# Patient Record
Sex: Male | Born: 1970 | Race: White | Hispanic: No | Smoking: Current every day smoker
Health system: Southern US, Community
[De-identification: ages and names within clinical notes are randomized; demographics above are authoritative.]

## PROBLEM LIST (undated history)

## (undated) DIAGNOSIS — E119 Type 2 diabetes mellitus without complications: Secondary | ICD-10-CM

## (undated) DIAGNOSIS — D649 Anemia, unspecified: Secondary | ICD-10-CM

## (undated) DIAGNOSIS — I10 Essential (primary) hypertension: Secondary | ICD-10-CM

## (undated) DIAGNOSIS — I219 Acute myocardial infarction, unspecified: Secondary | ICD-10-CM

## (undated) DIAGNOSIS — N529 Male erectile dysfunction, unspecified: Secondary | ICD-10-CM

## (undated) DIAGNOSIS — E785 Hyperlipidemia, unspecified: Secondary | ICD-10-CM

## (undated) DIAGNOSIS — Z7902 Long term (current) use of antithrombotics/antiplatelets: Secondary | ICD-10-CM

## (undated) DIAGNOSIS — S0990XA Unspecified injury of head, initial encounter: Secondary | ICD-10-CM

## (undated) DIAGNOSIS — F339 Major depressive disorder, recurrent, unspecified: Secondary | ICD-10-CM

## (undated) DIAGNOSIS — M1990 Unspecified osteoarthritis, unspecified site: Secondary | ICD-10-CM

## (undated) DIAGNOSIS — I639 Cerebral infarction, unspecified: Secondary | ICD-10-CM

## (undated) DIAGNOSIS — I2510 Atherosclerotic heart disease of native coronary artery without angina pectoris: Secondary | ICD-10-CM

## (undated) DIAGNOSIS — Z720 Tobacco use: Secondary | ICD-10-CM

## (undated) DIAGNOSIS — I5189 Other ill-defined heart diseases: Secondary | ICD-10-CM

## (undated) DIAGNOSIS — D3502 Benign neoplasm of left adrenal gland: Secondary | ICD-10-CM

## (undated) DIAGNOSIS — I469 Cardiac arrest, cause unspecified: Secondary | ICD-10-CM

## (undated) DIAGNOSIS — F419 Anxiety disorder, unspecified: Secondary | ICD-10-CM

## (undated) DIAGNOSIS — S61512A Laceration without foreign body of left wrist, initial encounter: Secondary | ICD-10-CM

## (undated) DIAGNOSIS — I209 Angina pectoris, unspecified: Secondary | ICD-10-CM

## (undated) DIAGNOSIS — K2970 Gastritis, unspecified, without bleeding: Secondary | ICD-10-CM

## (undated) DIAGNOSIS — M069 Rheumatoid arthritis, unspecified: Secondary | ICD-10-CM

## (undated) DIAGNOSIS — F1910 Other psychoactive substance abuse, uncomplicated: Secondary | ICD-10-CM

## (undated) DIAGNOSIS — F909 Attention-deficit hyperactivity disorder, unspecified type: Secondary | ICD-10-CM

## (undated) DIAGNOSIS — K5790 Diverticulosis of intestine, part unspecified, without perforation or abscess without bleeding: Secondary | ICD-10-CM

## (undated) DIAGNOSIS — D72829 Elevated white blood cell count, unspecified: Secondary | ICD-10-CM

## (undated) DIAGNOSIS — F1020 Alcohol dependence, uncomplicated: Secondary | ICD-10-CM

## (undated) DIAGNOSIS — Z8614 Personal history of Methicillin resistant Staphylococcus aureus infection: Secondary | ICD-10-CM

## (undated) DIAGNOSIS — K4020 Bilateral inguinal hernia, without obstruction or gangrene, not specified as recurrent: Secondary | ICD-10-CM

## (undated) DIAGNOSIS — K429 Umbilical hernia without obstruction or gangrene: Secondary | ICD-10-CM

## (undated) HISTORY — PX: HERNIA REPAIR: SHX51

## (undated) HISTORY — DX: Hyperlipidemia, unspecified: E78.5

## (undated) HISTORY — DX: Essential (primary) hypertension: I10

## (undated) HISTORY — DX: Anxiety disorder, unspecified: F41.9

## (undated) HISTORY — DX: Unspecified osteoarthritis, unspecified site: M19.90

## (undated) HISTORY — DX: Acute myocardial infarction, unspecified: I21.9

## (undated) HISTORY — PX: TONSILLECTOMY: SUR1361

## (undated) HISTORY — PX: FRACTURE SURGERY: SHX138

---

## 2003-08-28 DIAGNOSIS — Z9889 Other specified postprocedural states: Secondary | ICD-10-CM

## 2003-08-28 HISTORY — DX: Other specified postprocedural states: Z98.890

## 2006-08-27 DIAGNOSIS — I469 Cardiac arrest, cause unspecified: Secondary | ICD-10-CM

## 2006-08-27 HISTORY — PX: HERNIA REPAIR: SHX51

## 2006-08-27 HISTORY — PX: INGUINAL HERNIA REPAIR: SHX194

## 2006-08-27 HISTORY — PX: CAROTID STENT: SHX1301

## 2006-08-27 HISTORY — PX: CORONARY ANGIOPLASTY WITH STENT PLACEMENT: SHX49

## 2006-08-27 HISTORY — DX: Cardiac arrest, cause unspecified: I46.9

## 2006-08-27 HISTORY — PX: PERICARDIAL WINDOW: SHX2213

## 2006-08-27 HISTORY — PX: EXPLORATION POST OPERATIVE OPEN HEART: SHX5061

## 2011-04-26 HISTORY — PX: CORONARY ANGIOPLASTY WITH STENT PLACEMENT: SHX49

## 2011-08-02 DIAGNOSIS — G8929 Other chronic pain: Secondary | ICD-10-CM | POA: Insufficient documentation

## 2012-08-27 HISTORY — PX: UMBILICAL HERNIA REPAIR: SHX196

## 2013-12-21 DIAGNOSIS — D72829 Elevated white blood cell count, unspecified: Secondary | ICD-10-CM | POA: Insufficient documentation

## 2013-12-21 DIAGNOSIS — D649 Anemia, unspecified: Secondary | ICD-10-CM | POA: Insufficient documentation

## 2013-12-21 DIAGNOSIS — G8929 Other chronic pain: Secondary | ICD-10-CM | POA: Insufficient documentation

## 2013-12-22 DIAGNOSIS — F1090 Alcohol use, unspecified, uncomplicated: Secondary | ICD-10-CM | POA: Insufficient documentation

## 2013-12-22 DIAGNOSIS — J9600 Acute respiratory failure, unspecified whether with hypoxia or hypercapnia: Secondary | ICD-10-CM | POA: Insufficient documentation

## 2013-12-22 DIAGNOSIS — S0990XA Unspecified injury of head, initial encounter: Secondary | ICD-10-CM | POA: Insufficient documentation

## 2013-12-22 DIAGNOSIS — F1020 Alcohol dependence, uncomplicated: Secondary | ICD-10-CM | POA: Insufficient documentation

## 2015-10-20 DIAGNOSIS — E1169 Type 2 diabetes mellitus with other specified complication: Secondary | ICD-10-CM | POA: Insufficient documentation

## 2015-10-20 DIAGNOSIS — E782 Mixed hyperlipidemia: Secondary | ICD-10-CM | POA: Insufficient documentation

## 2015-12-13 DIAGNOSIS — R413 Other amnesia: Secondary | ICD-10-CM | POA: Insufficient documentation

## 2015-12-21 DIAGNOSIS — M25572 Pain in left ankle and joints of left foot: Secondary | ICD-10-CM | POA: Insufficient documentation

## 2015-12-21 DIAGNOSIS — M25571 Pain in right ankle and joints of right foot: Secondary | ICD-10-CM | POA: Insufficient documentation

## 2016-10-03 DIAGNOSIS — F902 Attention-deficit hyperactivity disorder, combined type: Secondary | ICD-10-CM | POA: Insufficient documentation

## 2017-09-17 DIAGNOSIS — M21961 Unspecified acquired deformity of right lower leg: Secondary | ICD-10-CM | POA: Insufficient documentation

## 2017-10-04 DIAGNOSIS — F332 Major depressive disorder, recurrent severe without psychotic features: Secondary | ICD-10-CM | POA: Insufficient documentation

## 2017-10-04 DIAGNOSIS — S61512A Laceration without foreign body of left wrist, initial encounter: Secondary | ICD-10-CM | POA: Insufficient documentation

## 2017-10-04 DIAGNOSIS — F1520 Other stimulant dependence, uncomplicated: Secondary | ICD-10-CM | POA: Insufficient documentation

## 2018-05-22 DIAGNOSIS — Z91148 Patient's other noncompliance with medication regimen for other reason: Secondary | ICD-10-CM | POA: Insufficient documentation

## 2018-07-29 DIAGNOSIS — R41840 Attention and concentration deficit: Secondary | ICD-10-CM | POA: Insufficient documentation

## 2018-09-11 ENCOUNTER — Other Ambulatory Visit: Payer: Self-pay

## 2018-09-11 ENCOUNTER — Encounter: Payer: Self-pay | Admitting: *Deleted

## 2018-09-11 ENCOUNTER — Emergency Department
Admission: EM | Admit: 2018-09-11 | Discharge: 2018-09-12 | Disposition: A | Discharge status: Other Acute Inpatient Hospital | Payer: Self-pay | Attending: Emergency Medicine | Admitting: Emergency Medicine

## 2018-09-11 DIAGNOSIS — F1510 Other stimulant abuse, uncomplicated: Secondary | ICD-10-CM

## 2018-09-11 DIAGNOSIS — T43604A Poisoning by unspecified psychostimulants, undetermined, initial encounter: Secondary | ICD-10-CM

## 2018-09-11 DIAGNOSIS — F1110 Opioid abuse, uncomplicated: Secondary | ICD-10-CM

## 2018-09-11 DIAGNOSIS — Z7902 Long term (current) use of antithrombotics/antiplatelets: Secondary | ICD-10-CM | POA: Insufficient documentation

## 2018-09-11 DIAGNOSIS — Z7984 Long term (current) use of oral hypoglycemic drugs: Secondary | ICD-10-CM | POA: Insufficient documentation

## 2018-09-11 DIAGNOSIS — Z79899 Other long term (current) drug therapy: Secondary | ICD-10-CM | POA: Insufficient documentation

## 2018-09-11 DIAGNOSIS — F1590 Other stimulant use, unspecified, uncomplicated: Secondary | ICD-10-CM | POA: Insufficient documentation

## 2018-09-11 DIAGNOSIS — F15950 Other stimulant use, unspecified with stimulant-induced psychotic disorder with delusions: Secondary | ICD-10-CM

## 2018-09-11 DIAGNOSIS — F17210 Nicotine dependence, cigarettes, uncomplicated: Secondary | ICD-10-CM | POA: Insufficient documentation

## 2018-09-11 DIAGNOSIS — T50901A Poisoning by unspecified drugs, medicaments and biological substances, accidental (unintentional), initial encounter: Secondary | ICD-10-CM

## 2018-09-11 HISTORY — DX: Cardiac arrest, cause unspecified: I46.9

## 2018-09-11 HISTORY — DX: Acute myocardial infarction, unspecified: I21.9

## 2018-09-11 LAB — COMPREHENSIVE METABOLIC PANEL
ALBUMIN: 4.3 g/dL (ref 3.5–5.0)
ALT: 24 U/L (ref 0–44)
ANION GAP: 3 — AB (ref 5–15)
AST: 42 U/L — ABNORMAL HIGH (ref 15–41)
Alkaline Phosphatase: 71 U/L (ref 38–126)
BUN: 23 mg/dL — ABNORMAL HIGH (ref 6–20)
CO2: 29 mmol/L (ref 22–32)
Calcium: 8.7 mg/dL — ABNORMAL LOW (ref 8.9–10.3)
Chloride: 113 mmol/L — ABNORMAL HIGH (ref 98–111)
Creatinine, Ser: 0.67 mg/dL (ref 0.61–1.24)
GFR calc non Af Amer: >60 mL/min (ref >60)
Glucose, Bld: 139 mg/dL — ABNORMAL HIGH (ref 70–99)
Potassium: 3.7 mmol/L (ref 3.5–5.1)
Sodium: 145 mmol/L (ref 135–145)
TOTAL PROTEIN: 6.6 g/dL (ref 6.5–8.1)
Total Bilirubin: 0.6 mg/dL (ref 0.3–1.2)

## 2018-09-11 LAB — CBC
HCT: 39.8 % (ref 39.0–52.0)
Hemoglobin: 13.4 g/dL (ref 13.0–17.0)
MCH: 32.5 pg (ref 26.0–34.0)
MCHC: 33.7 g/dL (ref 30.0–36.0)
MCV: 96.6 fL (ref 80.0–100.0)
PLATELETS: 296 10*3/uL (ref 150–400)
RBC: 4.12 MIL/uL — ABNORMAL LOW (ref 4.22–5.81)
RDW: 12.9 % (ref 11.5–15.5)
WBC: 9.3 10*3/uL (ref 4.0–10.5)
nRBC: 0 % (ref 0.0–0.2)

## 2018-09-11 LAB — ETHANOL: Alcohol, Ethyl (B): <10 mg/dL (ref <10)

## 2018-09-11 LAB — ACETAMINOPHEN LEVEL: Acetaminophen (Tylenol), Serum: <10 ug/mL — ABNORMAL LOW (ref 10–30)

## 2018-09-11 LAB — SALICYLATE LEVEL: Salicylate Lvl: <7 mg/dL (ref 2.8–30.0)

## 2018-09-11 MED ORDER — LORAZEPAM 2 MG PO TABS
2.0000 mg | ORAL_TABLET | Freq: Once | ORAL | Status: AC
  Administered 2018-09-11: 2 mg via ORAL
  Filled 2018-09-11: qty 1

## 2018-09-11 NOTE — ED Provider Notes (Signed)
[facility] Emergency Department Provider Note  ____________________________________________  Time seen: Approximately 8:09 PM  I have reviewed the triage vital signs and the nursing notes.   HISTORY  Chief Complaint Drug Overdose    HPI Tyler Martinez is a 48 y.o. male with a history of cardiac arrest MI ADHD  who is brought to the ED by his brother due to sedation at home.  Patient reports that he had not been taking his medications in several days, and for the last 2 to 3 days has had insomnia so today he took 4 of his ADHD medicine, dextra methamphetamine, to try to sleep.  His brother had reported that the patient was sedated and difficult to wake up at home.  Currently the patient feels agitated and like he is having racing disorganized thoughts.     Past Medical History:  Diagnosis Date  . Cardiac arrest ([place]) 2008  . MI (myocardial infarction) ([place])      There are no active problems to display for this patient.    Past Surgical History:  Procedure Laterality Date  . CAROTID STENT  2008  . EXPLORATION POST OPERATIVE OPEN HEART  2008     Prior to Admission medications   Medication Sig Start Date End Date Taking? Authorizing Provider  amphetamine-dextroamphetamine (ADDERALL) 20 MG tablet Take 20 mg by mouth 2 (two) times daily.   Yes [provider]  atorvastatin (LIPITOR) 40 MG tablet Take 40 mg by mouth daily.   Yes [provider]  buPROPion (WELLBUTRIN XL) 300 MG 24 hr tablet Take 300 mg by mouth daily.   Yes [provider]  carvedilol (COREG) 12.5 MG tablet Take 12.5 mg by mouth daily.   Yes [provider]  clopidogrel (PLAVIX) 75 MG tablet Take 75 mg by mouth daily.   Yes [provider]  gabapentin (NEURONTIN) 800 MG tablet Take 800 mg by mouth 3 (three) times daily.   Yes [provider]  glimepiride (AMARYL) 4 MG tablet Take 4 mg by mouth daily.   Yes [provider]   lisinopril-hydrochlorothiazide (PRINZIDE,ZESTORETIC) 20-25 MG tablet Take 1 tablet by mouth daily.   Yes [provider]  metFORMIN (GLUCOPHAGE) 500 MG tablet Take 1,000 mg by mouth 2 (two) times daily with a meal.   Yes [provider]  QUEtiapine (SEROQUEL) 100 MG tablet Take 100 mg by mouth at bedtime.   Yes [provider]     Allergies Patient has no allergy information on record.   History reviewed. No pertinent family history.  Social History Social History   Tobacco Use  . Smoking status: Current Every Day Smoker    Packs/day: 3.00    Types: Cigarettes  . Smokeless tobacco: Never Used  Substance Use Topics  . Alcohol use: Never    Frequency: Never  . Drug use: Never    Review of Systems  Constitutional:   No fever or chills.  ENT:   No sore throat. No rhinorrhea. Cardiovascular:   No chest pain or syncope. Respiratory:   No dyspnea or cough. Gastrointestinal:   Negative for abdominal pain, vomiting and diarrhea.  Musculoskeletal:   Chronic back pain. All other systems reviewed and are negative except as documented above in ROS and HPI.  ____________________________________________   PHYSICAL EXAM:  VITAL SIGNS: ED Triage Vitals  Enc Vitals Group     BP --      Pulse --      Resp --  Temp --      Temp src --      SpO2 --      Weight 09/11/18 1758 208 lb (94.3 kg)     Height 09/11/18 1758 6' (1.829 m)     Head Circumference --      Peak Flow --      Pain Score 09/11/18 1757 10     Pain Loc --      Pain Edu? --      Excl. in [place]? --     Vital signs reviewed, nursing assessments reviewed.   Constitutional:   Alert and oriented. Non-toxic appearance. Eyes:   Conjunctivae are normal. EOMI. PERRL. ENT      Head:   Normocephalic and atraumatic.      Nose:   No congestion/rhinnorhea.       Mouth/Throat:   MMM, no pharyngeal erythema. No peritonsillar mass.       Neck:   No meningismus. Full  ROM. Hematological/Lymphatic/Immunilogical:   No cervical lymphadenopathy. Cardiovascular:   RRR. Symmetric bilateral radial and DP pulses.  No murmurs. Cap refill less than 2 seconds. Respiratory:   Normal respiratory effort without tachypnea/retractions. Breath sounds are clear and equal bilaterally. No wheezes/rales/rhonchi. Gastrointestinal:   Soft and nontender. Non distended. There is no CVA tenderness.  No rebound, rigidity, or guarding. Genitourinary:   deferred Musculoskeletal:   Normal range of motion in all extremities. No joint effusions.  No lower extremity tenderness.  No edema. Neurologic:   Somewhat pressured speech.  Normal language.  Restless. Motor grossly intact.  Skin:    Skin is warm, dry and intact. No rash noted.  No petechiae, purpura, or bullae.  ____________________________________________    LABS (pertinent positives/negatives) (all labs ordered are listed, but only abnormal results are displayed) Labs Reviewed  COMPREHENSIVE METABOLIC PANEL - Abnormal; Notable for the following components:      Result Value   Chloride 113 (*)    Glucose, Bld 139 (*)    BUN 23 (*)    Calcium 8.7 (*)    AST 42 (*)    Anion gap 3 (*)    All other components within normal limits  ACETAMINOPHEN LEVEL - Abnormal; Notable for the following components:   Acetaminophen (Tylenol), Serum <10 (*)    All other components within normal limits  CBC - Abnormal; Notable for the following components:   RBC 4.12 (*)    All other components within normal limits  ETHANOL  SALICYLATE LEVEL  URINE DRUG SCREEN, QUALITATIVE ([facility] ONLY)   ____________________________________________   EKG    ____________________________________________    RADIOLOGY  No results found.  ____________________________________________   PROCEDURES Procedures  ____________________________________________    CLINICAL IMPRESSION / ASSESSMENT AND PLAN / ED COURSE  Pertinent labs & imaging  results that were available during my care of the patient were reviewed by me and considered in my medical decision making (see chart for details).    Patient presents with agitation, possibly an overdose of his medications, possibly deterioration of his psychiatric disease due to medication noncompliance.  Not a danger to himself or others.  I do not think he is committable at this time, but will obtain a psych consult.      ____________________________________________   FINAL CLINICAL IMPRESSION(S) / ED DIAGNOSES    Final diagnoses:  Central nervous system stimulant overdose, undetermined intent, initial encounter [facility])     ED Discharge Orders    None      Portions of this  note were generated with dragon dictation software. Dictation errors may occur despite best attempts at proofreading.   Carrie Mew, MD 09/11/18 2236

## 2018-09-11 NOTE — ED Triage Notes (Signed)
Pt to ED reporting he has not slept in four days and he took 4 Seroquel pills today at 1430. Pt with decreased level of consciousness when brother arrived home. Brother reports having performed and sternal rub to wake patient. Pt is figiting in triage and when asked reports this is what happens when his blood sugar is high and heart is racing.   Hx of behavioral health stated but never seen in this ED.

## 2018-09-11 NOTE — ED Notes (Signed)
Pt pacing around in his room, moving the bed around and banging on things. Pt reports he is "going to have a panic attack". Pt states "ya'll need to give me some medicines to knock me out".  Will inform MD.

## 2018-09-11 NOTE — ED Notes (Signed)
Patient transferred to room 22, He is oriented, asking for food, Patient is cooperative, will continue to monitor.

## 2018-09-11 NOTE — ED Notes (Signed)
Gave food tray with juice. 

## 2018-09-12 ENCOUNTER — Inpatient Hospital Stay
Admission: AD | Admit: 2018-09-12 | Discharge: 2018-09-15 | DRG: 897 | Disposition: A | Discharge status: Home / Self Care | Payer: No Typology Code available for payment source | Source: Intra-hospital | Attending: Psychiatry | Admitting: Psychiatry

## 2018-09-12 DIAGNOSIS — T50901A Poisoning by unspecified drugs, medicaments and biological substances, accidental (unintentional), initial encounter: Secondary | ICD-10-CM

## 2018-09-12 DIAGNOSIS — F1510 Other stimulant abuse, uncomplicated: Secondary | ICD-10-CM

## 2018-09-12 DIAGNOSIS — Z590 Homelessness: Secondary | ICD-10-CM

## 2018-09-12 DIAGNOSIS — I252 Old myocardial infarction: Secondary | ICD-10-CM

## 2018-09-12 DIAGNOSIS — Z7902 Long term (current) use of antithrombotics/antiplatelets: Secondary | ICD-10-CM

## 2018-09-12 DIAGNOSIS — F15950 Other stimulant use, unspecified with stimulant-induced psychotic disorder with delusions: Secondary | ICD-10-CM | POA: Diagnosis not present

## 2018-09-12 DIAGNOSIS — E119 Type 2 diabetes mellitus without complications: Secondary | ICD-10-CM | POA: Diagnosis present

## 2018-09-12 DIAGNOSIS — E785 Hyperlipidemia, unspecified: Secondary | ICD-10-CM | POA: Diagnosis present

## 2018-09-12 DIAGNOSIS — I10 Essential (primary) hypertension: Secondary | ICD-10-CM | POA: Diagnosis present

## 2018-09-12 DIAGNOSIS — Z5181 Encounter for therapeutic drug level monitoring: Secondary | ICD-10-CM

## 2018-09-12 DIAGNOSIS — F15150 Other stimulant abuse with stimulant-induced psychotic disorder with delusions: Principal | ICD-10-CM | POA: Diagnosis present

## 2018-09-12 DIAGNOSIS — Z79899 Other long term (current) drug therapy: Secondary | ICD-10-CM | POA: Diagnosis not present

## 2018-09-12 DIAGNOSIS — F419 Anxiety disorder, unspecified: Secondary | ICD-10-CM | POA: Diagnosis present

## 2018-09-12 DIAGNOSIS — F17210 Nicotine dependence, cigarettes, uncomplicated: Secondary | ICD-10-CM | POA: Diagnosis present

## 2018-09-12 DIAGNOSIS — Z95820 Peripheral vascular angioplasty status with implants and grafts: Secondary | ICD-10-CM

## 2018-09-12 DIAGNOSIS — Z7984 Long term (current) use of oral hypoglycemic drugs: Secondary | ICD-10-CM

## 2018-09-12 DIAGNOSIS — Z8674 Personal history of sudden cardiac arrest: Secondary | ICD-10-CM

## 2018-09-12 DIAGNOSIS — F1110 Opioid abuse, uncomplicated: Secondary | ICD-10-CM

## 2018-09-12 LAB — URINE DRUG SCREEN, QUALITATIVE (ARMC ONLY)
Amphetamines, Ur Screen: POSITIVE — AB
Barbiturates, Ur Screen: NOT DETECTED
Benzodiazepine, Ur Scrn: POSITIVE — AB
Cannabinoid 50 Ng, Ur ~~LOC~~: NOT DETECTED
Cocaine Metabolite,Ur ~~LOC~~: NOT DETECTED
MDMA (Ecstasy)Ur Screen: NOT DETECTED
Methadone Scn, Ur: NOT DETECTED
Opiate, Ur Screen: NOT DETECTED
Phencyclidine (PCP) Ur S: NOT DETECTED
Tricyclic, Ur Screen: NOT DETECTED

## 2018-09-12 MED ORDER — LISINOPRIL 20 MG PO TABS
20.0000 mg | ORAL_TABLET | Freq: Every day | ORAL | Status: DC
  Administered 2018-09-12 – 2018-09-15 (×4): 20 mg via ORAL
  Filled 2018-09-12 (×4): qty 1

## 2018-09-12 MED ORDER — ALUM & MAG HYDROXIDE-SIMETH 200-200-20 MG/5ML PO SUSP: 30.0000 mL | ORAL | Status: DC | PRN

## 2018-09-12 MED ORDER — METFORMIN HCL 500 MG PO TABS
1000.0000 mg | ORAL_TABLET | Freq: Two times a day (BID) | ORAL | Status: DC
  Administered 2018-09-13 – 2018-09-15 (×5): 1000 mg via ORAL
  Filled 2018-09-12 (×5): qty 2

## 2018-09-12 MED ORDER — CARVEDILOL 12.5 MG PO TABS
12.5000 mg | ORAL_TABLET | Freq: Every day | ORAL | Status: DC
  Administered 2018-09-12 – 2018-09-15 (×4): 12.5 mg via ORAL
  Filled 2018-09-12 (×4): qty 1

## 2018-09-12 MED ORDER — HALOPERIDOL LACTATE 5 MG/ML IJ SOLN
5.0000 mg | Freq: Once | INTRAMUSCULAR | Status: AC
  Administered 2018-09-12: 5 mg via INTRAMUSCULAR
  Filled 2018-09-12: qty 1

## 2018-09-12 MED ORDER — LISINOPRIL-HYDROCHLOROTHIAZIDE 20-25 MG PO TABS: 1.0000 | ORAL_TABLET | Freq: Every day | ORAL | Status: DC

## 2018-09-12 MED ORDER — ATORVASTATIN CALCIUM 20 MG PO TABS
40.0000 mg | ORAL_TABLET | Freq: Every day | ORAL | Status: DC
  Administered 2018-09-12 – 2018-09-15 (×4): 40 mg via ORAL
  Filled 2018-09-12 (×4): qty 2

## 2018-09-12 MED ORDER — CLOPIDOGREL BISULFATE 75 MG PO TABS
75.0000 mg | ORAL_TABLET | Freq: Every day | ORAL | Status: DC
  Administered 2018-09-12 – 2018-09-15 (×4): 75 mg via ORAL
  Filled 2018-09-12 (×4): qty 1

## 2018-09-12 MED ORDER — GLIMEPIRIDE 4 MG PO TABS
4.0000 mg | ORAL_TABLET | Freq: Every day | ORAL | Status: DC
  Administered 2018-09-13 – 2018-09-15 (×3): 4 mg via ORAL
  Filled 2018-09-12 (×4): qty 1

## 2018-09-12 MED ORDER — MAGNESIUM HYDROXIDE 400 MG/5ML PO SUSP: 30.0000 mL | Freq: Every day | ORAL | Status: DC | PRN

## 2018-09-12 MED ORDER — QUETIAPINE FUMARATE 100 MG PO TABS
100.0000 mg | ORAL_TABLET | Freq: Every day | ORAL | Status: DC
  Administered 2018-09-12 – 2018-09-14 (×3): 100 mg via ORAL
  Filled 2018-09-12 (×3): qty 1

## 2018-09-12 MED ORDER — NICOTINE 21 MG/24HR TD PT24
21.0000 mg | MEDICATED_PATCH | Freq: Every day | TRANSDERMAL | Status: DC
  Administered 2018-09-12 – 2018-09-15 (×5): 21 mg via TRANSDERMAL
  Filled 2018-09-12 (×5): qty 1

## 2018-09-12 MED ORDER — QUETIAPINE FUMARATE 25 MG PO TABS
50.0000 mg | ORAL_TABLET | Freq: Three times a day (TID) | ORAL | Status: DC | PRN
  Administered 2018-09-13 – 2018-09-14 (×4): 50 mg via ORAL
  Filled 2018-09-12 (×4): qty 2

## 2018-09-12 MED ORDER — LORAZEPAM 2 MG/ML IJ SOLN
2.0000 mg | Freq: Once | INTRAMUSCULAR | Status: AC
  Administered 2018-09-12: 2 mg via INTRAMUSCULAR
  Filled 2018-09-12: qty 1

## 2018-09-12 MED ORDER — ACETAMINOPHEN 325 MG PO TABS
650.0000 mg | ORAL_TABLET | Freq: Four times a day (QID) | ORAL | Status: DC | PRN
  Administered 2018-09-12 – 2018-09-14 (×4): 650 mg via ORAL
  Filled 2018-09-12 (×4): qty 2

## 2018-09-12 MED ORDER — HYDROCHLOROTHIAZIDE 25 MG PO TABS
25.0000 mg | ORAL_TABLET | Freq: Every day | ORAL | Status: DC
  Administered 2018-09-12 – 2018-09-15 (×4): 25 mg via ORAL
  Filled 2018-09-12 (×4): qty 1

## 2018-09-12 NOTE — BH Assessment (Addendum)
Patient is to be admitted to [facility] by Dr. Weber Cooks.  Attending Physician will be Dr. Weber Cooks.   Patient has been assigned to room 306, by Short Nurse T'Yawn.   Intake Paper Work has been signed and placed on patient chart.  ER staff is aware of the admission:  LouAnn, ER Secretary    Dr. Cinda Quest, ER MD   Berton Lan, Patient's Nurse   Johnnye Lana, Patient Access.  *Per [facility] BMU Charge RN, pt can be transported to unit after 7:00pm.

## 2018-09-12 NOTE — ED Notes (Addendum)
Pt transferred to [facility] in wheelchair via NT and Officer. Pt stable NAD. Patient belongings are with his brother. No issues.

## 2018-09-12 NOTE — ED Notes (Signed)
Snack and beverage given. 

## 2018-09-12 NOTE — ED Notes (Signed)
Report to include Situation, Background, Assessment, and Recommendations received from [facility]. Patient alert and oriented, warm and dry, in no acute distress. Patient denies SI, HI, AVH and pain. Patient made aware of Q15 minute rounds and security cameras for their safety. Patient instructed to come to me with needs or concerns.

## 2018-09-12 NOTE — BH Assessment (Signed)
Assessment Note  Tyler Martinez is an 48 y.o. male who was transported to the ED by his brother after being found sedated when his brother arrived at home. Pt reportedly took 4 pills of his ADHD medication because he had not been able to sleep within the last 2-3 days (per pt report). Pt was restless while speaking with this Probation officer and reported having body aches. Pt denied SI/HI. Pt denied AVH. Pt also denied use of alcohol and/or any other drugs.     Diagnosis: Amphetamine Use Disorder, Unspecified  Past Medical History:  Past Medical History:  Diagnosis Date  . Cardiac arrest ([place]) 2008  . MI (myocardial infarction) [facility])     Past Surgical History:  Procedure Laterality Date  . CAROTID STENT  2008  . EXPLORATION POST OPERATIVE OPEN HEART  2008    Family History: History reviewed. No pertinent family history.  Social History:  reports that he has been smoking cigarettes. He has been smoking about 3.00 packs per day. He has never used smokeless tobacco. He reports that he does not drink alcohol or use drugs.  Additional Social History:  Alcohol / Drug Use Pain Medications: See MAR Prescriptions: See MAR Over the Counter: See MAR History of alcohol / drug use?: No history of alcohol / drug abuse(Denies) Negative Consequences of Use: (N/A) Withdrawal Symptoms: (N/A)  CIWA: CIWA-Ar BP: 115/89 Pulse Rate: 80 COWS:    Allergies: Not on File  Home Medications: (Not in a hospital admission)   OB/GYN Status:  No LMP for male patient.  General Assessment Data Location of Assessment: [facility] ED TTS Assessment: In system Is this a Tele or Face-to-Face Assessment?: Face-to-Face Is this an Initial Assessment or a Re-assessment for this encounter?: Initial Assessment Patient Accompanied by:: N/A Language Other than English: No Living Arrangements: Other (Comment)(Private Living) What gender do you identify as?: Male Marital status: Single Maiden name: n/a Pregnancy Status:  No Living Arrangements: Other relatives(Brother) Can pt return to current living arrangement?: Yes Admission Status: Voluntary Is patient capable of signing voluntary admission?: Yes Referral Source: Self/Family/Friend Insurance type: None  Medical Screening Exam ([place]) Medical Exam completed: Yes  Crisis Care Plan Living Arrangements: Other relatives(Brother) Legal Guardian: Other:(Self) Name of Psychiatrist: None Reported Name of Therapist: None Reported  Education Status Is patient currently in school?: No Is the patient employed, unemployed or receiving disability?: Receiving disability income  Risk to self with the past 6 months Suicidal Ideation: No Has patient been a risk to self within the past 6 months prior to admission? : No Suicidal Intent: No Has patient had any suicidal intent within the past 6 months prior to admission? : No Is patient at risk for suicide?: No Suicidal Plan?: No Has patient had any suicidal plan within the past 6 months prior to admission? : No Access to Means: No What has been your use of drugs/alcohol within the last 12 months?: Adderall Previous Attempts/Gestures: No How many times?: 0 Other Self Harm Risks: None Reported Triggers for Past Attempts: None known Intentional Self Injurious Behavior: None Family Suicide History: No Recent stressful life event(s): Other (Comment)(Denies) Persecutory voices/beliefs?: No Depression: No(Denies) Depression Symptoms: (None Reported) Substance abuse history and/or treatment for substance abuse?: Yes(Adderall (Prescribed)) Suicide prevention information given to non-admitted patients: Not applicable  Risk to Others within the past 6 months Homicidal Ideation: No Does patient have any lifetime risk of violence toward others beyond the six months prior to admission? : No Thoughts of Harm to Others:  No Current Homicidal Intent: No Current Homicidal Plan: No Access to Homicidal Means:  No Identified Victim: None Reported History of harm to others?: No Assessment of Violence: None Noted Violent Behavior Description: None Reported Does patient have access to weapons?: No Criminal Charges Pending?: No Does patient have a court date: No Is patient on probation?: No  Psychosis Hallucinations: None noted Delusions: None noted  Mental Status Report Appearance/Hygiene: In scrubs Eye Contact: Poor Motor Activity: Restlessness Speech: Logical/coherent, Slurred Level of Consciousness: Restless, Drowsy Mood: Pleasant Affect: Flat Anxiety Level: Moderate Thought Processes: Relevant Judgement: Partial Orientation: Person, Time, Situation, Appropriate for developmental age(Pt thought he was in [employer]) Obsessive Compulsive Thoughts/Behaviors: None  Cognitive Functioning Concentration: Poor Memory: Recent Impaired Is patient IDD: No Insight: Poor Impulse Control: Poor Appetite: Good Have you had any weight changes? : No Change Sleep: Decreased Total Hours of Sleep: 2(within the last 2-3 days) Vegetative Symptoms: None  ADLScreening [facility] Assessment Services) Patient's cognitive ability adequate to safely complete daily activities?: Yes Patient able to express need for assistance with ADLs?: Yes Independently performs ADLs?: Yes (appropriate for developmental age)  Prior Inpatient Therapy Prior Inpatient Therapy: No(Denied)  Prior Outpatient Therapy Prior Outpatient Therapy: No Does patient have an ACCT team?: No Does patient have Intensive In-House Services?  : No Does patient have [facility] services? : No Does patient have P4CC services?: No  ADL Screening (condition at time of admission) Patient's cognitive ability adequate to safely complete daily activities?: Yes Patient able to express need for assistance with ADLs?: Yes Independently performs ADLs?: Yes (appropriate for developmental age)       Abuse/Neglect Assessment (Assessment to be complete  while patient is alone) Abuse/Neglect Assessment Can Be Completed: Yes Physical Abuse: Denies Verbal Abuse: Denies Sexual Abuse: Denies Exploitation of patient/patient's resources: Denies Self-Neglect: Denies Values / Beliefs Cultural Requests During Hospitalization: None Spiritual Requests During Hospitalization: None Consults Spiritual Care Consult Needed: No Social Work Consult Needed: No Regulatory affairs officer (For Healthcare) Does Patient Have a Medical Advance Directive?: No Would patient like information on creating a medical advance directive?: No - Patient declined       Child/Adolescent Assessment Running Away Risk: (Patient is an adult)  Disposition:  Disposition Initial Assessment Completed for this Encounter: Yes Disposition of Patient: (Pending Psych Consult) Patient refused recommended treatment: No  On Site Evaluation by:   Reviewed with Physician:    Frederich Cha 09/12/2018 2:06 PM

## 2018-09-12 NOTE — ED Notes (Signed)
Patient transferred via nurse to room 3 from the quad, received report, patient is calm and cooperative.

## 2018-09-12 NOTE — ED Notes (Signed)
Dr. Weber Cooks talking to the patient, patient is cooperative, no behavioral issues noted.

## 2018-09-12 NOTE — Consult Note (Signed)
[facility] Face-to-Face Psychiatry Consult   Reason for Consult: Consult for this 48 year old man brought to the emergency room after being found unconscious at home Referring Physician: Corky Downs Patient Identification: Tyler Martinez MRN:  [id number] Principal Diagnosis: Amphetamine and psychostimulant-induced psychosis with delusions ([place]) Diagnosis:  Principal Problem:   Amphetamine and psychostimulant-induced psychosis with delusions ([place]) Active Problems:   Opiate abuse, episodic ([place])   Amphetamine abuse ([place])   Overdose   Total Time spent with patient: 1 hour  Subjective:   Tyler Martinez is a 48 y.o. male patient admitted with "I could not sleep".  HPI: Patient seen chart reviewed.  Also spoke with his brother.  58 year old man was found by his brother at home unconscious and unarousable.  Patient has gradually woken up here in the emergency room.  He says that he had gone out with his "fianc" and been in in a motel for 4 days and had not slept for the entire time.  Patient refuses to offer any explanation for why he could not sleep.  He says afterwards he found himself wandering around outside and needed to sleep so he took 4 times his usual Seroquel dose.  Somewhere along in there the police picked him up for being intoxicated in public and took him back to his brother's house.  Brother came home and found the patient unarousable.  Patient was initially unarousable in the emergency room but now is awake.  He is still incoherent.  Cannot give a lucid history.  Extremely uncomfortable rolling around writhing on the bed.  Patient denies having abused any substances.  Denies suicidal or homicidal ideation.  Denies psychosis.  The patient's brother says that this patient has a long history of substance abuse and he strongly suspects that the patient had gotten his Adderall prescription filled taken his money from his recent check and had a 4-day binge on amphetamines.  This would be most consistent with  the current presentation.  Social history: Patient evidently does get a check but had been homeless in the area around [place].  His brother who lives locally picked him up and brought him here to [facility] to try and give him a chance to clean up.  Most of the rest of the family has nothing to do with him.  Medical history: Possible head injury the records are not currently available.  No other known medical problems except for high blood pressure  Substance abuse history: Evidently longstanding history of abuse of substances especially narcotics and alcohol.  Patient claims he has the narcotics and alcohol completely under control now.  He is still prescribed Adderall by an outpatient provider.  Looks like it suspicious for him abusing amphetamines now.  Past Psychiatric History: Patient is prescribed Seroquel apparently as a sleep medicine.  He says he has had psychiatric hospitalizations in the past.  He has at a loss to describe them in detail.  Denies suicidal history.  Risk to Self: Suicidal Ideation: No Suicidal Intent: No Is patient at risk for suicide?: No Suicidal Plan?: No Access to Means: No What has been your use of drugs/alcohol within the last 12 months?: Adderall How many times?: 0 Other Self Harm Risks: None Reported Triggers for Past Attempts: None known Intentional Self Injurious Behavior: None Risk to Others: Homicidal Ideation: No Thoughts of Harm to Others: No Current Homicidal Intent: No Current Homicidal Plan: No Access to Homicidal Means: No Identified Victim: None Reported History of harm to others?: No Assessment of Violence: None Noted  Violent Behavior Description: None Reported Does patient have access to weapons?: No Criminal Charges Pending?: No Does patient have a court date: No Prior Inpatient Therapy: Prior Inpatient Therapy: No(Denied) Prior Outpatient Therapy: Prior Outpatient Therapy: No Does patient have an ACCT team?: No Does patient  have Intensive In-House Services?  : No Does patient have [facility] services? : No Does patient have P4CC services?: No  Past Medical History:  Past Medical History:  Diagnosis Date  . Cardiac arrest ([place]) 2008  . MI (myocardial infarction) [facility])     Past Surgical History:  Procedure Laterality Date  . CAROTID STENT  2008  . EXPLORATION POST OPERATIVE OPEN HEART  2008   Family History: History reviewed. No pertinent family history. Family Psychiatric  History: None known Social History:  Social History   Substance and Sexual Activity  Alcohol Use Never  . Frequency: Never     Social History   Substance and Sexual Activity  Drug Use Never    Social History   Socioeconomic History  . Marital status: Single    Spouse name: Not on file  . Number of children: Not on file  . Years of education: Not on file  . Highest education level: Not on file  Occupational History  . Not on file  Social Needs  . Financial resource strain: Not on file  . Food insecurity:    Worry: Not on file    Inability: Not on file  . Transportation needs:    Medical: Not on file    Non-medical: Not on file  Tobacco Use  . Smoking status: Current Every Day Smoker    Packs/day: 3.00    Types: Cigarettes  . Smokeless tobacco: Never Used  Substance and Sexual Activity  . Alcohol use: Never    Frequency: Never  . Drug use: Never  . Sexual activity: Not on file  Lifestyle  . Physical activity:    Days per week: Not on file    Minutes per session: Not on file  . Stress: Not on file  Relationships  . Social connections:    Talks on phone: Not on file    Gets together: Not on file    Attends religious service: Not on file    Active member of club or organization: Not on file    Attends meetings of clubs or organizations: Not on file    Relationship status: Not on file  Other Topics Concern  . Not on file  Social History Narrative  . Not on file   Additional Social History:     Allergies:  Not on File  Labs:  Results for orders placed or performed during the hospital encounter of 09/11/18 (from the past 48 hour(s))  Comprehensive metabolic panel     Status: Abnormal   Collection Time: 09/11/18  6:37 PM  Result Value Ref Range   Sodium 145 135 - 145 mmol/L   Potassium 3.7 3.5 - 5.1 mmol/L   Chloride 113 (H) 98 - 111 mmol/L   CO2 29 22 - 32 mmol/L   Glucose, Bld 139 (H) 70 - 99 mg/dL   BUN 23 (H) 6 - 20 mg/dL   Creatinine, Ser 0.67 0.61 - 1.24 mg/dL   Calcium 8.7 (L) 8.9 - 10.3 mg/dL   Total Protein 6.6 6.5 - 8.1 g/dL   Albumin 4.3 3.5 - 5.0 g/dL   AST 42 (H) 15 - 41 U/L   ALT 24 0 - 44 U/L   Alkaline Phosphatase 71  38 - 126 U/L   Total Bilirubin 0.6 0.3 - 1.2 mg/dL   GFR calc non Af Amer >60 >60 mL/min   GFR calc Af Amer >60 >60 mL/min   Anion gap 3 (L) 5 - 15    Comment: Performed at [facility], [address] [place], [place] [address]  Ethanol     Status: None   Collection Time: 09/11/18  6:37 PM  Result Value Ref Range   Alcohol, Ethyl (B) <10 <10 mg/dL    Comment: (NOTE) Lowest detectable limit for serum alcohol is 10 mg/dL. For medical purposes only. Performed at [facility], [address] [place], [place] [address]   Salicylate level     Status: None   Collection Time: 09/11/18  6:37 PM  Result Value Ref Range   Salicylate Lvl <8.6 2.8 - 30.0 mg/dL    Comment: Performed at [facility], [address] [place], [place] [address]  Acetaminophen level     Status: Abnormal   Collection Time: 09/11/18  6:37 PM  Result Value Ref Range   Acetaminophen (Tylenol), Serum <10 (L) 10 - 30 ug/mL    Comment: (NOTE) Therapeutic concentrations vary significantly. A range of 10-30 ug/mL  may be an effective concentration for many patients. However, some  are best treated at concentrations outside of this range. Acetaminophen concentrations >150 ug/mL at 4 hours after ingestion  and >50 ug/mL at 12 hours after  ingestion are often associated with  toxic reactions. Performed at [facility], [address] [place], [place] [address]   cbc     Status: Abnormal   Collection Time: 09/11/18  6:37 PM  Result Value Ref Range   WBC 9.3 4.0 - 10.5 K/uL   RBC 4.12 (L) 4.22 - 5.81 MIL/uL   Hemoglobin 13.4 13.0 - 17.0 g/dL   HCT 39.8 39.0 - 52.0 %   MCV 96.6 80.0 - 100.0 fL   MCH 32.5 26.0 - 34.0 pg   MCHC 33.7 30.0 - 36.0 g/dL   RDW 12.9 11.5 - 15.5 %   Platelets 296 150 - 400 K/uL   nRBC 0.0 0.0 - 0.2 %    Comment: Performed at [facility], [address] [place], [place] [address]  Urine Drug Screen, Qualitative     Status: Abnormal   Collection Time: 09/12/18 10:37 AM  Result Value Ref Range   Tricyclic, Ur Screen NONE DETECTED NONE DETECTED   Amphetamines, Ur Screen POSITIVE (A) NONE DETECTED   MDMA (Ecstasy)Ur Screen NONE DETECTED NONE DETECTED   Cocaine Metabolite,Ur [place] NONE DETECTED NONE DETECTED   Opiate, Ur Screen NONE DETECTED NONE DETECTED   Phencyclidine (PCP) Ur S NONE DETECTED NONE DETECTED   Cannabinoid 50 Ng, Ur [place] NONE DETECTED NONE DETECTED   Barbiturates, Ur Screen NONE DETECTED NONE DETECTED   Benzodiazepine, Ur Scrn POSITIVE (A) NONE DETECTED   Methadone Scn, Ur NONE DETECTED NONE DETECTED    Comment: (NOTE) Tricyclics + metabolites, urine    Cutoff 1000 ng/mL Amphetamines + metabolites, urine  Cutoff 1000 ng/mL MDMA (Ecstasy), urine              Cutoff 500 ng/mL Cocaine Metabolite, urine          Cutoff 300 ng/mL Opiate + metabolites, urine        Cutoff 300 ng/mL Phencyclidine (PCP), urine         Cutoff 25 ng/mL Cannabinoid, urine  Cutoff 50 ng/mL Barbiturates + metabolites, urine  Cutoff 200 ng/mL Benzodiazepine, urine              Cutoff 200 ng/mL Methadone, urine                   Cutoff 300 ng/mL The urine drug screen provides only a preliminary, unconfirmed analytical test result and should not be used for  non-medical purposes. Clinical consideration and professional judgment should be applied to any positive drug screen result due to possible interfering substances. A more specific alternate chemical method must be used in order to obtain a confirmed analytical result. Gas chromatography / mass spectrometry (GC/MS) is the preferred confirmat ory method. Performed at [facility], [address] [place], [place] [address]     No current facility-administered medications for this encounter.    Current Outpatient Medications  Medication Sig Dispense Refill  . amphetamine-dextroamphetamine (ADDERALL) 20 MG tablet Take 20 mg by mouth 2 (two) times daily.    [name] atorvastatin (LIPITOR) 40 MG tablet Take 40 mg by mouth daily.    [name] buPROPion (WELLBUTRIN XL) 300 MG 24 hr tablet Take 300 mg by mouth daily.    . carvedilol (COREG) 12.5 MG tablet Take 12.5 mg by mouth daily.    . clopidogrel (PLAVIX) 75 MG tablet Take 75 mg by mouth daily.    [name] gabapentin (NEURONTIN) 800 MG tablet Take 800 mg by mouth 3 (three) times daily.    [name] glimepiride (AMARYL) 4 MG tablet Take 4 mg by mouth daily.    [name] lisinopril-hydrochlorothiazide (PRINZIDE,ZESTORETIC) 20-25 MG tablet Take 1 tablet by mouth daily.    . metFORMIN (GLUCOPHAGE) 500 MG tablet Take 1,000 mg by mouth 2 (two) times daily with a meal.    . QUEtiapine (SEROQUEL) 100 MG tablet Take 100 mg by mouth at bedtime.      Musculoskeletal: Strength & Muscle Tone: spastic and decreased Gait & Station: unsteady Patient leans: N/A  Psychiatric Specialty Exam: Physical Exam  Nursing note and vitals reviewed. Constitutional: He appears well-developed and well-nourished.  HENT:  Head: Normocephalic and atraumatic.  Eyes: Pupils are equal, round, and reactive to light. Conjunctivae are normal.  Neck: Normal range of motion.  Cardiovascular: Normal heart sounds.  Respiratory: Effort normal.  GI: Soft.  Musculoskeletal: Normal range of motion.   Neurological: He is alert.  Extremely restless and fidgety  Skin: Skin is warm and dry.  Psychiatric: His mood appears anxious. His affect is labile. His speech is tangential. He is agitated. He is not aggressive. Thought content is paranoid. Cognition and memory are impaired. He expresses impulsivity and inappropriate judgment. He expresses no homicidal and no suicidal ideation. He is noncommunicative. He exhibits abnormal recent memory and abnormal remote memory.    Review of Systems  Constitutional: Negative.   HENT: Negative.   Eyes: Negative.   Respiratory: Negative.   Cardiovascular: Negative.   Gastrointestinal: Negative.   Musculoskeletal: Negative.   Skin: Negative.   Neurological: Negative.   Psychiatric/Behavioral: Positive for depression, memory loss and substance abuse. Negative for hallucinations and suicidal ideas. The patient is nervous/anxious and has insomnia.     Blood pressure 115/89, pulse 80, temperature 98.9 F (37.2 C), temperature source Oral, resp. rate 20, height 6' (1.829 m), weight 94.3 kg, SpO2 96 %.Body mass index is 28.21 kg/m.  General Appearance: Disheveled  Eye Contact:  Minimal  Speech:  Slow  Volume:  Decreased  Mood:  Anxious  Affect:  Constricted  and Inappropriate  Thought Process:  Disorganized  Orientation:  Negative  Thought Content:  Illogical and Tangential  Suicidal Thoughts:  No  Homicidal Thoughts:  No  Memory:  Immediate;   Fair Recent;   Poor Remote;   Poor  Judgement:  Impaired  Insight:  Shallow  Psychomotor Activity:  Restlessness  Concentration:  Concentration: Poor  Recall:  Poor  Fund of Knowledge:  Poor  Language:  Poor  Akathisia:  No  Handed:  Right  AIMS (if indicated):     Assets:  Financial Resources/Insurance Physical Health Resilience  ADL's:  Impaired  Cognition:  Impaired,  Moderate  Sleep:        Treatment Plan Summary: Daily contact with patient to assess and evaluate symptoms and progress in  treatment, Medication management and Plan Patient appears to be somewhat delirious still with suspicious recent overdose.  Possible mood instability.  Very poor self-care.  No place to stay.  Patient meets commitment criteria therefore and I am going to file commitment papers and recommend that we admit him to the hospital as he otherwise really does not have an appropriate disposition.  Patient could be managed on the unit until it is more clear whether this is all substance abuse or there is another underlying mental health condition.  15-minute checks in place.  Labs monitored.  Disposition: Recommend psychiatric Inpatient admission when medically cleared.  Alethia Berthold, MD 09/12/2018 4:03 PM

## 2018-09-12 NOTE — ED Notes (Signed)
Patient given supper tray, asking for another tray, states that he is starving, patient is jittery and talks using arms and legs and hands, very animated, Patient without any complaints, states that he has been sleeping good and needed sleep, nurse will continue to monitor, q 15 minute checks and camera surveillance in progress for safety.

## 2018-09-13 ENCOUNTER — Other Ambulatory Visit: Payer: Self-pay

## 2018-09-13 LAB — HEMOGLOBIN A1C
Hgb A1c MFr Bld: 6.7 % — ABNORMAL HIGH (ref 4.8–5.6)
Mean Plasma Glucose: 145.59 mg/dL

## 2018-09-13 LAB — LIPID PANEL
Cholesterol: 137 mg/dL (ref 0–200)
HDL: 38 mg/dL — ABNORMAL LOW (ref >40)
LDL Cholesterol: 77 mg/dL (ref 0–99)
Total CHOL/HDL Ratio: 3.6 RATIO
Triglycerides: 112 mg/dL (ref <150)
VLDL: 22 mg/dL (ref 0–40)

## 2018-09-13 LAB — TSH: TSH: 0.884 u[IU]/mL (ref 0.350–4.500)

## 2018-09-13 LAB — GLUCOSE, CAPILLARY: Glucose-Capillary: 98 mg/dL (ref 70–99)

## 2018-09-13 MED ORDER — IBUPROFEN 200 MG PO TABS
400.0000 mg | ORAL_TABLET | Freq: Four times a day (QID) | ORAL | Status: DC | PRN
  Administered 2018-09-13 – 2018-09-14 (×3): 400 mg via ORAL
  Filled 2018-09-13 (×3): qty 2

## 2018-09-13 NOTE — Plan of Care (Signed)
Patient newly admitted, hasn't had time to progress.   Problem: Education: Goal: Knowledge of [facility] Education information/materials will improve Outcome: Not Progressing   Problem: Safety: Goal: Periods of time without injury will increase Outcome: Not Progressing   Problem: Education: Goal: Ability to make informed decisions regarding treatment will improve Outcome: Not Progressing   Problem: Coping: Goal: Coping ability will improve Outcome: Not Progressing

## 2018-09-13 NOTE — Progress Notes (Signed)
D: Pt denies SI/HI/AVH, can contract for safety. Pt is cooperative with assessments, but reports agitation and irritability with the weekend attending, because, "she won't prescribe me my medications". Patient Interactions are mostly appropriate, despite this irritability expressed with not getting his medications. Pt. Denies pain, but later endorses chronic knee pain, given PRN medications for this. Pt. Reports a mostly dysphoric mood.   A: Q x 15 minute observation checks were completed for safety. Patient was provided with education, but needs reinforcement. Patient was given/offered medications per orders. Patient  was encourage to attend groups, participate in unit activities and continue with plan of care. Pt. Chart and plans of care reviewed. Pt. Given support and encouragement.   R: Patient is complaint with medication and unit procedures. Pt. Eating good. Pt. Self inventory inconsistent with assessments with this Probation officer.             Precautionary checks every 15 minutes for safety maintained, room free of safety hazards, patient sustains no injury or falls during this shift. Will endorse care to next shift.

## 2018-09-13 NOTE — Progress Notes (Signed)
[facility] MD Progress Note  09/13/2018 8:48 AM Tyler Martinez  MRN:  [id number] Subjective:  Pt seen and chart reviewed.  Tyler Martinez is very irritable, and in denial of his Amphetamine abuse.  Tyler Martinez is angry that we did not give his medicine, referring to Adderall.  Tyler Martinez still denies abusing adderall.  Tyler Martinez doesn't think Tyler Martinez has any problem with addiction.   Per Tyler Martinez: pt has been prescribed with Adderall by Tyler Martinez at [place] [address]; [phone] for years.  Last filled on 09/03/2018 for Adderall 20mg  tabs, #60.  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   Principal Problem: <principal problem not specified> Diagnosis: Active Problems:   Amphetamine and psychostimulant-induced psychosis with delusions ([place])  Total Time spent with patient: 20 minutes    Past Psychiatric History:  Details largely unknown at this time, due to pt's uncooperation. Reportedly Tyler Martinez was prescribed with seroquel for sleep.   Substance abuse history: Evidently longstanding history of abuse of substances especially narcotics and alcohol.  Patient claims Tyler Martinez has the narcotics and alcohol completely under control now.  Tyler Martinez is still prescribed Adderall by an outpatient provider.  Looks like it suspicious for him abusing amphetamines now.  Past Medical History:  Past Medical History:  Diagnosis Date  . Cardiac arrest ([place]) 2008  . MI (myocardial infarction) [facility])     Past Surgical History:  Procedure Laterality Date  . CAROTID STENT  2008  . EXPLORATION POST OPERATIVE OPEN HEART  2008   Family History: History reviewed. No pertinent family history. Family Psychiatric  History: unknown Social History:  Social History   Substance and Sexual Activity  Alcohol Use Never   . Frequency: Never     Social History   Substance and Sexual Activity  Drug Use Never    Social History   Socioeconomic History  . Marital status: Single    Spouse name: Not on file  . Number of children: Not on file  . Years of education: Not on file  . Highest education level: Not on file  Occupational History  . Not on file  Social Needs  . Financial resource strain: Not on file  . Food insecurity:    Worry: Not on file    Inability: Not on file  . Transportation needs:    Medical: Not on file    Non-medical: Not on file  Tobacco Use  . Smoking status: Current Every Day Smoker    Packs/day: 3.00    Types: Cigarettes  . Smokeless tobacco: Never Used  Substance and Sexual Activity  . Alcohol use: Never    Frequency: Never  . Drug use: Never  . Sexual activity: Not on file  Lifestyle  . Physical activity:    Days per week: Not on file    Minutes per session: Not on file  . Stress: Not on file  Relationships  . Social connections:    Talks on phone: Not on file    Gets together: Not on file    Attends religious service: Not on file    Active member of club or organization: Not on file    Attends meetings of clubs or organizations: Not on file    Relationship status: Not on  file  Other Topics Concern  . Not on file  Social History Narrative  . Not on file   Additional Social History:  Tyler Martinez is homeless, and his brother is the only family member who is still willing to help him.   Sleep: Fair  Appetite:  Fair  Current Medications: Current Facility-Administered Medications  Medication Dose Route Frequency Provider Last Rate Last Dose  . acetaminophen (TYLENOL) tablet 650 mg  650 mg Oral Q6H PRN Clapacs, [name], MD   650 mg at 09/12/18 2115  . alum & mag hydroxide-simeth (MAALOX/MYLANTA) 200-200-20 MG/5ML suspension 30 mL  30 mL Oral Q4H PRN Clapacs, [name], MD      . atorvastatin (LIPITOR) tablet 40 mg  40 mg Oral Daily Clapacs, [name], MD   40 mg at  09/13/18 0734  . carvedilol (COREG) tablet 12.5 mg  12.5 mg Oral Daily Clapacs, [name], MD   12.5 mg at 09/13/18 0734  . clopidogrel (PLAVIX) tablet 75 mg  75 mg Oral Daily Clapacs, [name], MD   75 mg at 09/13/18 0734  . glimepiride (AMARYL) tablet 4 mg  4 mg Oral Daily Clapacs, [name], MD   4 mg at 09/13/18 0738  . lisinopril (PRINIVIL,ZESTRIL) tablet 20 mg  20 mg Oral Daily Clapacs, [name], MD   20 mg at 09/13/18 6629   And  . hydrochlorothiazide (HYDRODIURIL) tablet 25 mg  25 mg Oral Daily Clapacs, [name], MD   25 mg at 09/13/18 0734  . magnesium hydroxide (MILK OF MAGNESIA) suspension 30 mL  30 mL Oral Daily PRN Clapacs, [name], MD      . metFORMIN (GLUCOPHAGE) tablet 1,000 mg  1,000 mg Oral BID WC Clapacs, [name], MD   1,000 mg at 09/13/18 0734  . nicotine (NICODERM CQ - dosed in mg/24 hours) patch 21 mg  21 mg Transdermal Daily Clapacs, [name], MD   21 mg at 09/13/18 0734  . QUEtiapine (SEROQUEL) tablet 100 mg  100 mg Oral QHS Clapacs, [name], MD   100 mg at 09/12/18 2115  . QUEtiapine (SEROQUEL) tablet 50 mg  50 mg Oral TID PRN Clapacs, [name], MD   50 mg at 09/13/18 0100    Lab Results:  Results for orders placed or performed during the hospital encounter of 09/12/18 (from the past 48 hour(s))  Lipid panel     Status: Abnormal   Collection Time: 09/13/18  6:48 AM  Result Value Ref Range   Cholesterol 137 0 - 200 mg/dL   Triglycerides 112 <150 mg/dL   HDL 38 (L) >40 mg/dL   Total CHOL/HDL Ratio 3.6 RATIO   VLDL 22 0 - 40 mg/dL   LDL Cholesterol 77 0 - 99 mg/dL    Comment:        Total Cholesterol/HDL:CHD Risk Coronary Heart Disease Risk Table                     Men   Women  1/2 Average Risk   3.4   3.3  Average Risk       5.0   4.4  2 X Average Risk   9.6   7.1  3 X Average Risk  23.4   11.0        Use the calculated Patient Ratio above and the CHD Risk Table to determine the patient's CHD Risk.        ATP III CLASSIFICATION (LDL):  <100  mg/dL   Optimal  100-129   mg/dL   Near or Above                    Optimal  130-159  mg/dL   Borderline  160-189  mg/dL   High  >190     mg/dL   Very High Performed at [facility], [address] [place], [place] [address]   TSH     Status: None   Collection Time: 09/13/18  6:48 AM  Result Value Ref Range   TSH 0.884 0.350 - 4.500 uIU/mL    Comment: Performed by a 3rd Generation assay with a functional sensitivity of <=0.01 uIU/mL. Performed at [facility], [address] [place], [place] [address]     Blood Alcohol level:  Lab Results  Component Value Date   [facility] <10 32/44/0102    Metabolic Disorder Labs: No results found for: HGBA1C, MPG No results found for: PROLACTIN Lab Results  Component Value Date   CHOL 137 09/13/2018   TRIG 112 09/13/2018   HDL 38 (L) 09/13/2018   CHOLHDL 3.6 09/13/2018   VLDL 22 09/13/2018   LDLCALC 77 09/13/2018    Physical Findings: AIMS:  , ,  ,  ,    CIWA:    COWS:     Musculoskeletal: Strength & Muscle Tone: within normal limits Gait & Station: normal Patient leans: N/A  Psychiatric Specialty Exam: Physical Exam  ROS  Blood pressure 117/81, pulse 84, temperature 98.2 F (36.8 C), temperature source Oral, resp. rate 16, height 6' (1.829 m), weight 94.3 kg, SpO2 99 %.Body mass index is 28.2 kg/m.  General Appearance: Casual  Eye Contact:  Poor  Speech:  Clear and Coherent  Volume:  Normal  Mood:  Angry and Irritable  Affect:  Constricted and Inappropriate  Thought Process:  Coherent and Goal Directed  Orientation:  Full (Time, Place, and Person)  Thought Content:  Logical  Suicidal Thoughts:  No  Homicidal Thoughts:  No  Memory:  Immediate;   Good Recent;   Poor Remote;   Poor  Judgement:  Impaired  Insight:  Lacking  Psychomotor Activity:  Normal  Concentration:  Concentration: Fair and Attention Span: Fair  Recall:  [employer] of Knowledge:  Fair  Language:  Fair  Akathisia:  No      Assets:  Physical Health   ADL's:  Intact  Cognition:  WNL  Sleep:  Number of Hours: 4.5     Treatment Plan Summary: Daily contact with patient to assess and evaluate symptoms and progress in treatment and Medication management   Amphetamine use disorder, Amphetamine-induced psychosis -- continue to be drug seek, demanded to have his adderall this morning.  Has no insight regarding his addiction.  -- contacted with Adderall prescriber Tyler Martinez's office. [phone], and message was left for the her answering service.  -- avoid all controlled meds.   Hx of opioid use disorder, in remission.  -- avoid narcotic pain meds if possible.   Mood disorder, r/o SIMD (substance induced mood disorder).  -- continue Seroquel 100mg  qhs -- continue to provide seroquel 50mg  TIDPRN.    HTN -- continue Lisinopril 20mg  adialy.  -- continue HCTZ 25mg  daily.  -- continue Coreg 12.5mg  daily.   DM-II -- continue Metformin 1000mg  BID  Hyperlipidemia -- Lipitor 40mg  daily.  -- continue Plavix 75mg  daily.   Disposition:  Defer to primary team.  ADATC can be beneficial.     Robbi Scurlock  Sie Formisano, MD 09/13/2018, 8:48 AM

## 2018-09-13 NOTE — Progress Notes (Signed)
D - Report received from [facility] Emergency Department and patient received at [place]. Skin check performed with Cristie Hem, RN. Patient has a scar on his chest from a previous surgery. Patient has bilateral ecchymosis on his lower legs. Patient wasn't sure how it happened but said maybe when he fell at home the day he was brought in. Patient put on fall risk. No contraband found during assessment of patient. Patient was pleasant during assessment and medication administration per MD orders. Patient is fidgety and anxious. Patient is preoccupied with getting his adderall to, "get my head right".   A - Patient was compliant with medication administration per MD orders. Patient given education. Patient given support and encouragement to attend group and be active in his treatment plan. Patient informed to let staff know if there are any issues or problems on the unit.   R - Patient verbally contracted for safety with this Probation officer. Patient being monitored Q 15 minutes for safety per unit protocol. Patient remains safe on the unit.

## 2018-09-13 NOTE — Tx Team (Signed)
Initial Treatment Plan 09/13/2018 1:55 AM [name] CQP:[id number]    PATIENT STRESSORS: Health problems Medication change or noncompliance Substance abuse   PATIENT STRENGTHS: Ability for insight Motivation for treatment/growth Supportive family/friends   PATIENT IDENTIFIED PROBLEMS: Suicidal Ideation  Depression  Anxiety                 DISCHARGE CRITERIA:  Motivation to continue treatment in a less acute level of care Verbal commitment to aftercare and medication compliance  PRELIMINARY DISCHARGE PLAN: Outpatient therapy Return to previous living arrangement  PATIENT/FAMILY INVOLVEMENT: This treatment plan has been presented to and reviewed with the patient, Tyler Martinez.  The patient has been given the opportunity to ask questions and make suggestions.  Mallie Darting, RN 09/13/2018, 1:55 AM

## 2018-09-13 NOTE — Plan of Care (Signed)
Pt. Denies si/hi/avh, can contract for safety.     Problem: Safety: Goal: Periods of time without injury will increase Outcome: Progressing

## 2018-09-13 NOTE — BHH Group Notes (Signed)
LCSW Group Therapy Note  09/13/2018 1:15pm  Type of Therapy and Topic:  Group Therapy:  Cognitive Distortions  Participation Level:  Active   Description of Group:    Patients in this group will be introduced to the topic of cognitive distortions.  Patients will identify and describe cognitive distortions, describe the feelings these distortions create for them.  Patients will identify one or more situations in their personal life where they have cognitively distorted thinking and will verbalize challenging this cognitive distortion through positive thinking skills.  Patients will practice the skill of using positive affirmations to challenge cognitive distortions using affirmation cards.    Therapeutic Goals:  1. Patient will identify two or more cognitive distortions they have used 2. Patient will identify one or more emotions that stem from use of a cognitive distortion 3. Patient will demonstrate use of a positive affirmation to counter a cognitive distortion through discussion and/or role play. 4. Patient will describe one way cognitive distortions can be detrimental to wellness   Summary of Patient Progress: The patient reported that he's "hurting." Patients were introduced to the topic of cognitive distortions. The patient was able to identify and describe cognitive distortions, described the feelings these distortions create for him. Patient identified a situation in his personal life where he  has cognitively distorted thinking and was able to verbalize and challenged this cognitive distortion through positive thinking skills. Patient was able to provide support and validation to other group members.     Therapeutic Modalities:   Cognitive Behavioral Therapy Motivational Interviewing   Leoma Folds  CUEBAS-COLON, LCSW 09/13/2018 12:46 PM

## 2018-09-14 DIAGNOSIS — F15950 Other stimulant use, unspecified with stimulant-induced psychotic disorder with delusions: Secondary | ICD-10-CM

## 2018-09-14 MED ORDER — TRAZODONE HCL 50 MG PO TABS
50.0000 mg | ORAL_TABLET | Freq: Every day | ORAL | Status: DC
  Administered 2018-09-14: 50 mg via ORAL
  Filled 2018-09-14: qty 1

## 2018-09-14 NOTE — Progress Notes (Signed)
D - Patient was in the day room upon arrival to the unit. Patient was irritated during assessment and medication administration. Patient denies SI/HI/AVH. Patient states, "I am in pain, its everywhere. What can you give me for it?" See MAR. Patient stated, "The fucking doctor wouldn't give my adderall so no I can't get my head right and that's just not right!" Patient given education. Patient continued to come up to the nurses station until 0100 asking for any medication that can help him feel better.   A - Patient was compliant with medication administration per MD orders. Patient given education. Patient given support and encouragement to attend group and be active in his treatment plan. Patient informed to let staff know if there are any issues or problems on the unit.   R - Patient verbally contracted for safety with this Probation officer. Patient being monitored Q 15 minutes for safety per unit protocol. Patient remains safe on the unit.

## 2018-09-14 NOTE — Plan of Care (Signed)
Patient is irritable because he feels like he isn't getting the medications he needs from the doctor. Patient is med seeking and keeps asking for "anything you can give me".   Problem: Education: Goal: Ability to make informed decisions regarding treatment will improve Outcome: Not Progressing   Problem: Coping: Goal: Coping ability will improve Outcome: Not Progressing

## 2018-09-14 NOTE — Plan of Care (Signed)
Stayed in the milieu. Irritable but redirectable. Expressing dissatisfaction with his medication regimen. Encouraged to discuss it with attending provider.

## 2018-09-14 NOTE — BHH Group Notes (Signed)
[place] Group Notes:  (Nursing/MHT/Case Management/Adjunct)  Date:  09/14/2018  Time:  9:11 PM  Type of Therapy:  Group Therapy  Participation Level:  Active  Participation Quality:  Appropriate and Talk about not sleeping  Affect:  Appropriate  Cognitive:  Alert  Insight:  Good  Engagement in Group:  Engaged and Wanting to talk to the Doctor.  Modes of Intervention:  Support  Summary of Progress/Problems:  Tyler Martinez 09/14/2018, 9:11 PM

## 2018-09-14 NOTE — Progress Notes (Signed)
D- Patient alert and oriented. Patient presents in a pleasant mood on assessment stating that he wants his BP medication during the day because he couldn't sleep. Patient rates his pain an "8/10" stating that he has rheumatoid arthritis, in which he did request pain medication from this Probation officer. Patient rates both his depression and anxiety an "8/10" stating that because he hasn't been on his Adderall in four to five days is why he's feeling this way. Patient denies SI, HI, AVH, at this time "not right now". Patient's goal for today is to "sleep"   A- Scheduled medications administered to patient, per MD orders. Support and encouragement provided.  Routine safety checks conducted every 15 minutes.  Patient informed to notify staff with problems or concerns.  R- No adverse drug reactions noted. Patient contracts for safety at this time. Patient compliant with medications and treatment plan. Patient receptive, calm, and cooperative. Patient interacts well with others on the unit.  Patient remains safe at this time.

## 2018-09-14 NOTE — BHH Group Notes (Signed)
LCSW Group Therapy Note 09/14/2018 1:15pm  Type of Therapy and Topic: Group Therapy: Feelings Around Returning Home & Establishing a Supportive Framework and Supporting Oneself When Supports Not Available  Participation Level: Did Not Attend  Description of Group:  Patients first processed thoughts and feelings about upcoming discharge. These included fears of upcoming changes, lack of change, new living environments, judgements and expectations from others and overall stigma of mental health issues. The group then discussed the definition of a supportive framework, what that looks and feels like, and how do to discern it from an unhealthy non-supportive network. The group identified different types of supports as well as what to do when your family/friends are less than helpful or unavailable  Therapeutic Goals  1. Patient will identify one healthy supportive network that they can use at discharge. 2. Patient will identify one factor of a supportive framework and how to tell it from an unhealthy network. 3. Patient able to identify one coping skill to use when they do not have positive supports from others. 4. Patient will demonstrate ability to communicate their needs through discussion and/or role plays.  Summary of Patient Progress:  Pt was invited to attend group but chose not to attend. CSW will continue to encourage pt to attend group throughout their admission.  Therapeutic Modalities Cognitive Behavioral Therapy Motivational Interviewing   Johnny Gorter  CUEBAS-COLON, LCSW 09/14/2018 9:17 AM

## 2018-09-14 NOTE — Plan of Care (Signed)
Patient verbalizes understanding of the general information that's been provided to him and all questions/concerns have been addressed and answered at this time. Patient has been free from injury thus far and has the ability to make informed decisions at this time. Patient has the ability to cope and has been observed interacting well with staff and other members on the unit without any issues at this time. Patient remains safe on the unit at this time.  Problem: Education: Goal: Knowledge of [place] General Education information/materials will improve Outcome: Progressing   Problem: Safety: Goal: Periods of time without injury will increase Outcome: Progressing   Problem: Education: Goal: Ability to make informed decisions regarding treatment will improve Outcome: Progressing   Problem: Coping: Goal: Coping ability will improve Outcome: Progressing

## 2018-09-14 NOTE — BHH Counselor (Signed)
Adult Comprehensive Assessment  Patient ID: Tyler Martinez, male   DOB: 27-Jun-1971, 48 y.o.   MRN: [id number]  Information Source: Information source: Patient  Current Stressors:  Patient states their primary concerns and needs for treatment are:: "I took Seroquel and it knocked my out" Patient states their goals for this hospitilization and ongoing recovery are:: "be aware of my meds" Educational / Learning stressors: none reported Employment / Job issues: none reported Family Relationships: "I never get to see themPublishing copy / Lack of resources (include bankruptcy): disability Housing / Lack of housing: stabe Social relationships: "loner most ofhe time Substance abuse: none reported Bereavement / Loss: none reported  Living/Environment/Situation:  Living Arrangements: Alone, Other relatives Who else lives in the home?: brother How long has patient lived in current situation?: 2 months What is atmosphere in current home: Temporary  Family History:  Marital status: Single Are you sexually active?: No What is your sexual orientation?: hetersosexual Does patient have children?: Yes How many children?: 2 How is patient's relationship with their children?: good  Childhood History:  By whom was/is the patient raised?: Adoptive parents Description of patient's relationship with caregiver when they were a child: great Patient's description of current relationship with people who raised him/her: great How were you disciplined when you got in trouble as a child/adolescent?: spanking Does patient have siblings?: Yes Number of Siblings: 3 Description of patient's current relationship with siblings: good Did patient suffer any verbal/emotional/physical/sexual abuse as a child?: No Did patient suffer from severe childhood neglect?: No Has patient ever been sexually abused/assaulted/raped as an adolescent or adult?: No Was the patient ever a victim of a crime or a disaster?: No Witnessed  domestic violence?: No Has patient been effected by domestic violence as an adult?: No  Education:  Highest grade of school patient has completed: 10th Currently a student?: No Learning disability?: No  Employment/Work Situation:   Employment situation: On disability Why is patient on disability: medical How long has patient been on disability: 2006 Patient's job has been impacted by current illness: No What is the longest time patient has a held a job?: 20 years Where was the patient employed at that time?: [employer] Did You Receive Any Psychiatric Treatment/Services While in the [employer]?: No Are There Guns or Other Weapons in [place]?: No  Financial Resources:   Financial resources: Teacher, early years/pre Does patient have a Programmer, applications or guardian?: No  Alcohol/Substance Abuse:   What has been your use of drugs/alcohol within the last 12 months?: denies  If attempted suicide, did drugs/alcohol play a role in this?: No Alcohol/Substance Abuse Treatment Hx: Past Tx, Inpatient, Past Tx, Outpatient, Past detox, Attends AA/NA Has alcohol/substance abuse ever caused legal problems?: No  Social Support System:   Pensions consultant Support System: Fair Describe Community Support System: brother Type of faith/religion: Darrick Meigs How does patient's faith help to cope with current illness?: n/a  Leisure/Recreation:   Leisure and Hobbies: "trying to stay busy"  Strengths/Needs:   What is the patient's perception of their strengths?: "I think I am good with people" Patient states they can use these personal strengths during their treatment to contribute to their recovery: "I don't know' Patient states these barriers may affect/interfere with their treatment: no Patient states these barriers may affect their return to the community: no  Discharge Plan:   Currently receiving community mental health services: No Patient states concerns and preferences for aftercare  planning are: TBD with CSW Patient states they will know when  they are safe and ready for discharge when: "When I get my meds" Does patient have access to transportation?: Yes Does patient have financial barriers related to discharge medications?: No Will patient be returning to same living situation after discharge?: Yes  Summary/Recommendations: Patient is a 48 year old male admitted involuntarily and diagnosed with Amphetamine and psychostimulant-induced psychosis with delusions ([place]).  Patient was found by his brother at home unconscious and unarousable.  Patient has gradually woken up here in the emergency room.  He says that he had gone out with his "fianc" and been in in a motel for 4 days and had not slept for the entire time.  Patient refuses to offer any explanation for why he could not sleep.  He says afterwards he found himself wandering around outside and needed to sleep so he took 4 times his usual Seroquel dose.  Somewhere along in there the police picked him up for being intoxicated in public and took him back to his brother's house.  Brother came home and found the patient unarousable. Patient will benefit from crisis stabilization, medication evaluation, group therapy and psychoeducation. In addition to case management for discharge planning. At discharge it is recommended that patient adhere to the established discharge plan and continue treatment.      Tyler Atteberry  Martinez. 09/14/2018

## 2018-09-14 NOTE — Progress Notes (Signed)
[facility] MD Progress Note  09/14/2018 1:17 PM Tyler Martinez  MRN:  [id number] Subjective:  Pt seen and chart reviewed.  Tyler Martinez continues to be very irritable and disrespectful.  Tyler Martinez referred the Probation officer as "the [place] bitch" when Tyler Martinez talked to our staff.  Tyler Martinez complained that his meds were given during the day, but Tyler Martinez takes at night at home.  All the meds were given during the day were for his BP, and Tyler Martinez is reassured that his BP meds can be taken during the day.   Tyler Martinez admitted craving for "my medicine, my adderall". While Tyler Martinez understands that Tyler Martinez is not going to get it in the hospital, Tyler Martinez continues to be in denial of his Amphetamine abuse.  Tyler Martinez continues to say that Tyler Martinez has no problem with addiction, and no problem with adderall.  Tyler Martinez has NO interest in stop taking Adderall or any kind of addiction treatment, once Tyler Martinez is released.   Per Tyler Martinez CSRS: pt has been prescribed with Adderall by Tyler Martinez at [place] [address]; [phone] for years.  Last filled on 09/03/2018 for Adderall 20mg  tabs, #60.  Pt gave the writer permission to call Ms. Tyler Martinez, stated "sure, you can call her and I will just find another doctor to get my adderall!"   A VM is left at Ms. 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 Reportedly Tyler Martinez was prescribed with seroquel for sleep.   Substance abuse history: Evidently longstanding history of abuse of substances especially narcotics and alcohol.  Patient claims Tyler Martinez has the narcotics and alcohol completely under control now.  Tyler Martinez is still prescribed Adderall by an  outpatient provider.  Looks like it suspicious for him abusing amphetamines now.  Past Medical History:  Past Medical History:  Diagnosis Date  . Cardiac arrest (Tyler Martinez) 2008  . MI (myocardial infarction) [facility])     Past Surgical History:  Procedure Laterality Date  . CAROTID STENT  2008  . EXPLORATION POST OPERATIVE OPEN HEART  2008   Family History: History reviewed. No pertinent family history. Family Psychiatric  History: unknown Social History:  Social History   Substance and Sexual Activity  Alcohol Use Never  . Frequency: Never     Social History   Substance and Sexual Activity  Drug Use Never    Social History   Socioeconomic History  . Marital status: Single    Spouse name: Not on file  . Number of children: Not on file  . Years of education: Not on file  . Highest education level: Not on file  Occupational History  . Not on file  Social Needs  . Financial resource strain: Not on file  . Food insecurity:    Worry: Not on file    Inability: Not on file  . Transportation needs:    Medical: Not on file    Non-medical: Not on file  Tobacco Use  . Smoking status: Current Every Day Smoker    Packs/day: 3.00    Types: Cigarettes  . Smokeless tobacco: Never Used  Substance and Sexual Activity  . Alcohol use: Never    Frequency: Never  . Drug  use: Never  . Sexual activity: Not on file  Lifestyle  . Physical activity:    Days per week: Not on file    Minutes per session: Not on file  . Stress: Not on file  Relationships  . Social connections:    Talks on phone: Not on file    Gets together: Not on file    Attends religious service: Not on file    Active member of club or organization: Not on file    Attends meetings of clubs or organizations: Not on file    Relationship status: Not on file  Other Topics Concern  . Not on file  Social History Narrative  . Not on file   Additional Social History:  Tyler Martinez is homeless, and his brother is the only family  member who is still willing to help him.   Sleep: Fair  Appetite:  Fair  Current Medications: Current Facility-Administered Medications  Medication Dose Route Frequency Provider Last Rate Last Dose  . acetaminophen (TYLENOL) tablet 650 mg  650 mg Oral Q6H PRN Clapacs, [name], MD   650 mg at 09/13/18 2116  . alum & mag hydroxide-simeth (MAALOX/MYLANTA) 200-200-20 MG/5ML suspension 30 mL  30 mL Oral Q4H PRN Clapacs, [name], MD      . atorvastatin (LIPITOR) tablet 40 mg  40 mg Oral Daily Clapacs, [name], MD   40 mg at 09/14/18 0807  . carvedilol (COREG) tablet 12.5 mg  12.5 mg Oral Daily Clapacs, [name], MD   12.5 mg at 09/14/18 0807  . clopidogrel (PLAVIX) tablet 75 mg  75 mg Oral Daily Clapacs, [name], MD   75 mg at 09/14/18 0807  . glimepiride (AMARYL) tablet 4 mg  4 mg Oral Daily Clapacs, [name], MD   4 mg at 09/14/18 0807  . lisinopril (PRINIVIL,ZESTRIL) tablet 20 mg  20 mg Oral Daily Clapacs, [name], MD   20 mg at 09/14/18 3810   And  . hydrochlorothiazide (HYDRODIURIL) tablet 25 mg  25 mg Oral Daily Clapacs, [name], MD   25 mg at 09/14/18 0807  . ibuprofen (ADVIL,MOTRIN) tablet 400 mg  400 mg Oral Q6H PRN Novalee Horsfall, MD   400 mg at 09/14/18 0806  . magnesium hydroxide (MILK OF MAGNESIA) suspension 30 mL  30 mL Oral Daily PRN Clapacs, [name], MD      . metFORMIN (GLUCOPHAGE) tablet 1,000 mg  1,000 mg Oral BID WC Clapacs, [name], MD   1,000 mg at 09/14/18 0807  . nicotine (NICODERM CQ - dosed in mg/24 hours) patch 21 mg  21 mg Transdermal Daily Clapacs, [name], MD   21 mg at 09/14/18 1751  . QUEtiapine (SEROQUEL) tablet 100 mg  100 mg Oral QHS Clapacs, [name], MD   100 mg at 09/13/18 2116  . QUEtiapine (SEROQUEL) tablet 50 mg  50 mg Oral TID PRN Clapacs, [name], MD   50 mg at 09/14/18 0807    Lab Results:  Results for orders placed or performed during the hospital encounter of 09/12/18 (from the past 48 hour(s))  Hemoglobin A1c     Status: Abnormal   Collection Time: 09/13/18  6:48 AM   Result Value Ref Range   Hgb A1c MFr Bld 6.7 (H) 4.8 - 5.6 %    Comment: (NOTE) Pre diabetes:          5.7%-6.4% Diabetes:              >6.4% Glycemic control  for   <7.0% adults with diabetes    Mean Plasma Glucose 145.59 mg/dL    Comment: Performed at [facility] [address] [place], [place] [address]  Lipid panel     Status: Abnormal   Collection Time: 09/13/18  6:48 AM  Result Value Ref Range   Cholesterol 137 0 - 200 mg/dL   Triglycerides 112 <150 mg/dL   HDL 38 (L) >40 mg/dL   Total CHOL/HDL Ratio 3.6 RATIO   VLDL 22 0 - 40 mg/dL   LDL Cholesterol 77 0 - 99 mg/dL    Comment:        Total Cholesterol/HDL:CHD Risk Coronary Heart Disease Risk Table                     Men   Women  1/2 Average Risk   3.4   3.3  Average Risk       5.0   4.4  2 X Average Risk   9.6   7.1  3 X Average Risk  23.4   11.0        Use the calculated Patient Ratio above and the CHD Risk Table to determine the patient's CHD Risk.        ATP III CLASSIFICATION (LDL):  <100     mg/dL   Optimal  100-129  mg/dL   Near or Above                    Optimal  130-159  mg/dL   Borderline  160-189  mg/dL   High  >190     mg/dL   Very High Performed at [facility], [address] [place], [place] [address]   TSH     Status: None   Collection Time: 09/13/18  6:48 AM  Result Value Ref Range   TSH 0.884 0.350 - 4.500 uIU/mL    Comment: Performed by a 3rd Generation assay with a functional sensitivity of <=0.01 uIU/mL. Performed at [facility], [address] [place], [place] [address]   Glucose, capillary     Status: None   Collection Time: 09/13/18  6:39 PM  Result Value Ref Range   Glucose-Capillary 98 70 - 99 mg/dL    Blood Alcohol level:  Lab Results  Component Value Date   ETH <10 56/38/7564    Metabolic Disorder Labs: Lab Results  Component Value Date   HGBA1C 6.7 (H) 09/13/2018   MPG 145.59 09/13/2018   No results found for: PROLACTIN Lab Results   Component Value Date   CHOL 137 09/13/2018   TRIG 112 09/13/2018   HDL 38 (L) 09/13/2018   CHOLHDL 3.6 09/13/2018   VLDL 22 09/13/2018   LDLCALC 77 09/13/2018    Physical Findings: AIMS:  , ,  ,  ,    CIWA:    COWS:     Musculoskeletal: Strength & Muscle Tone: within normal limits Gait & Station: normal Patient leans: N/A  Psychiatric Specialty Exam: Physical Exam   ROS   Blood pressure (!) 131/91, pulse 81, temperature 98.6 F (37 C), temperature source Oral, resp. rate 16, height 6' (1.829 m), weight 94.3 kg, SpO2 98 %.Body mass index is 28.2 kg/m.  General Appearance: Casual  Eye Contact:  Poor  Speech:  Clear and Coherent  Volume:  Normal  Mood:  Angry and Irritable  Affect:  Constricted and Inappropriate  Thought Process:  Coherent and Goal Directed  Orientation:  Full (Time,  Place, and Person)  Thought Content:  Logical  Suicidal Thoughts:  No  Homicidal Thoughts:  No  Memory:  Immediate;   Good Recent;   Poor Remote;   Poor  Judgement:  Impaired  Insight:  Lacking  Psychomotor Activity:  Normal  Concentration:  Concentration: Fair and Attention Span: Fair  Recall:  [employer] of Knowledge:  Fair  Language:  Fair  Akathisia:  No      Assets:  Physical Health  ADL's:  Intact  Cognition:  WNL  Sleep:  Number of Hours: 5     Treatment Plan Summary: Daily contact with patient to assess and evaluate symptoms and progress in treatment and Medication management   Amphetamine use disorder, Amphetamine-induced psychosis -- continue to be drug seeking for "my Adderall".  Has no insight regarding his addiction.  -- contacted with Adderall prescriber Ms. Tyler Martinez's office. [phone], and message was left for the her answering service.  -- avoid all controlled meds.  -- add trazodone qhs.   Hx of opioid use disorder, in remission.  -- avoid narcotic pain meds if possible.   Mood disorder, r/o SIMD (substance induced mood disorder).  -- continue  Seroquel 100mg  qhs -- continue to provide seroquel 50mg  TIDPRN.  Sachit Gilman uses 1-2x a day.    HTN -- continue Lisinopril 20mg  adialy.  -- continue HCTZ 25mg  daily.  -- continue Coreg 12.5mg  daily.   DM-II -- continue Metformin 1000mg  BID  Hyperlipidemia -- Lipitor 40mg  daily.  -- continue Plavix 75mg  daily.   Disposition:  Defer to primary team.  ADATC can be beneficial only if pt is motivated to get help, which Sheree Lalla is not at this time.     Danese Dorsainvil, MD 09/14/2018, 1:17 PM

## 2018-09-15 MED ORDER — QUETIAPINE FUMARATE 100 MG PO TABS: 100.0000 mg | ORAL_TABLET | Freq: Every day | ORAL | 1 refills | Status: DC

## 2018-09-15 MED ORDER — HYDROCHLOROTHIAZIDE 25 MG PO TABS: 25.0000 mg | ORAL_TABLET | Freq: Every day | ORAL | 1 refills | Status: DC

## 2018-09-15 MED ORDER — CLOPIDOGREL BISULFATE 75 MG PO TABS: 75.0000 mg | ORAL_TABLET | Freq: Every day | ORAL | 1 refills | Status: DC

## 2018-09-15 MED ORDER — LISINOPRIL 20 MG PO TABS: 20.0000 mg | ORAL_TABLET | Freq: Every day | ORAL | 1 refills | Status: DC

## 2018-09-15 MED ORDER — METFORMIN HCL 1000 MG PO TABS: 1000.0000 mg | ORAL_TABLET | Freq: Two times a day (BID) | ORAL | 1 refills | Status: DC

## 2018-09-15 MED ORDER — ATORVASTATIN CALCIUM 40 MG PO TABS: 40.0000 mg | ORAL_TABLET | Freq: Every day | ORAL | 1 refills | Status: DC

## 2018-09-15 MED ORDER — GLIMEPIRIDE 4 MG PO TABS: 4.0000 mg | ORAL_TABLET | Freq: Every day | ORAL | 1 refills | Status: DC

## 2018-09-15 MED ORDER — CARVEDILOL 12.5 MG PO TABS: 12.5000 mg | ORAL_TABLET | Freq: Every day | ORAL | 1 refills | Status: DC

## 2018-09-15 NOTE — BHH Suicide Risk Assessment (Signed)
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   HENT: Negative.   Eyes: Negative.   Respiratory: Negative.   Cardiovascular: Negative.   Gastrointestinal: Negative.   Musculoskeletal: Negative.   Skin: Negative.   Neurological: Negative.   Psychiatric/Behavioral: Negative.     Blood pressure (!) 120/92, pulse 81, temperature 98.3 F (36.8 C), temperature source Oral, resp. rate 17, height 6' (1.829 m), weight 94.3 kg, SpO2 98 %.Body mass index is 28.2 kg/m.  General Appearance: Casual  Eye Contact::  Good  Speech:  Clear and [id number]  Volume:  Normal  Mood:  Euthymic  Affect:  Congruent  Thought Process:  Goal Directed  Orientation:  Full (Time, Place, and Person)  Thought Content:  Logical  Suicidal Thoughts:  No  Homicidal Thoughts:  No  Memory:  Immediate;   Fair Recent;   Fair Remote;   Fair  Judgement:  Impaired  Insight:  Shallow  Psychomotor Activity:  Decreased  Concentration:  Fair  Recall:  [employer] of Knowledge:Fair  Language: Fair  Akathisia:  No  Handed:  Right  AIMS (if indicated):     Assets:  Housing Physical Health  Sleep:  Number of Hours: 4  Cognition: WNL  ADL's:  Intact   Mental Status Per Nursing Assessment::   On Admission:  NA  Demographic Factors:  Male, Caucasian and Low socioeconomic status  Loss Factors: Decline in physical health  Historical Factors: Impulsivity  Risk Reduction Factors:   Religious beliefs about death and Positive social support  Continued Clinical Symptoms:  Alcohol/Substance  Abuse/Dependencies  Cognitive Features That Contribute To Risk:  Loss of executive function    Suicide Risk:  Minimal: No identifiable suicidal ideation.  Patients presenting with no risk factors but with morbid ruminations; may be classified as minimal risk based on the severity of the depressive symptoms  Follow-up [place]. Go on 09/15/2018.   Why:  [facility] got to [facility].  Contact information: [facility] [address] [place], [place] [address] [phone]. [phone].           Plan Of Care/Follow-up recommendations:  Activity:  Activity as tolerated Diet:  Regular diet Other:  Follow-up with [facility] if desiring to make a change and get involved in sobriety.  Do not continue to miss use amphetamines or other drugs  Alethia Berthold, MD 09/15/2018, 11:33 AM

## 2018-09-15 NOTE — Progress Notes (Signed)
D:Patient denies SI/HI/avh, can contract for safety of the unit at this time. Pt appears calm and cooperative, and no distress noted. Pt. Denies pain. Pt. Given extensive discharge education.   A: All Personal items in locker returned to pt. Pt escorted out of the building by this Probation officer.   R:  Pt States he will comply with plan put into place for discharge, and take MEDS as prescribed.

## 2018-09-15 NOTE — H&P (Signed)
Psychiatric Admission Assessment Adult  Patient Identification: Tyler Martinez MRN:  [id number] Date of Evaluation:  09/15/2018 Chief Complaint:  Amphetamine and psychostimulant-induced psychosis with delusions  Principal Diagnosis: Amphetamine and psychostimulant-induced psychosis with delusions ([place]) Diagnosis:  Principal Problem:   Amphetamine and psychostimulant-induced psychosis with delusions ([place])  History of Present Illness: Patient presented to the emergency room intoxicated agitated confused disorganized paranoid poor self-care.  Positive for amphetamines.  Appeared to be abusing amphetamines possibly other drugs.  Unable to care for himself.  Did not express direct suicidal ideation but made vague disorganized statements. Associated Signs/Symptoms: Depression Symptoms:  psychomotor agitation, difficulty concentrating, impaired memory, anxiety, (Hypo) Manic Symptoms:  Distractibility, Flight of Ideas, Impulsivity, Irritable Mood, Anxiety Symptoms:  Excessive Worry, Psychotic Symptoms:  Ideas of Reference, Paranoia, PTSD Symptoms: Negative Total Time spent with patient: 1 hour  Past Psychiatric History: Past history of substance abuse treatment and hospitalization for detox.  Is the patient at risk to self? Yes.    Has the patient been a risk to self in the past 6 months? Yes.    Has the patient been a risk to self within the distant past? Yes.    Is the patient a risk to others? No.  Has the patient been a risk to others in the past 6 months? No.  Has the patient been a risk to others within the distant past? No.   Prior Inpatient Therapy:   Prior Outpatient Therapy:    Alcohol Screening: 1. How often do you have a drink containing alcohol?: Never 2. How many drinks containing alcohol do you have on a typical day when you are drinking?: 1 or 2 3. How often do you have six or more drinks on one occasion?: Never AUDIT-C Score: 0 4. How often during the last year have  you found that you were not able to stop drinking once you had started?: Never 5. How often during the last year have you failed to do what was normally expected from you becasue of drinking?: Never 6. How often during the last year have you needed a first drink in the morning to get yourself going after a heavy drinking session?: Never 7. How often during the last year have you had a feeling of guilt of remorse after drinking?: Never 8. How often during the last year have you been unable to remember what happened the night before because you had been drinking?: Never 9. Have you or someone else been injured as a result of your drinking?: No 10. Has a relative or friend or a doctor or another health worker been concerned about your drinking or suggested you cut down?: No Alcohol Use Disorder Identification Test Final Score (AUDIT): 0 Intervention/Follow-up: AUDIT Score <7 follow-up not indicated Substance Abuse History in the last 12 months:  Yes.   Consequences of Substance Abuse: Medical Consequences:  Worsening mental health problems and hospitalization Previous Psychotropic Medications: Yes  Psychological Evaluations: Yes  Past Medical History:  Past Medical History:  Diagnosis Date  . Cardiac arrest ([place]) 2008  . MI (myocardial infarction) [facility])     Past Surgical History:  Procedure Laterality Date  . CAROTID STENT  2008  . EXPLORATION POST OPERATIVE OPEN HEART  2008   Family History: History reviewed. No pertinent family history. Family Psychiatric  History: Patient has no family history noted Tobacco Screening: Have you used any form of tobacco in the last 30 days? (Cigarettes, Smokeless Tobacco, Cigars, and/or Pipes): Yes Tobacco use, Select all that  apply: 5 or more cigarettes per day Are you interested in Tobacco Cessation Medications?: No, patient refused Counseled patient on smoking cessation including recognizing danger situations, developing coping skills and basic  information about quitting provided: Refused/Declined practical counseling Social History:  Social History   Substance and Sexual Activity  Alcohol Use Never  . Frequency: Never     Social History   Substance and Sexual Activity  Drug Use Never    Additional Social History: Marital status: Single Are you sexually active?: No What is your sexual orientation?: hetersosexual Does patient have children?: Yes How many children?: 2 How is patient's relationship with their children?: good                         Allergies:  Not on File Lab Results:  Results for orders placed or performed during the hospital encounter of 09/12/18 (from the past 48 hour(s))  Glucose, capillary     Status: None   Collection Time: 09/13/18  6:39 PM  Result Value Ref Range   Glucose-Capillary 98 70 - 99 mg/dL    Blood Alcohol level:  Lab Results  Component Value Date   ETH <10 07/37/1062    Metabolic Disorder Labs:  Lab Results  Component Value Date   HGBA1C 6.7 (H) 09/13/2018   MPG 145.59 09/13/2018   No results found for: PROLACTIN Lab Results  Component Value Date   CHOL 137 09/13/2018   TRIG 112 09/13/2018   HDL 38 (L) 09/13/2018   CHOLHDL 3.6 09/13/2018   VLDL 22 09/13/2018   LDLCALC 77 09/13/2018    Current Medications: Current Facility-Administered Medications  Medication Dose Route Frequency Provider Last Rate Last Dose  . acetaminophen (TYLENOL) tablet 650 mg  650 mg Oral Q6H PRN Audre Cenci, [name], MD   650 mg at 09/14/18 1747  . alum & mag hydroxide-simeth (MAALOX/MYLANTA) 200-200-20 MG/5ML suspension 30 mL  30 mL Oral Q4H PRN Hank Walling T, MD      . atorvastatin (LIPITOR) tablet 40 mg  40 mg Oral Daily Glema Takaki, [name], MD   40 mg at 09/15/18 6948  . carvedilol (COREG) tablet 12.5 mg  12.5 mg Oral Daily Lakara Weiland, [name], MD   12.5 mg at 09/15/18 5462  . clopidogrel (PLAVIX) tablet 75 mg  75 mg Oral Daily Nguyet Mercer, [name], MD   75 mg at 09/15/18 7035  . glimepiride  (AMARYL) tablet 4 mg  4 mg Oral Daily Taneshia Lorence, [name], MD   4 mg at 09/15/18 0093  . lisinopril (PRINIVIL,ZESTRIL) tablet 20 mg  20 mg Oral Daily Vaidehi Braddy T, MD   20 mg at 09/15/18 8182   And  . hydrochlorothiazide (HYDRODIURIL) tablet 25 mg  25 mg Oral Daily Yasaman Kolek, [name], MD   25 mg at 09/15/18 9937  . ibuprofen (ADVIL,MOTRIN) tablet 400 mg  400 mg Oral Q6H PRN He, Jun, MD   400 mg at 09/14/18 1444  . magnesium hydroxide (MILK OF MAGNESIA) suspension 30 mL  30 mL Oral Daily PRN [name] T, MD      . metFORMIN (GLUCOPHAGE) tablet 1,000 mg  1,000 mg Oral BID WC Crysta Gulick, [name], MD   1,000 mg at 09/15/18 1696  . nicotine (NICODERM CQ - dosed in mg/24 hours) patch 21 mg  21 mg Transdermal Daily Tamas Suen, [name], MD   21 mg at 09/15/18 [phone]  . QUEtiapine (SEROQUEL) tablet 100 mg  100 mg Oral QHS Oda Placke  T, MD   100 mg at 09/14/18 2133  . QUEtiapine (SEROQUEL) tablet 50 mg  50 mg Oral TID PRN Christohper Dube, [name], MD   50 mg at 09/14/18 2357  . traZODone (DESYREL) tablet 50 mg  50 mg Oral QHS He, Jun, MD   50 mg at 09/14/18 2133   PTA Medications: Medications Prior to Admission  Medication Sig Dispense Refill Last Dose  . amphetamine-dextroamphetamine (ADDERALL) 20 MG tablet Take 20 mg by mouth 2 (two) times daily.     [name] atorvastatin (LIPITOR) 40 MG tablet Take 40 mg by mouth daily.     [name] buPROPion (WELLBUTRIN XL) 300 MG 24 hr tablet Take 300 mg by mouth daily.     . carvedilol (COREG) 12.5 MG tablet Take 12.5 mg by mouth daily.     . clopidogrel (PLAVIX) 75 MG tablet Take 75 mg by mouth daily.     [name] gabapentin (NEURONTIN) 800 MG tablet Take 800 mg by mouth 3 (three) times daily.     [name] glimepiride (AMARYL) 4 MG tablet Take 4 mg by mouth daily.     [name] lisinopril-hydrochlorothiazide (PRINZIDE,ZESTORETIC) 20-25 MG tablet Take 1 tablet by mouth daily.     . metFORMIN (GLUCOPHAGE) 500 MG tablet Take 1,000 mg by mouth 2 (two) times daily with a meal.     . QUEtiapine (SEROQUEL) 100 MG tablet  Take 100 mg by mouth at bedtime.       Musculoskeletal: Strength & Muscle Tone: within normal limits Gait & Station: normal Patient leans: N/A  Psychiatric Specialty Exam: Physical Exam  ROS  Blood pressure (!) 120/92, pulse 81, temperature 98.3 F (36.8 C), temperature source Oral, resp. rate 17, height 6' (1.829 m), weight 94.3 kg, SpO2 98 %.Body mass index is 28.2 kg/m.  General Appearance: Disheveled  Eye Contact:  Minimal  Speech:  Garbled  Volume:  Decreased  Mood:  Dysphoric  Affect:  Congruent  Thought Process:  Disorganized  Orientation:  Full (Time, Place, and Person)  Thought Content:  Illogical  Suicidal Thoughts:  No  Homicidal Thoughts:  No  Memory:  Immediate;   Fair Recent;   Fair Remote;   Fair  Judgement:  Impaired  Insight:  Shallow  Psychomotor Activity:  Restlessness  Concentration:  Concentration: Poor  Recall:  Poor  Fund of Knowledge:  Fair  Language:  Fair  Akathisia:  No  Handed:  Right  AIMS (if indicated):     Assets:  Physical Health Resilience Social Support  ADL's:  Impaired  Cognition:  Impaired,  Mild  Sleep:  Number of Hours: 4    Treatment Plan Summary: Daily contact with patient to assess and evaluate symptoms and progress in treatment, Medication management and Plan Admitted to the psychiatric ward 15-minute checks in place.  Full set of labs to be done.  Reevaluate after he has rested and sobered up.  Engage in appropriate treatment on the unit.  Observation Level/Precautions:  15 minute checks  Laboratory:  UDS  Psychotherapy:    Medications:    Consultations:    Discharge Concerns:    Estimated LOS:  Other:     Physician Treatment Plan for Primary Diagnosis: Amphetamine and psychostimulant-induced psychosis with delusions ([place]) Long Term Goal(s): Improvement in symptoms so as ready for discharge  Short Term Goals: Ability to demonstrate self-control will improve  Physician Treatment Plan for Secondary Diagnosis:  Principal Problem:   Amphetamine and psychostimulant-induced psychosis with delusions ([place])  Long Term Goal(s): Improvement in symptoms  so as ready for discharge  Short Term Goals: Ability to identify triggers associated with substance abuse/mental health issues will improve  I certify that inpatient services furnished can reasonably be expected to improve the patient's condition.    Alethia Berthold, MD 1/20/202011:35 AM

## 2018-09-15 NOTE — Progress Notes (Signed)
Recreation Therapy Notes  Date: 09/15/2018  Time: 9:30 am   Location: Craft room   Behavioral response: N/A   Intervention Topic: Self-care  Discussion/Intervention: Patient did not attend group.   Clinical Observations/Feedback:  Patient did not attend group.   Beya Tipps LRT/CTRS         Anslie Spadafora 09/15/2018 10:20 AM

## 2018-09-15 NOTE — Progress Notes (Signed)
  [facility] Adult Case Management Discharge Plan :  Will you be returning to the same living situation after discharge:  Yes,  pt is returning to his home.  Pt has been infomred of the [employer] and given a list of providers in the [place] area. At discharge, do you have transportation home?: Yes,  pt's brother is providing transportation. Do you have the ability to pay for your medications: Yes,  Medicare  Release of information consent forms completed and in the chart;  Patient's signature needed at discharge.  Patient to Follow up at: Follow-up [place]. Go on 09/15/2018.   Why:  Please got to the [facility] today.  Contact information: [facility] [address] [place], [place] [address] [phone]. [phone].           Next level of care provider has access to [place] and Suicide Prevention discussed: Yes,  SPE completed with pt's brother.  Have you used any form of tobacco in the last 30 days? (Cigarettes, Smokeless Tobacco, Cigars, and/or Pipes): Yes  Has patient been referred to the Quitline?: Patient refused referral  Patient has been referred for addiction treatment: Yes  Rozann Lesches, LCSW 09/15/2018, 10:29 AM

## 2018-09-15 NOTE — Tx Team (Signed)
Interdisciplinary Treatment and Diagnostic Plan Update  09/15/2018 Time of Session: 9:15AM [name] MRN: [id number]  Principal Diagnosis: Amphetamine and psychostimulant-induced psychosis with delusions [facility])  Secondary Diagnoses: Principal Problem:   Amphetamine and psychostimulant-induced psychosis with delusions ([place])   Current Medications:  Current Facility-Administered Medications  Medication Dose Route Frequency Provider Last Rate Last Dose  . acetaminophen (TYLENOL) tablet 650 mg  650 mg Oral Q6H PRN Clapacs, [name], MD   650 mg at 09/14/18 1747  . alum & mag hydroxide-simeth (MAALOX/MYLANTA) 200-200-20 MG/5ML suspension 30 mL  30 mL Oral Q4H PRN Clapacs, [name], MD      . atorvastatin (LIPITOR) tablet 40 mg  40 mg Oral Daily Clapacs, [name], MD   40 mg at 09/15/18 3329  . carvedilol (COREG) tablet 12.5 mg  12.5 mg Oral Daily Clapacs, [name], MD   12.5 mg at 09/15/18 5188  . clopidogrel (PLAVIX) tablet 75 mg  75 mg Oral Daily Clapacs, [name], MD   75 mg at 09/15/18 4166  . glimepiride (AMARYL) tablet 4 mg  4 mg Oral Daily Clapacs, [name], MD   4 mg at 09/15/18 0630  . lisinopril (PRINIVIL,ZESTRIL) tablet 20 mg  20 mg Oral Daily Clapacs, [name], MD   20 mg at 09/15/18 1601   And  . hydrochlorothiazide (HYDRODIURIL) tablet 25 mg  25 mg Oral Daily Clapacs, [name], MD   25 mg at 09/15/18 0932  . ibuprofen (ADVIL,MOTRIN) tablet 400 mg  400 mg Oral Q6H PRN He, Jun, MD   400 mg at 09/14/18 1444  . magnesium hydroxide (MILK OF MAGNESIA) suspension 30 mL  30 mL Oral Daily PRN Clapacs, [name], MD      . metFORMIN (GLUCOPHAGE) tablet 1,000 mg  1,000 mg Oral BID WC Clapacs, [name], MD   1,000 mg at 09/15/18 3557  . nicotine (NICODERM CQ - dosed in mg/24 hours) patch 21 mg  21 mg Transdermal Daily Clapacs, [name], MD   21 mg at 09/15/18 [phone]  . QUEtiapine (SEROQUEL) tablet 100 mg  100 mg Oral QHS Clapacs, [name], MD   100 mg at 09/14/18 2133  . QUEtiapine (SEROQUEL) tablet 50 mg  50 mg Oral  TID PRN Clapacs, [name], MD   50 mg at 09/14/18 2357  . traZODone (DESYREL) tablet 50 mg  50 mg Oral QHS He, Jun, MD   50 mg at 09/14/18 2133   PTA Medications: Medications Prior to Admission  Medication Sig Dispense Refill Last Dose  . amphetamine-dextroamphetamine (ADDERALL) 20 MG tablet Take 20 mg by mouth 2 (two) times daily.     [name] atorvastatin (LIPITOR) 40 MG tablet Take 40 mg by mouth daily.     [name] buPROPion (WELLBUTRIN XL) 300 MG 24 hr tablet Take 300 mg by mouth daily.     . carvedilol (COREG) 12.5 MG tablet Take 12.5 mg by mouth daily.     . clopidogrel (PLAVIX) 75 MG tablet Take 75 mg by mouth daily.     [name] gabapentin (NEURONTIN) 800 MG tablet Take 800 mg by mouth 3 (three) times daily.     [name] glimepiride (AMARYL) 4 MG tablet Take 4 mg by mouth daily.     [name] lisinopril-hydrochlorothiazide (PRINZIDE,ZESTORETIC) 20-25 MG tablet Take 1 tablet by mouth daily.     . metFORMIN (GLUCOPHAGE) 500 MG tablet Take 1,000 mg by mouth 2 (two) times daily with a meal.     . QUEtiapine (SEROQUEL) 100 MG tablet  Take 100 mg by mouth at bedtime.       Patient Stressors: Health problems Medication change or noncompliance Substance abuse  Patient Strengths: Ability for insight Motivation for treatment/growth Supportive family/friends  Treatment Modalities: Medication Management, Group therapy, Case management,  1 to 1 session with clinician, Psychoeducation, Recreational therapy.   Physician Treatment Plan for Primary Diagnosis: Amphetamine and psychostimulant-induced psychosis with delusions ([place]) Long Term Goal(s):     Short Term Goals:    Medication Management: Evaluate patient's response, side effects, and tolerance of medication regimen.  Therapeutic Interventions: 1 to 1 sessions, Unit Group sessions and Medication administration.  Evaluation of Outcomes: Adequate for Discharge  Physician Treatment Plan for Secondary Diagnosis: Principal Problem:   Amphetamine and  psychostimulant-induced psychosis with delusions ([place])  Long Term Goal(s):     Short Term Goals:       Medication Management: Evaluate patient's response, side effects, and tolerance of medication regimen.  Therapeutic Interventions: 1 to 1 sessions, Unit Group sessions and Medication administration.  Evaluation of Outcomes: Adequate for Discharge   RN Treatment Plan for Primary Diagnosis: Amphetamine and psychostimulant-induced psychosis with delusions ([place]) Long Term Goal(s): Knowledge of disease and therapeutic regimen to maintain health will improve  Short Term Goals: Ability to verbalize frustration and anger appropriately will improve, Ability to demonstrate self-control, Ability to verbalize feelings will improve, Ability to identify and develop effective coping behaviors will improve and Compliance with prescribed medications will improve  Medication Management: RN will administer medications as ordered by provider, will assess and evaluate patient's response and provide education to patient for prescribed medication. RN will report any adverse and/or side effects to prescribing provider.  Therapeutic Interventions: 1 on 1 counseling sessions, Psychoeducation, Medication administration, Evaluate responses to treatment, Monitor vital signs and CBGs as ordered, Perform/monitor CIWA, COWS, AIMS and Fall Risk screenings as ordered, Perform wound care treatments as ordered.  Evaluation of Outcomes: Adequate for Discharge   LCSW Treatment Plan for Primary Diagnosis: Amphetamine and psychostimulant-induced psychosis with delusions ([place]) Long Term Goal(s): Safe transition to appropriate next level of care at discharge, Engage patient in therapeutic group addressing interpersonal concerns.  Short Term Goals: Engage patient in aftercare planning with referrals and resources, Increase social support, Increase ability to appropriately verbalize feelings, Increase emotional regulation,  Identify triggers associated with mental health/substance abuse issues and Increase skills for wellness and recovery  Therapeutic Interventions: Assess for all discharge needs, 1 to 1 time with Social worker, Explore available resources and support systems, Assess for adequacy in community support network, Educate family and significant other(s) on suicide prevention, Complete Psychosocial Assessment, Interpersonal group therapy.  Evaluation of Outcomes: Adequate for Discharge   Progress in Treatment: Attending groups: Yes. Participating in groups: Yes. Taking medication as prescribed: Yes. Toleration medication: Yes. Family/Significant other contact made: Yes, individual(s) contacted:  SPE completed with the pt's brother. Patient understands diagnosis: Yes. Discussing patient identified problems/goals with staff: Yes. Medical problems stabilized or resolved: Yes. Denies suicidal/homicidal ideation: Yes. Issues/concerns per patient self-inventory: No. Other: none  New problem(s) identified: No, Describe:  none  New Short Term/Long Term Goal(s): detox, medication management for mood stabilization; development of comprehensive mental wellness/sobriety plan.  Patient Goals:  "get my medicine right and quit smoking"  Discharge Plan or Barriers: Pt reports that brother will provide transportation.  CSW confirmed with pt's brother.  Pt is currently homeless and information provided on local resources.    Reason for Continuation of Hospitalization: Aggression Anxiety Depression Medication stabilization  Estimated  Length of Stay:  Attendees: Patient: Tyler Martinez 09/15/2018 9:23 AM  Physician: Dr. Weber Cooks, MD 09/15/2018 9:23 AM  Nursing:  09/15/2018 9:23 AM  RN Care Manager: 09/15/2018 9:23 AM  Social Worker: Assunta Curtis, MSW, LCSW 09/15/2018 9:23 AM  Recreational Therapist:  09/15/2018 9:23 AM  Other:  09/15/2018 9:23 AM  Other:  09/15/2018 9:23 AM  Other: 09/15/2018 9:23 AM     Scribe for Treatment Team: Rozann Lesches, LCSW 09/15/2018 9:23 AM

## 2018-09-15 NOTE — Discharge Summary (Signed)
Physician Discharge Summary Note  Patient:  Tyler Martinez is an 48 y.o., male MRN:  [id number] DOB:  Jul 03, 1971 Patient phone:  [phone] (home)  Patient address:   [place] [address],  Total Time spent with patient: 45 minutes  Date of Admission:  09/12/2018 Date of Discharge: September 15, 2018  Reason for Admission: Admitted through the emergency room where he presented with agitated disorganized thinking poor self-care irritability near psychosis all probably related to substance abuse.  Poor self-care poor ability to make any plans for himself  Principal Problem: Amphetamine and psychostimulant-induced psychosis with delusions [facility]) Discharge Diagnoses: Principal Problem:   Amphetamine and psychostimulant-induced psychosis with delusions [facility])   Past Psychiatric History: History of substance abuse several prior rehabs failure to follow-up with outpatient treatment.  Patient has been treated for "ADHD" in the past and given amphetamines which she appears to continually abuse  Past Medical History:  Past Medical History:  Diagnosis Date  . Cardiac arrest ([place]) 2008  . MI (myocardial infarction) [facility])     Past Surgical History:  Procedure Laterality Date  . CAROTID STENT  2008  . EXPLORATION POST OPERATIVE OPEN HEART  2008   Family History: History reviewed. No pertinent family history. Family Psychiatric  History: None reported Social History:  Social History   Substance and Sexual Activity  Alcohol Use Never  . Frequency: Never     Social History   Substance and Sexual Activity  Drug Use Never    Social History   Socioeconomic History  . Marital status: Single    Spouse name: Not on file  . Number of children: Not on file  . Years of education: Not on file  . Highest education level: Not on file  Occupational History  . Not on file  Social Needs  . Financial resource strain: Not on file  . Food insecurity:    Worry: Not on file     Inability: Not on file  . Transportation needs:    Medical: Not on file    Non-medical: Not on file  Tobacco Use  . Smoking status: Current Every Day Smoker    Packs/day: 3.00    Types: Cigarettes  . Smokeless tobacco: Never Used  Substance and Sexual Activity  . Alcohol use: Never    Frequency: Never  . Drug use: Never  . Sexual activity: Not on file  Lifestyle  . Physical activity:    Days per week: Not on file    Minutes per session: Not on file  . Stress: Not on file  Relationships  . Social connections:    Talks on phone: Not on file    Gets together: Not on file    Attends religious service: Not on file    Active member of club or organization: Not on file    Attends meetings of clubs or organizations: Not on file    Relationship status: Not on file  Other Topics Concern  . Not on file  Social History Narrative  . Not on file    Hospital Course: Patient admitted to the psychiatric ward.  No amphetamines prescribed.  Patient was given medicine for his blood pressure and diabetes which came under better control.  Patient was given education and supportive therapy and encouragement regarding substance abuse but showed little motivation or insight.  Patient did not show any suicidal or dangerous behavior was not violent or physically threatening in the hospital.  Doctor on-call over the weekend did contact  with the patient's consent his outpatient provider and inform her by voicemail that the patient had been abusing amphetamines.  As of today the patient is not showing any active symptoms not dangerous not meeting commitment criteria.  He will be discharged from the hospital.  He has met with the coordinator for [place] and will be strongly encouraged to them involved himself in substance abuse treatment.  Only prescriptions are for appropriate medical uses such as his blood pressure and diabetes  Physical Findings: AIMS:  , ,  ,  ,    CIWA:    COWS:      Musculoskeletal: Strength & Muscle Tone: within normal limits Gait & Station: normal Patient leans: N/A  Psychiatric Specialty Exam: Physical Exam  Nursing note and vitals reviewed. Constitutional: He appears well-developed and well-nourished.  HENT:  Head: Normocephalic and atraumatic.  Eyes: Pupils are equal, round, and reactive to light. Conjunctivae are normal.  Neck: Normal range of motion.  Cardiovascular: Normal heart sounds.  Respiratory: Effort normal.  GI: Soft.  Musculoskeletal: Normal range of motion.  Neurological: He is alert.  Skin: Skin is warm and dry.  Psychiatric: He has a normal mood and affect. His behavior is normal. Judgment and thought content normal.    Review of Systems  Constitutional: Negative.   HENT: Negative.   Eyes: Negative.   Respiratory: Negative.   Cardiovascular: Negative.   Gastrointestinal: Negative.   Musculoskeletal: Negative.   Skin: Negative.   Neurological: Negative.   Psychiatric/Behavioral: Negative.     Blood pressure (!) 120/92, pulse 81, temperature 98.3 F (36.8 C), temperature source Oral, resp. rate 17, height 6' (1.829 m), weight 94.3 kg, SpO2 98 %.Body mass index is 28.2 kg/m.  General Appearance: Casual  Eye Contact:  Good  Speech:  Clear and Coherent  Volume:  Normal  Mood:  Euthymic  Affect:  Appropriate  Thought Process:  Goal Directed  Orientation:  Full (Time, Place, and Person)  Thought Content:  Logical  Suicidal Thoughts:  No  Homicidal Thoughts:  No  Memory:  Immediate;   Fair Recent;   Fair Remote;   Fair  Judgement:  Impaired  Insight:  Shallow  Psychomotor Activity:  Normal  Concentration:  Concentration: Fair  Recall:  [place] of Knowledge:  Fair  Language:  Fair  Akathisia:  No  Handed:  Right  AIMS (if indicated):     Assets:  Desire for Improvement Housing Physical Health  ADL's:  Intact  Cognition:  WNL  Sleep:  Number of Hours: 4     Have you used any form of tobacco in  the last 30 days? (Cigarettes, Smokeless Tobacco, Cigars, and/or Pipes): Yes  Has this patient used any form of tobacco in the last 30 days? (Cigarettes, Smokeless Tobacco, Cigars, and/or Pipes) Yes, Yes, A prescription for an FDA-approved tobacco cessation medication was offered at discharge and the patient refused  Blood Alcohol level:  Lab Results  Component Value Date   ETH <10 22/63/3354    Metabolic Disorder Labs:  Lab Results  Component Value Date   HGBA1C 6.7 (H) 09/13/2018   MPG 145.59 09/13/2018   No results found for: PROLACTIN Lab Results  Component Value Date   CHOL 137 09/13/2018   TRIG 112 09/13/2018   HDL 38 (L) 09/13/2018   CHOLHDL 3.6 09/13/2018   VLDL 22 09/13/2018   [place] 77 09/13/2018    See Psychiatric Specialty Exam and Suicide Risk Assessment completed by Attending Physician prior to discharge.  Discharge destination:  Home  Is patient on multiple antipsychotic therapies at discharge:  No   Has Patient had three or more failed trials of antipsychotic monotherapy by history:  No  Recommended Plan for Multiple Antipsychotic Therapies: NA  Discharge Instructions    Diet - low sodium heart healthy   Complete by:  As directed    Increase activity slowly   Complete by:  As directed      Allergies as of 09/15/2018   Not on File     Medication List    STOP taking these medications   amphetamine-dextroamphetamine 20 MG tablet Commonly known as:  ADDERALL   buPROPion 300 MG 24 hr tablet Commonly known as:  WELLBUTRIN XL   gabapentin 800 MG tablet Commonly known as:  NEURONTIN   lisinopril-hydrochlorothiazide 20-25 MG tablet Commonly known as:  PRINZIDE,ZESTORETIC     TAKE these medications     Indication  atorvastatin 40 MG tablet Commonly known as:  LIPITOR Take 1 tablet (40 mg total) by mouth daily.  Indication:  High Amount of Fats in the Blood   carvedilol 12.5 MG tablet Commonly known as:  COREG Take 1 tablet (12.5 mg total)  by mouth daily.  Indication:  High Blood Pressure Disorder   clopidogrel 75 MG tablet Commonly known as:  PLAVIX Take 1 tablet (75 mg total) by mouth daily. Start taking on:  September 16, 2018  Indication:  Cerebrovascular Accident or Stroke   glimepiride 4 MG tablet Commonly known as:  AMARYL Take 1 tablet (4 mg total) by mouth daily.  Indication:  Type 2 Diabetes   hydrochlorothiazide 25 MG tablet Commonly known as:  HYDRODIURIL Take 1 tablet (25 mg total) by mouth daily. Start taking on:  September 16, 2018  Indication:  High Blood Pressure Disorder   lisinopril 20 MG tablet Commonly known as:  PRINIVIL,ZESTRIL Take 1 tablet (20 mg total) by mouth daily. Start taking on:  September 16, 2018  Indication:  High Blood Pressure Disorder   metFORMIN 1000 MG tablet Commonly known as:  GLUCOPHAGE Take 1 tablet (1,000 mg total) by mouth 2 (two) times daily with a meal. What changed:  medication strength  Indication:  Type 2 Diabetes   QUEtiapine 100 MG tablet Commonly known as:  SEROQUEL Take 1 tablet (100 mg total) by mouth at bedtime.  Indication:  Agitation      Follow-up [place]. Go on 09/15/2018.   Why:  Please got to the [facility] today.  Contact information: [facility] [address] [place], [place] [address] [phone]. [phone].           Follow-up recommendations:  Activity:  Activity as tolerated Diet:  Heart healthy diet with low carb Other:  Follow-up in the community with [facility] and stop abusing amphetamines and other substances  Comments: Patient being discharged with his brother.  May be planning to stay with him although information has been given about local shelter opportunities as well.  Signed: Alethia Berthold, MD 09/15/2018, 11:43 AM

## 2018-09-15 NOTE — [facility] Suicide Risk Assessment (Signed)
[facility] Admission Suicide Risk Assessment   Nursing information obtained from:  Patient Demographic factors:  Male Current Mental Status:  NA Loss Factors:  NA Historical Factors:  NA Risk Reduction Factors:  Positive social support  Total Time spent with patient: 1 hour Principal Problem: Amphetamine and psychostimulant-induced psychosis with delusions ([place]) Diagnosis:  Principal Problem:   Amphetamine and psychostimulant-induced psychosis with delusions ([place])  Subjective Data: Patient admitted through the emergency room with anxious confused bizarre disorganized behavior poor self-care but denies any intention to kill himself or wish to die  Continued Clinical Symptoms:  Alcohol Use Disorder Identification Test Final Score (AUDIT): 0 The "Alcohol Use Disorders Identification Test", Guidelines for Use in Primary Care, Second Edition.  World Pharmacologist [facility]). Score between 0-7:  no or low risk or alcohol related problems. Score between 8-15:  moderate risk of alcohol related problems. Score between 16-19:  high risk of alcohol related problems. Score 20 or above:  warrants further diagnostic evaluation for alcohol dependence and treatment.   CLINICAL FACTORS:   Alcohol/Substance Abuse/Dependencies   Musculoskeletal: Strength & Muscle Tone: within normal limits Gait & Station: normal Patient leans: N/A  Psychiatric Specialty Exam: Physical Exam  Nursing note and vitals reviewed. Constitutional: He appears well-developed and well-nourished.  HENT:  Head: Normocephalic and atraumatic.  Eyes: Pupils are equal, round, and reactive to light. Conjunctivae are normal.  Neck: Normal range of motion.  Cardiovascular: Normal heart sounds.  Respiratory: Effort normal.  GI: Soft.  Musculoskeletal: Normal range of motion.  Neurological: He is alert.  Skin: Skin is warm and dry.  Psychiatric: His mood appears anxious. His speech is tangential. He is agitated. Thought content is  paranoid. Cognition and memory are impaired. He expresses impulsivity.    Review of Systems  Constitutional: Negative.   HENT: Negative.   Eyes: Negative.   Respiratory: Negative.   Cardiovascular: Negative.   Gastrointestinal: Negative.   Musculoskeletal: Negative.   Skin: Negative.   Neurological: Negative.   Psychiatric/Behavioral: The patient is nervous/anxious.     Blood pressure (!) 120/92, pulse 81, temperature 98.3 F (36.8 C), temperature source Oral, resp. rate 17, height 6' (1.829 m), weight 94.3 kg, SpO2 98 %.Body mass index is 28.2 kg/m.  General Appearance: Disheveled  Eye Contact:  Minimal  Speech:  Garbled  Volume:  Increased  Mood:  Dysphoric  Affect:  Labile  Thought Process:  Disorganized  Orientation:  Full (Time, Place, and Person)  Thought Content:  Illogical  Suicidal Thoughts:  No  Homicidal Thoughts:  No  Memory:  Immediate;   Fair Recent;   Poor Remote;   Poor  Judgement:  Poor  Insight:  Shallow  Psychomotor Activity:  Decreased  Concentration:  Concentration: Fair  Recall:  Poor  Fund of Knowledge:  Fair  Language:  Fair  Akathisia:  No  Handed:  Right  AIMS (if indicated):     Assets:  Housing Physical Health  ADL's:  Impaired  Cognition:  Impaired,  Mild  Sleep:  Number of Hours: 4      COGNITIVE FEATURES THAT CONTRIBUTE TO RISK:  Loss of executive function    SUICIDE RISK:   Minimal: No identifiable suicidal ideation.  Patients presenting with no risk factors but with morbid ruminations; may be classified as minimal risk based on the severity of the depressive symptoms  PLAN OF CARE: Patient to be admitted to the hospital because of disorganized psychotic behavior and poor self-care but does not have any active intention  plan or wish to kill himself.  Patient will be fully evaluated and treated appropriately with a proper safety plan prior to discharge  I certify that inpatient services furnished can reasonably be expected to  improve the patient's condition.   Alethia Berthold, MD 09/15/2018, 11:31 AM

## 2018-09-15 NOTE — [facility] Suicide Risk Assessment (Signed)
[place] INPATIENT:  Family/Significant Other Suicide Prevention Education  Suicide Prevention Education:  Education Completed; Joni Reining, brother, [phone] has been identified by the patient as the family member/significant other with whom the patient will be residing, and identified as the person(s) who will aid the patient in the event of a mental health crisis (suicidal ideations/suicide attempt).  With written consent from the patient, the family member/significant other has been provided the following suicide prevention education, prior to the and/or following the discharge of the patient.  The suicide prevention education provided includes the following:  Suicide risk factors  Suicide prevention and interventions  National Suicide Hotline telephone number  [facility] assessment telephone number  [facility] Emergency Assistance [place] and/or Residential Mobile Crisis Unit telephone number  Request made of family/significant other to:  Remove weapons (e.g., guns, rifles, knives), all items previously/currently identified as safety concern.    Remove drugs/medications (over-the-counter, prescriptions, illicit drugs), all items previously/currently identified as a safety concern.  The family member/significant other verbalizes understanding of the suicide prevention education information provided.  The family member/significant other agrees to remove the items of safety concern listed above.  Pt's brother reports that the patient is abusing medication "he has learned to take this with that to get that certain high".  Brother reports that the patient can not return to the home and requested that patient be provided information on homeless shelters.     Rozann Lesches 09/15/2018, 9:05 AM

## 2018-12-21 ENCOUNTER — Other Ambulatory Visit: Payer: Self-pay | Admitting: Psychiatry

## 2019-02-09 ENCOUNTER — Other Ambulatory Visit: Payer: Self-pay | Admitting: Psychiatry

## 2019-03-19 ENCOUNTER — Other Ambulatory Visit: Payer: Self-pay | Admitting: Psychiatry

## 2019-03-27 ENCOUNTER — Other Ambulatory Visit: Payer: Self-pay | Admitting: Psychiatry

## 2019-04-14 ENCOUNTER — Other Ambulatory Visit: Payer: Self-pay

## 2019-04-14 ENCOUNTER — Ambulatory Visit (INDEPENDENT_AMBULATORY_CARE_PROVIDER_SITE_OTHER): Payer: Medicare HMO | Admitting: Family Medicine

## 2019-04-14 ENCOUNTER — Encounter: Payer: Self-pay | Admitting: Family Medicine

## 2019-04-14 VITALS — BP 145/95 | HR 79 | Temp 98.6°F | Resp 16 | Ht 72.0 in | Wt 214.0 lb

## 2019-04-14 DIAGNOSIS — I10 Essential (primary) hypertension: Secondary | ICD-10-CM | POA: Diagnosis not present

## 2019-04-14 DIAGNOSIS — I2510 Atherosclerotic heart disease of native coronary artery without angina pectoris: Secondary | ICD-10-CM

## 2019-04-14 DIAGNOSIS — G4709 Other insomnia: Secondary | ICD-10-CM

## 2019-04-14 DIAGNOSIS — F1510 Other stimulant abuse, uncomplicated: Secondary | ICD-10-CM

## 2019-04-14 DIAGNOSIS — E1142 Type 2 diabetes mellitus with diabetic polyneuropathy: Secondary | ICD-10-CM | POA: Insufficient documentation

## 2019-04-14 DIAGNOSIS — E1159 Type 2 diabetes mellitus with other circulatory complications: Secondary | ICD-10-CM | POA: Diagnosis not present

## 2019-04-14 DIAGNOSIS — F1110 Opioid abuse, uncomplicated: Secondary | ICD-10-CM

## 2019-04-14 DIAGNOSIS — M069 Rheumatoid arthritis, unspecified: Secondary | ICD-10-CM | POA: Insufficient documentation

## 2019-04-14 DIAGNOSIS — M2140 Flat foot [pes planus] (acquired), unspecified foot: Secondary | ICD-10-CM | POA: Insufficient documentation

## 2019-04-14 DIAGNOSIS — E1169 Type 2 diabetes mellitus with other specified complication: Secondary | ICD-10-CM

## 2019-04-14 DIAGNOSIS — M2141 Flat foot [pes planus] (acquired), right foot: Secondary | ICD-10-CM

## 2019-04-14 DIAGNOSIS — E785 Hyperlipidemia, unspecified: Secondary | ICD-10-CM

## 2019-04-14 DIAGNOSIS — M2142 Flat foot [pes planus] (acquired), left foot: Secondary | ICD-10-CM

## 2019-04-14 DIAGNOSIS — I6930 Unspecified sequelae of cerebral infarction: Secondary | ICD-10-CM

## 2019-04-14 DIAGNOSIS — I69319 Unspecified symptoms and signs involving cognitive functions following cerebral infarction: Secondary | ICD-10-CM | POA: Insufficient documentation

## 2019-04-14 MED ORDER — METFORMIN HCL 1000 MG PO TABS: 1000.0000 mg | ORAL_TABLET | Freq: Two times a day (BID) | ORAL | 5 refills | Status: DC

## 2019-04-14 MED ORDER — GABAPENTIN 800 MG PO TABS: 800.0000 mg | ORAL_TABLET | Freq: Four times a day (QID) | ORAL | 5 refills | Status: DC

## 2019-04-14 MED ORDER — ACCU-CHEK GUIDE W/DEVICE KIT: 1.0000 | PACK | Freq: Two times a day (BID) | 0 refills | Status: DC

## 2019-04-14 MED ORDER — ACCU-CHEK MULTICLIX LANCETS MISC: 12 refills | Status: DC

## 2019-04-14 MED ORDER — CLOPIDOGREL BISULFATE 75 MG PO TABS: 75.0000 mg | ORAL_TABLET | Freq: Every day | ORAL | 5 refills | Status: DC

## 2019-04-14 MED ORDER — LISINOPRIL-HYDROCHLOROTHIAZIDE 20-12.5 MG PO TABS: 1.0000 | ORAL_TABLET | Freq: Every day | ORAL | 5 refills | Status: DC

## 2019-04-14 MED ORDER — QUETIAPINE FUMARATE 100 MG PO TABS: 100.0000 mg | ORAL_TABLET | Freq: Every day | ORAL | 5 refills | Status: DC

## 2019-04-14 MED ORDER — ACCU-CHEK GUIDE VI STRP: ORAL_STRIP | 11 refills | Status: DC

## 2019-04-14 MED ORDER — ROSUVASTATIN CALCIUM 10 MG PO TABS: 10.0000 mg | ORAL_TABLET | Freq: Every day | ORAL | 5 refills | Status: DC

## 2019-04-14 MED ORDER — CARVEDILOL 12.5 MG PO TABS: 12.5000 mg | ORAL_TABLET | Freq: Two times a day (BID) | ORAL | 5 refills | Status: DC

## 2019-04-14 NOTE — Assessment & Plan Note (Addendum)
History of multiple CVA with memory deficit and difficulty focus, history of ADD See A&P for CAD for vascular medication, on plavix  Previously on Adderall, however history of complex issue with amphetamine abuse, off medicine since 08/2018

## 2019-04-14 NOTE — Assessment & Plan Note (Addendum)
Elevated BP, still elevated on repeat - Home BP readings none available today  Complication with CAD    Plan:  1. Continue current BP regimen - re order - Lisinopril-HCTZ 20-12.5mg  daily, Carvedilol 12.5mg  BID 2. Encourage improved lifestyle - low sodium diet, regular exercise 3. Start monitor BP outside office, bring readings to next visit, if persistently >140/90 or new symptoms notify office sooner

## 2019-04-14 NOTE — Assessment & Plan Note (Signed)
Previously controlled DM A1c 6.7 (08/3084) Complications - peripheral neuropathy, vascular CAD CVA, hyperlipidemia, depression, - increases risk of future cardiovascular complications - OFF Sulfonylurea glimepiride   Plan:  1. Continue current therapy - Metformin 1000mg  BID 2. Encourage improved lifestyle - low carb, low sugar diet, reduce portion size, continue improving regular exercise 3. Check CBG , bring log to next visit for review 4. Continue ACEi, Statin 5. DM Foot exam done today  - Advised to schedule DM ophtho exam, send record 6. Follow-up 4 weeks

## 2019-04-14 NOTE — Assessment & Plan Note (Signed)
Concern with amphetamine abuse in past, was on adderall from prior PCP Last behavioral health hospitalization with rehab 08/2018, off adderall since  Patient requesting restart. Advised I do not rx controlled rx on first office visit, he understands. Discussed may consider lower dose if needed, otherwise we can discuss further at next visit, will need to review chart and since chart review now have identified concerning red flags regarding his past history on adderall. Will review with him further, will recommend Psychiatry vs [facility] vs [facility] as options for substance management

## 2019-04-14 NOTE — Assessment & Plan Note (Signed)
Chronic history of pain, arthritis vs RA Previous documentation to review Consider lab testing for RA Off opiate now, history of opiate abuse back in 04/2018 Checked [place] CSRS Previously on Tramadol Now improve on Gabapentin refill
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
## 2019-04-14 NOTE — Assessment & Plan Note (Signed)
Concern with history of polysubstance abuse Opiate misuse on hydrocodone 10/325 as rx by prior PCP, patient had prior negative UDS. Discontinued in 04/2018. Prior behavioral health substance rehab in past, last 08/2018 for amphetamine Off meds currently  Discussed may consider PRN short term only Tramadol which is TBD - based on rest of evaluation and future UDS screening. Advised however I cannot rx Hydrocodone. He was offered Pain Management if interested, he declines.

## 2019-04-14 NOTE — Assessment & Plan Note (Signed)
Previously controlled on statin Last lipid panel 08/2018 Elevated ASCVD risk with known CAD, history of CVA  Plan: 1. DC Atorvastatin 40mg  due to myalgia (he is off now for months) - START Rosuvastatin 10mg  nightly - for ASCVD risk 2. Encourage improved lifestyle - low carb/cholesterol, reduce portion size, continue improving regular exercise

## 2019-04-14 NOTE — Patient Instructions (Addendum)
Thank you for coming to the office today.  Ordered Accu Chek machine for glucose checking up to twice a day.  Your provider would like to you have your annual eye exam. Please contact your current eye doctor or here are some good options for you to contact.   [facility]   Address: [address] [place], [place] [address] Phone: [phone]  Website: [email or web address] [address], [place], [place] [address] Phone: [phone] [email or web address]  [facility]  Address: [place], [place], [place] [address] Phone: [phone]   [facility] [address] [place], [place] [address] Phone: [phone]  [facility] Address: [place], [place],  [address]  Phone: [phone]  Finger stick sugar check next time. No full blood draw.  Consider Adderall next time as well.   Please schedule a Follow-up Appointment to: Return in about 4 weeks (around 05/12/2019) for Follow-up DM A1c.  If you have any other questions or concerns, please feel free to call the office or send a message through [place]. You may also schedule an earlier appointment if necessary.  Additionally, you may be receiving a survey about your experience at our office within a few days to 1 week by e-mail or mail. We value your feedback.  Nobie Putnam, DO [place]

## 2019-04-14 NOTE — Assessment & Plan Note (Signed)
Stable chronic CAD s/p MI stent x 3 on med management now Without CP or anginal symptoms No longer followed by Cardiology On Plavix, ACEi, BB, Statin - needs refills currently out  Plan - Agree to re order his cardiac medications today, sent plavix, lisinopril-hctz, carvedilol - Switch from Atorvastatin 40 down to Rosuvastatin 10 due to his myalgia and non adherence to atorvastatin, needs for ASCVD risk reduction - Advised that I would recommend he re-establish with a Cardiologist locally given his heart history, he declines today due to cost and he is doing well he agrees to go see specialist if develops problem

## 2019-04-14 NOTE — Assessment & Plan Note (Signed)
See A&P DM foot exam Complicated feet with abnormal anatomy pes planus, neuropathy Neuropathy improved on gabapentin

## 2019-05-11 ENCOUNTER — Ambulatory Visit: Payer: Medicare HMO | Admitting: Family Medicine

## 2019-05-19 ENCOUNTER — Ambulatory Visit: Payer: Medicare HMO | Admitting: Family Medicine

## 2019-05-24 ENCOUNTER — Other Ambulatory Visit: Payer: Self-pay | Admitting: Psychiatry

## 2019-05-24 DIAGNOSIS — G4709 Other insomnia: Secondary | ICD-10-CM

## 2019-06-03 ENCOUNTER — Ambulatory Visit (INDEPENDENT_AMBULATORY_CARE_PROVIDER_SITE_OTHER): Payer: Medicare HMO | Admitting: Family Medicine

## 2019-06-03 ENCOUNTER — Other Ambulatory Visit: Payer: Self-pay

## 2019-06-03 ENCOUNTER — Encounter: Payer: Self-pay | Admitting: Family Medicine

## 2019-06-03 VITALS — BP 160/94 | HR 83 | Temp 98.6°F | Resp 16 | Ht 72.0 in | Wt 224.8 lb

## 2019-06-03 DIAGNOSIS — I10 Essential (primary) hypertension: Secondary | ICD-10-CM

## 2019-06-03 DIAGNOSIS — F1510 Other stimulant abuse, uncomplicated: Secondary | ICD-10-CM

## 2019-06-03 DIAGNOSIS — Z23 Encounter for immunization: Secondary | ICD-10-CM

## 2019-06-03 DIAGNOSIS — E1159 Type 2 diabetes mellitus with other circulatory complications: Secondary | ICD-10-CM

## 2019-06-03 DIAGNOSIS — G894 Chronic pain syndrome: Secondary | ICD-10-CM

## 2019-06-03 DIAGNOSIS — M069 Rheumatoid arthritis, unspecified: Secondary | ICD-10-CM | POA: Diagnosis not present

## 2019-06-03 MED ORDER — TRAMADOL HCL 50 MG PO TABS: 50.0000 mg | ORAL_TABLET | Freq: Two times a day (BID) | ORAL | 2 refills | Status: DC | PRN

## 2019-06-03 NOTE — Assessment & Plan Note (Signed)
Previously controlled DM A1c 6.7 (08/2018) - due for A1c today will get lab Complications - peripheral neuropathy, vascular CAD CVA, hyperlipidemia, depression, - increases risk of future cardiovascular complications - OFF Sulfonylurea glimepiride   Plan:  1. Continue current therapy - Metformin 1000mg  BID 2. Encourage improved lifestyle - low carb, low sugar diet, reduce portion size, continue improving regular exercise 3. Check CBG , bring log to next visit for review 4. Continue ACEi, Statin - Again Advised to schedule DM ophtho exam, send record F/u 3 months

## 2019-06-03 NOTE — Assessment & Plan Note (Signed)
Elevated BP, still elevated on repeat but improved, attributed to redbull - Home BP readings have been improved Complication with CAD    Plan:  1. Continue current BP regimen - emphasis adhere to meds -  Lisinopril-HCTZ 20-12.5mg  daily, Carvedilol 12.5mg  BID 2. Encourage improved lifestyle - low sodium diet, regular exercise 3. Start monitor BP outside office, bring readings to next visit, if persistently >140/90 or new symptoms notify office sooner

## 2019-06-03 NOTE — Assessment & Plan Note (Signed)
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 I would not be able to rx these for him at this time.
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
## 2019-06-03 NOTE — Assessment & Plan Note (Addendum)
Chronic history of pain, arthritis vs RA Previous documentation to review Consider lab testing for RA Off opiate now, history of opiate abuse back in 04/2018 Checked [place] CSRS Previously on Tramadol  Plan Discussion on controlled pain medication and treatment options. Will defer pain management referral at this time, and proceed with moderate pain control on Tramadol 50mg  1-2 daily PRN monthly rx #60 pills +2 refills up to 3 months, he has reviewed and signed Opioid Contract Agreement today in office, we will offer random UDS for him for adherence  Advised will not plan to increase dose or change to other opioids due to his history  Continue Gabapentin Future consider refer to Rheum vs Pain

## 2019-06-03 NOTE — Patient Instructions (Addendum)
Thank you for coming to the office today.  [facility]       Ground Floor of the [facility] just off the lobby       [place], [place] [address]       Phone: [phone] ---------------------------------------------------  [facility] (All ages) [address], [place], [id number] Phone: [phone] (Option 1) [email or web address]  ----------------------  [facility]) [place] [address] [place], [place] [address] Phone: [phone]  [employer], available walk-in 9am-4pm M-F [place], [place] [address] Hours: [place] (M-F, walk in available) [phone]  ------------------------------------------  Your provider would like to you have your annual eye exam. Please contact your current eye doctor or here are some good options for you to contact.   [facility]   Address: [address] [place], [place] [address] Phone: [phone]  Website: [email or web address] [address] [place], [place] [address] Phone: [phone] [email or web address]  [facility]  Address: [place], [place],  [address] Phone: [phone]   [facility] [address] [place], [place] [address] Phone: [phone]  [facility] Address: [place], [place],  [address]  Phone: [phone]   Please schedule a Follow-up Appointment to: Return in about 3 months (around 09/03/2019) for chronic pain DM A1c.  If you have any other questions or concerns, please feel free to call the office or send a message through [place]. You may also schedule an earlier appointment if necessary.  Additionally, you may be receiving a survey about your experience at our office within a few days to 1 week by e-mail or mail. We value your feedback.  Nobie Putnam, DO [place]

## 2019-06-04 LAB — BASIC METABOLIC PANEL WITH GFR
BUN: 14 mg/dL (ref 7–25)
CO2: 32 mmol/L (ref 20–32)
Calcium: 8.9 mg/dL (ref 8.6–10.3)
Chloride: 105 mmol/L (ref 98–110)
Creat: 0.89 mg/dL (ref 0.60–1.35)
GFR, Est African American: 118 mL/min/{1.73_m2} (ref >=60)
GFR, Est Non African American: 102 mL/min/{1.73_m2} (ref >=60)
Glucose, Bld: 179 mg/dL — ABNORMAL HIGH (ref 65–99)
Potassium: 4.1 mmol/L (ref 3.5–5.3)
Sodium: 142 mmol/L (ref 135–146)

## 2019-06-04 LAB — HEMOGLOBIN A1C
Hgb A1c MFr Bld: 6 % of total Hgb — ABNORMAL HIGH (ref <5.7)
Mean Plasma Glucose: 126 (calc)
eAG (mmol/L): 7 (calc)

## 2019-06-10 ENCOUNTER — Other Ambulatory Visit: Payer: Self-pay | Admitting: Family Medicine

## 2019-06-10 DIAGNOSIS — G894 Chronic pain syndrome: Secondary | ICD-10-CM

## 2019-06-10 DIAGNOSIS — M069 Rheumatoid arthritis, unspecified: Secondary | ICD-10-CM

## 2019-06-10 MED ORDER — TRAMADOL HCL 50 MG PO TABS: 50.0000 mg | ORAL_TABLET | Freq: Two times a day (BID) | ORAL | 2 refills | Status: DC | PRN

## 2019-06-24 ENCOUNTER — Other Ambulatory Visit: Payer: Self-pay | Admitting: Psychiatry

## 2019-06-24 DIAGNOSIS — G4709 Other insomnia: Secondary | ICD-10-CM

## 2019-07-10 ENCOUNTER — Other Ambulatory Visit: Payer: Self-pay | Admitting: Psychiatry

## 2019-07-10 DIAGNOSIS — G4709 Other insomnia: Secondary | ICD-10-CM

## 2019-08-11 ENCOUNTER — Other Ambulatory Visit: Payer: Self-pay | Admitting: Family Medicine

## 2019-08-11 DIAGNOSIS — E1169 Type 2 diabetes mellitus with other specified complication: Secondary | ICD-10-CM

## 2019-08-11 DIAGNOSIS — E785 Hyperlipidemia, unspecified: Secondary | ICD-10-CM

## 2019-08-19 ENCOUNTER — Other Ambulatory Visit: Payer: Self-pay | Admitting: Family Medicine

## 2019-08-19 DIAGNOSIS — I10 Essential (primary) hypertension: Secondary | ICD-10-CM

## 2019-08-19 DIAGNOSIS — I2510 Atherosclerotic heart disease of native coronary artery without angina pectoris: Secondary | ICD-10-CM

## 2019-08-19 NOTE — Telephone Encounter (Signed)
Should not be due until February. Will leave for Dr. Raliegh Ip

## 2019-09-24 ENCOUNTER — Other Ambulatory Visit: Payer: Self-pay | Admitting: Family Medicine

## 2019-09-24 DIAGNOSIS — E1142 Type 2 diabetes mellitus with diabetic polyneuropathy: Secondary | ICD-10-CM

## 2019-09-29 ENCOUNTER — Other Ambulatory Visit: Payer: Self-pay | Admitting: Family Medicine

## 2019-09-29 DIAGNOSIS — E1142 Type 2 diabetes mellitus with diabetic polyneuropathy: Secondary | ICD-10-CM

## 2019-10-11 ENCOUNTER — Other Ambulatory Visit: Payer: Self-pay | Admitting: Family Medicine

## 2019-10-11 DIAGNOSIS — I10 Essential (primary) hypertension: Secondary | ICD-10-CM

## 2019-10-12 ENCOUNTER — Ambulatory Visit (INDEPENDENT_AMBULATORY_CARE_PROVIDER_SITE_OTHER): Payer: Medicare HMO | Admitting: Family Medicine

## 2019-10-12 ENCOUNTER — Other Ambulatory Visit: Payer: Self-pay

## 2019-10-12 ENCOUNTER — Encounter: Payer: Self-pay | Admitting: Family Medicine

## 2019-10-12 VITALS — BP 162/114 | HR 85 | Temp 97.5°F | Resp 16 | Ht 72.0 in | Wt 225.0 lb

## 2019-10-12 DIAGNOSIS — I10 Essential (primary) hypertension: Secondary | ICD-10-CM

## 2019-10-12 DIAGNOSIS — M069 Rheumatoid arthritis, unspecified: Secondary | ICD-10-CM

## 2019-10-12 DIAGNOSIS — E1142 Type 2 diabetes mellitus with diabetic polyneuropathy: Secondary | ICD-10-CM

## 2019-10-12 DIAGNOSIS — E1169 Type 2 diabetes mellitus with other specified complication: Secondary | ICD-10-CM

## 2019-10-12 DIAGNOSIS — G894 Chronic pain syndrome: Secondary | ICD-10-CM | POA: Diagnosis not present

## 2019-10-12 DIAGNOSIS — I6930 Unspecified sequelae of cerebral infarction: Secondary | ICD-10-CM

## 2019-10-12 DIAGNOSIS — I2510 Atherosclerotic heart disease of native coronary artery without angina pectoris: Secondary | ICD-10-CM | POA: Diagnosis not present

## 2019-10-12 DIAGNOSIS — N529 Male erectile dysfunction, unspecified: Secondary | ICD-10-CM | POA: Diagnosis not present

## 2019-10-12 DIAGNOSIS — E785 Hyperlipidemia, unspecified: Secondary | ICD-10-CM

## 2019-10-12 MED ORDER — BUTALBITAL-APAP-CAFFEINE 50-325-40 MG PO TABS: 1.0000 | ORAL_TABLET | Freq: Four times a day (QID) | ORAL | 2 refills | Status: DC | PRN

## 2019-10-12 MED ORDER — GABAPENTIN 800 MG PO TABS: 800.0000 mg | ORAL_TABLET | Freq: Four times a day (QID) | ORAL | 5 refills | Status: DC

## 2019-10-12 MED ORDER — CARVEDILOL 12.5 MG PO TABS: 12.5000 mg | ORAL_TABLET | Freq: Two times a day (BID) | ORAL | 5 refills | Status: DC

## 2019-10-12 MED ORDER — SILDENAFIL CITRATE 20 MG PO TABS: ORAL_TABLET | ORAL | 5 refills | Status: DC

## 2019-10-12 NOTE — Assessment & Plan Note (Signed)
Stable chronic CAD s/p MI stent x 3 on med management now Without CP or anginal symptoms No longer followed by Cardiology On Plavix, ACEi, BB, Statin - needs refills currently out  Plan Continue Plavix, lisinopril-hctz, carvedilol, rosuvastatin 10 - Advised that I would recommend he re-establish with a Cardiologist locally given his heart history, he declines today due to cost and he is doing well he agrees to go see specialist if develops problem

## 2019-10-12 NOTE — Patient Instructions (Addendum)
Thank you for coming to the office today.  For psychiatry / mental health / ADHD  [facility] [place]  [phone]  [place] [facility] [phone]  [facility] [place] [address] [place] [address]  In [place] [phone]   Please schedule a Follow-up Appointment to: Return in about 6 months (around 04/10/2020) for 6 month follow-up DM A1c HTN.  If you have any other questions or concerns, please feel free to call the office or send a message through [place]. You may also schedule an earlier appointment if necessary.  Additionally, you may be receiving a survey about your experience at our office within a few days to 1 week by e-mail or mail. We value your feedback.  Nobie Putnam, DO [place]
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
## 2019-10-12 NOTE — Assessment & Plan Note (Signed)
Chronic history of pain, arthritis vs RA Reviewed prior records Consider lab testing for RA Off opiate now, history of opiate abuse back in 04/2018 Checked [place] CSRS Failed Tramadol  Plan Agree to offer Fioricet PRN use for pain. Discontinue Tramadol. No plans to offer other opiate pain medication, he would need referral to Pain Management or Rheumatology in future Continue Gabapentin

## 2019-10-12 NOTE — Assessment & Plan Note (Signed)
Elevated BP, still elevated on repeat but improved - did not take meds - Home BP readings have been improved Complication with CAD    Plan:  1. Continue current BP regimen - emphasis adhere to meds - refill  Lisinopril-HCTZ 20-12.5mg  daily, Carvedilol 12.5mg  BID 2. Encourage improved lifestyle - low sodium diet, regular exercise 3. Start monitor BP outside office, bring readings to next visit, if persistently >140/90 or new symptoms notify office sooner

## 2019-10-12 NOTE — Assessment & Plan Note (Signed)
Previously controlled on statin Elevated ASCVD risk with known CAD, history of CVA  Plan: Continue Rosuvastatin 10mg  nightly - for ASCVD risk Encourage improved lifestyle - low carb/cholesterol, reduce portion size, continue improving regular exercise

## 2019-10-27 DIAGNOSIS — R079 Chest pain, unspecified: Secondary | ICD-10-CM | POA: Diagnosis not present

## 2019-10-27 DIAGNOSIS — Z7902 Long term (current) use of antithrombotics/antiplatelets: Secondary | ICD-10-CM | POA: Diagnosis not present

## 2019-10-27 DIAGNOSIS — M549 Dorsalgia, unspecified: Secondary | ICD-10-CM | POA: Diagnosis not present

## 2019-10-27 DIAGNOSIS — R29818 Other symptoms and signs involving the nervous system: Secondary | ICD-10-CM | POA: Diagnosis not present

## 2019-10-27 DIAGNOSIS — H539 Unspecified visual disturbance: Secondary | ICD-10-CM | POA: Diagnosis not present

## 2019-10-27 DIAGNOSIS — R2689 Other abnormalities of gait and mobility: Secondary | ICD-10-CM | POA: Diagnosis not present

## 2019-10-27 DIAGNOSIS — I639 Cerebral infarction, unspecified: Secondary | ICD-10-CM | POA: Diagnosis not present

## 2019-10-27 DIAGNOSIS — H532 Diplopia: Secondary | ICD-10-CM | POA: Diagnosis not present

## 2019-10-27 DIAGNOSIS — I69312 Visuospatial deficit and spatial neglect following cerebral infarction: Secondary | ICD-10-CM | POA: Diagnosis not present

## 2019-10-27 DIAGNOSIS — R519 Headache, unspecified: Secondary | ICD-10-CM | POA: Diagnosis not present

## 2019-10-27 DIAGNOSIS — R42 Dizziness and giddiness: Secondary | ICD-10-CM | POA: Diagnosis not present

## 2019-11-20 ENCOUNTER — Other Ambulatory Visit: Payer: Self-pay | Admitting: Family Medicine

## 2019-11-20 DIAGNOSIS — I2510 Atherosclerotic heart disease of native coronary artery without angina pectoris: Secondary | ICD-10-CM

## 2019-12-23 ENCOUNTER — Other Ambulatory Visit: Payer: Self-pay | Admitting: Family Medicine

## 2019-12-23 DIAGNOSIS — G894 Chronic pain syndrome: Secondary | ICD-10-CM

## 2019-12-30 DIAGNOSIS — R9439 Abnormal result of other cardiovascular function study: Secondary | ICD-10-CM | POA: Diagnosis not present

## 2019-12-30 DIAGNOSIS — R0609 Other forms of dyspnea: Secondary | ICD-10-CM | POA: Diagnosis not present

## 2019-12-30 DIAGNOSIS — Z886 Allergy status to analgesic agent status: Secondary | ICD-10-CM | POA: Diagnosis not present

## 2019-12-30 DIAGNOSIS — Z87820 Personal history of traumatic brain injury: Secondary | ICD-10-CM | POA: Diagnosis not present

## 2019-12-30 DIAGNOSIS — I2510 Atherosclerotic heart disease of native coronary artery without angina pectoris: Secondary | ICD-10-CM | POA: Diagnosis not present

## 2019-12-30 DIAGNOSIS — R29818 Other symptoms and signs involving the nervous system: Secondary | ICD-10-CM | POA: Diagnosis not present

## 2019-12-30 DIAGNOSIS — Z888 Allergy status to other drugs, medicaments and biological substances status: Secondary | ICD-10-CM | POA: Diagnosis not present

## 2019-12-30 DIAGNOSIS — Z803 Family history of malignant neoplasm of breast: Secondary | ICD-10-CM | POA: Diagnosis not present

## 2019-12-30 DIAGNOSIS — I214 Non-ST elevation (NSTEMI) myocardial infarction: Secondary | ICD-10-CM | POA: Diagnosis not present

## 2019-12-30 DIAGNOSIS — I25810 Atherosclerosis of coronary artery bypass graft(s) without angina pectoris: Secondary | ICD-10-CM | POA: Diagnosis not present

## 2019-12-30 DIAGNOSIS — E1165 Type 2 diabetes mellitus with hyperglycemia: Secondary | ICD-10-CM | POA: Diagnosis not present

## 2019-12-30 DIAGNOSIS — J988 Other specified respiratory disorders: Secondary | ICD-10-CM | POA: Diagnosis not present

## 2019-12-30 DIAGNOSIS — M069 Rheumatoid arthritis, unspecified: Secondary | ICD-10-CM | POA: Diagnosis not present

## 2019-12-30 DIAGNOSIS — R0789 Other chest pain: Secondary | ICD-10-CM | POA: Diagnosis not present

## 2019-12-30 DIAGNOSIS — R202 Paresthesia of skin: Secondary | ICD-10-CM | POA: Diagnosis not present

## 2019-12-30 DIAGNOSIS — R079 Chest pain, unspecified: Secondary | ICD-10-CM | POA: Diagnosis not present

## 2019-12-30 DIAGNOSIS — Z743 Need for continuous supervision: Secondary | ICD-10-CM | POA: Diagnosis not present

## 2019-12-30 DIAGNOSIS — I517 Cardiomegaly: Secondary | ICD-10-CM | POA: Diagnosis not present

## 2019-12-30 DIAGNOSIS — N179 Acute kidney failure, unspecified: Secondary | ICD-10-CM | POA: Diagnosis not present

## 2019-12-30 DIAGNOSIS — E785 Hyperlipidemia, unspecified: Secondary | ICD-10-CM | POA: Diagnosis not present

## 2019-12-30 DIAGNOSIS — R7989 Other specified abnormal findings of blood chemistry: Secondary | ICD-10-CM | POA: Diagnosis not present

## 2019-12-30 DIAGNOSIS — Z955 Presence of coronary angioplasty implant and graft: Secondary | ICD-10-CM | POA: Diagnosis not present

## 2019-12-30 DIAGNOSIS — Z8249 Family history of ischemic heart disease and other diseases of the circulatory system: Secondary | ICD-10-CM | POA: Diagnosis not present

## 2019-12-30 DIAGNOSIS — I639 Cerebral infarction, unspecified: Secondary | ICD-10-CM | POA: Diagnosis not present

## 2019-12-30 DIAGNOSIS — E119 Type 2 diabetes mellitus without complications: Secondary | ICD-10-CM | POA: Diagnosis not present

## 2019-12-30 DIAGNOSIS — I493 Ventricular premature depolarization: Secondary | ICD-10-CM | POA: Diagnosis not present

## 2019-12-30 DIAGNOSIS — Z833 Family history of diabetes mellitus: Secondary | ICD-10-CM | POA: Diagnosis not present

## 2019-12-30 DIAGNOSIS — F17200 Nicotine dependence, unspecified, uncomplicated: Secondary | ICD-10-CM | POA: Diagnosis not present

## 2019-12-30 DIAGNOSIS — E876 Hypokalemia: Secondary | ICD-10-CM | POA: Diagnosis not present

## 2019-12-30 DIAGNOSIS — R0602 Shortness of breath: Secondary | ICD-10-CM | POA: Diagnosis not present

## 2019-12-30 DIAGNOSIS — Z8261 Family history of arthritis: Secondary | ICD-10-CM | POA: Diagnosis not present

## 2019-12-30 DIAGNOSIS — Z823 Family history of stroke: Secondary | ICD-10-CM | POA: Diagnosis not present

## 2019-12-30 DIAGNOSIS — I10 Essential (primary) hypertension: Secondary | ICD-10-CM | POA: Diagnosis not present

## 2019-12-30 DIAGNOSIS — R0600 Dyspnea, unspecified: Secondary | ICD-10-CM | POA: Diagnosis not present

## 2019-12-30 DIAGNOSIS — R778 Other specified abnormalities of plasma proteins: Secondary | ICD-10-CM | POA: Diagnosis not present

## 2020-01-12 ENCOUNTER — Encounter: Payer: Self-pay | Admitting: Family Medicine

## 2020-01-12 ENCOUNTER — Other Ambulatory Visit: Payer: Self-pay

## 2020-01-12 ENCOUNTER — Ambulatory Visit (INDEPENDENT_AMBULATORY_CARE_PROVIDER_SITE_OTHER): Payer: Medicare Other | Admitting: Family Medicine

## 2020-01-12 VITALS — BP 136/94 | HR 78 | Temp 97.3°F | Resp 16 | Ht 72.0 in | Wt 217.6 lb

## 2020-01-12 DIAGNOSIS — F1911 Other psychoactive substance abuse, in remission: Secondary | ICD-10-CM

## 2020-01-12 DIAGNOSIS — E1169 Type 2 diabetes mellitus with other specified complication: Secondary | ICD-10-CM | POA: Diagnosis not present

## 2020-01-12 DIAGNOSIS — F10282 Alcohol dependence with alcohol-induced sleep disorder: Secondary | ICD-10-CM | POA: Diagnosis not present

## 2020-01-12 DIAGNOSIS — F1021 Alcohol dependence, in remission: Secondary | ICD-10-CM | POA: Insufficient documentation

## 2020-01-12 DIAGNOSIS — G4709 Other insomnia: Secondary | ICD-10-CM

## 2020-01-12 DIAGNOSIS — F1020 Alcohol dependence, uncomplicated: Secondary | ICD-10-CM | POA: Insufficient documentation

## 2020-01-12 DIAGNOSIS — E1159 Type 2 diabetes mellitus with other circulatory complications: Secondary | ICD-10-CM | POA: Diagnosis not present

## 2020-01-12 DIAGNOSIS — E785 Hyperlipidemia, unspecified: Secondary | ICD-10-CM

## 2020-01-12 DIAGNOSIS — I10 Essential (primary) hypertension: Secondary | ICD-10-CM

## 2020-01-12 LAB — POCT GLYCOSYLATED HEMOGLOBIN (HGB A1C): Hemoglobin A1C: 6.9 % — AB (ref 4.0–5.6)

## 2020-01-12 MED ORDER — ONETOUCH ULTRASOFT LANCETS MISC: 5 refills | Status: DC

## 2020-01-12 MED ORDER — LISINOPRIL-HYDROCHLOROTHIAZIDE 20-12.5 MG PO TABS: 1.0000 | ORAL_TABLET | Freq: Every day | ORAL | 5 refills | Status: DC

## 2020-01-12 MED ORDER — ROSUVASTATIN CALCIUM 10 MG PO TABS: ORAL_TABLET | ORAL | 5 refills | Status: DC

## 2020-01-12 MED ORDER — QUETIAPINE FUMARATE 100 MG PO TABS: 100.0000 mg | ORAL_TABLET | Freq: Every day | ORAL | 2 refills | Status: DC

## 2020-01-12 MED ORDER — ONETOUCH VERIO W/DEVICE KIT: PACK | 0 refills | Status: DC

## 2020-01-12 MED ORDER — ONETOUCH VERIO VI STRP: ORAL_STRIP | 5 refills | Status: DC

## 2020-01-12 MED ORDER — METFORMIN HCL 1000 MG PO TABS: 1000.0000 mg | ORAL_TABLET | Freq: Two times a day (BID) | ORAL | 5 refills | Status: DC

## 2020-01-12 NOTE — Patient Instructions (Addendum)
Thank you for coming to the office today.  Recent Labs    06/03/19 1123 01/12/20 1517  HGBA1C 6.0* 6.9*   A1c is elevated today to 6.9. Goal to keep on metformin. Limit high sugar or carb in diet. Alcohol does contain sugar as well, goal to limit and cut back alcohol.  I have ordered OneTouch Verio test device, try to keep track of sugar readings.  --------------------------------------------   Seroquel increased from 100 up to 150mg . Take one and half pill as needed atnight for sleep. If you can do well with 1 whole pill that is fine, may take extra half if needed some nights.  Will need psychiatry for helping quitting alcohol and help with sleep  I will submit a referral to this location. They have psychiatry and counseling services and can help with the insomnia and medications, as well as help with alcohol issue as well.  They should call you with an appointment once the referral is processed.  [address], [place] [address] Phone: [phone]  ----------------------------------------------------  Pick one of these Eye Doctors and call and schedule.  Have them send [place] a copy of the report - please give them our business card with fax # or our info so they can send the diabetic eye report to [place].  [facility] Address: [address] [place], [place] [address] Phone: [phone]  [facility] in: [facility] Address: [address], [place], [place] [address] Phone: [phone]  --------------------------------  If you need Psychiatry near our location here in [place] or [place] [facility]) [facility] [address], [place], [place] [address] Phone: [phone]  [employer], available walk-in 9am-4pm M-F [address], [place] [address] Hours: [place] (M-F, walk in available) [phone]   Address: [place], [place], [place] [address] Hours: 8AM-5PM (accepts walk in to establish) Phone: [phone]    Sleep Hygiene Recommendations to promote healthy sleep in all patients, especially if symptoms of insomnia are worsening. Due to the nature of sleep rhythms, if your body gets "out of rhythm", it may take some time before your sleep cycle can be "reset".  Please try to follow as many of the following tips as you can, usually there are only a few of these are the primary cause of the problem.  ?To reset your sleep rhythm, go to bed and get up at the same time every day ?Sleep only long enough to feel rested and then get out of bed ?Do not try to force yourself to sleep. If you can't sleep, get out of bed and try again later. ?Avoid naps during the day, unless excessively tired. The more sleeping during the day, then the less sleep your body needs at night.  ?Have coffee, tea, and other foods that have caffeine only in the morning ?Exercise several days a week, but not right before bed ?If you drink alcohol, prefer to have appropriate drink with one meal, but prefer to avoid alcohol in the evening, and bedtime ?If you smoke, avoid smoking, especially in the evening  ?Avoid watching TV or looking at phones, computers, or reading devices ("e-books") that give off light at least 30 minutes before bed. This artificial light sends "awake signals" to your brain and can make it harder to fall asleep. ?Make your bedroom a comfortable place where it is easy to fall asleep: ?  Put up shades or special blackout curtains to block light from outside. ? Use a white noise machine to block noise. ? Keep the temperature cool. ?Try your best to solve or at least address your problems before you go to bed ?Use relaxation techniques to manage stress. Ask your health care provider to suggest some techniques that may work well for you. These may include: ? Breathing  exercises. ? Routines to release muscle tension. ? Visualizing peaceful scenes.    Please schedule a Follow-up Appointment to: Return in about 3 months (around 04/13/2020) for 3 month DM A1c, Sleep.  If you have any other questions or concerns, please feel free to call the office or send a message through [place]. You may also schedule an earlier appointment if necessary.  Additionally, you may be receiving a survey about your experience at our office within a few days to 1 week by e-mail or mail. We value your feedback.  Nobie Putnam, DO [place]

## 2020-01-12 NOTE — Progress Notes (Signed)
Subjective:    Patient ID: Tyler Martinez, male    DOB: May 19, 1971, 49 y.o.   MRN: [id number]  Tyler Martinez is a 49 y.o. male presenting on 01/12/2020 for Hospitalization Follow-up (alcohol)   HPI    ED FOLLOW-UP VISIT  Hospital/Location: [facility] Date of ED Visit: Unsure exact date, do not have record  Reason for Presenting to ED: Chest Pain  FOLLOW-UP  Unable to locate ED record - Patient presents today about 2 weeks after recent ED visit. Brief summary of recent course, patient had symptoms of chest pain, presented to ED, testing in ED with labs including chemistry troponin and EKG, treated with IV fluid.  History of heart attack in 2008 He was advised mild elevated troponin but not from his heart Treated with Vitamin B1, Folic Acid, and given rx Lorazepam. He was told some component of alcohol playing a role  Insomnia, and history of substance abuse Alcohol Dependence He reports chronic insomnia, able to fall asleep often will drink some alcohol in evening up to pint liquor most evening, and he takes Seroquel which has helped manage his sleep. However he does wake up often in middle of night and has hard time falling back asleep. In past he has tried other sleep aid such as [place] nd other meds. - He is not ready to quit alcohol but plans to reduce in future - Former history of other substance use has history on opiates, amphetamines with ADHD medication  Type 2 Diabetes, hyperglycemia He had report of CBG >200 at hospital Home readings needs new glucometer, request order today He takes metformin 1000 BID not always taking med  Depression screen [facility] 2/9 10/12/2019 04/14/2019  Decreased Interest 0 2  Down, Depressed, Hopeless 0 0  PHQ - 2 Score 0 2  Altered sleeping - 3  Tired, decreased energy - 1  Change in appetite - 1  Feeling bad or failure about yourself  - 0  Trouble concentrating - 2  Moving slowly or fidgety/restless - 1  Suicidal thoughts - 0  PHQ-9  Score - 10  Difficult doing work/chores - Somewhat difficult    Social History   Tobacco Use  . Smoking status: Current Every Day Smoker    Packs/day: 1.00    Types: Cigarettes  . Smokeless tobacco: Never Used  Substance Use Topics  . Alcohol use: Yes    Alcohol/week: 2.0 standard drinks    Types: 2 Standard drinks or equivalent per week  . Drug use: Not Currently    Review of Systems Per HPI unless specifically indicated above     Objective:    BP (!) 136/94   Pulse 78   Temp (!) 97.3 F (36.3 C) (Temporal)   Resp 16   Ht 6' (1.829 m)   Wt 217 lb 9.6 oz (98.7 kg)   SpO2 99%   BMI 29.51 kg/m   Wt Readings from Last 3 Encounters:  01/12/20 217 lb 9.6 oz (98.7 kg)  10/12/19 225 lb (102.1 kg)  06/03/19 224 lb 12.8 oz (102 kg)    Physical Exam Vitals and nursing note reviewed.  Constitutional:      General: He is not in acute distress.    Appearance: He is well-developed. He is not diaphoretic.     Comments: Well-appearing, comfortable, cooperative  HENT:     Head: Normocephalic and atraumatic.  Eyes:     General:        Right eye: No discharge.  Left eye: No discharge.     Conjunctiva/sclera: Conjunctivae normal.  Neck:     Thyroid: No thyromegaly.  Cardiovascular:     Rate and Rhythm: Normal rate and regular rhythm.     Heart sounds: Normal heart sounds. No murmur.  Pulmonary:     Effort: Pulmonary effort is normal. No respiratory distress.     Breath sounds: Normal breath sounds. No wheezing or rales.  Musculoskeletal:        General: Normal range of motion.     Cervical back: Normal range of motion and neck supple.  Lymphadenopathy:     Cervical: No cervical adenopathy.  Skin:    General: Skin is warm and dry.     Findings: No erythema or rash.  Neurological:     Mental Status: He is alert and oriented to person, place, and time.  Psychiatric:        Behavior: Behavior normal.     Comments: Well groomed, good eye contact, normal speech and  thoughts      Recent Labs    06/03/19 1123 01/12/20 1517  HGBA1C 6.0* 6.9*     Results for orders placed or performed in visit on 01/12/20  POCT glycosylated hemoglobin (Hb A1C)  Result Value Ref Range   Hemoglobin A1C 6.9 (A) 4.0 - 5.6 %      Assessment & Plan:   Problem List Items Addressed This Visit    Type 2 diabetes mellitus with vascular disease ([place]) - Primary   Relevant Medications   Blood Glucose Monitoring Suppl (ONETOUCH VERIO) w/Device KIT   ONETOUCH VERIO test strip   Lancets (ONETOUCH ULTRASOFT) lancets   rosuvastatin (CRESTOR) 10 MG tablet   metFORMIN (GLUCOPHAGE) 1000 MG tablet   lisinopril-hydrochlorothiazide (ZESTORETIC) 20-12.5 MG tablet   Other Relevant Orders   POCT glycosylated hemoglobin (Hb A1C) (Completed)   Hyperlipidemia associated with type 2 diabetes mellitus (HCC)   Relevant Medications   rosuvastatin (CRESTOR) 10 MG tablet   metFORMIN (GLUCOPHAGE) 1000 MG tablet   lisinopril-hydrochlorothiazide (ZESTORETIC) 20-12.5 MG tablet   History of substance abuse ([place])   Relevant Orders   Ambulatory referral to Psychiatry   Essential hypertension   Relevant Medications   rosuvastatin (CRESTOR) 10 MG tablet   lisinopril-hydrochlorothiazide (ZESTORETIC) 20-12.5 MG tablet   Alcohol dependence ([place])   Relevant Orders   Ambulatory referral to Psychiatry    Other Visit Diagnoses    Other insomnia       Relevant Medications   QUEtiapine (SEROQUEL) 100 MG tablet   Other Relevant Orders   Ambulatory referral to Psychiatry      #Alcohol Dependence Chest pain - resolved Unable to retrieve hospital record from ED visit in [facility] system / [facility] He does not have record Clinically he has improved overall History suggests more related to alcohol episode, given treatment was B1 and Folic Acid, advised he may continue these therapy  Counseling on weaning down alcohol gradual, caution stop cold [place] can risk severe withdrawals, he should be  following guidance of treatment plan. However given his history he would benefit from Psychiatry / Substance therapy - will place referral.  Insomnia Will adjust his Seroquel from 100 to up to '150mg'$  dose at night for sleep, can take 100 and consider if need extra half pill for 150 nightly for sleep. Counseling extensively today that alcohol is the primary problem causing sleep disorder for him. Ultimately improve sleep if he can reduce and quit drinking. No other sleep aid offered today, high risk  for BDZ - discussed this today, in past was on Lorazepam or Clonazepam. Also was on Ambien in past - advised against these meds today.  Referral to [facility] for Psychiatry/Substance near [place]  #DM A1c up to 6.9 Ordered DM Testing supplies OneTouch verio Advised local Optometry eye doctor offices to him - handout given to be mailed, he can schedule, for DM eye exam.  Orders Placed This Encounter  Procedures  . Ambulatory referral to Psychiatry    Referral Priority:   Routine    Referral Type:   Psychiatric    Referral Reason:   Specialty Services Required    Requested Specialty:   Psychiatry    Number of Visits Requested:   1  . POCT glycosylated hemoglobin (Hb A1C)     Meds ordered this encounter  Medications  . Blood Glucose Monitoring Suppl (ONETOUCH VERIO) w/Device KIT    Sig: Use to check blood sugar up to twice a day.    Dispense:  1 kit    Refill:  0    E11.59 OneTouch Verio  . ONETOUCH VERIO test strip    Sig: Use to check blood sugar up to twice a day.    Dispense:  200 each    Refill:  5    For use with One Touch Verio Meter. E11.59  . Lancets (ONETOUCH ULTRASOFT) lancets    Sig: Use to check blood sugar up to twice a day.    Dispense:  200 each    Refill:  5    E11.59 OneTouch Verio  . QUEtiapine (SEROQUEL) 100 MG tablet    Sig: Take 1-1.5 tablets (100-150 mg total) by mouth daily.    Dispense:  45 tablet    Refill:  2     Follow up  plan: Return in about 3 months (around 04/13/2020) for 3 month DM A1c, Sleep.   Nobie Putnam, [facility] 01/12/2020, 2:26 PM

## 2020-02-04 ENCOUNTER — Other Ambulatory Visit: Payer: Self-pay | Admitting: Family Medicine

## 2020-02-04 DIAGNOSIS — G4709 Other insomnia: Secondary | ICD-10-CM

## 2020-02-04 NOTE — Telephone Encounter (Signed)
Patient/Pharmacy requesting changes to original Rx- non delegated Rx- sent for PCP review

## 2020-02-22 ENCOUNTER — Other Ambulatory Visit: Payer: Self-pay | Admitting: Family Medicine

## 2020-02-22 DIAGNOSIS — G894 Chronic pain syndrome: Secondary | ICD-10-CM

## 2020-02-28 DIAGNOSIS — R0602 Shortness of breath: Secondary | ICD-10-CM | POA: Diagnosis not present

## 2020-02-28 DIAGNOSIS — M069 Rheumatoid arthritis, unspecified: Secondary | ICD-10-CM | POA: Diagnosis not present

## 2020-02-28 DIAGNOSIS — R1032 Left lower quadrant pain: Secondary | ICD-10-CM | POA: Diagnosis not present

## 2020-02-28 DIAGNOSIS — Z87820 Personal history of traumatic brain injury: Secondary | ICD-10-CM | POA: Diagnosis not present

## 2020-02-28 DIAGNOSIS — S6992XA Unspecified injury of left wrist, hand and finger(s), initial encounter: Secondary | ICD-10-CM | POA: Diagnosis not present

## 2020-02-28 DIAGNOSIS — R451 Restlessness and agitation: Secondary | ICD-10-CM | POA: Diagnosis not present

## 2020-02-28 DIAGNOSIS — E278 Other specified disorders of adrenal gland: Secondary | ICD-10-CM | POA: Diagnosis not present

## 2020-02-28 DIAGNOSIS — Z7984 Long term (current) use of oral hypoglycemic drugs: Secondary | ICD-10-CM | POA: Diagnosis not present

## 2020-02-28 DIAGNOSIS — E860 Dehydration: Secondary | ICD-10-CM | POA: Diagnosis not present

## 2020-02-28 DIAGNOSIS — E119 Type 2 diabetes mellitus without complications: Secondary | ICD-10-CM | POA: Diagnosis not present

## 2020-02-28 DIAGNOSIS — I2510 Atherosclerotic heart disease of native coronary artery without angina pectoris: Secondary | ICD-10-CM | POA: Diagnosis not present

## 2020-02-28 DIAGNOSIS — I10 Essential (primary) hypertension: Secondary | ICD-10-CM | POA: Diagnosis not present

## 2020-02-28 DIAGNOSIS — Z79899 Other long term (current) drug therapy: Secondary | ICD-10-CM | POA: Diagnosis not present

## 2020-02-28 DIAGNOSIS — F10139 Alcohol abuse with withdrawal, unspecified: Secondary | ICD-10-CM | POA: Diagnosis not present

## 2020-02-28 DIAGNOSIS — K4090 Unilateral inguinal hernia, without obstruction or gangrene, not specified as recurrent: Secondary | ICD-10-CM | POA: Diagnosis not present

## 2020-02-28 DIAGNOSIS — T6591XA Toxic effect of unspecified substance, accidental (unintentional), initial encounter: Secondary | ICD-10-CM | POA: Diagnosis not present

## 2020-02-28 DIAGNOSIS — Z955 Presence of coronary angioplasty implant and graft: Secondary | ICD-10-CM | POA: Diagnosis not present

## 2020-02-28 DIAGNOSIS — T510X1A Toxic effect of ethanol, accidental (unintentional), initial encounter: Secondary | ICD-10-CM | POA: Diagnosis not present

## 2020-02-28 DIAGNOSIS — E785 Hyperlipidemia, unspecified: Secondary | ICD-10-CM | POA: Diagnosis not present

## 2020-02-29 DIAGNOSIS — Z720 Tobacco use: Secondary | ICD-10-CM | POA: Diagnosis not present

## 2020-02-29 DIAGNOSIS — Z743 Need for continuous supervision: Secondary | ICD-10-CM | POA: Diagnosis not present

## 2020-02-29 DIAGNOSIS — R451 Restlessness and agitation: Secondary | ICD-10-CM | POA: Diagnosis not present

## 2020-03-03 ENCOUNTER — Other Ambulatory Visit: Payer: Self-pay | Admitting: Family Medicine

## 2020-03-03 DIAGNOSIS — I2510 Atherosclerotic heart disease of native coronary artery without angina pectoris: Secondary | ICD-10-CM

## 2020-03-03 NOTE — Telephone Encounter (Signed)
Requested medication (s) are due for refill today:yes  Requested medication (s) are on the active medication list: yes  Last refill: 11/20/19  #90  1 refill  Future visit scheduled: No  Notes to clinic: Labs are out of date    Requested Prescriptions  Pending Prescriptions Disp Refills   clopidogrel (PLAVIX) 75 MG tablet [Pharmacy Med Name: CLOPIDOGREL 75 MG TABLET] 90 tablet 1    Sig: TAKE 1 TABLET BY [place]      Hematology: Antiplatelets - clopidogrel Failed - 03/03/2020  1:52 PM      Failed - Evaluate AST, ALT within 2 months of therapy initiation.      Failed - ALT in normal range and within 360 days    ALT  Date Value Ref Range Status  09/11/2018 24 0 - 44 U/L Final          Failed - AST in normal range and within 360 days    AST  Date Value Ref Range Status  09/11/2018 42 (H) 15 - 41 U/L Final          Failed - HCT in normal range and within 180 days    HCT  Date Value Ref Range Status  09/11/2018 39.8 39 - 52 % Final          Failed - HGB in normal range and within 180 days    Hemoglobin  Date Value Ref Range Status  09/11/2018 13.4 13.0 - 17.0 g/dL Final          Failed - PLT in normal range and within 180 days    Platelets  Date Value Ref Range Status  09/11/2018 296 150 - 400 K/uL Final          Passed - Valid encounter within last 6 months    Recent Outpatient Visits           1 month ago Type 2 diabetes mellitus with vascular disease ([place])   [facility], Devonne Doughty, DO   4 months ago Erectile dysfunction, unspecified erectile dysfunction type   [place], DO   9 months ago Rheumatoid arthritis involving multiple sites, unspecified whether rheumatoid factor present [facility])   [place], DO   10 months ago Type 2 diabetes mellitus with vascular disease [facility])   [facility] [place], Devonne Doughty, DO

## 2020-03-16 ENCOUNTER — Other Ambulatory Visit: Payer: Self-pay | Admitting: Family Medicine

## 2020-03-16 DIAGNOSIS — I10 Essential (primary) hypertension: Secondary | ICD-10-CM

## 2020-03-16 DIAGNOSIS — I2510 Atherosclerotic heart disease of native coronary artery without angina pectoris: Secondary | ICD-10-CM

## 2020-03-16 NOTE — Telephone Encounter (Signed)
Requested medication (s) are due for refill today: Yes  Requested medication (s) are on the active medication list: Yes  Last refill:  10/12/19  Future visit scheduled: No  Notes to clinic:  Diagnosis code needed, routing to provider     Requested Prescriptions  Pending Prescriptions Disp Refills   carvedilol (COREG) 12.5 MG tablet [Pharmacy Med Name: CARVEDILOL 12.5 MG TABLET] 180 tablet 1    Sig: Take 1 tablet (12.5 mg total) by mouth 2 (two) times daily with a meal.      Cardiovascular:  Beta Blockers Failed - 03/16/2020  5:14 PM      Failed - Last BP in normal range    BP Readings from Last 1 Encounters:  01/12/20 (!) 136/94          Passed - Last Heart Rate in normal range    Pulse Readings from Last 1 Encounters:  01/12/20 78          Passed - Valid encounter within last 6 months    Recent Outpatient Visits           2 months ago Type 2 diabetes mellitus with vascular disease ([place])   [facility], Devonne Doughty, DO   5 months ago Erectile dysfunction, unspecified erectile dysfunction type   [place], DO   9 months ago Rheumatoid arthritis involving multiple sites, unspecified whether rheumatoid factor present [facility])   [facility] Olin Hauser, DO   11 months ago Type 2 diabetes mellitus with vascular disease [facility])   [facility] [place], Devonne Doughty, DO

## 2020-04-02 ENCOUNTER — Other Ambulatory Visit: Payer: Self-pay | Admitting: Family Medicine

## 2020-04-02 DIAGNOSIS — E1142 Type 2 diabetes mellitus with diabetic polyneuropathy: Secondary | ICD-10-CM

## 2020-04-02 NOTE — Telephone Encounter (Signed)
Requested Prescriptions  Pending Prescriptions Disp Refills   gabapentin (NEURONTIN) 800 MG tablet [Pharmacy Med Name: GABAPENTIN 800 MG TABLET] 360 tablet 1    Sig: TAKE 1 TABLET (800 MG TOTAL) BY MOUTH 4 (FOUR) TIMES DAILY.     Neurology: Anticonvulsants - gabapentin Passed - 04/02/2020  2:54 PM      Passed - Valid encounter within last 12 months    Recent Outpatient Visits          2 months ago Type 2 diabetes mellitus with vascular disease ([place])   [place], DO   5 months ago Erectile dysfunction, unspecified erectile dysfunction type   [place], DO   10 months ago Rheumatoid arthritis involving multiple sites, unspecified whether rheumatoid factor present [facility])   [place], DO   11 months ago Type 2 diabetes mellitus with vascular disease [facility])   [facility] [place], Devonne Doughty, DO

## 2020-04-04 DIAGNOSIS — R0602 Shortness of breath: Secondary | ICD-10-CM | POA: Diagnosis not present

## 2020-04-04 DIAGNOSIS — R0789 Other chest pain: Secondary | ICD-10-CM | POA: Diagnosis not present

## 2020-04-04 DIAGNOSIS — R42 Dizziness and giddiness: Secondary | ICD-10-CM | POA: Diagnosis not present

## 2020-04-04 DIAGNOSIS — N179 Acute kidney failure, unspecified: Secondary | ICD-10-CM | POA: Diagnosis not present

## 2020-04-04 DIAGNOSIS — R079 Chest pain, unspecified: Secondary | ICD-10-CM | POA: Diagnosis not present

## 2020-04-04 DIAGNOSIS — I959 Hypotension, unspecified: Secondary | ICD-10-CM | POA: Diagnosis not present

## 2020-04-04 DIAGNOSIS — Z7902 Long term (current) use of antithrombotics/antiplatelets: Secondary | ICD-10-CM | POA: Diagnosis not present

## 2020-04-04 DIAGNOSIS — E860 Dehydration: Secondary | ICD-10-CM | POA: Diagnosis not present

## 2020-04-04 DIAGNOSIS — Z87891 Personal history of nicotine dependence: Secondary | ICD-10-CM | POA: Diagnosis not present

## 2020-04-14 ENCOUNTER — Other Ambulatory Visit: Payer: Self-pay | Admitting: Family Medicine

## 2020-04-14 DIAGNOSIS — G4709 Other insomnia: Secondary | ICD-10-CM

## 2020-04-19 DIAGNOSIS — E860 Dehydration: Secondary | ICD-10-CM | POA: Diagnosis not present

## 2020-04-19 DIAGNOSIS — F17210 Nicotine dependence, cigarettes, uncomplicated: Secondary | ICD-10-CM | POA: Diagnosis not present

## 2020-04-19 DIAGNOSIS — T426X5A Adverse effect of other antiepileptic and sedative-hypnotic drugs, initial encounter: Secondary | ICD-10-CM | POA: Diagnosis not present

## 2020-04-19 DIAGNOSIS — R109 Unspecified abdominal pain: Secondary | ICD-10-CM | POA: Diagnosis not present

## 2020-04-19 DIAGNOSIS — I10 Essential (primary) hypertension: Secondary | ICD-10-CM | POA: Diagnosis not present

## 2020-04-19 DIAGNOSIS — N179 Acute kidney failure, unspecified: Secondary | ICD-10-CM | POA: Diagnosis not present

## 2020-04-19 DIAGNOSIS — I959 Hypotension, unspecified: Secondary | ICD-10-CM | POA: Diagnosis not present

## 2020-04-19 DIAGNOSIS — G8929 Other chronic pain: Secondary | ICD-10-CM | POA: Diagnosis not present

## 2020-04-19 DIAGNOSIS — R079 Chest pain, unspecified: Secondary | ICD-10-CM | POA: Diagnosis not present

## 2020-04-19 DIAGNOSIS — R1013 Epigastric pain: Secondary | ICD-10-CM | POA: Diagnosis not present

## 2020-04-19 DIAGNOSIS — M069 Rheumatoid arthritis, unspecified: Secondary | ICD-10-CM | POA: Diagnosis not present

## 2020-04-19 DIAGNOSIS — G4700 Insomnia, unspecified: Secondary | ICD-10-CM | POA: Diagnosis not present

## 2020-04-19 DIAGNOSIS — Z7902 Long term (current) use of antithrombotics/antiplatelets: Secondary | ICD-10-CM | POA: Diagnosis not present

## 2020-04-19 DIAGNOSIS — Z7984 Long term (current) use of oral hypoglycemic drugs: Secondary | ICD-10-CM | POA: Diagnosis not present

## 2020-04-19 DIAGNOSIS — I7300 Raynaud's syndrome without gangrene: Secondary | ICD-10-CM | POA: Diagnosis not present

## 2020-04-19 DIAGNOSIS — Z8673 Personal history of transient ischemic attack (TIA), and cerebral infarction without residual deficits: Secondary | ICD-10-CM | POA: Diagnosis not present

## 2020-04-19 DIAGNOSIS — M549 Dorsalgia, unspecified: Secondary | ICD-10-CM | POA: Diagnosis not present

## 2020-04-19 DIAGNOSIS — E119 Type 2 diabetes mellitus without complications: Secondary | ICD-10-CM | POA: Diagnosis not present

## 2020-04-19 DIAGNOSIS — I2510 Atherosclerotic heart disease of native coronary artery without angina pectoris: Secondary | ICD-10-CM | POA: Diagnosis not present

## 2020-05-19 ENCOUNTER — Other Ambulatory Visit: Payer: Self-pay | Admitting: Family Medicine

## 2020-05-19 DIAGNOSIS — G4709 Other insomnia: Secondary | ICD-10-CM

## 2020-05-24 ENCOUNTER — Other Ambulatory Visit: Payer: Self-pay | Admitting: Family Medicine

## 2020-05-24 DIAGNOSIS — G894 Chronic pain syndrome: Secondary | ICD-10-CM

## 2020-05-25 NOTE — Telephone Encounter (Signed)
Requested medications are due for refill today?  Yes - This medication refill cannot be delegated.    Requested medications are on active medication list? Yes  Last Refill:  02/22/2020  # 120 with two refills   Future visit scheduled?  No   Notes to Clinic:  This medication refill cannot be delegated.

## 2020-07-23 ENCOUNTER — Other Ambulatory Visit: Payer: Self-pay | Admitting: Family Medicine

## 2020-07-23 DIAGNOSIS — G4709 Other insomnia: Secondary | ICD-10-CM

## 2020-07-23 NOTE — Telephone Encounter (Signed)
Requested medication (s) are due for refill today: yes  Requested medication (s) are on the active medication list: yes  Last refill:  02/04/20  Future visit scheduled: with Nurse Health Advisor  Notes to clinic:  Med not delegated to NT to RF  Pt stated that he will see PCP at upcoming  appt(Nurse Health Advisor) Advised pt that he needs to see PCP- Pt did not make appt   Requested Prescriptions  Pending Prescriptions Disp Refills   QUEtiapine (SEROQUEL) 100 MG tablet [Pharmacy Med Name: QUETIAPINE FUMARATE 100 MG TAB] 135 tablet 1    Sig: TAKE 1-1.5 TABLETS (100-150 MG TOTAL) BY MOUTH DAILY.      Not Delegated - Psychiatry:  Antipsychotics - Second Generation (Atypical) - quetiapine Failed - 07/23/2020  5:10 PM      Failed - This refill cannot be delegated      Failed - ALT in normal range and within 180 days    ALT  Date Value Ref Range Status  09/11/2018 24 0 - 44 U/L Final          Failed - AST in normal range and within 180 days    AST  Date Value Ref Range Status  09/11/2018 42 (H) 15 - 41 U/L Final          Failed - Last BP in normal range    BP Readings from Last 1 Encounters:  01/12/20 (!) 136/94          Failed - Valid encounter within last 6 months    Recent Outpatient Visits           6 months ago Type 2 diabetes mellitus with vascular disease ([place])   [place], DO   9 months ago Erectile dysfunction, unspecified erectile dysfunction type   [place], DO   1 year ago Rheumatoid arthritis involving multiple sites, unspecified whether rheumatoid factor present [facility]   [facility] Olin Hauser, DO   1 year ago Type 2 diabetes mellitus with vascular disease [facility])   [facility] [place], Devonne Doughty, DO

## 2020-07-25 ENCOUNTER — Other Ambulatory Visit: Payer: Self-pay | Admitting: Family Medicine

## 2020-07-25 DIAGNOSIS — I10 Essential (primary) hypertension: Secondary | ICD-10-CM

## 2020-07-25 NOTE — Telephone Encounter (Signed)
Patient called to advise of the need for an appointment, no answer, voicemail full. Noted last OV to return in 3 months, no return visit.

## 2020-08-05 ENCOUNTER — Other Ambulatory Visit: Payer: Self-pay | Admitting: Family Medicine

## 2020-08-05 DIAGNOSIS — G4709 Other insomnia: Secondary | ICD-10-CM

## 2020-08-05 NOTE — Telephone Encounter (Signed)
Requested medication (s) are due for refill today: yes  Requested medication (s) are on the active medication list:yes  Last refill: 02/04/20  #135  1 refill  Future visit scheduled:No  Notes to clinic:  Attempted to contact patient. I was unable to leave message because mailbox is full.  This was refused week ago  with note stating patient need appointment. No Courtesy refill given because of prior refusal    Requested Prescriptions  Pending Prescriptions Disp Refills   QUEtiapine (SEROQUEL) 100 MG tablet [Pharmacy Med Name: QUETIAPINE FUMARATE 100 MG TAB] 135 tablet 1    Sig: TAKE 1-1.5 TABLETS (100-150 MG TOTAL) BY MOUTH DAILY.      Not Delegated - Psychiatry:  Antipsychotics - Second Generation (Atypical) - quetiapine Failed - 08/05/2020  4:31 PM      Failed - This refill cannot be delegated      Failed - ALT in normal range and within 180 days    ALT  Date Value Ref Range Status  09/11/2018 24 0 - 44 U/L Final          Failed - AST in normal range and within 180 days    AST  Date Value Ref Range Status  09/11/2018 42 (H) 15 - 41 U/L Final          Failed - Last BP in normal range    BP Readings from Last 1 Encounters:  01/12/20 (!) 136/94          Failed - Valid encounter within last 6 months    Recent Outpatient Visits           6 months ago Type 2 diabetes mellitus with vascular disease ([place])   [place], DO   9 months ago Erectile dysfunction, unspecified erectile dysfunction type   [place], DO   1 year ago Rheumatoid arthritis involving multiple sites, unspecified whether rheumatoid factor present [facility])   [facility] Olin Hauser, DO   1 year ago Type 2 diabetes mellitus with vascular disease [facility])   [facility] [place], Devonne Doughty, DO

## 2020-08-09 ENCOUNTER — Ambulatory Visit (INDEPENDENT_AMBULATORY_CARE_PROVIDER_SITE_OTHER): Payer: Medicare Other

## 2020-08-09 VITALS — Ht 72.0 in | Wt 215.0 lb

## 2020-08-09 DIAGNOSIS — Z0000 Encounter for general adult medical examination without abnormal findings: Secondary | ICD-10-CM

## 2020-08-09 NOTE — Patient Instructions (Signed)
Tyler Martinez , Thank you for taking time to come for your Medicare Wellness Visit. I appreciate your ongoing commitment to your health goals. Please review the following plan we discussed and let me know if I can assist you in the future.   Screening recommendations/referrals: Colonoscopy: not required Recommended yearly ophthalmology/optometry visit for glaucoma screening and checkup Recommended yearly dental visit for hygiene and checkup  Vaccinations: Influenza vaccine: due Pneumococcal vaccine: completed 08/02/2016 Tdap vaccine: states had Shingles vaccine: n/a   Covid-19:  decline  Advanced directives: Advance directive discussed with you today.    Conditions/risks identified: smoking  Next appointment: Follow up in one year for your annual wellness visit   Preventive Care 40-64 Years, Male Preventive care refers to lifestyle choices and visits with your health care provider that can promote health and wellness. What does preventive care include?  A yearly physical exam. This is also called an annual well check.  Dental exams once or twice a year.  Routine eye exams. Ask your health care provider how often you should have your eyes checked.  Personal lifestyle choices, including:  Daily care of your teeth and gums.  Regular physical activity.  Eating a healthy diet.  Avoiding tobacco and drug use.  Limiting alcohol use.  Practicing safe sex.  Taking low-dose aspirin every day starting at age 49. What happens during an annual well check? The services and screenings done by your health care provider during your annual well check will depend on your age, overall health, lifestyle risk factors, and family history of disease. Counseling  Your health care provider may ask you questions about your:  Alcohol use.  Tobacco use.  Drug use.  Emotional well-being.  Home and relationship well-being.  Sexual activity.  Eating habits.  Work and work  Statistician. Screening  You may have the following tests or measurements:  Height, weight, and BMI.  Blood pressure.  Lipid and cholesterol levels. These may be checked every 5 years, or more frequently if you are over 81 years old.  Skin check.  Lung cancer screening. You may have this screening every year starting at age 3 if you have a 30-pack-year history of smoking and currently smoke or have quit within the past 15 years.  Fecal occult blood test (FOBT) of the stool. You may have this test every year starting at age 49.  Flexible sigmoidoscopy or colonoscopy. You may have a sigmoidoscopy every 5 years or a colonoscopy every 10 years starting at age 72.  Prostate cancer screening. Recommendations will vary depending on your family history and other risks.  Hepatitis C blood test.  Hepatitis B blood test.  Sexually transmitted disease (STD) testing.  Diabetes screening. This is done by checking your blood sugar (glucose) after you have not eaten for a while (fasting). You may have this done every 1-3 years. Discuss your test results, treatment options, and if necessary, the need for more tests with your health care provider. Vaccines  Your health care provider may recommend certain vaccines, such as:  Influenza vaccine. This is recommended every year.  Tetanus, diphtheria, and acellular pertussis (Tdap, Td) vaccine. You may need a Td booster every 10 years.  Zoster vaccine. You may need this after age 13.  Pneumococcal 13-valent conjugate (PCV13) vaccine. You may need this if you have certain conditions and have not been vaccinated.  Pneumococcal polysaccharide (PPSV23) vaccine. You may need one or two doses if you smoke cigarettes or if you have certain conditions. Talk to  your health care provider about which screenings and vaccines you need and how often you need them. This information is not intended to replace advice given to you by your health care provider. Make  sure you discuss any questions you have with your health care provider. Document Released: 09/09/2015 Document Revised: 05/02/2016 Document Reviewed: 06/14/2015 Elsevier Interactive Patient Education  2017 [place] Prevention in the Home Falls can cause injuries. They can happen to people of all ages. There are many things you can do to make your home safe and to help prevent falls. What can I do on the outside of my home?  Regularly fix the edges of walkways and driveways and fix any cracks.  Remove anything that might make you trip as you walk through a door, such as a raised step or threshold.  Trim any bushes or trees on the path to your home.  Use bright outdoor lighting.  Clear any walking paths of anything that might make someone trip, such as rocks or tools.  Regularly check to see if handrails are loose or broken. Make sure that both sides of any steps have handrails.  Any raised decks and porches should have guardrails on the edges.  Have any leaves, snow, or ice cleared regularly.  Use sand or salt on walking paths during winter.  Clean up any spills in your garage right away. This includes oil or grease spills. What can I do in the bathroom?  Use night lights.  Install grab bars by the toilet and in the tub and shower. Do not use towel bars as grab bars.  Use non-skid mats or decals in the tub or shower.  If you need to sit down in the shower, use a plastic, non-slip stool.  Keep the floor dry. Clean up any water that spills on the floor as soon as it happens.  Remove soap buildup in the tub or shower regularly.  Attach bath mats securely with double-sided non-slip rug tape.  Do not have throw rugs and other things on the floor that can make you trip. What can I do in the bedroom?  Use night lights.  Make sure that you have a light by your bed that is easy to reach.  Do not use any sheets or blankets that are too big for your bed. They should  not hang down onto the floor.  Have a firm chair that has side arms. You can use this for support while you get dressed.  Do not have throw rugs and other things on the floor that can make you trip. What can I do in the kitchen?  Clean up any spills right away.  Avoid walking on wet floors.  Keep items that you use a lot in easy-to-reach places.  If you need to reach something above you, use a strong step stool that has a grab bar.  Keep electrical cords out of the way.  Do not use floor polish or wax that makes floors slippery. If you must use wax, use non-skid floor wax.  Do not have throw rugs and other things on the floor that can make you trip. What can I do with my stairs?  Do not leave any items on the stairs.  Make sure that there are handrails on both sides of the stairs and use them. Fix handrails that are broken or loose. Make sure that handrails are as long as the stairways.  Check any carpeting to make sure that it  is firmly attached to the stairs. Fix any carpet that is loose or worn.  Avoid having throw rugs at the top or bottom of the stairs. If you do have throw rugs, attach them to the floor with carpet tape.  Make sure that you have a light switch at the top of the stairs and the bottom of the stairs. If you do not have them, ask someone to add them for you. What else can I do to help prevent falls?  Wear shoes that:  Do not have high heels.  Have rubber bottoms.  Are comfortable and fit you well.  Are closed at the toe. Do not wear sandals.  If you use a stepladder:  Make sure that it is fully opened. Do not climb a closed stepladder.  Make sure that both sides of the stepladder are locked into place.  Ask someone to hold it for you, if possible.  Clearly mark and make sure that you can see:  Any grab bars or handrails.  First and last steps.  Where the edge of each step is.  Use tools that help you move around (mobility aids) if they are  needed. These include:  Canes.  Walkers.  Scooters.  Crutches.  Turn on the lights when you go into a dark area. Replace any light bulbs as soon as they burn out.  Set up your furniture so you have a clear path. Avoid moving your furniture around.  If any of your floors are uneven, fix them.  If there are any pets around you, be aware of where they are.  Review your medicines with your doctor. Some medicines can make you feel dizzy. This can increase your chance of falling. Ask your doctor what other things that you can do to help prevent falls. This information is not intended to replace advice given to you by your health care provider. Make sure you discuss any questions you have with your health care provider. Document Released: 06/09/2009 Document Revised: 01/19/2016 Document Reviewed: 09/17/2014 Elsevier Interactive Patient Education  2017 [employer].

## 2020-08-09 NOTE — Progress Notes (Signed)
I connected with Tyler Martinez today by telephone and verified that I am speaking with the correct person using two identifiers. Location patient: home Location provider: work Persons participating in the virtual visit: Tyler Martinez, Tyler Durand LPN.   I discussed the limitations, risks, security and privacy concerns of performing an evaluation and management service by telephone and the availability of in person appointments. I also discussed with the patient that there may be a patient responsible charge related to this service. The patient expressed understanding and verbally consented to this telephonic visit.    Interactive audio and video telecommunications were attempted between this provider and patient, however failed, due to patient having technical difficulties OR patient did not have access to video capability.  We continued and completed visit with audio only.     Vital signs may be patient reported or missing.  Subjective:   Tyler Martinez is a 49 y.o. male who presents for an Initial Medicare Annual Wellness Visit.  Review of Systems     Cardiac Risk Factors include: diabetes mellitus;dyslipidemia;hypertension;male gender;sedentary lifestyle;smoking/ tobacco exposure     Objective:    Today's Vitals   08/09/20 1413  Weight: 215 lb (97.5 kg)  Height: 6' (1.829 m)   Body mass index is 29.16 kg/m.  Advanced Directives 08/09/2020 09/11/2018  Does Patient Have a Medical Advance Directive? No No  Would patient like information on creating a medical advance directive? - No - Patient declined  Some encounter information is confidential and restricted. Go to Review Flowsheets activity to see all data.    Current Medications (verified) Outpatient Encounter Medications as of 08/09/2020  Medication Sig  . Blood Glucose Monitoring Suppl (ONETOUCH VERIO) w/Device KIT Use to check blood sugar up to twice a day.  . butalbital-acetaminophen-caffeine (FIORICET) 50-325-40 MG  tablet TAKE 1-2 TABLETS BY MOUTH EVERY 6 (SIX) HOURS AS NEEDED FOR HEADACHE.  . carvedilol (COREG) 12.5 MG tablet TAKE 1 TABLET (12.5 MG TOTAL) BY MOUTH 2 (TWO) TIMES DAILY WITH A MEAL.  [name] clopidogrel (PLAVIX) 75 MG tablet TAKE 1 TABLET BY MOUTH EVERY DAY  . gabapentin (NEURONTIN) 800 MG tablet TAKE 1 TABLET (800 MG TOTAL) BY MOUTH 4 (FOUR) TIMES DAILY.  [name] Lancets (ONETOUCH ULTRASOFT) lancets Use to check blood sugar up to twice a day.  . lisinopril-hydrochlorothiazide (ZESTORETIC) 20-12.5 MG tablet Take 1 tablet by mouth daily. OFFICE VISIT NEEDED FOR ADDITIONAL REFILLS  . metFORMIN (GLUCOPHAGE) 1000 MG tablet Take 1 tablet (1,000 mg total) by mouth 2 (two) times daily with a meal.  . ONETOUCH VERIO test strip Use to check blood sugar up to twice a day.  [name] QUEtiapine (SEROQUEL) 100 MG tablet TAKE 1-1.5 TABLETS (100-150 MG TOTAL) BY MOUTH DAILY.  . rosuvastatin (CRESTOR) 10 MG tablet TAKE 1 TABLET BY MOUTH EVERYDAY AT BEDTIME  . sildenafil (REVATIO) 20 MG tablet Take 1-5 pills about 30 min prior to sex. Start with 1 and increase as needed. (Patient not taking: Reported on 08/09/2020)   No facility-administered encounter medications on file as of 08/09/2020.    Allergies (verified) Leflunomide, Methotrexate derivatives, and Other   History: Past Medical History:  Diagnosis Date  . Anxiety   . Arthritis   . Cardiac arrest ([place]) 2008  . Heart attack ([place])   . Hyperlipidemia   . Hypertension   . MI (myocardial infarction) [facility])    Past Surgical History:  Procedure Laterality Date  . CAROTID STENT  2008  . EXPLORATION POST OPERATIVE OPEN HEART  2008  .  HERNIA REPAIR  2008   inguinal   History reviewed. No pertinent family history. Social History   Socioeconomic History  . Marital status: Divorced    Spouse name: Not on file  . Number of children: Not on file  . Years of education: Not on file  . Highest education level: Not on file  Occupational History  . Occupation:  disability  Tobacco Use  . Smoking status: Current Every Day Smoker    Packs/day: 1.00    Types: Cigarettes  . Smokeless tobacco: Never Used  Substance and Sexual Activity  . Alcohol use: Yes    Alcohol/week: 2.0 standard drinks    Types: 2 Standard drinks or equivalent per week  . Drug use: Not Currently  . Sexual activity: Not on file  Other Topics Concern  . Not on file  Social History Narrative  . Not on file   Social Determinants of Health   Financial Resource Strain: Low Risk   . Difficulty of Paying Living Expenses: Not hard at all  Food Insecurity: No Food Insecurity  . Worried About Charity fundraiser in the Last Year: Never true  . Ran Out of Food in the Last Year: Never true  Transportation Needs: No Transportation Needs  . Lack of Transportation (Medical): No  . Lack of Transportation (Non-Medical): No  Physical Activity: Inactive  . Days of Exercise per Week: 0 days  . Minutes of Exercise per Session: 0 min  Stress: No Stress Concern Present  . Feeling of Stress : Not at all  Social Connections: Not on file    Tobacco Counseling Ready to quit: Yes Counseling given: Not Answered   Clinical Intake:  Pre-visit preparation completed: Yes  Pain : No/denies pain     Nutritional Status: BMI 25 -29 Overweight Nutritional Risks: None Diabetes: Yes  How often do you need to have someone help you when you read instructions, pamphlets, or other written materials from your doctor or pharmacy?: 1 - Never What is the last grade level you completed in school?: 10th grade  Diabetic? Yes Nutrition Risk Assessment:  Has the patient had any N/V/D within the last 2 months?  No  Does the patient have any non-healing wounds?  No  Has the patient had any unintentional weight loss or weight gain?  No   Diabetes:  Is the patient diabetic?  Yes  If diabetic, was a CBG obtained today?  No  Did the patient bring in their glucometer from home?  No  How often do you  monitor your CBG's? none.   Financial Strains and Diabetes Management:  Are you having any financial strains with the device, your supplies or your medication? No .  Does the patient want to be seen by Chronic Care Management for management of their diabetes?  No  Would the patient like to be referred to a Nutritionist or for Diabetic Management?  No   Diabetic Exams:  Diabetic Eye Exam: Overdue for diabetic eye exam. Pt has been advised about the importance in completing this exam. Patient advised to call and schedule an eye exam. Diabetic Foot Exam: Overdue, Pt has been advised about the importance in completing this exam. Pt is scheduled for diabetic foot exam on next appointment.   Interpreter Needed?: No  Information entered by :: NAllen LPN   Activities of Daily Living In your present state of health, do you have any difficulty performing the following activities: 08/09/2020 10/12/2019  Hearing? Y N  Comment if  he does not pay attention -  Vision? Y N  Comment peripheral vision impaired -  Difficulty concentrating or making decisions? Y N  Comment short term memory loss -  Walking or climbing stairs? Y N  Comment hurts -  Dressing or bathing? N N  Doing errands, shopping? N N  Preparing Food and eating ? N -  Using the Toilet? N -  In the past six months, have you accidently leaked urine? N -  Do you have problems with loss of bowel control? N -  Managing your Medications? N -  Managing your Finances? N -  Housekeeping or managing your Housekeeping? N -  Some recent data might be hidden    Patient Care Team: Olin Hauser, DO as PCP - General (Family Medicine)  Indicate any recent Medical Services you may have received from other than [facility] providers in the past year (date may be approximate).     Assessment:   This is a routine wellness examination for Tyler Martinez.  Hearing/Vision screen No exam data present  Dietary issues and exercise activities  discussed: Current Exercise Habits: The patient does not participate in regular exercise at present  Goals    . Patient Stated     08/09/2020, not to die too soon      Depression Screen PHQ 2/9 Scores 08/09/2020 10/12/2019 04/14/2019  PHQ - 2 Score 4 0 2  PHQ- 9 Score 13 - 10    Fall Risk Fall Risk  08/09/2020 10/12/2019 04/14/2019  Falls in the past year? 0 0 0  Number falls in past yr: - 0 -  Injury with Fall? - 0 -  Risk for fall due to : Medication side effect;Impaired balance/gait - -  Follow up Falls evaluation completed;Education provided;Falls prevention discussed Falls evaluation completed Falls evaluation completed    FALL RISK PREVENTION PERTAINING TO THE HOME:  Any stairs in or around the home? Yes  If so, are there any without handrails? No  Home free of loose throw rugs in walkways, pet beds, electrical cords, etc? Yes  Adequate lighting in your home to reduce risk of falls? Yes   ASSISTIVE DEVICES UTILIZED TO PREVENT FALLS:  Life alert? No  Use of a cane, walker or w/c? No  Grab bars in the bathroom? No  Shower chair or bench in shower? Yes  Elevated toilet seat or a handicapped toilet? No   TIMED UP AND GO:  Was the test performed? No .   Cognitive Function:     6CIT Screen 08/09/2020  What Year? 0 points  What month? 0 points  What time? 0 points  Count back from 20 0 points  Months in reverse 4 points  Repeat phrase 2 points  Total Score 6    Immunizations Immunization History  Administered Date(s) Administered  . Influenza, Seasonal, Injecte, Preservative Fre 06/21/2015  . Influenza,inj,Quad PF,6+ Mos 07/29/2018, 06/03/2019  . Influenza,inj,quad, With Preservative 08/02/2016  . Influenza-Unspecified 05/30/2017  . Pneumococcal Polysaccharide-23 08/02/2016    TDAP status: Due, Education has been provided regarding the importance of this vaccine. Advised may receive this vaccine at local pharmacy or Health Dept. Aware to provide a copy of  the vaccination record if obtained from local pharmacy or Health Dept. Verbalized acceptance and understanding.  Flu Vaccine status: Due, Education has been provided regarding the importance of this vaccine. Advised may receive this vaccine at local pharmacy or Health Dept. Aware to provide a copy of the vaccination record if obtained from  local pharmacy or Health Dept. Verbalized acceptance and understanding.  Pneumococcal vaccine status: Up to date  Covid-19 vaccine status: Declined, Education has been provided regarding the importance of this vaccine but patient still declined. Advised may receive this vaccine at local pharmacy or Health Dept.or vaccine clinic. Aware to provide a copy of the vaccination record if obtained from local pharmacy or Health Dept. Verbalized acceptance and understanding.  Qualifies for Shingles Vaccine? No   Zostavax completed n/a  Shingrix Completed?: n/a  Screening Tests Health Maintenance  Topic Date Due  . Hepatitis C Screening  Never done  . COVID-19 Vaccine (1) Never done  . OPHTHALMOLOGY EXAM  Never done  . HIV Screening  Never done  . TETANUS/TDAP  Never done  . INFLUENZA VACCINE  03/27/2020  . FOOT EXAM  04/13/2020  . HEMOGLOBIN A1C  07/14/2020  . PNEUMOCOCCAL POLYSACCHARIDE VACCINE AGE 60-64 HIGH RISK  Completed    Health Maintenance  Health Maintenance Due  Topic Date Due  . Hepatitis C Screening  Never done  . COVID-19 Vaccine (1) Never done  . OPHTHALMOLOGY EXAM  Never done  . HIV Screening  Never done  . TETANUS/TDAP  Never done  . INFLUENZA VACCINE  03/27/2020  . FOOT EXAM  04/13/2020  . HEMOGLOBIN A1C  07/14/2020    Colorectal cancer screening: No longer required.   Lung Cancer Screening: (Low Dose CT Chest recommended if Age 49-80 years, 30 pack-year currently smoking OR have quit w/in 15years.) does not qualify.   Lung Cancer Screening Referral: no  Additional Screening:  Hepatitis C Screening: does qualify; due  Vision  Screening: Recommended annual ophthalmology exams for early detection of glaucoma and other disorders of the eye. Is the patient up to date with their annual eye exam?  No  Who is the provider or what is the name of the office in which the patient attends annual eye exams? none If pt is not established with a provider, would they like to be referred to a provider to establish care? No .   Dental Screening: Recommended annual dental exams for proper oral hygiene  Community Resource Referral / Chronic Care Management: CRR required this visit?  No   CCM required this visit?  No      Plan:     I have personally reviewed and noted the following in the patient's chart:   . Medical and social history . Use of alcohol, tobacco or illicit drugs  . Current medications and supplements . Functional ability and status . Nutritional status . Physical activity . Advanced directives . List of other physicians . Hospitalizations, surgeries, and ER visits in previous 12 months . Vitals . Screenings to include cognitive, depression, and falls . Referrals and appointments  In addition, I have reviewed and discussed with patient certain preventive protocols, quality metrics, and best practice recommendations. A written personalized care plan for preventive services as well as general preventive health recommendations were provided to patient.     Kellie Simmering, LPN   [id number]   Nurse Notes:

## 2020-08-10 ENCOUNTER — Other Ambulatory Visit: Payer: Self-pay | Admitting: Family Medicine

## 2020-08-10 DIAGNOSIS — E1159 Type 2 diabetes mellitus with other circulatory complications: Secondary | ICD-10-CM

## 2020-08-10 NOTE — Telephone Encounter (Signed)
Requested Prescriptions  Pending Prescriptions Disp Refills   metFORMIN (GLUCOPHAGE) 1000 MG tablet [Pharmacy Med Name: METFORMIN HCL 1,000 MG TABLET] 180 tablet 0    Sig: TAKE 1 TABLET (1,000 MG TOTAL) BY MOUTH 2 (TWO) TIMES DAILY WITH A MEAL.     Endocrinology:  Diabetes - Biguanides Failed - 08/10/2020 12:58 AM      Failed - Cr in normal range and within 360 days    Creat  Date Value Ref Range Status  06/03/2019 0.89 0.60 - 1.35 mg/dL Final         Failed - HBA1C is between 0 and 7.9 and within 180 days    Hemoglobin A1C  Date Value Ref Range Status  01/12/2020 6.9 (A) 4.0 - 5.6 % Final   Hgb A1c MFr Bld  Date Value Ref Range Status  06/03/2019 6.0 (H) <5.7 % of total Hgb Final    Comment:    For someone without known diabetes, a hemoglobin  A1c value between 5.7% and 6.4% is consistent with prediabetes and should be confirmed with a  follow-up test. . For someone with known diabetes, a value <7% indicates that their diabetes is well controlled. A1c targets should be individualized based on duration of diabetes, age, comorbid conditions, and other considerations. . This assay result is consistent with an increased risk of diabetes. . Currently, no consensus exists regarding use of hemoglobin A1c for diagnosis of diabetes for children. .          Failed - eGFR in normal range and within 360 days    GFR, Est African American  Date Value Ref Range Status  06/03/2019 118 > OR = 60 mL/min/1.23m Final   GFR, Est Non African American  Date Value Ref Range Status  06/03/2019 102 > OR = 60 mL/min/1.734mFinal         Failed - Valid encounter within last 6 months    Recent Outpatient Visits          7 months ago Type 2 diabetes mellitus with vascular disease ([facility]  [place]   10 months ago Erectile dysfunction, unspecified erectile dysfunction type   [place]   1 year  ago [facility] involving multiple sites, [facility]   1 year ago Type 2 diabetes mellitus with vascular disease ([facility]  [facility]      Future Appointments            In 5 days [facility] [facility]  [facility] [place]

## 2020-08-10 NOTE — Telephone Encounter (Addendum)
Phone call to pt. Re: need for scheduling a follow-up appt. With Dr. Raliegh Ip., to be able to continue to get medication refills.  Pt. Stated "I drove an hour and a half yesterday for an appt., and did not get to see Dr. Raliegh Ip."  Stated "I need my medication to help me sleep."  Appt. Scheduled for 08/15/20 @ 4:00 PM.  Advised will send request through, again, for review on refilling his Seroquel.  Agreed with plan.

## 2020-08-15 ENCOUNTER — Ambulatory Visit: Payer: Self-pay | Admitting: Family Medicine

## 2020-09-04 ENCOUNTER — Other Ambulatory Visit: Payer: Self-pay | Admitting: Family Medicine

## 2020-09-04 DIAGNOSIS — I10 Essential (primary) hypertension: Secondary | ICD-10-CM

## 2020-09-16 ENCOUNTER — Other Ambulatory Visit: Payer: Self-pay | Admitting: Family Medicine

## 2020-09-16 DIAGNOSIS — I10 Essential (primary) hypertension: Secondary | ICD-10-CM

## 2020-09-16 NOTE — Telephone Encounter (Signed)
   Notes to clinic Already had curtesy refill, please assess.

## 2020-09-22 ENCOUNTER — Other Ambulatory Visit: Payer: Self-pay | Admitting: Family Medicine

## 2020-09-22 DIAGNOSIS — G894 Chronic pain syndrome: Secondary | ICD-10-CM

## 2020-09-22 NOTE — Telephone Encounter (Signed)
Requested medication (s) are due for refill today: yes  Requested medication (s) are on the active medication list: yes  Last refill:  05/26/20 #120 with 2 refills  Future visit scheduled: yes  Notes to clinic:  Please review for refill. Refill not delegated per protocol    Requested Prescriptions  Pending Prescriptions Disp Refills   butalbital-acetaminophen-caffeine (FIORICET) 50-325-40 MG tablet [Pharmacy Med Name: BUTALB-ACETAMIN-CAFF 50-325-40] 120 tablet 2    Sig: TAKE 1-2 TABLETS BY MOUTH EVERY 6 (SIX) HOURS AS NEEDED FOR HEADACHE.      Not Delegated - Analgesics:  Non-Opioid Analgesic Combinations Failed - 09/22/2020  2:53 AM      Failed - This refill cannot be delegated      Passed - Valid encounter within last 12 months    Recent Outpatient Visits           8 months ago Type 2 diabetes mellitus with vascular disease [facility])   [facility], Devonne Doughty, DO   11 months ago Erectile dysfunction, unspecified erectile dysfunction type   [place], DO   1 year ago Rheumatoid arthritis involving multiple sites, unspecified whether rheumatoid factor present [facility])   [facility] Olin Hauser, DO   1 year ago Type 2 diabetes mellitus with vascular disease ([place])   [facility] Olin Hauser, DO       Future Appointments             In 10 months [facility], [facility]

## 2020-10-20 ENCOUNTER — Other Ambulatory Visit: Payer: Self-pay | Admitting: Family Medicine

## 2020-10-20 DIAGNOSIS — E1159 Type 2 diabetes mellitus with other circulatory complications: Secondary | ICD-10-CM

## 2020-10-20 DIAGNOSIS — E785 Hyperlipidemia, unspecified: Secondary | ICD-10-CM

## 2020-10-20 DIAGNOSIS — I10 Essential (primary) hypertension: Secondary | ICD-10-CM

## 2020-10-20 DIAGNOSIS — I2510 Atherosclerotic heart disease of native coronary artery without angina pectoris: Secondary | ICD-10-CM

## 2020-10-20 DIAGNOSIS — E1169 Type 2 diabetes mellitus with other specified complication: Secondary | ICD-10-CM

## 2020-10-20 DIAGNOSIS — E1142 Type 2 diabetes mellitus with diabetic polyneuropathy: Secondary | ICD-10-CM

## 2020-10-20 NOTE — Telephone Encounter (Signed)
Requested medications are due for refill today yes  Requested medications are on the active medication list yes  Last refill 12/24  Last visit 12/2019  Future visit scheduled 07/2021  Notes to clinic Already given curtesy refill for Zestoretic, OV note states return in 3 months (Aug) did not, upcoming appt not until Dec. Also lipid labs are from 08/2018. Please assess.

## 2020-12-29 ENCOUNTER — Other Ambulatory Visit: Payer: Self-pay | Admitting: Family Medicine

## 2020-12-29 DIAGNOSIS — E1159 Type 2 diabetes mellitus with other circulatory complications: Secondary | ICD-10-CM

## 2020-12-29 NOTE — Telephone Encounter (Signed)
Requested medications are due for refill today.  yes  Requested medications are on the active medications list.  yes  Last refill. 08/10/2020  Future visit scheduled.   yes  Notes to clinic.  Pt is more than 3 months overdue.

## 2021-01-26 ENCOUNTER — Other Ambulatory Visit: Payer: Self-pay | Admitting: Family Medicine

## 2021-01-26 DIAGNOSIS — E1159 Type 2 diabetes mellitus with other circulatory complications: Secondary | ICD-10-CM

## 2021-01-26 NOTE — Telephone Encounter (Signed)
Courtesy refill. Patient needs office visit for further refills. Requested Prescriptions  Pending Prescriptions Disp Refills  . metFORMIN (GLUCOPHAGE) 1000 MG tablet [Pharmacy Med Name: METFORMIN HCL 1,000 MG TABLET] 60 tablet 0    Sig: TAKE 1 TABLET (1,000 MG TOTAL) BY MOUTH 2 (TWO) TIMES DAILY WITH A MEAL.     Endocrinology:  Diabetes - Biguanides Failed - 01/26/2021  3:45 AM      Failed - Cr in normal range and within 360 days    Creat  Date Value Ref Range Status  06/03/2019 0.89 0.60 - 1.35 mg/dL Final         Failed - HBA1C is between 0 and 7.9 and within 180 days    Hemoglobin A1C  Date Value Ref Range Status  01/12/2020 6.9 (A) 4.0 - 5.6 % Final   Hgb A1c MFr Bld  Date Value Ref Range Status  06/03/2019 6.0 (H) <5.7 % of total Hgb Final    Comment:    For someone without known diabetes, a hemoglobin  A1c value between 5.7% and 6.4% is consistent with prediabetes and should be confirmed with a  follow-up test. . For someone with known diabetes, a value <7% indicates that their diabetes is well controlled. A1c targets should be individualized based on duration of diabetes, age, comorbid conditions, and other considerations. . This assay result is consistent with an increased risk of diabetes. . Currently, no consensus exists regarding use of hemoglobin A1c for diagnosis of diabetes for children. .          Failed - eGFR in normal range and within 360 days    GFR, Est African American  Date Value Ref Range Status  06/03/2019 118 > OR = 60 mL/min/1.73m2 Final   GFR, Est Non African American  Date Value Ref Range Status  06/03/2019 102 > OR = 60 mL/min/1.73m2 Final         Failed - Valid encounter within last 6 months    Recent Outpatient Visits          1 year ago Type 2 diabetes mellitus with vascular disease (HCC)   [facility] [place], Alexander J, DO   1 year ago Erectile dysfunction, unspecified erectile dysfunction type   [facility] Kar[facility]al[facility], Alexander J, DO   1 year ago Rheumatoid arthritis involving multiple sites, unspecified whether rheumatoid factor present (HCC)   [facility] Karamalegos, Alexander J, DO   1 year ago Type 2 diabetes mellitus with vascular disease (HCC)   [facility] Karamalegos, Alexander J, DO      Future Appointments            In 6 months [facility]             

## 2021-02-03 ENCOUNTER — Other Ambulatory Visit: Payer: Self-pay | Admitting: Family Medicine

## 2021-02-03 DIAGNOSIS — E1159 Type 2 diabetes mellitus with other circulatory complications: Secondary | ICD-10-CM

## 2021-02-06 ENCOUNTER — Encounter: Payer: Self-pay | Admitting: Family Medicine

## 2021-02-06 ENCOUNTER — Other Ambulatory Visit: Payer: Self-pay

## 2021-02-06 ENCOUNTER — Ambulatory Visit (INDEPENDENT_AMBULATORY_CARE_PROVIDER_SITE_OTHER): Payer: Medicare Other | Admitting: Family Medicine

## 2021-02-06 VITALS — BP 188/117 | HR 86 | Ht 72.0 in | Wt 205.4 lb

## 2021-02-06 DIAGNOSIS — M25511 Pain in right shoulder: Secondary | ICD-10-CM | POA: Diagnosis not present

## 2021-02-06 DIAGNOSIS — G8929 Other chronic pain: Secondary | ICD-10-CM

## 2021-02-06 DIAGNOSIS — M25311 Other instability, right shoulder: Secondary | ICD-10-CM

## 2021-02-06 DIAGNOSIS — I10 Essential (primary) hypertension: Secondary | ICD-10-CM

## 2021-02-06 DIAGNOSIS — M069 Rheumatoid arthritis, unspecified: Secondary | ICD-10-CM | POA: Diagnosis not present

## 2021-02-06 DIAGNOSIS — I2510 Atherosclerotic heart disease of native coronary artery without angina pectoris: Secondary | ICD-10-CM | POA: Diagnosis not present

## 2021-02-06 DIAGNOSIS — F10282 Alcohol dependence with alcohol-induced sleep disorder: Secondary | ICD-10-CM

## 2021-02-06 DIAGNOSIS — E1159 Type 2 diabetes mellitus with other circulatory complications: Secondary | ICD-10-CM | POA: Diagnosis not present

## 2021-02-06 DIAGNOSIS — E785 Hyperlipidemia, unspecified: Secondary | ICD-10-CM | POA: Diagnosis not present

## 2021-02-06 DIAGNOSIS — E1169 Type 2 diabetes mellitus with other specified complication: Secondary | ICD-10-CM | POA: Diagnosis not present

## 2021-02-06 DIAGNOSIS — E1142 Type 2 diabetes mellitus with diabetic polyneuropathy: Secondary | ICD-10-CM

## 2021-02-06 DIAGNOSIS — M79641 Pain in right hand: Secondary | ICD-10-CM | POA: Diagnosis not present

## 2021-02-06 MED ORDER — ROSUVASTATIN CALCIUM 10 MG PO TABS: 10.0000 mg | ORAL_TABLET | Freq: Every day | ORAL | 5 refills | Status: DC

## 2021-02-06 MED ORDER — LISINOPRIL-HYDROCHLOROTHIAZIDE 10-12.5 MG PO TABS: 1.0000 | ORAL_TABLET | Freq: Every day | ORAL | 5 refills | Status: DC

## 2021-02-06 MED ORDER — GABAPENTIN 800 MG PO TABS: 800.0000 mg | ORAL_TABLET | Freq: Four times a day (QID) | ORAL | 5 refills | Status: DC

## 2021-02-06 MED ORDER — CARVEDILOL 6.25 MG PO TABS: 6.2500 mg | ORAL_TABLET | Freq: Two times a day (BID) | ORAL | 5 refills | Status: DC

## 2021-02-06 MED ORDER — CLOPIDOGREL BISULFATE 75 MG PO TABS: 1.0000 | ORAL_TABLET | Freq: Every day | ORAL | 5 refills | Status: DC

## 2021-02-06 MED ORDER — METFORMIN HCL 1000 MG PO TABS
1000.0000 mg | ORAL_TABLET | Freq: Two times a day (BID) | ORAL | 5 refills | Status: DC
Start: 2021-02-06 — End: 2021-08-14
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
## 2021-02-06 NOTE — Assessment & Plan Note (Signed)
Stable chronic CAD s/p MI stent x 3 on med management now Without CP or anginal symptoms He will return to Cardiology as able On Plavix, ACEi, BB, Statin  Plan Continue Plavix, lisinopril-hctz, carvedilol, rosuvastatin 10

## 2021-02-06 NOTE — Assessment & Plan Note (Signed)
Previously controlled DM A1c 6.9 (2021) - due for A1c with next labs Complications - peripheral neuropathy, vascular CAD CVA, hyperlipidemia, depression, - increases risk of future cardiovascular complications - OFF Sulfonylurea glimepiride   Plan:  1. Continue current therapy - Metformin 1000mg  BID 2. Encourage improved lifestyle - low carb, low sugar diet, reduce portion size, continue improving regular exercise 3. Check CBG , bring log to next visit for review 4. Continue ACEi, Statin - Again Advised to schedule DM ophtho exam, send record

## 2021-02-06 NOTE — Assessment & Plan Note (Signed)
Previously controlled on statin Elevated ASCVD risk with known CAD, history of CVA  Plan: Continue Rosuvastatin 10mg  nightly - for ASCVD risk Encourage improved lifestyle - low carb/cholesterol, reduce portion size, continue improving regular exercise

## 2021-02-06 NOTE — Patient Instructions (Addendum)
Thank you for coming to the office today.  Referral to Ortho - stay tuned for apt.  [facility] Address: [address], [place], [place] [address] Hours:  9AM-5PM Phone: [phone]  DUE for FASTING BLOOD WORK (no food or drink after midnight before the lab appointment, only water or coffee without cream/sugar on the morning of)  SCHEDULE "Lab Only" visit in the morning at the clinic for lab draw in [place]   - Make sure Lab Only appointment is at about 1 week before your next appointment, so that results will be available  For Lab Results, once available within 2-3 days of blood draw, you can can log in to MyChart online to view your results and a brief explanation. Also, we can discuss results at next follow-up visit.   Please schedule a Follow-up Appointment to: No follow-ups on file.  If you have any other questions or concerns, please feel free to call the office or send a message through [place]. You may also schedule an earlier appointment if necessary.  Additionally, you may be receiving a survey about your experience at our office within a few days to 1 week by e-mail or mail. We value your feedback.  Nobie Putnam, DO [place]

## 2021-02-06 NOTE — Assessment & Plan Note (Signed)
Elevated BP, did not take meds out of med - lost to follow-up now returning to care - Home BP readings have been improved Complication with CAD    Plan:  1. Adjust current BP meds reduce dose Carvedilol to 6.25mg  BID and Lisinopril-HCTZ 10-12.5mg  daily - he had issue with too high doses before, now if he adheres to meds may not need as high dose. - adjust accordingly 2. Encourage improved lifestyle - low sodium diet, regular exercise 3. Start monitor BP outside office, bring readings to next visit, if persistently >140/90 or new symptoms notify office sooner

## 2021-03-13 ENCOUNTER — Other Ambulatory Visit: Payer: Medicare Other

## 2021-03-13 DIAGNOSIS — I10 Essential (primary) hypertension: Secondary | ICD-10-CM

## 2021-03-13 DIAGNOSIS — E1169 Type 2 diabetes mellitus with other specified complication: Secondary | ICD-10-CM

## 2021-03-13 DIAGNOSIS — E1159 Type 2 diabetes mellitus with other circulatory complications: Secondary | ICD-10-CM

## 2021-03-20 ENCOUNTER — Ambulatory Visit
Admission: RE | Admit: 2021-03-20 | Discharge: 2021-03-20 | Disposition: A | Discharge status: Home / Self Care | Payer: Medicare Other | Attending: Family Medicine | Admitting: Family Medicine

## 2021-03-20 ENCOUNTER — Ambulatory Visit (INDEPENDENT_AMBULATORY_CARE_PROVIDER_SITE_OTHER): Payer: Medicare Other | Admitting: Family Medicine

## 2021-03-20 ENCOUNTER — Other Ambulatory Visit: Payer: Self-pay

## 2021-03-20 ENCOUNTER — Ambulatory Visit
Admission: RE | Admit: 2021-03-20 | Discharge: 2021-03-20 | Disposition: A | Discharge status: Home / Self Care | Payer: Medicare Other | Source: Ambulatory Visit | Attending: Family Medicine | Admitting: Family Medicine

## 2021-03-20 ENCOUNTER — Encounter: Payer: Self-pay | Admitting: Family Medicine

## 2021-03-20 VITALS — BP 167/106 | HR 75 | Ht 72.0 in | Wt 217.0 lb

## 2021-03-20 DIAGNOSIS — M25312 Other instability, left shoulder: Secondary | ICD-10-CM

## 2021-03-20 DIAGNOSIS — M25311 Other instability, right shoulder: Secondary | ICD-10-CM

## 2021-03-20 DIAGNOSIS — E1159 Type 2 diabetes mellitus with other circulatory complications: Secondary | ICD-10-CM

## 2021-03-20 DIAGNOSIS — I10 Essential (primary) hypertension: Secondary | ICD-10-CM

## 2021-03-20 DIAGNOSIS — G894 Chronic pain syndrome: Secondary | ICD-10-CM

## 2021-03-20 DIAGNOSIS — M25511 Pain in right shoulder: Secondary | ICD-10-CM | POA: Insufficient documentation

## 2021-03-20 DIAGNOSIS — M25512 Pain in left shoulder: Secondary | ICD-10-CM | POA: Insufficient documentation

## 2021-03-20 DIAGNOSIS — G8929 Other chronic pain: Secondary | ICD-10-CM | POA: Insufficient documentation

## 2021-03-20 DIAGNOSIS — E785 Hyperlipidemia, unspecified: Secondary | ICD-10-CM | POA: Diagnosis not present

## 2021-03-20 DIAGNOSIS — E1169 Type 2 diabetes mellitus with other specified complication: Secondary | ICD-10-CM | POA: Diagnosis not present

## 2021-03-20 MED ORDER — TRAMADOL HCL 50 MG PO TABS: 50.0000 mg | ORAL_TABLET | Freq: Three times a day (TID) | ORAL | 2 refills | Status: DC | PRN

## 2021-03-20 MED ORDER — LISINOPRIL-HYDROCHLOROTHIAZIDE 20-12.5 MG PO TABS: 1.0000 | ORAL_TABLET | Freq: Every day | ORAL | 3 refills | Status: DC

## 2021-03-20 NOTE — Patient Instructions (Addendum)
Thank you for coming to the office today.  Injections in shoulders when ready, after X-ray can be 1-2 weeks.  Refills sent on higher dose Lisinopril now 20-12.'5mg'$   Tramadol ordered as needed for pain  Please schedule a Follow-up Appointment to: Return in about 3 months (around 06/20/2021) for 1-2 weeks for Bilateral shoulder injections - then 3 month DM A1c, HTN, Pain med.  If you have any other questions or concerns, please feel free to call the office or send a message through [place]. You may also schedule an earlier appointment if necessary.  Additionally, you may be receiving a survey about your experience at our office within a few days to 1 week by e-mail or mail. We value your feedback.  Nobie Putnam, DO [place]
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
## 2021-03-21 LAB — COMPLETE METABOLIC PANEL WITH GFR
AG Ratio: 2.3 (calc) (ref 1.0–2.5)
ALT: 19 U/L (ref 9–46)
AST: 22 U/L (ref 10–40)
Albumin: 4.2 g/dL (ref 3.6–5.1)
Alkaline phosphatase (APISO): 50 U/L (ref 36–130)
BUN: 23 mg/dL (ref 7–25)
CO2: 27 mmol/L (ref 20–32)
Calcium: 9.1 mg/dL (ref 8.6–10.3)
Chloride: 105 mmol/L (ref 98–110)
Creat: 0.75 mg/dL (ref 0.60–1.29)
Globulin: 1.8 g/dL (calc) — ABNORMAL LOW (ref 1.9–3.7)
Glucose, Bld: 119 mg/dL — ABNORMAL HIGH (ref 65–99)
Potassium: 4 mmol/L (ref 3.5–5.3)
Sodium: 140 mmol/L (ref 135–146)
Total Bilirubin: 0.4 mg/dL (ref 0.2–1.2)
Total Protein: 6 g/dL — ABNORMAL LOW (ref 6.1–8.1)
eGFR: 111 mL/min/{1.73_m2} (ref >=60)

## 2021-03-21 LAB — LIPID PANEL
Cholesterol: 161 mg/dL (ref <200)
HDL: 73 mg/dL (ref >=40)
LDL Cholesterol (Calc): 69 mg/dL (calc)
Non-HDL Cholesterol (Calc): 88 mg/dL (calc) (ref <130)
Total CHOL/HDL Ratio: 2.2 (calc) (ref <5.0)
Triglycerides: 107 mg/dL (ref <150)

## 2021-03-21 LAB — CBC WITH DIFFERENTIAL/PLATELET
Absolute Monocytes: 752 cells/uL (ref 200–950)
Basophils Absolute: 41 cells/uL (ref 0–200)
Basophils Relative: 0.6 %
Eosinophils Absolute: 290 cells/uL (ref 15–500)
Eosinophils Relative: 4.2 %
HCT: 39.1 % (ref 38.5–50.0)
Hemoglobin: 12.7 g/dL — ABNORMAL LOW (ref 13.2–17.1)
Lymphs Abs: 1484 cells/uL (ref 850–3900)
MCH: 33.8 pg — ABNORMAL HIGH (ref 27.0–33.0)
MCHC: 32.5 g/dL (ref 32.0–36.0)
MCV: 104 fL — ABNORMAL HIGH (ref 80.0–100.0)
MPV: 10 fL (ref 7.5–12.5)
Monocytes Relative: 10.9 %
Neutro Abs: 4333 cells/uL (ref 1500–7800)
Neutrophils Relative %: 62.8 %
Platelets: 227 10*3/uL (ref 140–400)
RBC: 3.76 10*6/uL — ABNORMAL LOW (ref 4.20–5.80)
RDW: 12.7 % (ref 11.0–15.0)
Total Lymphocyte: 21.5 %
WBC: 6.9 10*3/uL (ref 3.8–10.8)

## 2021-03-21 LAB — HEMOGLOBIN A1C
Hgb A1c MFr Bld: 6.6 % of total Hgb — ABNORMAL HIGH (ref <5.7)
Mean Plasma Glucose: 143 mg/dL
eAG (mmol/L): 7.9 mmol/L

## 2021-03-31 ENCOUNTER — Ambulatory Visit (INDEPENDENT_AMBULATORY_CARE_PROVIDER_SITE_OTHER): Payer: Medicare Other | Admitting: Pharmacist

## 2021-03-31 DIAGNOSIS — E1159 Type 2 diabetes mellitus with other circulatory complications: Secondary | ICD-10-CM | POA: Diagnosis not present

## 2021-03-31 DIAGNOSIS — I10 Essential (primary) hypertension: Secondary | ICD-10-CM | POA: Diagnosis not present

## 2021-03-31 NOTE — Patient Instructions (Signed)
Visit Information   PATIENT GOALS:   Goals Addressed             This Visit's Progress    Pharmacy Goals       Please follow up with your health plan over the counter benefit about obtaining an upper arm blood pressure monitor  Our goal bad cholesterol, or LDL, is less than 70 . This is why it is important to continue taking your rosuvastatin.   Feel free to call me with any questions or concerns. I look forward to our next call!  Wallace Cullens, PharmD, Para [facility], CPP Clinical Pharmacist [place] [phone]        Consent to CCM Services: Mr. Babe was given information about Chronic Care Management services including:  CCM service includes personalized support from designated clinical staff supervised by his physician, including individualized plan of care and coordination with other care providers 24/7 contact phone numbers for assistance for urgent and routine care needs. Service will only be billed when office clinical staff spend 20 minutes or more in a month to coordinate care. Only one practitioner may furnish and bill the service in a calendar month. The patient may stop CCM services at any time (effective at the end of the month) by phone call to the office staff. The patient will be responsible for cost sharing (co-pay) of up to 20% of the service fee (after annual deductible is met).  Patient agreed to services and verbal consent obtained.   The patient verbalized understanding of instructions, educational materials, and care plan provided today and declined offer to receive copy of patient instructions, educational materials, and care plan.   Telephone follow up appointment with care management team member scheduled for: 04/10/2021 at 9:30 AM  CLINICAL CARE PLAN: Patient Care Plan: PharmD - Medication Management/Adherence     Problem Identified: Disease Progression      Long-Range Goal: Disease Progression Prevented or  Minimized   Start Date: 03/31/2021  Expected End Date: 06/29/2021  This Visit's Progress: On track  Priority: High  Note:   Current Barriers:  Need for evaluation of medication adherence, as identified by patient's health plan, based on medication adherence quality data  Financial barriers  Pharmacist Clinical Goal(s):  Over the next 90 days, patient will achieve adherence to monitoring guidelines and medication adherence to achieve therapeutic efficacy through collaboration with PharmD and provider.    Interventions: 1:1 collaboration with Olin Hauser, DO regarding development and update of comprehensive plan of care as evidenced by provider attestation and co-signature Inter-disciplinary care team collaboration (see longitudinal plan of care) Perform chart review. Patient seen for Office Visit with PCP on 7/25. PCP advised patient to: Increase lisinopril-HCTZ dose to 20-12.5 mg daily Return for follow up visit in 3 months Comprehensive medication review performed; medication list updated in electronic medical record Reports has been out of clopidogrel for a couple of days due to cost, but confirms now able to pick up from pharmacy Encourage patient to pick up today Note clopidogrel is a tier 2 medication per formulary from patient's health plan website and tier 1&2 medications available through mail order for $0  Diabetes/Medication Adherence: Controlled; current treatment: metformin 1000 mg twice daily with meals  Assess adherence to metformin?(as identified by health plan).   Patient admits to often missing morning dose of metformin when does not eat breakfast Review dispensing history in chart. Rx last filled on 7/13 for 90 day supply  Encourage patient to  consider using weekly pillbox as adherence aid Patient declines stating he has a good routine taking medication from pill bottles Counsel on importance of regular well-balanced meals. Encourage patient to take  metformin doses consistently and with food to reduce risk of GI side effects  Hypertension: Uncontrolled; current treatment: Carvedilol 6.25 mg twice daily Lisinopril-HCTZ 20.12.5 mg daily Confirms picked up and taking this new dose from PCP Denies monitoring home BP as currently does not have working monitor Encourage patient to obtain new upper arm BP monitor from health plan over the counter benefit Identify patient has been taking carvedilol only ONCE daily in evening Counsel patient to start taking carvedilol 6.25 mg TWICE daily as directed  Hyperlipidemia: Controlled; current treatment: rosuvastatin 10 mg daily Encourage patient to take consistently  Patient Goals/Self-Care Activities Over the next 90 days, patient will:  - take medications as prescribed target a minimum of 150 minutes of moderate intensity exercise weekly  Follow Up Plan: Telephone follow up appointment with care management team member scheduled for: 04/10/2021 at 9:30 AM

## 2021-03-31 NOTE — Chronic Care Management (AMB) (Signed)
Chronic Care Management Pharmacy Note  03/31/2021 Name:  Tyler Martinez MRN:  [id number] DOB:  09-Sep-1970   Subjective: Tyler Martinez is an 50 y.o. year old male who is a primary patient of Olin Hauser, DO.  The CCM team was consulted for assistance with disease management and care coordination needs.    Engaged with patient by telephone for initial visit in response to provider referral for pharmacy case management and/or care coordination services.   Consent to Services:  The patient was given the following information about Chronic Care Management services today, agreed to services, and gave verbal consent: 1. CCM service includes personalized support from designated clinical staff supervised by the primary care provider, including individualized plan of care and coordination with other care providers 2. 24/7 contact phone numbers for assistance for urgent and routine care needs. 3. Service will only be billed when office clinical staff spend 20 minutes or more in a month to coordinate care. 4. Only one practitioner may furnish and bill the service in a calendar month. 5.The patient may stop CCM services at any time (effective at the end of the month) by phone call to the office staff. 6. The patient will be responsible for cost sharing (co-pay) of up to 20% of the service fee (after annual deductible is met). Patient agreed to services and consent obtained.  Patient Care Team: Olin Hauser, DO as PCP - General (Family Medicine) Curley Spice Virl Diamond, RPH-CPP as Pharmacist (Pharmacist)  Recent office visits: Office Visit with PCP on 7/25   Hospital visits: None in previous 6 months  Objective:  Lab Results  Component Value Date   CREATININE 0.75 03/20/2021   CREATININE 0.89 06/03/2019   CREATININE 0.67 09/11/2018    Lab Results  Component Value Date   HGBA1C 6.6 (H) 03/20/2021   Last diabetic Eye exam: No results found for: HMDIABEYEEXA  Last diabetic  Foot exam: No results found for: HMDIABFOOTEX      Component Value Date/Time   CHOL 161 03/20/2021 1110   TRIG 107 03/20/2021 1110   HDL 73 03/20/2021 1110   CHOLHDL 2.2 03/20/2021 1110   VLDL 22 09/13/2018 0648   LDLCALC 69 03/20/2021 1110    Hepatic Function Latest Ref Rng & Units 03/20/2021 09/11/2018  Total Protein 6.1 - 8.1 g/dL 6.0(L) 6.6  Albumin 3.5 - 5.0 g/dL - 4.3  AST 10 - 40 U/L 22 42(H)  ALT 9 - 46 U/L 19 24  Alk Phosphatase 38 - 126 U/L - 71  Total Bilirubin 0.2 - 1.2 mg/dL 0.4 0.6   Clinical ASCVD: Yes  The ASCVD Risk score Mikey Bussing DC Jr., et al., 2013) failed to calculate for the following reasons:   The patient has a prior MI or stroke diagnosis      Social History   Tobacco Use  Smoking Status Every Day   Packs/day: 1.00   Types: Cigarettes  Smokeless Tobacco Never   BP Readings from Last 3 Encounters:  03/20/21 (!) 167/106  02/06/21 (!) 188/117  01/12/20 (!) 136/94   Pulse Readings from Last 3 Encounters:  03/20/21 75  02/06/21 86  01/12/20 78   Wt Readings from Last 3 Encounters:  03/20/21 217 lb (98.4 kg)  02/06/21 205 lb 6.4 oz (93.2 kg)  08/09/20 215 lb (97.5 kg)    Assessment: Review of patient past medical history, allergies, medications, health status, including review of consultants reports, laboratory and other test data, was performed as part of comprehensive evaluation  and provision of chronic care management services.   SDOH:  (Social Determinants of Health) assessments and interventions performed: none   CCM Care Plan  Allergies  Allergen Reactions   Leflunomide Nausea And Vomiting   Methotrexate Derivatives Nausea And Vomiting    Have RA but can not tolerate any  Of the med  No components found with this name: PROTEIN24HR saw Rheumatology in 2005   Other Nausea And Vomiting    Have RA but can not tolerate any  Of the med  No components found with this name: PROTEIN24HR saw Rheumatology in 2005    Medications Reviewed Today      Reviewed by Rennis Petty, RPH-CPP (Pharmacist) on 03/31/21 at 1037  Med List Status: <None>   Medication Order Taking? Sig Documenting Provider Last Dose Status Informant  Blood Glucose Monitoring Suppl (ONETOUCH VERIO) w/Device KIT [id number]  Use to check blood sugar up to twice a day. Olin Hauser, DO  Active   carvedilol (COREG) 6.25 MG tablet [id number]  Take 1 tablet (6.25 mg total) by mouth 2 (two) times daily with a meal. Parks Ranger, Devonne Doughty, DO  Active   clopidogrel (PLAVIX) 75 MG tablet [id number] Yes Take 1 tablet (75 mg total) by mouth daily. Olin Hauser, DO Taking Active   gabapentin (NEURONTIN) 800 MG tablet [id number] Yes Take 1 tablet (800 mg total) by mouth 4 (four) times daily. Olin Hauser, DO Taking Active   Lancets [facility] ULTRASOFT) lancets [id number]  Use to check blood sugar up to twice a day. Olin Hauser, DO  Active   lisinopril-hydrochlorothiazide (ZESTORETIC) 20-12.5 MG tablet [id number] Yes Take 1 tablet by mouth daily. Olin Hauser, DO Taking Active   metFORMIN (GLUCOPHAGE) 1000 MG tablet [id number] Yes Take 1 tablet (1,000 mg total) by mouth 2 (two) times daily with a meal. Olin Hauser, DO Taking Active   [facility] VERIO test strip [id number]  Use to check blood sugar up to twice a day. Karamalegos, Devonne Doughty, DO  Active   rosuvastatin (CRESTOR) 10 MG tablet [id number] Yes Take 1 tablet (10 mg total) by mouth at bedtime. Olin Hauser, DO Taking Active   traMADol (ULTRAM) 50 MG tablet [id number] Yes Take 1 tablet (50 mg total) by mouth every 8 (eight) hours as needed. 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 04/14/2019   Type 2 diabetes mellitus with vascular disease ([place]) 04/14/2019   Diabetic polyneuropathy associated with type 2 diabetes mellitus ([place]) 04/14/2019   Rheumatoid arthritis involving multiple sites ([place]) 04/14/2019   Flat foot 04/14/2019   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    Immunization History  Administered Date(s) Administered   Influenza, Seasonal, Injecte, Preservative Fre 06/21/2015   Influenza,inj,Quad PF,6+ Mos 07/29/2018, 06/03/2019   Influenza,inj,quad, With Preservative 08/02/2016   Influenza-Unspecified 05/30/2017   Pneumococcal Polysaccharide-23 08/02/2016    Conditions to be addressed/monitored: CAD, HTN, DMII, hx CVA  Care Plan : PharmD - Medication Management/Adherence  Updates made by Rennis Petty,  RPH-CPP since 03/31/2021 12:00 AM     Problem: Disease Progression      Long-Range Goal: Disease Progression Prevented or Minimized   Start Date: 03/31/2021  Expected End Date: 06/29/2021  This Visit's Progress: On track  Priority: High  Note:   Current Barriers:  Need for evaluation of medication adherence, as identified by patient's health plan, based on medication adherence quality data  Financial barriers  Pharmacist Clinical Goal(s):  Over the next 90 days, patient will achieve adherence to monitoring guidelines and medication adherence to achieve therapeutic efficacy through collaboration with PharmD and provider.    Interventions: 1:1 collaboration with Olin Hauser, DO regarding development and update of comprehensive plan of care as evidenced by provider attestation and co-signature Inter-disciplinary care team collaboration (see longitudinal plan of care) Perform chart review. Patient  seen for Office Visit with PCP on 7/25. PCP advised patient to: Increase lisinopril-HCTZ dose to 20-12.5 mg daily Return for follow up visit in 3 months Comprehensive medication review performed; medication list updated in electronic medical record Reports has been out of clopidogrel for a couple of days due to cost, but confirms now able to pick up from pharmacy Encourage patient to pick up today Note clopidogrel is a tier 2 medication per formulary from patient's health plan website and tier 1&2 medications available through mail order for $0  Diabetes/Medication Adherence: Controlled; current treatment: metformin 1000 mg twice daily with meals  Assess adherence to metformin?(as identified by health plan).   Patient admits to often missing morning dose of metformin when does not eat breakfast Review dispensing history in chart. Rx last filled on 7/13 for 90 day supply  Encourage patient to consider using weekly pillbox as adherence aid Patient declines stating he has a good routine taking medication from pill bottles Counsel on importance of regular well-balanced meals. Encourage patient to take metformin doses consistently and with food to reduce risk of GI side effects  Hypertension: Uncontrolled; current treatment: Carvedilol 6.25 mg twice daily Lisinopril-HCTZ 20.12.5 mg daily Confirms picked up and taking this new dose from PCP Denies monitoring home BP as currently does not have working monitor Encourage patient to obtain new upper arm BP monitor from health plan over the counter benefit Identify patient has been taking carvedilol only ONCE daily in evening Counsel patient to start taking carvedilol 6.25 mg TWICE daily as directed  Hyperlipidemia: Controlled; current treatment: rosuvastatin 10 mg daily Encourage patient to take consistently  Patient Goals/Self-Care Activities Over the next 90 days, patient will:  - take medications as prescribed target a minimum of 150  minutes of moderate intensity exercise weekly  Follow Up Plan: Telephone follow up appointment with care management team member scheduled for: 04/10/2021 at 9:30 AM       Patient's preferred pharmacy is:  [id number] [place] [id number]: [phone]: [phone] [id number] [place] [address] [id number]: [phone]: [phone] Follow Up:  Patient agrees to Care Plan and Follow-up.  ElWallace CullensPharmD, BCPara MarchCPP Clinical Pharmacist [phone]

## 2021-04-04 ENCOUNTER — Ambulatory Visit
Admission: RE | Admit: 2021-04-04 | Discharge: 2021-04-04 | Disposition: A | Discharge status: Home / Self Care | Payer: Medicare Other | Source: Ambulatory Visit | Attending: Family Medicine | Admitting: Family Medicine

## 2021-04-04 ENCOUNTER — Ambulatory Visit
Admission: RE | Admit: 2021-04-04 | Discharge: 2021-04-04 | Disposition: A | Discharge status: Home / Self Care | Payer: Medicare Other | Source: Home / Self Care | Attending: Family Medicine | Admitting: Family Medicine

## 2021-04-04 ENCOUNTER — Ambulatory Visit (INDEPENDENT_AMBULATORY_CARE_PROVIDER_SITE_OTHER): Payer: Medicare Other | Admitting: Family Medicine

## 2021-04-04 ENCOUNTER — Other Ambulatory Visit: Payer: Self-pay

## 2021-04-04 ENCOUNTER — Encounter: Payer: Self-pay | Admitting: Family Medicine

## 2021-04-04 VITALS — BP 169/113 | HR 80 | Ht 72.0 in | Wt 215.4 lb

## 2021-04-04 DIAGNOSIS — S39012A Strain of muscle, fascia and tendon of lower back, initial encounter: Secondary | ICD-10-CM

## 2021-04-04 DIAGNOSIS — M5442 Lumbago with sciatica, left side: Secondary | ICD-10-CM

## 2021-04-04 DIAGNOSIS — M545 Low back pain, unspecified: Secondary | ICD-10-CM | POA: Diagnosis not present

## 2021-04-04 MED ORDER — KETOROLAC TROMETHAMINE 60 MG/2ML IM SOLN
60.0000 mg | Freq: Once | INTRAMUSCULAR | Status: AC
  Administered 2021-04-04: 60 mg via INTRAMUSCULAR

## 2021-04-04 NOTE — Progress Notes (Signed)
Subjective:    Patient ID: Tyler Martinez, male    DOB: Nov 11, 1970, 50 y.o.   MRN: [id number]  Tyler Martinez is a 50 y.o. male presenting on 04/04/2021 for Back Pain  Patient presents for a same day appointment.  HPI  Acute on Chronic Low Back Pain Injury Recent injury 2-3 days ago heavy lifting tires, strained his low back, causing sciatica symptoms into L>R leg similar to prior pain. He says in past has benefit from injection has had toradol before with good results. Taking Tramadol PRN limited result on this back pain He has known history chronic lumbar DDD and RA history Denies any trauma or fall or other injury   Depression screen [facility] 2/9 04/04/2021 03/20/2021 02/06/2021  Decreased Interest 1 1 1   Down, Depressed, Hopeless 1 0 0  PHQ - 2 Score 2 1 1   Altered sleeping 1 1 1   Tired, decreased energy 1 1 1   Change in appetite 1 1 0  Feeling bad or failure about yourself  0 0 1  Trouble concentrating 1 1 1   Moving slowly or fidgety/restless 1 1 1   Suicidal thoughts 0 0 0  PHQ-9 Score 7 6 6   Difficult doing work/chores Somewhat difficult Somewhat difficult Somewhat difficult    Social History   Tobacco Use   Smoking status: Every Day    Packs/day: 1.00    Types: Cigarettes   Smokeless tobacco: Never  Substance Use Topics   Alcohol use: Yes    Alcohol/week: 2.0 standard drinks    Types: 2 Standard drinks or equivalent per week   Drug use: Not Currently    Review of Systems Per HPI unless specifically indicated above     Objective:    BP (!) 169/113   Pulse 80   Ht 6' (1.829 m)   Wt 215 lb 6.4 oz (97.7 kg)   SpO2 98%   BMI 29.21 kg/m   Wt Readings from Last 3 Encounters:  04/04/21 215 lb 6.4 oz (97.7 kg)  03/20/21 217 lb (98.4 kg)  02/06/21 205 lb 6.4 oz (93.2 kg)    Physical Exam Vitals and nursing note reviewed.  Constitutional:      General: He is not in acute distress.    Appearance: Normal appearance. He is well-developed. He is not diaphoretic.      Comments: Well-appearing, comfortable, cooperative  HENT:     Head: Normocephalic and atraumatic.  Eyes:     General:        Right eye: No discharge.        Left eye: No discharge.     Conjunctiva/sclera: Conjunctivae normal.  Cardiovascular:     Rate and Tyler: Normal rate.  Pulmonary:     [place]: Pulmonary effort is normal.  Skin:    General: Skin is warm and dry.     Findings: No erythema or rash.  Neurological:     Mental Status: He is alert and oriented to person, place, and time.  Psychiatric:        Mood and Affect: Mood normal.        Behavior: Behavior normal.        Thought Content: Thought content normal.     Comments: Well groomed, good eye contact, normal speech and thoughts   Results for orders placed or performed in visit on 03/13/21  Hemoglobin A1c  Result Value Ref Range   Hgb A1c MFr Bld 6.6 (H) <5.7 % of total Hgb   Mean Plasma Glucose 143 mg/dL  eAG (mmol/L) 7.9 mmol/L  Lipid panel  Result Value Ref Range   Cholesterol 161 <200 mg/dL   HDL 73 > OR = 40 mg/dL   Triglycerides 107 <150 mg/dL   LDL Cholesterol (Calc) 69 mg/dL (calc)   Total CHOL/HDL Ratio 2.2 <5.0 (calc)   Non-HDL Cholesterol (Calc) 88 <130 mg/dL (calc)  CBC with Differential/Platelet  Result Value Ref Range   WBC 6.9 3.8 - 10.8 Thousand/uL   RBC 3.76 (L) 4.20 - 5.80 Million/uL   Hemoglobin 12.7 (L) 13.2 - 17.1 g/dL   HCT 39.1 38.5 - 50.0 %   MCV 104.0 (H) 80.0 - 100.0 fL   MCH 33.8 (H) 27.0 - 33.0 pg   MCHC 32.5 32.0 - 36.0 g/dL   RDW 12.7 11.0 - 15.0 %   Platelets 227 140 - 400 Thousand/uL   MPV 10.0 7.5 - 12.5 fL   Neutro Abs 4,333 1,500 - 7,800 cells/uL   Lymphs Abs 1,484 850 - 3,900 cells/uL   Absolute Monocytes 752 200 - 950 cells/uL   Eosinophils Absolute 290 15 - 500 cells/uL   Basophils Absolute 41 0 - 200 cells/uL   Neutrophils Relative % 62.8 %   Total Lymphocyte 21.5 %   Monocytes Relative 10.9 %   Eosinophils Relative 4.2 %   Basophils Relative 0.6 %  COMPLETE  METABOLIC PANEL WITH GFR  Result Value Ref Range   Glucose, Bld 119 (H) 65 - 99 mg/dL   BUN 23 7 - 25 mg/dL   Creat 0.75 0.60 - 1.29 mg/dL   eGFR 111 > OR = 60 mL/min/1.46m   BUN/Creatinine Ratio NOT APPLICABLE 6 - 22 (calc)   Sodium 140 135 - 146 mmol/L   Potassium 4.0 3.5 - 5.3 mmol/L   Chloride 105 98 - 110 mmol/L   CO2 27 20 - 32 mmol/L   Calcium 9.1 8.6 - 10.3 mg/dL   Total Protein 6.0 (L) 6.1 - 8.1 g/dL   Albumin 4.2 3.6 - 5.1 g/dL   Globulin 1.8 (L) 1.9 - 3.7 g/dL (calc)   AG Ratio 2.3 1.0 - 2.5 (calc)   Total Bilirubin 0.4 0.2 - 1.2 mg/dL   Alkaline phosphatase (APISO) 50 36 - 130 U/L   AST 22 10 - 40 U/L   ALT 19 9 - 46 U/L      Assessment & Plan:   Problem List Items Addressed This Visit   None Visit Diagnoses     Acute bilateral low back pain with left-sided sciatica    -  Primary   Relevant Orders   DG Lumbar Spine Complete   Back strain, initial encounter           Acute on chronic LBP with associated L>R sciatica. Suspect likely due to muscle spasm/strain, heavy lifting Known OA/DJD DDD Lumbar spine  Plan: 1. Start injection today Toradol 688mIM x 1 dose. 2. Continue home medications Tramadol 3 May use Tylenol PRN for breakthrough 4. Encouraged use of heating pad 1-2x daily for now then PRN 5. Follow-up 4-6 weeks if not improved for re-evaluation, trial of PT, and possibly referral to Orthopedic  Back X-ray today will review results and f/u with patient.   Orders Placed This Encounter  Procedures   DG Lumbar Spine Complete    Standing Status:   Future    Standing Expiration Date:   04/04/2022    Order Specific Question:   Reason for Exam (SYMPTOM  OR DIAGNOSIS REQUIRED)    Answer:   lifting  back injury, acute low back pain / strain, with radiating symptoms with sciatica L>R    Order Specific Question:   Preferred imaging location?    Answer:   [facility]      No orders of the defined types were placed in this encounter.     Follow  up plan: Return in about 4 weeks (around 05/02/2021), or if symptoms worsen or fail to improve.   Nobie Putnam, [facility] 04/04/2021, 1:52 PM

## 2021-04-04 NOTE — Patient Instructions (Addendum)
Thank you for coming to the office today.  X-ray today stay tuned for results.  Toradol shot injection if we have it.  Keep on Tramadol.  Keep an eye on BP - try adding the extra half or whole pill of 10-12.'5mg'$  try adding this if you need and check BP follow up if BP doesn't improve once pain improves.  Please schedule a Follow-up Appointment to: Return in about 4 weeks (around 05/02/2021), or if symptoms worsen or fail to improve.  If you have any other questions or concerns, please feel free to call the office or send a message through [place]. You may also schedule an earlier appointment if necessary.  Additionally, you may be receiving a survey about your experience at our office within a few days to 1 week by e-mail or mail. We value your feedback.  Nobie Putnam, DO [place]

## 2021-04-10 ENCOUNTER — Telehealth: Payer: Self-pay | Admitting: Pharmacist

## 2021-04-10 ENCOUNTER — Telehealth: Payer: Self-pay

## 2021-04-10 NOTE — Telephone Encounter (Signed)
  Chronic Care Management   Outreach Note  04/10/2021 Name: Tyler Martinez MRN: [id number] DOB: [birth year]  Referred by: Olin Hauser, DO Reason for referral : No chief complaint on file.   Was unable to reach patient via telephone today and unable to leave a message as voicemail is not setup.   Follow Up Plan: Will collaborate with Care Guide to outreach to schedule follow up with me  Harlow Asa, PharmD, [facility] [phone]

## 2021-04-18 NOTE — Telephone Encounter (Signed)
Patient has been rescheduled.

## 2021-04-28 ENCOUNTER — Telehealth: Payer: Self-pay | Admitting: Pharmacist

## 2021-04-28 ENCOUNTER — Telehealth: Payer: Medicare Other

## 2021-04-28 NOTE — Telephone Encounter (Signed)
  Chronic Care Management   Outreach Note  04/28/2021 Name: Tyler Martinez MRN: [id number] DOB: February 05, 1971  Referred by: Olin Hauser, DO Reason for referral : No chief complaint on file.  9 am: Reached patient by telephone. However, patient states that he is driving and requests call back ~ noon  12pm: Was unable to reach patient via telephone and unable to leave message as voicemail is not setup   Follow Up Plan: CM Pharmacist will attempt to reach patient by telephone again in the next 2 weeks  Harlow Asa, PharmD, [facility] [phone]

## 2021-05-01 ENCOUNTER — Other Ambulatory Visit: Payer: Self-pay

## 2021-05-01 ENCOUNTER — Emergency Department
Admission: EM | Admit: 2021-05-01 | Discharge: 2021-05-01 | Disposition: A | Discharge status: Home / Self Care | Payer: Medicare Other | Attending: Emergency Medicine | Admitting: Emergency Medicine

## 2021-05-01 ENCOUNTER — Emergency Department: Payer: Medicare Other

## 2021-05-01 DIAGNOSIS — Z7902 Long term (current) use of antithrombotics/antiplatelets: Secondary | ICD-10-CM | POA: Insufficient documentation

## 2021-05-01 DIAGNOSIS — I2510 Atherosclerotic heart disease of native coronary artery without angina pectoris: Secondary | ICD-10-CM | POA: Insufficient documentation

## 2021-05-01 DIAGNOSIS — Z79899 Other long term (current) drug therapy: Secondary | ICD-10-CM | POA: Diagnosis not present

## 2021-05-01 DIAGNOSIS — F17210 Nicotine dependence, cigarettes, uncomplicated: Secondary | ICD-10-CM | POA: Insufficient documentation

## 2021-05-01 DIAGNOSIS — Z7984 Long term (current) use of oral hypoglycemic drugs: Secondary | ICD-10-CM | POA: Insufficient documentation

## 2021-05-01 DIAGNOSIS — R079 Chest pain, unspecified: Secondary | ICD-10-CM | POA: Diagnosis not present

## 2021-05-01 DIAGNOSIS — R140 Abdominal distension (gaseous): Secondary | ICD-10-CM | POA: Diagnosis not present

## 2021-05-01 DIAGNOSIS — E1142 Type 2 diabetes mellitus with diabetic polyneuropathy: Secondary | ICD-10-CM | POA: Insufficient documentation

## 2021-05-01 DIAGNOSIS — R0602 Shortness of breath: Secondary | ICD-10-CM | POA: Insufficient documentation

## 2021-05-01 DIAGNOSIS — I10 Essential (primary) hypertension: Secondary | ICD-10-CM | POA: Diagnosis not present

## 2021-05-01 LAB — CBC
HCT: 40.8 % (ref 39.0–52.0)
Hemoglobin: 14.1 g/dL (ref 13.0–17.0)
MCH: 35 pg — ABNORMAL HIGH (ref 26.0–34.0)
MCHC: 34.6 g/dL (ref 30.0–36.0)
MCV: 101.2 fL — ABNORMAL HIGH (ref 80.0–100.0)
Platelets: 273 10*3/uL (ref 150–400)
RBC: 4.03 MIL/uL — ABNORMAL LOW (ref 4.22–5.81)
RDW: 13.2 % (ref 11.5–15.5)
WBC: 6.7 10*3/uL (ref 4.0–10.5)
nRBC: 0 % (ref 0.0–0.2)

## 2021-05-01 LAB — COMPREHENSIVE METABOLIC PANEL
ALT: 17 U/L (ref 0–44)
AST: 21 U/L (ref 15–41)
Albumin: 3.9 g/dL (ref 3.5–5.0)
Alkaline Phosphatase: 54 U/L (ref 38–126)
Anion gap: 7 (ref 5–15)
BUN: 34 mg/dL — ABNORMAL HIGH (ref 6–20)
CO2: 26 mmol/L (ref 22–32)
Calcium: 8.9 mg/dL (ref 8.9–10.3)
Chloride: 103 mmol/L (ref 98–111)
Creatinine, Ser: 0.99 mg/dL (ref 0.61–1.24)
GFR, Estimated: >60 mL/min (ref >60)
Glucose, Bld: 142 mg/dL — ABNORMAL HIGH (ref 70–99)
Potassium: 4.9 mmol/L (ref 3.5–5.1)
Sodium: 136 mmol/L (ref 135–145)
Total Bilirubin: 0.5 mg/dL (ref 0.3–1.2)
Total Protein: 6.2 g/dL — ABNORMAL LOW (ref 6.5–8.1)

## 2021-05-01 LAB — AMMONIA: Ammonia: 21 umol/L (ref 9–35)

## 2021-05-01 LAB — TROPONIN I (HIGH SENSITIVITY)
Troponin I (High Sensitivity): 16 ng/L (ref <18)
Troponin I (High Sensitivity): 26 ng/L — ABNORMAL HIGH (ref <18)
Troponin I (High Sensitivity): 26 ng/L — ABNORMAL HIGH (ref <18)

## 2021-05-01 LAB — BRAIN NATRIURETIC PEPTIDE: B Natriuretic Peptide: 60.5 pg/mL (ref 0.0–100.0)

## 2021-05-01 LAB — PROTIME-INR
INR: 0.9 (ref 0.8–1.2)
Prothrombin Time: 11.9 seconds (ref 11.4–15.2)

## 2021-05-01 LAB — LIPASE, BLOOD: Lipase: 49 U/L (ref 11–51)

## 2021-05-01 MED ORDER — IPRATROPIUM-ALBUTEROL 0.5-2.5 (3) MG/3ML IN SOLN
3.0000 mL | Freq: Once | RESPIRATORY_TRACT | Status: AC
  Administered 2021-05-01: 3 mL via RESPIRATORY_TRACT
  Filled 2021-05-01: qty 3

## 2021-05-01 NOTE — Discharge Instructions (Addendum)
Your cardiac enzymes were mildly elevated but did not change over period of time.  If you continue to have shortness of breath please follow-up with the cardiologist.  You should also see your primary care provider regarding your symptoms.  If you have chest pain or worsening shortness of breath, please return to the emergency department.

## 2021-05-01 NOTE — ED Triage Notes (Signed)
Pt to ED for chest tightness, shob and abdominal distention that started this am.  Hx MI, stroke Pt alert and oriented, NAD noted. RR even and unlabored

## 2021-05-01 NOTE — ED Provider Notes (Signed)
[facility]  ____________________________________________   Event Date/Time   First MD Initiated Contact with Patient 05/01/21 [phone]     (approximate)  I have reviewed the triage vital signs and the nursing notes.   HISTORY  Chief Complaint Chest Pain and Shortness of Breath    HPI Tyler Martinez is a 50 y.o. male past medical history of hypertension hyperlipidemia, CAD status post MI who presents with abdominal distention and shortness of breath.  Patient tells me that over the last several days his abdomen has been feeling swollen and that is making him have difficulty breathing.  He denies associate abdominal pain.  He also feels nauseous but is not vomiting.  He also has several other complaints including intermittent tingling in the bilateral upper extremities, as well as feeling disoriented.  He drinks nightly about 1 pint of 40% alcohol.  Denies any history of withdrawal or history of known liver disease.  No hematemesis, melena or bright red blood per rectum.  Patient notes that the symptoms have happened several days monthly for the past 2 to 3 months.  This is the first time he is being evaluated for it.  He denies lower extremity swelling.  Despite triage complaint he denies chest pain.      Past Medical History:  Diagnosis Date   Anxiety    Arthritis    Cardiac arrest ([place]) 2008   Heart attack ([place])    Hyperlipidemia    Hypertension    MI (myocardial infarction) [facility])     Patient Active Problem List   Diagnosis Date Noted   History of substance abuse ([place]) 01/12/2020   Alcohol dependence ([place]) 01/12/2020   Coronary artery disease involving native coronary artery of native heart without angina pectoris 04/14/2019   Essential hypertension 04/14/2019   Hyperlipidemia associated with type 2 diabetes mellitus ([place]) 04/14/2019   History of cerebrovascular accident (CVA) with residual deficit 04/14/2019   Residual cognitive deficit as late effect  of stroke 04/14/2019   Type 2 diabetes mellitus with vascular disease ([place]) 04/14/2019   Diabetic polyneuropathy associated with type 2 diabetes mellitus ([place]) 04/14/2019   Rheumatoid arthritis involving multiple sites ([place]) 04/14/2019   Flat foot 04/14/2019   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 Authorizing Provider  Blood Glucose Monitoring Suppl (ONETOUCH VERIO) w/Device KIT Use to check blood sugar up to twice a day. 01/12/20   Karamalegos, Devonne Doughty, DO  carvedilol (COREG) 6.25 MG tablet Take 1 tablet (6.25 mg total) by mouth 2 (two) times daily with a meal. 02/06/21   Karamalegos, Devonne Doughty, DO  clopidogrel (PLAVIX) 75 MG tablet Take 1 tablet (75 mg total) by mouth daily. 02/06/21   Karamalegos, Devonne Doughty, DO  gabapentin (NEURONTIN) 800 MG tablet Take 1 tablet (800 mg total) by mouth 4 (four) times daily. 02/06/21   Karamalegos, Devonne Doughty, DO  Lancets [facility] ULTRASOFT) lancets Use to check blood sugar up to twice a day. 01/12/20   Karamalegos, Devonne Doughty, DO  lisinopril-hydrochlorothiazide (ZESTORETIC) 20-12.5 MG tablet Take 1 tablet by mouth daily. 03/20/21   Karamalegos, Devonne Doughty, DO  metFORMIN (GLUCOPHAGE) 1000 MG tablet Take 1 tablet (1,000 mg total) by mouth 2 (two) times daily with a meal.  02/06/21   Karamalegos, Devonne Doughty, DO  ONETOUCH VERIO test strip Use to check blood sugar up to twice a day. 01/12/20   Karamalegos, Devonne Doughty, DO  rosuvastatin (CRESTOR) 10 MG tablet Take 1 tablet (10 mg total) by mouth at bedtime. 02/06/21   Karamalegos, Devonne Doughty, DO  traMADol (ULTRAM) 50 MG tablet Take 1 tablet (50 mg total) by mouth every 8 (eight) hours as needed. 03/20/21   Olin Hauser, DO    Allergies Leflunomide, Methotrexate  derivatives, and Other  No family history on file.  Social History Social History   Tobacco Use   Smoking status: Every Day    Packs/day: 1.00    Types: Cigarettes   Smokeless tobacco: Never  Substance Use Topics   Alcohol use: Yes    Alcohol/week: 2.0 standard drinks    Types: 2 Standard drinks or equivalent per week   Drug use: Not Currently    Review of Systems   Review of Systems  Constitutional:  Negative for chills, fatigue and fever.  Respiratory:  Positive for cough and shortness of breath. Negative for chest tightness.   Cardiovascular:  Negative for chest pain.  Gastrointestinal:  Positive for abdominal distention and nausea. Negative for abdominal pain and vomiting.  Genitourinary:  Negative for dysuria.  Neurological:  Negative for headaches.  Psychiatric/Behavioral:  Positive for confusion.    Physical Exam Updated Vital Signs BP (!) 146/104   Pulse 76   Temp 98.9 F (37.2 C) (Oral)   Resp 18   Ht 6' (1.829 m)   Wt 98.9 kg   SpO2 93%   BMI 29.57 kg/m   Physical Exam Vitals and nursing note reviewed.  Constitutional:      General: He is not in acute distress.    Appearance: Normal appearance.  HENT:     Head: Normocephalic and atraumatic.  Eyes:     General: No scleral icterus.    Conjunctiva/sclera: Conjunctivae normal.  Cardiovascular:     Rate and Rhythm: Normal rate and regular rhythm.     Heart sounds: Normal heart sounds.  Pulmonary:     Effort: Pulmonary effort is normal. No respiratory distress.     Breath sounds: No wheezing.     Comments: Mild expiratory wheeze diffusely Abdominal:     Palpations: Abdomen is soft.     Tenderness: There is no abdominal tenderness. There is no guarding or rebound.  Musculoskeletal:        General: No deformity or signs of injury.     Cervical back: Normal range of motion.     Right lower leg: No edema.     Left lower leg: No edema.  Skin:    General: Skin is warm.     Coloration: Skin is not  jaundiced or pale.  Neurological:     General: No focal deficit present.     Mental Status: He is alert and oriented to person, place, and time. Mental status is at baseline.     Comments: No asterixis  Psychiatric:        Mood and Affect: Mood normal.        Behavior: Behavior normal.     LABS (all labs ordered are listed, but only abnormal results are displayed)  Labs Reviewed  CBC - Abnormal; Notable for the following components:      Result Value   RBC 4.03 (*)    MCV 101.2 (*)    MCH 35.0 (*)    All other components  within normal limits  COMPREHENSIVE METABOLIC PANEL - Abnormal; Notable for the following components:   Glucose, Bld 142 (*)    BUN 34 (*)    Total Protein 6.2 (*)    All other components within normal limits  TROPONIN I (HIGH SENSITIVITY) - Abnormal; Notable for the following components:   Troponin I (High Sensitivity) 26 (*)    All other components within normal limits  TROPONIN I (HIGH SENSITIVITY) - Abnormal; Notable for the following components:   Troponin I (High Sensitivity) 26 (*)    All other components within normal limits  AMMONIA  BRAIN NATRIURETIC PEPTIDE  PROTIME-INR  LIPASE, BLOOD  TROPONIN I (HIGH SENSITIVITY)   ____________________________________________  EKG  EKG #1: Normal sinus rhythm, normal intervals, normal axis, Q waves in 3 and aVF, no acute ischemic changes  EKG #2: unchanged ____________________________________________  RADIOLOGY I, Madelin Headings, personally viewed and evaluated these images (plain radiographs) as part of my medical decision making, as well as reviewing the written report by the radiologist.  ED MD interpretation: I reviewed the chest x-ray which does not show any acute cardiopulmonary process    ____________________________________________   PROCEDURES  Procedure(s) performed (including Critical Care):  Procedures   ____________________________________________   INITIAL IMPRESSION /  ASSESSMENT AND PLAN / ED COURSE     50 year old male who presents with multiple complaints including abdominal distention, nausea paresthesias and confusion.  Vital signs within normal limits.  He is well-appearing, alert and oriented x3, no tremors or asterixis.  Patient has a history of heavy alcohol use and cirrhosis could be explaining all of the symptoms.  We will check labs to evaluate his liver function as well as synthetic function.  He does have some expiratory wheezing on exam so we will treat with albuterol and Atrovent.  Low suspicion for CHF, he has no peripheral edema but will check BNP.  Patient's labs are reassuring, INR normal LFTs within normal limits.  His MCV is elevated at 101 which is consistent with alcohol use.  Ammonia normal.  His initial troponin was 16 and on repeat is 26.  Patient denies any chest pain at this time.  Repeat was sent at 6 hours which again is 26 which is unchanged from prior.  Repeat EKG was again done which does not show any acute ischemic changes.  He still has the Q waves in inferior leads.  With not having any chest pain or third troponin that is stable and no ischemic changes on EKG I do feel like he can be managed as an outpatient. Will refer to cardiology.         ____________________________________________   FINAL CLINICAL IMPRESSION(S) / ED DIAGNOSES  Final diagnoses:  Abdominal distension     ED Discharge Orders     None        Note:  This document was prepared using Dragon voice recognition software and may include unintentional dictation errors.    Rada Hay, MD 05/01/21 [phone]

## 2021-05-02 ENCOUNTER — Other Ambulatory Visit: Payer: Self-pay | Admitting: Family Medicine

## 2021-05-02 DIAGNOSIS — I2510 Atherosclerotic heart disease of native coronary artery without angina pectoris: Secondary | ICD-10-CM

## 2021-05-02 DIAGNOSIS — I10 Essential (primary) hypertension: Secondary | ICD-10-CM

## 2021-05-02 NOTE — Telephone Encounter (Signed)
Requested Prescriptions  Pending Prescriptions Disp Refills  . carvedilol (COREG) 6.25 MG tablet [Pharmacy Med Name: CARVEDILOL 6.25 MG TABLET] 180 tablet 0    Sig: TAKE 1 TABLET BY MOUTH 2 TIMES DAILY WITH A MEAL.     Cardiovascular:  Beta Blockers Failed - 05/02/2021  8:59 AM      Failed - Last BP in normal range    BP Readings from Last 1 Encounters:  05/01/21 (!) 146/104         Passed - Last Heart Rate in normal range    Pulse Readings from Last 1 Encounters:  05/01/21 76         Passed - Valid encounter within last 6 months    Recent Outpatient Visits          4 weeks ago Acute bilateral low back pain with left-sided sciatica   [place], DO   1 month ago Essential hypertension   [place], DO   2 months ago Essential hypertension   [place], DO   1 year ago Type 2 diabetes mellitus with vascular disease [facility])   [place], DO   1 year ago Erectile dysfunction, unspecified erectile dysfunction type   [place], DO      Future Appointments            In 3 months [facility], [facility]

## 2021-05-03 ENCOUNTER — Encounter: Payer: Self-pay | Admitting: Cardiovascular Disease

## 2021-05-03 ENCOUNTER — Ambulatory Visit: Payer: Medicare Other | Admitting: Cardiovascular Disease

## 2021-05-03 ENCOUNTER — Other Ambulatory Visit: Payer: Self-pay

## 2021-05-03 VITALS — BP 140/90 | HR 79 | Ht 72.0 in | Wt 214.5 lb

## 2021-05-03 DIAGNOSIS — I201 Angina pectoris with documented spasm: Secondary | ICD-10-CM | POA: Diagnosis not present

## 2021-05-03 DIAGNOSIS — Z955 Presence of coronary angioplasty implant and graft: Secondary | ICD-10-CM

## 2021-05-03 DIAGNOSIS — I2510 Atherosclerotic heart disease of native coronary artery without angina pectoris: Secondary | ICD-10-CM

## 2021-05-03 NOTE — Patient Instructions (Addendum)
Medication Instructions:  Try OTC omeprazole, GAS-x  If you need a refill on your cardiac medications before your next appointment, please call your pharmacy.   Lab work: No new labs needed  Testing/Procedures: (we will call with results) Echo  [place] has requested that you have an echocardiogram. Echocardiography is a painless test that uses sound waves to create images of your heart. It provides your doctor with information about the size and shape of your heart and how well your heart's chambers and valves are working. This procedure takes approximately one hour. There are no restrictions for this procedure.  There is a possibility that an IV may need to be started during your test to inject an image enhancing agent. This is done to obtain more optimal pictures of your heart. Therefore we ask that you do at least drink some water prior to coming in to hydrate your veins.   [facility] MYOVIEW Veterinary surgeon)  Your caregiver has ordered a Stress Test with nuclear imaging. The purpose of this test is to evaluate the blood supply to your heart muscle. This procedure is referred to as a "Non-Invasive Stress Test." This is because other than having an IV started in your vein, nothing is inserted or "invades" your body. Cardiac stress tests are done to find areas of poor blood flow to the heart by determining the extent of coronary artery disease (CAD). Some patients exercise on a treadmill, which naturally increases the blood flow to your heart, while others who are  unable to walk on a treadmill due to physical limitations have a pharmacologic/chemical stress agent called Lexiscan . This medicine will mimic walking on a treadmill by temporarily increasing your coronary blood flow.   Please note: these test may take anywhere between 2-4 hours to complete  PLEASE REPORT TO [place] WILL DIRECT YOU WHERE TO GO  Instructions  regarding medication:   _X__ : Hold diabetes medication morning of procedure Hold metformin 24 hrs prior to procedure and 48 hours after the procedure. May take metformin after the 48 hours.   _X__:  Hold betablocker(s) night before procedure and morning of procedure Carvedilol   __X_:  Hold other medications as follows: Hydrochlorothiazide (fluid pill)  How to prepare for your Myoview test:  Do not eat or drink after midnight No caffeine for 24 hours prior to test No smoking 24 hours prior to test. ALL your medication may be taken with a few sips of water.   (Except for the meds mention above) Please wear a short sleeve shirt. Comfortable pants are appropriate. No perfume, cologne or lotion.  PLEASE NOTIFY THE OFFICE AT LEAST 67 HOURS IN ADVANCE IF YOU ARE UNABLE TO KEEP YOUR APPOINTMENT.  [phone] AND  PLEASE NOTIFY NUCLEAR MEDICINE AT [facility] AT LEAST 24 HOURS IN ADVANCE IF YOU ARE UNABLE TO KEEP YOUR APPOINTMENT. [phone]    Follow-Up: At [facility], you and your health needs are our priority.  As part of our continuing mission to provide you with exceptional heart care, we have created designated Provider Care Teams.  These Care Teams include your primary Cardiologist (physician) and Advanced Practice Providers (APPs -  Physician Assistants and Nurse Practitioners) who all work together to provide you with the care you need, when you need it.  You will need a follow up appointment as needed At least once a year  Providers on your designated Care Team:   Harrell Gave  Sharolyn Douglas, NP Christell Faith, PA-C Marrianne Mood, PA-C [place]ence [place], [place]  COVID-19 Vaccine Information can be found at: [email or web address].uk For questions related to vaccine distribution or appointments, please email [email or web address] or call [phone].
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
## 2021-05-08 ENCOUNTER — Telehealth: Payer: Self-pay | Admitting: Family Medicine

## 2021-05-08 ENCOUNTER — Telehealth: Payer: Self-pay | Admitting: Pharmacist

## 2021-05-08 ENCOUNTER — Telehealth: Payer: Medicare Other

## 2021-05-08 NOTE — Telephone Encounter (Signed)
  Chronic Care Management   Outreach Note  05/08/2021 Name: Tyler Martinez MRN: [id number] DOB: 11/15/1970  Referred by: Olin Hauser, DO Reason for referral : No chief complaint on file.   Was unable to reach patient via telephone today and unable to leave a message as voicemail is not setup.   Follow Up Plan: Will attempt to reach patient by telephone again within the next 2 weeks  Harlow Asa, PharmD, [place] [facility] [phone]

## 2021-05-17 ENCOUNTER — Ambulatory Visit: Payer: Medicare Other | Admitting: Internal Medicine

## 2021-05-17 ENCOUNTER — Other Ambulatory Visit: Payer: Self-pay

## 2021-05-17 ENCOUNTER — Encounter: Payer: Self-pay | Admitting: Emergency Medicine

## 2021-05-17 ENCOUNTER — Emergency Department
Admission: EM | Admit: 2021-05-17 | Discharge: 2021-05-17 | Disposition: A | Discharge status: Home / Self Care | Payer: Medicare Other | Attending: Emergency Medicine | Admitting: Emergency Medicine

## 2021-05-17 ENCOUNTER — Telehealth: Payer: Medicare Other

## 2021-05-17 ENCOUNTER — Emergency Department: Payer: Medicare Other

## 2021-05-17 DIAGNOSIS — Z7902 Long term (current) use of antithrombotics/antiplatelets: Secondary | ICD-10-CM | POA: Insufficient documentation

## 2021-05-17 DIAGNOSIS — R0902 Hypoxemia: Secondary | ICD-10-CM | POA: Diagnosis not present

## 2021-05-17 DIAGNOSIS — E1142 Type 2 diabetes mellitus with diabetic polyneuropathy: Secondary | ICD-10-CM | POA: Insufficient documentation

## 2021-05-17 DIAGNOSIS — Z7984 Long term (current) use of oral hypoglycemic drugs: Secondary | ICD-10-CM | POA: Diagnosis not present

## 2021-05-17 DIAGNOSIS — R0602 Shortness of breath: Secondary | ICD-10-CM | POA: Diagnosis not present

## 2021-05-17 DIAGNOSIS — Z79899 Other long term (current) drug therapy: Secondary | ICD-10-CM | POA: Diagnosis not present

## 2021-05-17 DIAGNOSIS — I10 Essential (primary) hypertension: Secondary | ICD-10-CM | POA: Diagnosis not present

## 2021-05-17 DIAGNOSIS — R062 Wheezing: Secondary | ICD-10-CM | POA: Diagnosis not present

## 2021-05-17 DIAGNOSIS — F17210 Nicotine dependence, cigarettes, uncomplicated: Secondary | ICD-10-CM | POA: Insufficient documentation

## 2021-05-17 DIAGNOSIS — J449 Chronic obstructive pulmonary disease, unspecified: Secondary | ICD-10-CM | POA: Diagnosis not present

## 2021-05-17 DIAGNOSIS — I2510 Atherosclerotic heart disease of native coronary artery without angina pectoris: Secondary | ICD-10-CM | POA: Insufficient documentation

## 2021-05-17 DIAGNOSIS — Z743 Need for continuous supervision: Secondary | ICD-10-CM | POA: Diagnosis not present

## 2021-05-17 DIAGNOSIS — Z20822 Contact with and (suspected) exposure to covid-19: Secondary | ICD-10-CM | POA: Diagnosis not present

## 2021-05-17 DIAGNOSIS — E871 Hypo-osmolality and hyponatremia: Secondary | ICD-10-CM

## 2021-05-17 DIAGNOSIS — R6889 Other general symptoms and signs: Secondary | ICD-10-CM | POA: Diagnosis not present

## 2021-05-17 HISTORY — DX: Atherosclerotic heart disease of native coronary artery without angina pectoris: I25.10

## 2021-05-17 HISTORY — DX: Cerebral infarction, unspecified: I63.9

## 2021-05-17 LAB — CBC
HCT: 40.8 % (ref 39.0–52.0)
Hemoglobin: 15.1 g/dL (ref 13.0–17.0)
MCH: 36 pg — ABNORMAL HIGH (ref 26.0–34.0)
MCHC: 37 g/dL — ABNORMAL HIGH (ref 30.0–36.0)
MCV: 97.1 fL (ref 80.0–100.0)
Platelets: 272 10*3/uL (ref 150–400)
RBC: 4.2 MIL/uL — ABNORMAL LOW (ref 4.22–5.81)
RDW: 13.6 % (ref 11.5–15.5)
WBC: 7.2 10*3/uL (ref 4.0–10.5)
nRBC: 0 % (ref 0.0–0.2)

## 2021-05-17 LAB — BASIC METABOLIC PANEL
Anion gap: 13 (ref 5–15)
BUN: 12 mg/dL (ref 6–20)
CO2: 26 mmol/L (ref 22–32)
Calcium: 8.9 mg/dL (ref 8.9–10.3)
Chloride: 89 mmol/L — ABNORMAL LOW (ref 98–111)
Creatinine, Ser: 0.8 mg/dL (ref 0.61–1.24)
GFR, Estimated: >60 mL/min (ref >60)
Glucose, Bld: 261 mg/dL — ABNORMAL HIGH (ref 70–99)
Potassium: 3.6 mmol/L (ref 3.5–5.1)
Sodium: 128 mmol/L — ABNORMAL LOW (ref 135–145)

## 2021-05-17 LAB — TROPONIN I (HIGH SENSITIVITY)
Troponin I (High Sensitivity): 13 ng/L (ref <18)
Troponin I (High Sensitivity): 12 ng/L (ref <18)

## 2021-05-17 LAB — RESP PANEL BY RT-PCR (FLU A&B, COVID) ARPGX2
Influenza A by PCR: NEGATIVE
Influenza B by PCR: NEGATIVE
SARS Coronavirus 2 by RT PCR: NEGATIVE

## 2021-05-17 MED ORDER — LORAZEPAM 1 MG PO TABS: 1.0000 mg | ORAL_TABLET | Freq: Once | ORAL | Status: DC

## 2021-05-17 MED ORDER — SODIUM CHLORIDE 0.9 % IV BOLUS
1000.0000 mL | Freq: Once | INTRAVENOUS | Status: AC
  Administered 2021-05-17: 1000 mL via INTRAVENOUS

## 2021-05-17 MED ORDER — LORAZEPAM 1 MG PO TABS
1.0000 mg | ORAL_TABLET | Freq: Once | ORAL | Status: AC
  Administered 2021-05-17: 1 mg via ORAL
  Filled 2021-05-17: qty 1

## 2021-05-17 NOTE — ED Provider Notes (Signed)
Emergency Medicine Provider Triage Evaluation Note  Tyler Martinez , a 50 y.o. male  was evaluated in triage.  Pt complains of hot flashes. Arrived via EMS who was called out for shortness of breath. Received DuoNeg and solumedrol en rout. Patient reports breathing is better, now just "hot and cold.." Smokes cigarettes.  Review of Systems  Positive: Shortness of breath, chills Negative: Chest pain  Physical Exam  There were no vitals taken for this visit. Gen:   Awake, no distress   Resp:  Normal effort  MSK:   Moves extremities without difficulty  Other:    Medical Decision Making  Medically screening exam initiated at 10:48 AM.  Appropriate orders placed.  Sarim Rothman was informed that the remainder of the evaluation will be completed by another provider, this initial triage assessment does not replace that evaluation, and the importance of remaining in the ED until their evaluation is complete.    Victorino Dike, FNP 05/17/21 1124    Lucrezia Starch, MD 05/17/21 [phone]

## 2021-05-17 NOTE — ED Notes (Signed)
Attempted to call pt to see if IV was still in place when pt left. No answer.

## 2021-05-17 NOTE — ED Provider Notes (Signed)
[facility] Emergency Department Provider Note    Event Date/Time   First MD Initiated Contact with Patient 05/17/21 1349     (approximate)  I have reviewed the triage vital signs and the nursing notes.   HISTORY  Chief Complaint Shortness of Breath    HPI Tyler Martinez is a 50 y.o. male the below listed past medical history presents to the ER for evaluation of shortness of breath feeling very anxious.  Woke up feeling this way.  Has not had any orthopnea feels like he is getting hot and cold but no measured temperature.  No cough no productive sputum.  Denies any abdominal pain.  No nausea or vomiting.  Patient called EMS because he felt ill and like his shortness of breath was getting worse while at work today.  EMS was gave him steroids as well as breathing treatment.  Was never hypoxic.  Past Medical History:  Diagnosis Date   Anxiety    Arthritis    Cardiac arrest ([place]) 2008   Coronary artery disease    Heart attack ([place])    Hyperlipidemia    Hypertension    MI (myocardial infarction) ([place])    Stroke ([place])    Family History  Problem Relation Age of Onset   Cancer Mother    Hyperlipidemia Father    Hypertension Father    Heart attack Father 91   Diabetes Father    Hypertension Brother    Past Surgical History:  Procedure Laterality Date   CAROTID STENT  2008   EXPLORATION POST OPERATIVE OPEN HEART  2008   HERNIA REPAIR  2008   inguinal   Patient Active Problem List   Diagnosis Date Noted   History of substance abuse ([place]) 01/12/2020   Alcohol dependence ([place]) 01/12/2020   Coronary artery disease involving native coronary artery of native heart without angina pectoris 04/14/2019   Essential hypertension 04/14/2019   Hyperlipidemia associated with type 2 diabetes mellitus ([place]) 04/14/2019   History of cerebrovascular accident (CVA) with residual deficit 04/14/2019   Residual cognitive deficit as late effect of stroke 04/14/2019    Type 2 diabetes mellitus with vascular disease ([place]) 04/14/2019   Diabetic polyneuropathy associated with type 2 diabetes mellitus ([place]) 04/14/2019   Rheumatoid arthritis involving multiple sites ([place]) 04/14/2019   Flat foot 04/14/2019   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 Authorizing Provider  Blood Glucose Monitoring Suppl (ONETOUCH VERIO) w/Device KIT Use to check blood sugar up to twice a day. 01/12/20   Karamalegos, Devonne Doughty, DO  carvedilol (COREG) 6.25 MG tablet TAKE 1 TABLET BY MOUTH 2 TIMES DAILY WITH A MEAL. 05/02/21   Karamalegos, Devonne Doughty, DO  clopidogrel (PLAVIX) 75 MG tablet Take 1 tablet (75 mg total) by mouth daily. 02/06/21   Karamalegos, Devonne Doughty, DO  gabapentin (NEURONTIN) 800 MG tablet Take 1 tablet (800 mg total) by mouth 4 (four) times daily. 02/06/21   Karamalegos, Devonne Doughty, DO  Lancets [facility] ULTRASOFT) lancets Use to check blood sugar up to twice a day. 01/12/20   Karamalegos, Devonne Doughty, DO  lisinopril-hydrochlorothiazide (ZESTORETIC) 20-12.5 MG tablet Take 1 tablet by mouth daily. 03/20/21   Parks Ranger, Devonne Doughty, DO  metFORMIN (GLUCOPHAGE) 1000 MG tablet Take 1 tablet (1,000 mg total) by mouth 2 (two) times daily with a meal. 02/06/21   Karamalegos, Devonne Doughty, DO  ONETOUCH VERIO test strip Use to check  blood sugar up to twice a day. 01/12/20   Karamalegos, Devonne Doughty, DO  rosuvastatin (CRESTOR) 10 MG tablet Take 1 tablet (10 mg total) by mouth at bedtime. 02/06/21   Karamalegos, Devonne Doughty, DO  traMADol (ULTRAM) 50 MG tablet Take 1 tablet (50 mg total) by mouth every 8 (eight) hours as needed. 03/20/21   Karamalegos, Devonne Doughty, DO    Allergies Leflunomide, Methotrexate derivatives, and Other    Social History Social History   Tobacco Use   Smoking status: Every Day    Packs/day: 1.00    Years: 8.00    Pack years: 8.00    Types: Cigarettes   Smokeless tobacco:  Never  Vaping Use   Vaping Use: Never used  Substance Use Topics   Alcohol use: Yes    Alcohol/week: 2.0 standard drinks    Types: 2 Standard drinks or equivalent per week    Comment: Drinks mixed drinks every evening.   Drug use: Not Currently    Review of Systems Patient denies headaches, rhinorrhea, blurry vision, numbness, shortness of breath, chest pain, edema, cough, abdominal pain, nausea, vomiting, diarrhea, dysuria, fevers, rashes or hallucinations unless otherwise stated above in HPI. ____________________________________________   PHYSICAL EXAM:  VITAL SIGNS: Vitals:   05/17/21 1255 05/17/21 1257  BP: (!) 153/103 (!) 153/103  Pulse: 86 83  Resp: 16 15  Temp: 98.5 F (36.9 C) 98.6 F (37 C)  SpO2: 96% 97%    Constitutional: Alert and oriented. Anxious appearing Eyes: Conjunctivae are normal.  Head: Atraumatic. Nose: No congestion/rhinnorhea. Mouth/Throat: Mucous membranes are moist.   Neck: No stridor. Painless ROM.  Cardiovascular: Normal rate, regular rhythm. Grossly normal heart sounds.  Good peripheral circulation. Respiratory: Normal respiratory effort.  No retractions. Lungs CTAB. Gastrointestinal: Soft and nontender. No distention. No abdominal bruits. No CVA tenderness. Genitourinary:  Musculoskeletal: No lower extremity tenderness nor edema.  No joint effusions. Neurologic:  Normal speech and language. No gross focal neurologic deficits are appreciated. No facial droop Skin:  Skin is warm, dry and intact. No rash noted. Psychiatric: Mood and affect are anxious. speech and behavior are normal.  ____________________________________________   LABS (all labs ordered are listed, but only abnormal results are displayed)  Results for orders placed or performed during the hospital encounter of 05/17/21 (from the past 24 hour(s))  Basic metabolic panel     Status: Abnormal   Collection Time: 05/17/21 10:52 AM  Result Value Ref Range   Sodium 128 (L) 135  - 145 mmol/L   Potassium 3.6 3.5 - 5.1 mmol/L   Chloride 89 (L) 98 - 111 mmol/L   CO2 26 22 - 32 mmol/L   Glucose, Bld 261 (H) 70 - 99 mg/dL   BUN 12 6 - 20 mg/dL   Creatinine, Ser 0.80 0.61 - 1.24 mg/dL   Calcium 8.9 8.9 - 10.3 mg/dL   GFR, Estimated >60 >60 mL/min   Anion gap 13 5 - 15  CBC     Status: Abnormal   Collection Time: 05/17/21 10:52 AM  Result Value Ref Range   WBC 7.2 4.0 - 10.5 K/uL   RBC 4.20 (L) 4.22 - 5.81 MIL/uL   Hemoglobin 15.1 13.0 - 17.0 g/dL   HCT 40.8 39.0 - 52.0 %   MCV 97.1 80.0 - 100.0 fL   MCH 36.0 (H) 26.0 - 34.0 pg   MCHC 37.0 (H) 30.0 - 36.0 g/dL   RDW 13.6 11.5 - 15.5 %   Platelets 272 150 - 400 K/uL  nRBC 0.0 0.0 - 0.2 %  Troponin I (High Sensitivity)     Status: None   Collection Time: 05/17/21 10:52 AM  Result Value Ref Range   Troponin I (High Sensitivity) 13 <18 ng/L  Troponin I (High Sensitivity)     Status: None   Collection Time: 05/17/21 12:52 PM  Result Value Ref Range   Troponin I (High Sensitivity) 12 <18 ng/L   ____________________________________________  EKG My review and personal interpretation at Time: 10:53   Indication: sob  Rate: 90  Rhythm: sinus Axis: normal Other: normal intervals, no stemi, no significant changes form prior ____________________________________________  RADIOLOGY  I personally reviewed all radiographic images ordered to evaluate for the above acute complaints and reviewed radiology reports and findings.  These findings were personally discussed with the patient.  Please see medical record for radiology report.  ____________________________________________   PROCEDURES  Procedure(s) performed:  Procedures    Critical Care performed: no ____________________________________________   INITIAL IMPRESSION / ASSESSMENT AND PLAN / ED COURSE  Pertinent labs & imaging results that were available during my care of the patient were reviewed by me and considered in my medical decision making (see  chart for details).   DDX: Asthma, copd, CHF, pna, ptx, malignancy, Pe, anemia   Tyler Martinez is a 49 y.o. who presents to the ED with presentation as described above.  Patient arrives anxious but nontoxic-appearing is not hypoxic no wheezing on exam good air movement throughout.  Chest x-ray with no acute changes no pneumothorax.  He is low risk by Wells criteria is PERC negative.  Does not seem consistent with PE.  No findings to suggest CHF.  EKG is nonischemic and serial enzymes are negative.  Mildly hyponatremic will give IV hydration.  Patient fairly anxious appearing will give Ativan and observe and reassess.  Clinical Course as of 05/17/21 1514  Wed May 17, 2021  1512 Patient feels improved after IV hydration as well as Ativan.  I think this is more likely related to some underlying stress and anxiety as his cardiac work-up is reassuring not showing any signs of wheezing or COPD exacerbation.  His exam is otherwise reassuring.  I think he is appropriate and stable for outpatient follow-up at this point.  We discussed strict return precautions and importance of follow-up with PCP for repeat blood work. [PR]    Clinical Course User Index [PR] Merlyn Lot, MD    The patient was evaluated in Emergency Department today for the symptoms described in the history of present illness. He/she was evaluated in the context of the global COVID-19 pandemic, which necessitated consideration that the patient might be at risk for infection with the SARS-CoV-2 virus that causes COVID-19. Institutional protocols and algorithms that pertain to the evaluation of patients at risk for COVID-19 are in a state of rapid change based on information released by regulatory bodies including the CDC and federal and state organizations. These policies and algorithms were followed during the patient's care in the ED.  As part of my medical decision making, I reviewed the following data within the electronic medical  record:  Nursing notes reviewed and incorporated, Labs reviewed, notes from prior ED visits and [place] Controlled Substance Database   ____________________________________________   FINAL CLINICAL IMPRESSION(S) / ED DIAGNOSES  Final diagnoses:  Shortness of breath  Low sodium levels      NEW MEDICATIONS STARTED DURING THIS VISIT:  New Prescriptions   No medications on file     Note:  This document was prepared using Dragon voice recognition software and may include unintentional dictation errors.    Merlyn Lot, MD 05/17/21 [phone]

## 2021-05-17 NOTE — Progress Notes (Deleted)
Subjective:    Patient ID: Tyler Martinez, male    DOB: 1971-08-09, 50 y.o.   MRN: [id number]  HPI  Pt presents to the clinic today with c/o foot pain. This started. He describes the pain as. He has a history of DM 2 with neuropathy and flat feet.  Review of Systems     Past Medical History:  Diagnosis Date   Anxiety    Arthritis    Cardiac arrest ([place]) 2008   Heart attack ([place])    Hyperlipidemia    Hypertension    MI (myocardial infarction) ([place])     Current Outpatient Medications  Medication Sig Dispense Refill   Blood Glucose Monitoring Suppl (ONETOUCH VERIO) w/Device KIT Use to check blood sugar up to twice a day. 1 kit 0   carvedilol (COREG) 6.25 MG tablet TAKE 1 TABLET BY MOUTH 2 TIMES DAILY WITH A MEAL. 180 tablet 0   clopidogrel (PLAVIX) 75 MG tablet Take 1 tablet (75 mg total) by mouth daily. 30 tablet 5   gabapentin (NEURONTIN) 800 MG tablet Take 1 tablet (800 mg total) by mouth 4 (four) times daily. 120 tablet 5   Lancets (ONETOUCH ULTRASOFT) lancets Use to check blood sugar up to twice a day. 200 each 5   lisinopril-hydrochlorothiazide (ZESTORETIC) 20-12.5 MG tablet Take 1 tablet by mouth daily. 90 tablet 3   metFORMIN (GLUCOPHAGE) 1000 MG tablet Take 1 tablet (1,000 mg total) by mouth 2 (two) times daily with a meal. 60 tablet 5   ONETOUCH VERIO test strip Use to check blood sugar up to twice a day. 200 each 5   rosuvastatin (CRESTOR) 10 MG tablet Take 1 tablet (10 mg total) by mouth at bedtime. 30 tablet 5   traMADol (ULTRAM) 50 MG tablet Take 1 tablet (50 mg total) by mouth every 8 (eight) hours as needed. 60 tablet 2   No current facility-administered medications for this visit.    Allergies  Allergen Reactions   Leflunomide Nausea And Vomiting   Methotrexate Derivatives Nausea And Vomiting    Have RA but can not tolerate any  Of the med  No components found with this name: [id number] saw Rheumatology in 2005   Other Nausea And Vomiting    Have RA but can  not tolerate any  Of the med  No components found with this name: [id number] saw Rheumatology in 2005    Family History  Problem Relation Age of Onset   Cancer Mother    Hyperlipidemia Father    Hypertension Father    Heart attack Father 63   Diabetes Father    Hypertension Brother     Social History   Socioeconomic History   Marital status: Divorced    Spouse name: Not on file   Number of children: Not on file   Years of education: Not on file   Highest education level: Not on file  Occupational History   Occupation: disability  Tobacco Use   Smoking status: Every Day    Packs/day: 1.00    Years: 8.00    Pack years: 8.00    Types: Cigarettes   Smokeless tobacco: Never  Vaping Use   Vaping Use: Never used  Substance and Sexual Activity   Alcohol use: Yes    Alcohol/week: 2.0 standard drinks    Types: 2 Standard drinks or equivalent per week    Comment: Drinks mixed drinks every evening.   Drug use: Not Currently   Sexual activity: Not on file  Other  Topics Concern   Not on file  Social History Narrative   Not on file   Social Determinants of Health   Financial Resource Strain: Low Risk    Difficulty of Paying Living Expenses: Not hard at all  Food Insecurity: No Food Insecurity   Worried About Charity fundraiser in the Last Year: Never true   [place] in the Last Year: Never true  Transportation Needs: No Transportation Needs   Lack of Transportation (Medical): No   Lack of Transportation (Non-Medical): No  Physical Activity: Inactive   Days of Exercise per Week: 0 days   Minutes of Exercise per Session: 0 min  Stress: No Stress Concern Present   Feeling of Stress : Not at all  Social Connections: Not on file  Intimate Partner Violence: Not on file     Constitutional: Denies fever, malaise, fatigue, headache or abrupt weight changes.  HEENT: Denies eye pain, eye redness, ear pain, ringing in the ears, wax buildup, runny nose, nasal congestion,  bloody nose, or sore throat. Respiratory: Denies difficulty breathing, shortness of breath, cough or sputum production.   Cardiovascular: Denies chest pain, chest tightness, palpitations or swelling in the hands or feet.  Gastrointestinal: Denies abdominal pain, bloating, constipation, diarrhea or blood in the stool.  GU: Denies urgency, frequency, pain with urination, burning sensation, blood in urine, odor or discharge. Musculoskeletal: Denies decrease in range of motion, difficulty with gait, muscle pain or joint pain and swelling.  Skin: Denies redness, rashes, lesions or ulcercations.  Neurological: Denies dizziness, difficulty with memory, difficulty with speech or problems with balance and coordination.  Psych: Denies anxiety, depression, SI/HI.  No other specific complaints in a complete review of systems (except as listed in HPI above).  Objective:   Physical Exam   There were no vitals taken for this visit. Wt Readings from Last 3 Encounters:  05/03/21 214 lb 8 oz (97.3 kg)  05/01/21 218 lb (98.9 kg)  04/04/21 215 lb 6.4 oz (97.7 kg)    General: Appears their stated age, well developed, well nourished in NAD. Skin: Warm, dry and intact. No rashes, lesions or ulcerations noted. HEENT: Head: normal shape and size; Eyes: sclera white, no icterus, conjunctiva pink, PERRLA and EOMs intact; Ears: Tm's gray and intact, normal light reflex; Nose: mucosa pink and moist, septum midline; Throat/Mouth: Teeth present, mucosa pink and moist, no exudate, lesions or ulcerations noted.  Neck:  Neck supple, trachea midline. No masses, lumps or thyromegaly present.  Cardiovascular: Normal rate and rhythm. S1,S2 noted.  No murmur, rubs or gallops noted. No JVD or BLE edema. No carotid bruits noted. Pulmonary/Chest: Normal effort and positive vesicular breath sounds. No respiratory distress. No wheezes, rales or ronchi noted.  Abdomen: Soft and nontender. Normal bowel sounds. No distention or  masses noted. Liver, spleen and kidneys non palpable. Musculoskeletal: Normal range of motion. No signs of joint swelling. No difficulty with gait.  Neurological: Alert and oriented. Cranial nerves II-XII grossly intact. Coordination normal.  Psychiatric: Mood and affect normal. Behavior is normal. Judgment and thought content normal.   EKG:  BMET    Component Value Date/Time   NA 136 05/01/2021 1029   K 4.9 05/01/2021 1029   CL 103 05/01/2021 1029   CO2 26 05/01/2021 1029   GLUCOSE 142 (H) 05/01/2021 1029   BUN 34 (H) 05/01/2021 1029   CREATININE 0.99 05/01/2021 1029   CREATININE 0.75 03/20/2021 1110   CALCIUM 8.9 05/01/2021 1029   GFRNONAA >  60 05/01/2021 1029   GFRNONAA 102 06/03/2019 1123   GFRAA 118 06/03/2019 1123    Lipid Panel     Component Value Date/Time   CHOL 161 03/20/2021 1110   TRIG 107 03/20/2021 1110   HDL 73 03/20/2021 1110   CHOLHDL 2.2 03/20/2021 1110   VLDL 22 09/13/2018 0648   LDLCALC 69 03/20/2021 1110    CBC    Component Value Date/Time   WBC 6.7 05/01/2021 0911   RBC 4.03 (L) 05/01/2021 0911   HGB 14.1 05/01/2021 0911   HCT 40.8 05/01/2021 0911   PLT 273 05/01/2021 0911   MCV 101.2 (H) 05/01/2021 0911   MCH 35.0 (H) 05/01/2021 0911   MCHC 34.6 05/01/2021 0911   RDW 13.2 05/01/2021 0911   LYMPHSABS 1,484 03/20/2021 1110   EOSABS 290 03/20/2021 1110   BASOSABS 41 03/20/2021 1110    Hgb A1C Lab Results  Component Value Date   HGBA1C 6.6 (H) 03/20/2021           Assessment & Plan:     Webb Silversmith, NP This visit occurred during the SARS-CoV-2 public health emergency.  Safety protocols were in place, including screening questions prior to the visit, additional usage of staff PPE, and extensive cleaning of exam room while observing appropriate contact time as indicated for disinfecting solutions.

## 2021-05-17 NOTE — ED Triage Notes (Signed)
Woke up this morning feeling SOB.  SOB has worsened this morning.  Per EMS report, patient had wheezing throughout fields, Duoneb and 125 mg solumedrol given.  Before treatment, RA sat 92%, after treatment sat 95%  CBG:  224

## 2021-05-18 ENCOUNTER — Emergency Department: Payer: Medicare Other

## 2021-05-18 ENCOUNTER — Other Ambulatory Visit: Payer: Self-pay

## 2021-05-18 ENCOUNTER — Observation Stay
Admission: EM | Admit: 2021-05-18 | Discharge: 2021-05-20 | Disposition: A | Discharge status: Home / Self Care | Payer: Medicare Other | Attending: Internal Medicine | Admitting: Internal Medicine

## 2021-05-18 DIAGNOSIS — Z79899 Other long term (current) drug therapy: Secondary | ICD-10-CM | POA: Insufficient documentation

## 2021-05-18 DIAGNOSIS — R9439 Abnormal result of other cardiovascular function study: Secondary | ICD-10-CM

## 2021-05-18 DIAGNOSIS — F17210 Nicotine dependence, cigarettes, uncomplicated: Secondary | ICD-10-CM | POA: Diagnosis not present

## 2021-05-18 DIAGNOSIS — E1169 Type 2 diabetes mellitus with other specified complication: Secondary | ICD-10-CM | POA: Diagnosis present

## 2021-05-18 DIAGNOSIS — E119 Type 2 diabetes mellitus without complications: Secondary | ICD-10-CM | POA: Diagnosis not present

## 2021-05-18 DIAGNOSIS — J441 Chronic obstructive pulmonary disease with (acute) exacerbation: Secondary | ICD-10-CM

## 2021-05-18 DIAGNOSIS — I2510 Atherosclerotic heart disease of native coronary artery without angina pectoris: Secondary | ICD-10-CM | POA: Diagnosis not present

## 2021-05-18 DIAGNOSIS — Z720 Tobacco use: Secondary | ICD-10-CM | POA: Diagnosis present

## 2021-05-18 DIAGNOSIS — I6930 Unspecified sequelae of cerebral infarction: Secondary | ICD-10-CM

## 2021-05-18 DIAGNOSIS — I10 Essential (primary) hypertension: Secondary | ICD-10-CM | POA: Diagnosis not present

## 2021-05-18 DIAGNOSIS — E785 Hyperlipidemia, unspecified: Secondary | ICD-10-CM | POA: Diagnosis not present

## 2021-05-18 DIAGNOSIS — Z8673 Personal history of transient ischemic attack (TIA), and cerebral infarction without residual deficits: Secondary | ICD-10-CM | POA: Insufficient documentation

## 2021-05-18 DIAGNOSIS — F10282 Alcohol dependence with alcohol-induced sleep disorder: Secondary | ICD-10-CM | POA: Diagnosis not present

## 2021-05-18 DIAGNOSIS — R0602 Shortness of breath: Secondary | ICD-10-CM | POA: Diagnosis not present

## 2021-05-18 DIAGNOSIS — E1159 Type 2 diabetes mellitus with other circulatory complications: Secondary | ICD-10-CM | POA: Diagnosis present

## 2021-05-18 DIAGNOSIS — Z7902 Long term (current) use of antithrombotics/antiplatelets: Secondary | ICD-10-CM | POA: Insufficient documentation

## 2021-05-18 DIAGNOSIS — R079 Chest pain, unspecified: Secondary | ICD-10-CM | POA: Diagnosis not present

## 2021-05-18 DIAGNOSIS — F1020 Alcohol dependence, uncomplicated: Secondary | ICD-10-CM | POA: Diagnosis present

## 2021-05-18 DIAGNOSIS — S2231XA Fracture of one rib, right side, initial encounter for closed fracture: Secondary | ICD-10-CM | POA: Diagnosis not present

## 2021-05-18 DIAGNOSIS — Z7984 Long term (current) use of oral hypoglycemic drugs: Secondary | ICD-10-CM | POA: Insufficient documentation

## 2021-05-18 DIAGNOSIS — Z20822 Contact with and (suspected) exposure to covid-19: Secondary | ICD-10-CM | POA: Insufficient documentation

## 2021-05-18 DIAGNOSIS — R0789 Other chest pain: Secondary | ICD-10-CM | POA: Diagnosis not present

## 2021-05-18 DIAGNOSIS — F1021 Alcohol dependence, in remission: Secondary | ICD-10-CM | POA: Diagnosis present

## 2021-05-18 DIAGNOSIS — F17200 Nicotine dependence, unspecified, uncomplicated: Secondary | ICD-10-CM | POA: Diagnosis present

## 2021-05-18 LAB — RESP PANEL BY RT-PCR (FLU A&B, COVID) ARPGX2
Influenza A by PCR: NEGATIVE
Influenza B by PCR: NEGATIVE
SARS Coronavirus 2 by RT PCR: NEGATIVE

## 2021-05-18 LAB — CBC
HCT: 40.7 % (ref 39.0–52.0)
Hemoglobin: 15.2 g/dL (ref 13.0–17.0)
MCH: 36.6 pg — ABNORMAL HIGH (ref 26.0–34.0)
MCHC: 37.3 g/dL — ABNORMAL HIGH (ref 30.0–36.0)
MCV: 98.1 fL (ref 80.0–100.0)
Platelets: 287 10*3/uL (ref 150–400)
RBC: 4.15 MIL/uL — ABNORMAL LOW (ref 4.22–5.81)
RDW: 13.7 % (ref 11.5–15.5)
WBC: 11.4 10*3/uL — ABNORMAL HIGH (ref 4.0–10.5)
nRBC: 0 % (ref 0.0–0.2)

## 2021-05-18 LAB — TROPONIN I (HIGH SENSITIVITY)
Troponin I (High Sensitivity): 16 ng/L (ref <18)
Troponin I (High Sensitivity): 17 ng/L (ref <18)

## 2021-05-18 LAB — BASIC METABOLIC PANEL
Anion gap: 10 (ref 5–15)
BUN: 18 mg/dL (ref 6–20)
CO2: 30 mmol/L (ref 22–32)
Calcium: 9.7 mg/dL (ref 8.9–10.3)
Chloride: 92 mmol/L — ABNORMAL LOW (ref 98–111)
Creatinine, Ser: 0.9 mg/dL (ref 0.61–1.24)
GFR, Estimated: >60 mL/min (ref >60)
Glucose, Bld: 180 mg/dL — ABNORMAL HIGH (ref 70–99)
Potassium: 3.7 mmol/L (ref 3.5–5.1)
Sodium: 132 mmol/L — ABNORMAL LOW (ref 135–145)

## 2021-05-18 LAB — CBG MONITORING, ED: Glucose-Capillary: 177 mg/dL — ABNORMAL HIGH (ref 70–99)

## 2021-05-18 LAB — GLUCOSE, CAPILLARY: Glucose-Capillary: 461 mg/dL — ABNORMAL HIGH (ref 70–99)

## 2021-05-18 LAB — APTT: aPTT: 26 seconds (ref 24–36)

## 2021-05-18 LAB — PROTIME-INR
INR: 0.9 (ref 0.8–1.2)
Prothrombin Time: 12.2 seconds (ref 11.4–15.2)

## 2021-05-18 MED ORDER — HEPARIN BOLUS VIA INFUSION
4000.0000 [IU] | Freq: Once | INTRAVENOUS | Status: AC
  Administered 2021-05-18: 4000 [IU] via INTRAVENOUS
  Filled 2021-05-18: qty 4000

## 2021-05-18 MED ORDER — DM-GUAIFENESIN ER 30-600 MG PO TB12: 1.0000 | ORAL_TABLET | Freq: Two times a day (BID) | ORAL | Status: DC | PRN

## 2021-05-18 MED ORDER — HYDROCHLOROTHIAZIDE 12.5 MG PO CAPS
12.5000 mg | ORAL_CAPSULE | Freq: Every day | ORAL | Status: DC
  Administered 2021-05-18 – 2021-05-20 (×3): 12.5 mg via ORAL
  Filled 2021-05-18 (×3): qty 1

## 2021-05-18 MED ORDER — ONDANSETRON HCL 4 MG/2ML IJ SOLN
4.0000 mg | Freq: Once | INTRAMUSCULAR | Status: AC
  Administered 2021-05-18: 4 mg via INTRAVENOUS
  Filled 2021-05-18: qty 2

## 2021-05-18 MED ORDER — ROSUVASTATIN CALCIUM 10 MG PO TABS
10.0000 mg | ORAL_TABLET | Freq: Every day | ORAL | Status: DC
  Administered 2021-05-18: 10 mg via ORAL
  Filled 2021-05-18: qty 1

## 2021-05-18 MED ORDER — INSULIN ASPART 100 UNIT/ML IJ SOLN
12.0000 [IU] | Freq: Once | INTRAMUSCULAR | Status: AC
  Administered 2021-05-18: 12 [IU] via SUBCUTANEOUS
  Filled 2021-05-18: qty 1

## 2021-05-18 MED ORDER — INSULIN ASPART 100 UNIT/ML IJ SOLN: 0.0000 [IU] | Freq: Three times a day (TID) | INTRAMUSCULAR | Status: DC

## 2021-05-18 MED ORDER — CLOPIDOGREL BISULFATE 75 MG PO TABS
75.0000 mg | ORAL_TABLET | Freq: Every day | ORAL | Status: DC
  Administered 2021-05-18 – 2021-05-20 (×3): 75 mg via ORAL
  Filled 2021-05-18 (×3): qty 1

## 2021-05-18 MED ORDER — CARVEDILOL 6.25 MG PO TABS
6.2500 mg | ORAL_TABLET | Freq: Two times a day (BID) | ORAL | Status: DC
  Administered 2021-05-20: 6.25 mg via ORAL
  Filled 2021-05-18: qty 1

## 2021-05-18 MED ORDER — GABAPENTIN 400 MG PO CAPS
800.0000 mg | ORAL_CAPSULE | Freq: Four times a day (QID) | ORAL | Status: DC
  Administered 2021-05-18 – 2021-05-20 (×4): 800 mg via ORAL
  Filled 2021-05-18 (×4): qty 2

## 2021-05-18 MED ORDER — IPRATROPIUM-ALBUTEROL 0.5-2.5 (3) MG/3ML IN SOLN
3.0000 mL | RESPIRATORY_TRACT | Status: DC
  Administered 2021-05-18: 3 mL via RESPIRATORY_TRACT
  Filled 2021-05-18: qty 3

## 2021-05-18 MED ORDER — TRAMADOL HCL 50 MG PO TABS
50.0000 mg | ORAL_TABLET | Freq: Three times a day (TID) | ORAL | Status: DC | PRN
  Administered 2021-05-19 (×2): 50 mg via ORAL
  Filled 2021-05-18 (×2): qty 1

## 2021-05-18 MED ORDER — INSULIN ASPART 100 UNIT/ML IJ SOLN
0.0000 [IU] | Freq: Three times a day (TID) | INTRAMUSCULAR | Status: DC
  Administered 2021-05-19 – 2021-05-20 (×3): 4 [IU] via SUBCUTANEOUS
  Filled 2021-05-18 (×3): qty 1

## 2021-05-18 MED ORDER — ONDANSETRON HCL 4 MG/2ML IJ SOLN: 4.0000 mg | Freq: Three times a day (TID) | INTRAMUSCULAR | Status: DC | PRN

## 2021-05-18 MED ORDER — INSULIN ASPART 100 UNIT/ML IJ SOLN
0.0000 [IU] | Freq: Three times a day (TID) | INTRAMUSCULAR | Status: DC
  Administered 2021-05-18: 2 [IU] via SUBCUTANEOUS
  Filled 2021-05-18: qty 1

## 2021-05-18 MED ORDER — ALBUTEROL SULFATE (2.5 MG/3ML) 0.083% IN NEBU: 2.5000 mg | INHALATION_SOLUTION | RESPIRATORY_TRACT | Status: DC | PRN

## 2021-05-18 MED ORDER — IPRATROPIUM-ALBUTEROL 0.5-2.5 (3) MG/3ML IN SOLN
3.0000 mL | Freq: Once | RESPIRATORY_TRACT | Status: AC
  Administered 2021-05-18: 3 mL via RESPIRATORY_TRACT
  Filled 2021-05-18: qty 3

## 2021-05-18 MED ORDER — HEPARIN (PORCINE) 25000 UT/250ML-% IV SOLN
1500.0000 [IU]/h | INTRAVENOUS | Status: DC
  Administered 2021-05-18: 1200 [IU]/h via INTRAVENOUS
  Administered 2021-05-19: 1500 [IU]/h via INTRAVENOUS
  Filled 2021-05-18 (×2): qty 250

## 2021-05-18 MED ORDER — MORPHINE SULFATE (PF) 4 MG/ML IV SOLN
4.0000 mg | INTRAVENOUS | Status: DC | PRN
  Administered 2021-05-18: 4 mg via INTRAVENOUS
  Filled 2021-05-18: qty 1

## 2021-05-18 MED ORDER — NITROGLYCERIN 0.4 MG SL SUBL
0.4000 mg | SUBLINGUAL_TABLET | SUBLINGUAL | Status: DC | PRN
Start: 2021-05-18 — End: 2021-05-20

## 2021-05-18 MED ORDER — IOHEXOL 350 MG/ML SOLN
75.0000 mL | Freq: Once | INTRAVENOUS | Status: AC | PRN
  Administered 2021-05-18: 75 mL via INTRAVENOUS

## 2021-05-18 MED ORDER — ACETAMINOPHEN 325 MG PO TABS: 650.0000 mg | ORAL_TABLET | Freq: Four times a day (QID) | ORAL | Status: DC | PRN

## 2021-05-18 MED ORDER — MORPHINE SULFATE (PF) 2 MG/ML IV SOLN
2.0000 mg | INTRAVENOUS | Status: DC | PRN
  Administered 2021-05-18 – 2021-05-19 (×6): 2 mg via INTRAVENOUS
  Filled 2021-05-18 (×6): qty 1

## 2021-05-18 MED ORDER — NICOTINE 21 MG/24HR TD PT24
21.0000 mg | MEDICATED_PATCH | Freq: Every day | TRANSDERMAL | Status: DC
  Administered 2021-05-19 – 2021-05-20 (×2): 21 mg via TRANSDERMAL
  Filled 2021-05-18 (×2): qty 1

## 2021-05-18 MED ORDER — LISINOPRIL 20 MG PO TABS
20.0000 mg | ORAL_TABLET | Freq: Every day | ORAL | Status: DC
  Administered 2021-05-18 – 2021-05-20 (×3): 20 mg via ORAL
  Filled 2021-05-18 (×3): qty 1

## 2021-05-18 MED ORDER — LISINOPRIL-HYDROCHLOROTHIAZIDE 20-12.5 MG PO TABS: 1.0000 | ORAL_TABLET | Freq: Every day | ORAL | Status: DC

## 2021-05-18 MED ORDER — METHYLPREDNISOLONE SODIUM SUCC 125 MG IJ SOLR
90.0000 mg | Freq: Once | INTRAMUSCULAR | Status: AC
  Administered 2021-05-18: 90 mg via INTRAVENOUS
  Filled 2021-05-18: qty 2

## 2021-05-18 MED ORDER — HYDRALAZINE HCL 20 MG/ML IJ SOLN: 5.0000 mg | INTRAMUSCULAR | Status: DC | PRN

## 2021-05-18 MED ORDER — INSULIN ASPART 100 UNIT/ML IJ SOLN
0.0000 [IU] | Freq: Every day | INTRAMUSCULAR | Status: DC
Start: 2021-05-18 — End: 2021-05-18

## 2021-05-18 NOTE — H&P (Signed)
History and Physical    Reynold Mantell YQM:[id number] DOB: 02/10/1971 DOA: 05/18/2021  Referring MD/NP/PA:   PCP: Olin Hauser, DO   Patient coming from:  The patient is coming from home.  At baseline, pt is independent for most of ADL.        Chief Complaint: chest pain and SOB  HPI: Michoel Kunin is a 50 y.o. male with medical history significant of hypertension, hyperlipidemia, diabetes mellitus, stroke, CAD, myocardial infarction, anxiety, cardiac arrest, alcohol abuse, amphetamine abuse, rheumatoid arthritis, tobacco abuse, who presents with chest pain and shortness of breath.    Pt has chest pain and shortness of breath for several days.  He was seen in ED yesterday, and had negative trop.  Patient continues to have chest pain and shortness of breath.  Chest pain is located in central chest, which is intermittent, pressure-like, 6 out of 10 in severity, radiating to the right neck.  Patient has cough with clear mucus production, no fever or chills.  Patient states that he had nausea and vomited once, currently has dry heaves.  No diarrhea or abdominal pain.  Patient complains of a tingling in bilateral feet and hands.  No symptoms of UTI.   ED Course: pt was found to have troponin level 16, WBC 11.4, negative COVID PCR yesterday, renal function okay, temperature normal, blood pressure 139/95, heart rate 99, RR 18, oxygen saturation 94% on room air.  Chest x-ray negative.  CT angiogram is negative for PE patient is placed on progressive bed for observation.  Review of Systems:   General: no fevers, chills, no body weight gain, has fatigue HEENT: no blurry vision, hearing changes or sore throat Respiratory: has dyspnea, coughing, no wheezing CV: has chest pain, no palpitations GI: has nausea, vomiting, no abdominal pain, diarrhea, constipation GU: no dysuria, burning on urination, increased urinary frequency, hematuria  Ext: no leg edema Neuro: no unilateral weakness,  numbness, or tingling, no vision change or hearing loss Skin: no rash, no skin tear. MSK: No muscle spasm, no deformity, no limitation of range of movement in spin Heme: No easy bruising.  Travel history: No recent long distant travel.  Allergy:  Allergies  Allergen Reactions   Leflunomide Nausea And Vomiting   Methotrexate Derivatives Nausea And Vomiting    Have RA but can not tolerate any  Of the med  No components found with this name: [id number] saw Rheumatology in 2005   Other Nausea And Vomiting    Have RA but can not tolerate any  Of the med  No components found with this name: [id number] saw Rheumatology in 2005    Past Medical History:  Diagnosis Date   Anxiety    Arthritis    Cardiac arrest [facility]) 2008   Coronary artery disease    Heart attack ([place])    Hyperlipidemia    Hypertension    MI (myocardial infarction) ([place])    Stroke [facility])     Past Surgical History:  Procedure Laterality Date   CAROTID STENT  2008   EXPLORATION POST OPERATIVE OPEN HEART  2008   HERNIA REPAIR  2008   inguinal    Social History:  reports that he has been smoking cigarettes. He has a 8.00 pack-year smoking history. He has never used smokeless tobacco. He reports current alcohol use of about 2.0 standard drinks per week. He reports that he does not currently use drugs.  Family History:  Family History  Problem Relation Age of Onset   Cancer  Mother    Hyperlipidemia Father    Hypertension Father    Heart attack Father 45   Diabetes Father    Hypertension Brother      Prior to Admission medications   Medication Sig Start Date End Date Taking? Authorizing Provider  Blood Glucose Monitoring Suppl (ONETOUCH VERIO) w/Device KIT Use to check blood sugar up to twice a day. 01/12/20   Karamalegos, Devonne Doughty, DO  carvedilol (COREG) 6.25 MG tablet TAKE 1 TABLET BY MOUTH 2 TIMES DAILY WITH A MEAL. 05/02/21   Karamalegos, Devonne Doughty, DO  clopidogrel (PLAVIX) 75 MG tablet Take 1 tablet (75 mg  total) by mouth daily. 02/06/21   Karamalegos, Devonne Doughty, DO  gabapentin (NEURONTIN) 800 MG tablet Take 1 tablet (800 mg total) by mouth 4 (four) times daily. 02/06/21   Karamalegos, Devonne Doughty, DO  Lancets [facility] ULTRASOFT) lancets Use to check blood sugar up to twice a day. 01/12/20   Karamalegos, Devonne Doughty, DO  lisinopril-hydrochlorothiazide (ZESTORETIC) 20-12.5 MG tablet Take 1 tablet by mouth daily. 03/20/21   Parks Ranger, Devonne Doughty, DO  metFORMIN (GLUCOPHAGE) 1000 MG tablet Take 1 tablet (1,000 mg total) by mouth 2 (two) times daily with a meal. 02/06/21   Karamalegos, Devonne Doughty, DO  ONETOUCH VERIO test strip Use to check blood sugar up to twice a day. 01/12/20   Karamalegos, Devonne Doughty, DO  rosuvastatin (CRESTOR) 10 MG tablet Take 1 tablet (10 mg total) by mouth at bedtime. 02/06/21   Karamalegos, Devonne Doughty, DO  traMADol (ULTRAM) 50 MG tablet Take 1 tablet (50 mg total) by mouth every 8 (eight) hours as needed. 03/20/21   Olin Hauser, DO    Physical Exam: Vitals:   05/18/21 1356 05/18/21 1359 05/18/21 1430  BP:  (!) 156/99 (!) 139/95  Pulse:  99 90  Resp:  18 10  Temp:  98.6 F (37 C)   TempSrc:  Oral   SpO2:  97% 94%  Weight: 97 kg    Height: 6' (1.829 m)     General: Not in acute distress HEENT:       Eyes: PERRL, EOMI, no scleral icterus.       ENT: No discharge from the ears and nose, no pharynx injection, no tonsillar enlargement.        Neck: No JVD, no bruit, no mass felt. Heme: No neck lymph node enlargement. Cardiac: S1/S2, RRR, No murmurs, No gallops or rubs. Respiratory: No rales, wheezing, rhonchi or rubs. GI: Soft, nondistended, nontender, no rebound pain, no organomegaly, BS present. GU: No hematuria Ext: No pitting leg edema bilaterally. 1+DP/PT pulse bilaterally. Musculoskeletal: No joint deformities, No joint redness or warmth, no limitation of ROM in spin. Skin: No rashes.  Neuro: Alert, oriented X3, cranial nerves II-XII grossly  intact, moves all extremities normally.  Psych: Patient is not psychotic, no suicidal or hemocidal ideation.  Labs on Admission: I have personally reviewed following labs and imaging studies  CBC: Recent Labs  Lab 05/17/21 1052 05/18/21 1421  WBC 7.2 11.4*  HGB 15.1 15.2  HCT 40.8 40.7  MCV 97.1 98.1  PLT 272 921   Basic Metabolic Panel: Recent Labs  Lab 05/17/21 1052 05/18/21 1421  NA 128* 132*  K 3.6 3.7  CL 89* 92*  CO2 26 30  GLUCOSE 261* 180*  BUN 12 18  CREATININE 0.80 0.90  CALCIUM 8.9 9.7   GFR: Estimated Creatinine Clearance: 119.9 mL/min (by C-G formula based on SCr of 0.9 mg/dL). Liver Function Tests:  No results for input(s): AST, ALT, ALKPHOS, BILITOT, PROT, ALBUMIN in the last 168 hours. No results for input(s): LIPASE, AMYLASE in the last 168 hours. No results for input(s): AMMONIA in the last 168 hours. Coagulation Profile: No results for input(s): INR, PROTIME in the last 168 hours. Cardiac Enzymes: No results for input(s): CKTOTAL, CKMB, CKMBINDEX, TROPONINI in the last 168 hours. BNP (last 3 results) No results for input(s): PROBNP in the last 8760 hours. HbA1C: No results for input(s): HGBA1C in the last 72 hours. CBG: No results for input(s): GLUCAP in the last 168 hours. Lipid Profile: No results for input(s): CHOL, HDL, LDLCALC, TRIG, CHOLHDL, LDLDIRECT in the last 72 hours. Thyroid Function Tests: No results for input(s): TSH, T4TOTAL, FREET4, T3FREE, THYROIDAB in the last 72 hours. Anemia Panel: No results for input(s): VITAMINB12, FOLATE, FERRITIN, TIBC, IRON, RETICCTPCT in the last 72 hours. Urine analysis: No results found for: COLORURINE, APPEARANCEUR, LABSPEC, [place], GLUCOSEU, HGBUR, BILIRUBINUR, KETONESUR, PROTEINUR, UROBILINOGEN, NITRITE, LEUKOCYTESUR Sepsis Labs: _0 (procalcitonin:4,lacticidven:4) ) Recent Results (from the past 240 hour(s))  Resp Panel by RT-PCR (Flu A&B, Covid) Nasopharyngeal Swab     Status: None    Collection Time: 05/17/21  3:10 PM   Specimen: Nasopharyngeal Swab; Nasopharyngeal(NP) swabs in vial transport medium  Result Value Ref Range Status   SARS Coronavirus 2 by RT PCR NEGATIVE NEGATIVE Final    Comment: (NOTE) SARS-CoV-2 target nucleic acids are NOT DETECTED.  The SARS-CoV-2 RNA is generally detectable in upper respiratory specimens during the acute phase of infection. The lowest concentration of SARS-CoV-2 viral copies this assay can detect is 138 copies/mL. A negative result does not preclude SARS-Cov-2 infection and should not be used as the sole basis for treatment or other patient management decisions. A negative result may occur with  improper specimen collection/handling, submission of specimen other than nasopharyngeal swab, presence of viral mutation(s) within the areas targeted by this assay, and inadequate number of viral copies(<138 copies/mL). A negative result must be combined with clinical observations, patient history, and epidemiological information. The expected result is Negative.  Fact Sheet for Patients:  EntrepreneurPulse.com.au  Fact Sheet for Healthcare Providers:  IncredibleEmployment.be  This test is no t yet approved or cleared by the [place] FDA and  has been authorized for detection and/or diagnosis of SARS-CoV-2 by FDA under an Emergency Use Authorization (EUA). This EUA will remain  in effect (meaning this test can be used) for the duration of the COVID-19 declaration under Section 564(b)(1) of the Act, 21 U.S.C.section 360bbb-3(b)(1), unless the authorization is terminated  or revoked sooner.       Influenza A by PCR NEGATIVE NEGATIVE Final   Influenza B by PCR NEGATIVE NEGATIVE Final    Comment: (NOTE) The Xpert Xpress SARS-CoV-2/FLU/RSV plus assay is intended as an aid in the diagnosis of influenza from Nasopharyngeal swab specimens and should not be used as a sole basis for treatment.  Nasal washings and aspirates are unacceptable for Xpert Xpress SARS-CoV-2/FLU/RSV testing.  Fact Sheet for Patients: EntrepreneurPulse.com.au  Fact Sheet for Healthcare Providers: IncredibleEmployment.be  This test is not yet approved or cleared by the [place] FDA and has been authorized for detection and/or diagnosis of SARS-CoV-2 by FDA under an Emergency Use Authorization (EUA). This EUA will remain in effect (meaning this test can be used) for the duration of the COVID-19 declaration under Section 564(b)(1) of the Act, 21 U.S.C. section 360bbb-3(b)(1), unless the authorization is terminated or revoked.  Performed at [facility], [place]  [address], [place], [place] [address]      Radiological Exams on Admission: DG Chest 2 View  Result Date: 05/18/2021 CLINICAL DATA:  Shortness of breath.  Chest pain. EXAM: CHEST - 2 VIEW COMPARISON:  Yesterday FINDINGS: Prior median sternotomy. Mild right hemidiaphragm elevation. Remote right rib fractures. Midline trachea. Normal heart size. No pleural effusion or pneumothorax. Mild volume loss at the right lung base. IMPRESSION: No acute cardiopulmonary disease. Electronically Signed   By: Abigail Miyamoto M.D.   On: 05/18/2021 14:56   DG Chest 2 View  Result Date: 05/17/2021 CLINICAL DATA:  Shortness breath. EXAM: CHEST - 2 VIEW COMPARISON:  Two-view chest x-ray 05/01/2021 FINDINGS: Heart size is normal. Median sternotomy noted. Lungs are clear. Changes of COPD again noted. Remote right-sided rib fractures are present. IMPRESSION: No acute cardiopulmonary disease or significant interval change. Electronically Signed   By: San Morelle M.D.   On: 05/17/2021 11:35   CT ANGIO CHEST AORTA W/CM & OR WO/CM  Result Date: 05/18/2021 CLINICAL DATA:  Chest pain and shortness of breath. EXAM: CT ANGIOGRAPHY CHEST WITH CONTRAST TECHNIQUE: Multidetector CT imaging of the chest was performed using the  standard protocol during bolus administration of intravenous contrast. Multiplanar CT image reconstructions and MIPs were obtained to evaluate the vascular anatomy. CONTRAST:  75m OMNIPAQUE IOHEXOL 350 MG/ML SOLN COMPARISON:  None. FINDINGS: Cardiovascular: The ascending thoracic aorta measures approximately 4.2 cm in diameter. Satisfactory opacification of the pulmonary arteries to the segmental level. No evidence of pulmonary embolism. Normal heart size with mild coronary artery calcification. No pericardial effusion. Mediastinum/Nodes: No enlarged mediastinal, hilar, or axillary lymph nodes. Thyroid gland, trachea, and esophagus demonstrate no significant findings. Lungs/Pleura: Very mild linear atelectasis and/or scarring is seen within the right lung base. There is no evidence of acute infiltrate, pleural effusion, pneumothorax. Upper Abdomen: There is mild diffuse right adrenal gland enlargement. Diffuse enlargement of the left adrenal gland is also seen. A 3.3 cm x 2.7 cm low-attenuation left adrenal mass is present (approximately 27.46 Hounsfield units). Musculoskeletal: Multiple sternal wires are present. Chronic second through eighth lateral right rib fractures are seen. Review of the MIP images confirms the above findings. IMPRESSION: 1. No evidence of pulmonary embolism or acute cardiopulmonary disease. 2. Low-attenuation left adrenal mass which may represent an adrenal adenoma. Correlation with adrenal protocol CT is recommended. 3. Chronic second through eighth lateral right rib fractures. Electronically Signed   By: TVirgina NorfolkM.D.   On: 05/18/2021 15:46     EKG: I have personally reviewed.  Sinus rhythm, QTC 462, LAE, early R wave progression, T wave inversion in inferior leads and V3-V6.  Assessment/Plan Principal Problem:   Chest pain Active Problems:   Essential hypertension   Hyperlipidemia associated with type 2 diabetes mellitus ([place]   History of cerebrovascular accident  (CVA) with residual deficit   Type 2 diabetes mellitus with vascular disease (HCC)   Alcohol dependence ([place]   CAD (coronary artery disease)   Tobacco abuse   Chest pain and hx of CAD: Patient chest pain has been going on for 3 days, radiating to the right neck.  Patient has a new T wave inversion in the inferior leads and V3-V6, clinically consistent with possible unstable angina.  Dr. GRockey Situfor cardiology is consulted.  Stress test is scheduled for tomorrow  - place to progressive unit for observation - IV heparin - Trend Trop - Repeat EKG in the am  - prn Nitroglycerin, Morphine - Pt is on Crestor -  Risk factor stratification: will check FLP and A1C  - check UDS  Essential hypertension -IV hydralazine as needed -Coreg, Zestoretic  Hyperlipidemia associated with type 2 diabetes mellitus (HCC) -Crestor  History of cerebrovascular accident (CVA) with residual deficit -Plavix and Crestor  Type 2 diabetes mellitus with vascular disease ([address]): Recent A1c 6.6, well controlled.  Patient is taking metformin -Sliding scale insulin  Tobacco abuse and Alcohol abuse: -Did counseling about importance of quitting smoking and alcohol use -Nicotine patch -CIWA protocol  DVT ppx: onIV Heparin    Code Status: Full code Family Communication: I called his brother who did not pick up the phone, mailbox is full, could not leave message. Disposition Plan:  Anticipate discharge back to previous environment Consults called:  Dr. Rockey Situ of card Admission status and Level of care: Progressive Cardiac:    for obs    Status is: Observation  The patient remains OBS appropriate and will d/c before 2 midnights.  Dispo: The patient is from: Home              Anticipated d/c is to: Home              Patient currently is not medically stable to d/c.   Difficult to place patient No         Date of Service 05/18/2021    [facility]   If 7PM-7AM, please contact  night-coverage [email or web address] 05/18/2021, 4:55 PM

## 2021-05-18 NOTE — ED Provider Notes (Addendum)
[facility] Emergency Department Provider Note    Event Date/Time   First MD Initiated Contact with Patient 05/18/21 1408     (approximate)  I have reviewed the triage vital signs and the nursing notes.   HISTORY  Chief Complaint Shortness of Breath    HPI Tyler Martinez is a 50 y.o. male below listed past medical history presents to the ER for evaluation of worsening left-sided chest discomfort associated with nausea pain radiating to his neck associate with shortness of breath.  States he was feeling well when he left yesterday but had recurrent worsening symptoms yesterday evening.  Past Medical History:  Diagnosis Date   Anxiety    Arthritis    Cardiac arrest ([place]) 2008   Coronary artery disease    Heart attack ([place])    Hyperlipidemia    Hypertension    MI (myocardial infarction) ([place])    Stroke ([place])    Family History  Problem Relation Age of Onset   Cancer Mother    Hyperlipidemia Father    Hypertension Father    Heart attack Father 63   Diabetes Father    Hypertension Brother    Past Surgical History:  Procedure Laterality Date   CAROTID STENT  2008   EXPLORATION POST OPERATIVE OPEN HEART  2008   HERNIA REPAIR  2008   inguinal   Patient Active Problem List   Diagnosis Date Noted   History of substance abuse ([place]) 01/12/2020   Alcohol dependence ([place]) 01/12/2020   Coronary artery disease involving native coronary artery of native heart without angina pectoris 04/14/2019   Essential hypertension 04/14/2019   Hyperlipidemia associated with type 2 diabetes mellitus ([place]) 04/14/2019   History of cerebrovascular accident (CVA) with residual deficit 04/14/2019   Residual cognitive deficit as late effect of stroke 04/14/2019   Type 2 diabetes mellitus with vascular disease ([place]) 04/14/2019   Diabetic polyneuropathy associated with type 2 diabetes mellitus ([place]) 04/14/2019   Rheumatoid arthritis involving multiple sites ([place]) 04/14/2019    Flat foot 04/14/2019   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 Authorizing Provider  Blood Glucose Monitoring Suppl (ONETOUCH VERIO) w/Device KIT Use to check blood sugar up to twice a day. 01/12/20   Karamalegos, Devonne Doughty, DO  carvedilol (COREG) 6.25 MG tablet TAKE 1 TABLET BY MOUTH 2 TIMES DAILY WITH A MEAL. 05/02/21   Karamalegos, Devonne Doughty, DO  clopidogrel (PLAVIX) 75 MG tablet Take 1 tablet (75 mg total) by mouth daily. 02/06/21   Karamalegos, Devonne Doughty, DO  gabapentin (NEURONTIN) 800 MG tablet Take 1 tablet (800 mg total) by mouth 4 (four) times daily. 02/06/21   Karamalegos, Devonne Doughty, DO  Lancets [facility] ULTRASOFT) lancets Use to check blood sugar up to twice a day. 01/12/20   Karamalegos, Devonne Doughty, DO  lisinopril-hydrochlorothiazide (ZESTORETIC) 20-12.5 MG tablet Take 1 tablet by mouth daily. 03/20/21   Parks Ranger, Devonne Doughty, DO  metFORMIN (GLUCOPHAGE) 1000 MG tablet Take 1 tablet (1,000 mg total) by mouth 2 (two) times daily with a meal. 02/06/21   Karamalegos, Devonne Doughty, DO  ONETOUCH VERIO test strip Use to check blood sugar up to twice a day. 01/12/20   Karamalegos, Devonne Doughty, DO  rosuvastatin (CRESTOR) 10 MG tablet Take 1 tablet (10 mg total) by mouth at bedtime. 02/06/21   Karamalegos, Devonne Doughty, DO  traMADol (ULTRAM) 50 MG tablet Take 1 tablet (50 mg total)  by mouth every 8 (eight) hours as needed. 03/20/21   Karamalegos, Devonne Doughty, DO    Allergies Leflunomide, Methotrexate derivatives, and Other    Social History Social History   Tobacco Use   Smoking status: Every Day    Packs/day: 1.00    Years: 8.00    Pack years: 8.00    Types: Cigarettes   Smokeless tobacco: Never  Vaping Use   Vaping Use: Never used  Substance Use Topics   Alcohol use: Yes    Alcohol/week: 2.0 standard drinks    Types: 2 Standard drinks or equivalent per week    Comment: Drinks mixed drinks every  evening.   Drug use: Not Currently    Review of Systems Patient denies headaches, rhinorrhea, blurry vision, numbness, shortness of breath, chest pain, edema, cough, abdominal pain, nausea, vomiting, diarrhea, dysuria, fevers, rashes or hallucinations unless otherwise stated above in HPI. ____________________________________________   PHYSICAL EXAM:  VITAL SIGNS: Vitals:   05/18/21 1359 05/18/21 1430  BP: (!) 156/99 (!) 139/95  Pulse: 99 90  Resp: 18 10  Temp: 98.6 F (37 C)   SpO2: 97% 94%    Constitutional: Alert and oriented.  Eyes: Conjunctivae are normal.  Head: Atraumatic. Nose: No congestion/rhinnorhea. Mouth/Throat: Mucous membranes are moist.   Neck: No stridor. Painless ROM.  Cardiovascular: Normal rate, regular rhythm. Grossly normal heart sounds.  Good peripheral circulation. Respiratory: Normal respiratory effort.  No retractions. Lungs with scattered wheeze throughout Gastrointestinal: Soft and nontender. No distention. No abdominal bruits. No CVA tenderness. Genitourinary:  Musculoskeletal: No lower extremity tenderness nor edema.  No joint effusions. Neurologic:  Normal speech and language. No gross focal neurologic deficits are appreciated. No facial droop Skin:  Skin is warm, dry and intact. No rash noted. Psychiatric: Mood and affect are normal. Speech and behavior are normal.  ____________________________________________   LABS (all labs ordered are listed, but only abnormal results are displayed)  Results for orders placed or performed during the hospital encounter of 05/18/21 (from the past 24 hour(s))  Basic metabolic panel     Status: Abnormal   Collection Time: 05/18/21  2:21 PM  Result Value Ref Range   Sodium 132 (L) 135 - 145 mmol/L   Potassium 3.7 3.5 - 5.1 mmol/L   Chloride 92 (L) 98 - 111 mmol/L   CO2 30 22 - 32 mmol/L   Glucose, Bld 180 (H) 70 - 99 mg/dL   BUN 18 6 - 20 mg/dL   Creatinine, Ser 0.90 0.61 - 1.24 mg/dL   Calcium 9.7  8.9 - 10.3 mg/dL   GFR, Estimated >60 >60 mL/min   Anion gap 10 5 - 15  CBC     Status: Abnormal   Collection Time: 05/18/21  2:21 PM  Result Value Ref Range   WBC 11.4 (H) 4.0 - 10.5 K/uL   RBC 4.15 (L) 4.22 - 5.81 MIL/uL   Hemoglobin 15.2 13.0 - 17.0 g/dL   HCT 40.7 39.0 - 52.0 %   MCV 98.1 80.0 - 100.0 fL   MCH 36.6 (H) 26.0 - 34.0 pg   MCHC 37.3 (H) 30.0 - 36.0 g/dL   RDW 13.7 11.5 - 15.5 %   Platelets 287 150 - 400 K/uL   nRBC 0.0 0.0 - 0.2 %  Troponin I (High Sensitivity)     Status: None   Collection Time: 05/18/21  2:21 PM  Result Value Ref Range   Troponin I (High Sensitivity) 16 <18 ng/L   ____________________________________________  EKG My  review and personal interpretation at Time: 13:56   Indication: chest pain  Rate: 95  Rhythm: sinus Axis: normal Other: inferolateral twave abn that is new from yesterday ____________________________________________  RADIOLOGY  I personally reviewed all radiographic images ordered to evaluate for the above acute complaints and reviewed radiology reports and findings.  These findings were personally discussed with the patient.  Please see medical record for radiology report.  ____________________________________________   PROCEDURES  Procedure(s) performed:  .Critical Care Performed by: Merlyn Lot, MD Authorized by: Merlyn Lot, MD   Critical care provider statement:    Critical care time (minutes):  10   Critical care time was exclusive of:  Separately billable procedures and treating other patients   Critical care was necessary to treat or prevent imminent or life-threatening deterioration of the following conditions:  Cardiac failure   Critical care was time spent personally by me on the following activities:  Development of treatment plan with patient or surrogate, discussions with consultants, evaluation of patient's response to treatment, examination of patient, obtaining history from patient or  surrogate, ordering and performing treatments and interventions, ordering and review of laboratory studies, ordering and review of radiographic studies, pulse oximetry, re-evaluation of patient's condition and review of old charts    Critical Care performed: yes ____________________________________________   INITIAL IMPRESSION / ASSESSMENT AND PLAN / ED COURSE  Pertinent labs & imaging results that were available during my care of the patient were reviewed by me and considered in my medical decision making (see chart for details).   DDX: ACS, pericarditis, esophagitis, boerhaaves, pe, dissection, pna, bronchitis, costochondritis   Aloys Hupfer is a 50 y.o. who presents to the ED with recurrent chest pain discomfort as described above.  Patient does have evidence of new T wave inversions as compared to previous.  Troponin remains nonischemic but presentation concerning for unstable angina.  CTA was ordered as he is having pain radiating to his his shoulder blades and in his neck describing pressure is no sign of dissection.  Have lower suspicion for PE.  Does have wheezing on exam may be bronchitis but based on his EKG changes persistent pain with concern for unstable anginal will discussed with cardiology as well as hospitalist for admission.     The patient was evaluated in Emergency Department today for the symptoms described in the history of present illness. He/she was evaluated in the context of the global COVID-19 pandemic, which necessitated consideration that the patient might be at risk for infection with the SARS-CoV-2 virus that causes COVID-19. Institutional protocols and algorithms that pertain to the evaluation of patients at risk for COVID-19 are in a state of rapid change based on information released by regulatory bodies including the CDC and federal and state organizations. These policies and algorithms were followed during the patient's care in the ED.  As part of my medical  decision making, I reviewed the following data within the [facility] notes reviewed and incorporated, Labs reviewed, notes from prior ED visits and [place] Controlled Substance Database   ____________________________________________   FINAL CLINICAL IMPRESSION(S) / ED DIAGNOSES  Final diagnoses:  Chest pain, unspecified type      NEW MEDICATIONS STARTED DURING THIS VISIT:  New Prescriptions   No medications on file     Note:  This document was prepared using Dragon voice recognition software and may include unintentional dictation errors.    Merlyn Lot, MD 05/18/21 1601    Merlyn Lot, MD 05/18/21 [phone]

## 2021-05-18 NOTE — Consult Note (Signed)
ANTICOAGULATION CONSULT NOTE - Initial Consult  Pharmacy Consult for Heparin infusion Indication: chest pain/ACS  Allergies  Allergen Reactions   Leflunomide Nausea And Vomiting   Methotrexate Derivatives Nausea And Vomiting    Have RA but can not tolerate any  Of the med  No components found with this name: [id number] saw Rheumatology in 2005   Other Nausea And Vomiting    Have RA but can not tolerate any  Of the med  No components found with this name: [id number] saw Rheumatology in 2005    Patient Measurements: Height: 6' (182.9 cm) Weight: 97 kg (213 lb 13.5 oz) IBW/kg (Calculated) : 77.6 Heparin Dosing Weight: 97kg  Vital Signs: Temp: 98.6 F (37 C) (09/22 1359) Temp Source: Oral (09/22 1359) BP: 139/95 (09/22 1430) Pulse Rate: 90 (09/22 1430)  Labs: Recent Labs    05/17/21 1052 05/17/21 1252 05/18/21 1421 05/18/21 1624  HGB 15.1  --  15.2  --   HCT 40.8  --  40.7  --   PLT 272  --  287  --   CREATININE 0.80  --  0.90  --   TROPONINIHS 13 12 16 17     Estimated Creatinine Clearance: 119.9 mL/min (by C-G formula based on SCr of 0.9 mg/dL).   Medical History: Past Medical History:  Diagnosis Date   Anxiety    Arthritis    Cardiac arrest ([place]) 2008   Coronary artery disease    Heart attack ([place])    Hyperlipidemia    Hypertension    MI (myocardial infarction) ([place])    Stroke ([facility])     Medications:  Scheduled:   insulin aspart  0-5 Units Subcutaneous QHS   insulin aspart  0-9 Units Subcutaneous TID WC   ipratropium-albuterol  3 mL Nebulization Q4H   nicotine  21 mg Transdermal Daily    Assessment: Patient admitted to hospital with shortness of breath, PMH relevant for anxiety, cardiac arrest, MI, HTN, HDL, and stroke.  Goal of Therapy:  Heparin level 0.3-0.7 units/ml Monitor platelets by anticoagulation protocol: Yes   Plan:  Give 4000 units bolus x 1 Start heparin infusion at 1200 units/hr Check anti-Xa level in 6 hours and daily while on  heparin Continue to monitor H&H and platelets  Amalee Olsen Rodriguez-Guzman PharmD, BCPS 05/18/2021 5:17 PM

## 2021-05-18 NOTE — Progress Notes (Signed)
Pt blood glucose 461. Pt states that he has noticed that he has been going to the bathroom a lot today. Provider Sharion Settler notified. See orders. Will continue to monitor, call bell within reach.

## 2021-05-18 NOTE — ED Notes (Signed)
NM called and stated pt needed to be NPO after midnight for possible stress test tomorrow

## 2021-05-18 NOTE — ED Notes (Signed)
Pt taken for CT 

## 2021-05-18 NOTE — ED Notes (Addendum)
MD stating EKG changes. Pt taken to x-ray and will be brought to room 4. Primary RN made aware

## 2021-05-18 NOTE — ED Notes (Signed)
This RN attempted IV access twice without success

## 2021-05-18 NOTE — ED Triage Notes (Signed)
Pt to ED via POV from home. Pt c/o SOB, CP, right sided neck pain and head pressure. Pt stating SOB and pain is still getting worse. Pt states he's scheduled for echo tomorrow. Pt seen yesterday for the same

## 2021-05-19 ENCOUNTER — Encounter: Payer: Self-pay | Admitting: Internal Medicine

## 2021-05-19 ENCOUNTER — Other Ambulatory Visit: Payer: Medicare Other

## 2021-05-19 ENCOUNTER — Observation Stay (HOSPITAL_BASED_OUTPATIENT_CLINIC_OR_DEPARTMENT_OTHER)
Admit: 2021-05-19 | Discharge: 2021-05-19 | Disposition: A | Discharge status: Home / Self Care | Payer: Medicare Other | Attending: Medical | Admitting: Medical

## 2021-05-19 ENCOUNTER — Observation Stay: Payer: Medicare Other

## 2021-05-19 ENCOUNTER — Observation Stay (HOSPITAL_BASED_OUTPATIENT_CLINIC_OR_DEPARTMENT_OTHER): Payer: Medicare Other

## 2021-05-19 ENCOUNTER — Encounter: Admission: RE | Admit: 2021-05-19 | Payer: Medicare Other | Source: Ambulatory Visit

## 2021-05-19 ENCOUNTER — Encounter
Admission: EM | Disposition: A | Discharge status: Home / Self Care | Payer: Self-pay | Source: Home / Self Care | Attending: Student in an Organized Health Care Education/Training Program

## 2021-05-19 DIAGNOSIS — Z8673 Personal history of transient ischemic attack (TIA), and cerebral infarction without residual deficits: Secondary | ICD-10-CM | POA: Diagnosis not present

## 2021-05-19 DIAGNOSIS — R9439 Abnormal result of other cardiovascular function study: Secondary | ICD-10-CM | POA: Diagnosis not present

## 2021-05-19 DIAGNOSIS — R079 Chest pain, unspecified: Secondary | ICD-10-CM

## 2021-05-19 DIAGNOSIS — I771 Stricture of artery: Secondary | ICD-10-CM | POA: Diagnosis not present

## 2021-05-19 DIAGNOSIS — I200 Unstable angina: Secondary | ICD-10-CM | POA: Diagnosis not present

## 2021-05-19 DIAGNOSIS — I77810 Thoracic aortic ectasia: Secondary | ICD-10-CM

## 2021-05-19 DIAGNOSIS — E785 Hyperlipidemia, unspecified: Secondary | ICD-10-CM

## 2021-05-19 DIAGNOSIS — E118 Type 2 diabetes mellitus with unspecified complications: Secondary | ICD-10-CM | POA: Diagnosis not present

## 2021-05-19 DIAGNOSIS — I10 Essential (primary) hypertension: Secondary | ICD-10-CM

## 2021-05-19 DIAGNOSIS — I25110 Atherosclerotic heart disease of native coronary artery with unstable angina pectoris: Secondary | ICD-10-CM | POA: Diagnosis not present

## 2021-05-19 DIAGNOSIS — I6523 Occlusion and stenosis of bilateral carotid arteries: Secondary | ICD-10-CM | POA: Diagnosis not present

## 2021-05-19 DIAGNOSIS — Z720 Tobacco use: Secondary | ICD-10-CM | POA: Diagnosis not present

## 2021-05-19 HISTORY — PX: LEFT HEART CATH AND CORONARY ANGIOGRAPHY: CATH118249

## 2021-05-19 HISTORY — DX: Thoracic aortic ectasia: I77.810

## 2021-05-19 LAB — CBC
HCT: 41.3 % (ref 39.0–52.0)
Hemoglobin: 14.8 g/dL (ref 13.0–17.0)
MCH: 35.7 pg — ABNORMAL HIGH (ref 26.0–34.0)
MCHC: 35.8 g/dL (ref 30.0–36.0)
MCV: 99.8 fL (ref 80.0–100.0)
Platelets: 289 10*3/uL (ref 150–400)
RBC: 4.14 MIL/uL — ABNORMAL LOW (ref 4.22–5.81)
RDW: 13.7 % (ref 11.5–15.5)
WBC: 7.7 10*3/uL (ref 4.0–10.5)
nRBC: 0 % (ref 0.0–0.2)

## 2021-05-19 LAB — GLUCOSE, CAPILLARY
Glucose-Capillary: 386 mg/dL — ABNORMAL HIGH (ref 70–99)
Glucose-Capillary: 195 mg/dL — ABNORMAL HIGH (ref 70–99)
Glucose-Capillary: 167 mg/dL — ABNORMAL HIGH (ref 70–99)
Glucose-Capillary: 219 mg/dL — ABNORMAL HIGH (ref 70–99)
Glucose-Capillary: 163 mg/dL — ABNORMAL HIGH (ref 70–99)
Glucose-Capillary: 155 mg/dL — ABNORMAL HIGH (ref 70–99)

## 2021-05-19 LAB — NM MYOCAR MULTI W/SPECT W/WALL MOTION / EF
LV dias vol: 111 mL (ref 62–150)
LV sys vol: 44 mL
Nuc Stress EF: 60 %
Rest Nuclear Isotope Dose: 9.7 mCi
SDS: 5
SRS: 3
SSS: 8
ST Depression (mm): 0.5 mm
Stress Nuclear Isotope Dose: 31.1 mCi
TID: 1

## 2021-05-19 LAB — ECHOCARDIOGRAM COMPLETE
AR max vel: 3.22 cm2
AV Area VTI: 3.48 cm2
AV Area mean vel: 2.85 cm2
AV Mean grad: 5.3 mmHg
AV Peak grad: 8.4 mmHg
Ao pk vel: 1.45 m/s
Area-P 1/2: 3.27 cm2
Height: 72 in
S' Lateral: 2.6 cm
Weight: 3421.54 oz

## 2021-05-19 LAB — LIPID PANEL
Cholesterol: 244 mg/dL — ABNORMAL HIGH (ref 0–200)
HDL: 106 mg/dL (ref >40)
LDL Cholesterol: 116 mg/dL — ABNORMAL HIGH (ref 0–99)
Total CHOL/HDL Ratio: 2.3 RATIO
Triglycerides: 112 mg/dL (ref <150)
VLDL: 22 mg/dL (ref 0–40)

## 2021-05-19 LAB — HIV ANTIBODY (ROUTINE TESTING W REFLEX): HIV Screen 4th Generation wRfx: NONREACTIVE

## 2021-05-19 LAB — HEMOGLOBIN A1C
Hgb A1c MFr Bld: 6.8 % — ABNORMAL HIGH (ref 4.8–5.6)
Mean Plasma Glucose: 148 mg/dL

## 2021-05-19 LAB — HEPARIN LEVEL (UNFRACTIONATED): Heparin Unfractionated: <0.1 IU/mL — ABNORMAL LOW (ref 0.30–0.70)

## 2021-05-19 SURGERY — LEFT HEART CATH AND CORONARY ANGIOGRAPHY
Anesthesia: Moderate Sedation

## 2021-05-19 MED ORDER — LORAZEPAM 2 MG/ML IJ SOLN
0.0000 mg | Freq: Four times a day (QID) | INTRAMUSCULAR | Status: DC
Start: 2021-05-19 — End: 2021-05-20

## 2021-05-19 MED ORDER — MORPHINE SULFATE (PF) 2 MG/ML IV SOLN
INTRAVENOUS | Status: AC
  Administered 2021-05-19: 2 mg via INTRAVENOUS
  Filled 2021-05-19: qty 1

## 2021-05-19 MED ORDER — SODIUM CHLORIDE 0.9 % IV SOLN: INTRAVENOUS | Status: DC

## 2021-05-19 MED ORDER — ASPIRIN 81 MG PO CHEW
CHEWABLE_TABLET | ORAL | Status: AC
  Administered 2021-05-19: 81 mg via ORAL
  Filled 2021-05-19: qty 1

## 2021-05-19 MED ORDER — VERAPAMIL HCL 2.5 MG/ML IV SOLN
INTRAVENOUS | Status: DC | PRN
  Administered 2021-05-19: 2.5 mg via INTRA_ARTERIAL

## 2021-05-19 MED ORDER — SODIUM CHLORIDE 0.9% FLUSH
3.0000 mL | Freq: Two times a day (BID) | INTRAVENOUS | Status: DC
  Administered 2021-05-19 – 2021-05-20 (×2): 3 mL via INTRAVENOUS

## 2021-05-19 MED ORDER — LORAZEPAM 2 MG/ML IJ SOLN: 1.0000 mg | INTRAMUSCULAR | Status: DC | PRN

## 2021-05-19 MED ORDER — ADULT MULTIVITAMIN W/MINERALS CH
1.0000 | ORAL_TABLET | Freq: Every day | ORAL | Status: DC
  Administered 2021-05-19 – 2021-05-20 (×2): 1 via ORAL
  Filled 2021-05-19 (×2): qty 1

## 2021-05-19 MED ORDER — ENOXAPARIN SODIUM 40 MG/0.4ML IJ SOSY
40.0000 mg | PREFILLED_SYRINGE | INTRAMUSCULAR | Status: DC
  Administered 2021-05-20: 40 mg via SUBCUTANEOUS
  Filled 2021-05-19: qty 0.4

## 2021-05-19 MED ORDER — VERAPAMIL HCL 2.5 MG/ML IV SOLN
INTRAVENOUS | Status: AC
  Filled 2021-05-19: qty 2

## 2021-05-19 MED ORDER — LORAZEPAM 1 MG PO TABS
1.0000 mg | ORAL_TABLET | ORAL | Status: DC | PRN
  Administered 2021-05-20 (×2): 1 mg via ORAL
  Filled 2021-05-19 (×2): qty 1

## 2021-05-19 MED ORDER — INFLUENZA VAC SPLIT QUAD 0.5 ML IM SUSY
0.5000 mL | PREFILLED_SYRINGE | INTRAMUSCULAR | Status: DC
  Filled 2021-05-19: qty 0.5

## 2021-05-19 MED ORDER — FOLIC ACID 1 MG PO TABS
1.0000 mg | ORAL_TABLET | Freq: Every day | ORAL | Status: DC
  Administered 2021-05-19 – 2021-05-20 (×2): 1 mg via ORAL
  Filled 2021-05-19 (×2): qty 1

## 2021-05-19 MED ORDER — THIAMINE HCL 100 MG PO TABS
100.0000 mg | ORAL_TABLET | Freq: Every day | ORAL | Status: DC
  Administered 2021-05-19 – 2021-05-20 (×2): 100 mg via ORAL
  Filled 2021-05-19 (×2): qty 1

## 2021-05-19 MED ORDER — FENTANYL CITRATE (PF) 100 MCG/2ML IJ SOLN
INTRAMUSCULAR | Status: AC
  Filled 2021-05-19: qty 2

## 2021-05-19 MED ORDER — HEPARIN (PORCINE) IN NACL 1000-0.9 UT/500ML-% IV SOLN
INTRAVENOUS | Status: AC
  Filled 2021-05-19: qty 1000

## 2021-05-19 MED ORDER — THIAMINE HCL 100 MG/ML IJ SOLN
100.0000 mg | Freq: Every day | INTRAMUSCULAR | Status: DC
  Filled 2021-05-19: qty 2

## 2021-05-19 MED ORDER — ROSUVASTATIN CALCIUM 20 MG PO TABS
40.0000 mg | ORAL_TABLET | Freq: Every day | ORAL | Status: DC
  Administered 2021-05-19: 40 mg via ORAL
  Filled 2021-05-19: qty 2
  Filled 2021-05-19: qty 4
  Filled 2021-05-19: qty 2

## 2021-05-19 MED ORDER — SODIUM CHLORIDE 0.9% FLUSH: 3.0000 mL | INTRAVENOUS | Status: DC | PRN

## 2021-05-19 MED ORDER — HEPARIN (PORCINE) IN NACL 1000-0.9 UT/500ML-% IV SOLN
INTRAVENOUS | Status: DC | PRN
  Administered 2021-05-19 (×2): 500 mL

## 2021-05-19 MED ORDER — TECHNETIUM TC 99M TETROFOSMIN IV KIT
10.0000 | PACK | Freq: Once | INTRAVENOUS | Status: AC | PRN
  Administered 2021-05-19: 9.76 via INTRAVENOUS

## 2021-05-19 MED ORDER — MIDAZOLAM HCL 2 MG/2ML IJ SOLN
INTRAMUSCULAR | Status: AC
  Filled 2021-05-19: qty 2

## 2021-05-19 MED ORDER — REGADENOSON 0.4 MG/5ML IV SOLN
0.4000 mg | Freq: Once | INTRAVENOUS | Status: AC
  Administered 2021-05-19: 0.4 mg via INTRAVENOUS

## 2021-05-19 MED ORDER — SODIUM CHLORIDE 0.9 % IV SOLN: 250.0000 mL | INTRAVENOUS | Status: DC | PRN

## 2021-05-19 MED ORDER — LORAZEPAM 2 MG/ML IJ SOLN: 0.0000 mg | Freq: Two times a day (BID) | INTRAMUSCULAR | Status: DC

## 2021-05-19 MED ORDER — LIDOCAINE HCL (PF) 1 % IJ SOLN
INTRAMUSCULAR | Status: DC | PRN
  Administered 2021-05-19: 2 mL

## 2021-05-19 MED ORDER — TRAZODONE HCL 50 MG PO TABS
25.0000 mg | ORAL_TABLET | Freq: Every evening | ORAL | Status: DC | PRN
  Administered 2021-05-19 (×2): 25 mg via ORAL
  Filled 2021-05-19 (×2): qty 1

## 2021-05-19 MED ORDER — IOHEXOL 350 MG/ML SOLN
INTRAVENOUS | Status: DC | PRN
  Administered 2021-05-19: 36 mL

## 2021-05-19 MED ORDER — HEPARIN SODIUM (PORCINE) 1000 UNIT/ML IJ SOLN
INTRAMUSCULAR | Status: AC
  Filled 2021-05-19: qty 1

## 2021-05-19 MED ORDER — ASPIRIN 81 MG PO CHEW: 81.0000 mg | CHEWABLE_TABLET | ORAL | Status: AC

## 2021-05-19 MED ORDER — FENTANYL CITRATE (PF) 100 MCG/2ML IJ SOLN
INTRAMUSCULAR | Status: DC | PRN
  Administered 2021-05-19: 25 ug via INTRAVENOUS
  Administered 2021-05-19: 50 ug via INTRAVENOUS

## 2021-05-19 MED ORDER — HEPARIN BOLUS VIA INFUSION
3000.0000 [IU] | Freq: Once | INTRAVENOUS | Status: AC
  Administered 2021-05-19: 3000 [IU] via INTRAVENOUS
  Filled 2021-05-19: qty 3000

## 2021-05-19 MED ORDER — IPRATROPIUM-ALBUTEROL 0.5-2.5 (3) MG/3ML IN SOLN: 3.0000 mL | Freq: Once | RESPIRATORY_TRACT | Status: DC

## 2021-05-19 MED ORDER — SODIUM CHLORIDE 0.9% FLUSH: 3.0000 mL | Freq: Two times a day (BID) | INTRAVENOUS | Status: DC

## 2021-05-19 MED ORDER — MIDAZOLAM HCL 2 MG/2ML IJ SOLN
INTRAMUSCULAR | Status: DC | PRN
  Administered 2021-05-19: 1 mg via INTRAVENOUS

## 2021-05-19 MED ORDER — HEPARIN SODIUM (PORCINE) 1000 UNIT/ML IJ SOLN
INTRAMUSCULAR | Status: DC | PRN
  Administered 2021-05-19: 5000 [IU] via INTRAVENOUS

## 2021-05-19 MED ORDER — TECHNETIUM TC 99M TETROFOSMIN IV KIT
31.1200 | PACK | Freq: Once | INTRAVENOUS | Status: AC | PRN
  Administered 2021-05-19: 31.12 via INTRAVENOUS

## 2021-05-19 MED ORDER — LIDOCAINE HCL 1 % IJ SOLN
INTRAMUSCULAR | Status: AC
  Filled 2021-05-19: qty 20

## 2021-05-19 MED ORDER — SODIUM CHLORIDE 0.9 % IV SOLN: INTRAVENOUS | Status: AC

## 2021-05-19 SURGICAL SUPPLY — 11 items
CATH INFINITI 5FR JK (CATHETERS) ×2 IMPLANT
CATH INFINITI JR4 5F (CATHETERS) ×2 IMPLANT
DEVICE RAD TR BAND REGULAR (VASCULAR PRODUCTS) ×2 IMPLANT
DRAPE BRACHIAL (DRAPES) ×2 IMPLANT
GLIDESHEATH SLEND SS 6F .021 (SHEATH) ×2 IMPLANT
GUIDEWIRE INQWIRE 1.5J.035X260 (WIRE) ×1 IMPLANT
INQWIRE 1.5J .035X260CM (WIRE) ×2
PACK CARDIAC CATH (CUSTOM PROCEDURE TRAY) ×2 IMPLANT
PROTECTION STATION PRESSURIZED (MISCELLANEOUS) ×2
SET ATX SIMPLICITY (MISCELLANEOUS) ×2 IMPLANT
STATION PROTECTION PRESSURIZED (MISCELLANEOUS) ×1 IMPLANT

## 2021-05-19 NOTE — Care Management (Signed)
Patient is a 51 year old male with history of alcohol and substance abuse who presented to [facility] ED with chief complaint chest pain.  Troponins flat without significant delta.  Initially started on heparin GTT.  Will discontinue.  Cardiology planning for nuclear stress test today.  If negative patient may be able to discharge home later today.  Ralene Muskrat MD

## 2021-05-19 NOTE — Progress Notes (Signed)
*  PRELIMINARY RESULTS* Echocardiogram 2D Echocardiogram has been performed.  Sherrie Sport 05/19/2021, 2:29 PM

## 2021-05-19 NOTE — Plan of Care (Signed)
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
## 2021-05-19 NOTE — Progress Notes (Signed)
PROGRESS NOTE    Tyler Martinez  KYH:[id number] DOB: [birth year] DOA: 05/18/2021 PCP: Olin Hauser, DO   Brief Narrative:  50 y.o. male with medical history significant of hypertension, hyperlipidemia, diabetes mellitus, stroke, CAD, myocardial infarction, anxiety, cardiac arrest, alcohol abuse, amphetamine abuse, rheumatoid arthritis, tobacco abuse, who presents with chest pain and shortness of breath.      Pt has chest pain and shortness of breath for several days.  He was seen in ED yesterday, and had negative trop.  Patient continues to have chest pain and shortness of breath.  Chest pain is located in central chest, which is intermittent, pressure-like, 6 out of 10 in severity, radiating to the right neck.  Patient has cough with clear mucus production, no fever or chills.  Patient states that he had nausea and vomited once, currently has dry heaves.  No diarrhea or abdominal pain.  Patient complains of a tingling in bilateral feet and hands.  Patient underwent nuclear stress test.  Results of stress test abnormal.  Patient has reversible defect.  Discussed with cardiology.  Plan for cardiac catheterization 9/23.   Assessment & Plan:   Principal Problem:   Chest pain Active Problems:   Essential hypertension   Hyperlipidemia associated with type 2 diabetes mellitus (HCC)   History of cerebrovascular accident (CVA) with residual deficit   Type 2 diabetes mellitus with vascular disease (HCC)   Alcohol dependence (HCC)   CAD (coronary artery disease)   Tobacco abuse  Chest pain History of CAD Chest pain occurred for 3 days Radiation to right neck New T wave inversion in inferior leads Positive stress tests Plan: Cardiac catheterization today Discussed with cardiology  Essential hypertension PTA Coreg and Zestoretic As needed IV hydralazine  Hyperlipidemia PTA Crestor, dose increased to 20 mg daily  History of CVA with residual deficit Plavix Crestor  Type  2 diabetes mellitus Recent A1c 6.6, good control Taking metformin at home, now on hold Sliding scale insulin coverage  Tobacco abuse Alcohol abuse Polysubstance abuse Constipation Nicotine patch CIWA protocol  DVT prophylaxis: SQ Lovenox Code Status: Full Family Communication: None today Disposition Plan: Status is: Observation  The patient will require care spanning > 2 midnights and should be moved to inpatient because: Inpatient level of care appropriate due to severity of illness  Dispo: The patient is from: Home              Anticipated d/c is to: Home              Patient currently is not medically stable to d/c.   Difficult to place patient No       Level of care: Progressive Cardiac  Consultants:  Cardiology-[facility]  Procedures:  Nuclear stress test 9/23 Cardiac catheterization 9/23  Antimicrobials:  None   Subjective: Patient seen and examined.  Resting comfortably in bed.  No visible distress.  Does endorse dull central chest pain.  Also endorses right-sided neck pain.  Objective: Vitals:   05/18/21 2111 05/18/21 2357 05/19/21 0452 05/19/21 1253  BP:  (!) 145/94 (!) 140/93 138/82  Pulse:  69 66 65  Resp:  18 18 17   Temp:  98.5 F (36.9 C) 98.3 F (36.8 C) 98.2 F (36.8 C)  TempSrc:      SpO2: 92% 93% 94% 96%  Weight:      Height:        Intake/Output Summary (Last 24 hours) at 05/19/2021 1421 Last data filed at 05/19/2021 1254 Gross per 24 hour  Intake 426.53 ml  Output 650 ml  Net -223.47 ml   Filed Weights   05/18/21 1356  Weight: 97 kg    Examination:  General exam: Appears calm and comfortable  Respiratory system: Clear to auscultation. Respiratory effort normal. Cardiovascular system: S1-S2, RRR, no murmurs, no pedal edema Gastrointestinal system: Soft, NT/ND, normal bowel sounds Central nervous system: Alert and oriented. No focal neurological deficits. Extremities: Symmetric 5 x 5 power. Skin: No rashes, lesions or  ulcers Psychiatry: Judgement and insight appear normal. Mood & affect appropriate.     Data Reviewed: I have personally reviewed following labs and imaging studies  CBC: Recent Labs  Lab 05/17/21 1052 05/18/21 1421 05/19/21 0112  WBC 7.2 11.4* 7.7  HGB 15.1 15.2 14.8  HCT 40.8 40.7 41.3  MCV 97.1 98.1 99.8  PLT 272 287 798   Basic Metabolic Panel: Recent Labs  Lab 05/17/21 1052 05/18/21 1421  NA 128* 132*  K 3.6 3.7  CL 89* 92*  CO2 26 30  GLUCOSE 261* 180*  BUN 12 18  CREATININE 0.80 0.90  CALCIUM 8.9 9.7   GFR: Estimated Creatinine Clearance: 119.9 mL/min (by C-G formula based on SCr of 0.9 mg/dL). Liver Function Tests: No results for input(s): AST, ALT, ALKPHOS, BILITOT, PROT, ALBUMIN in the last 168 hours. No results for input(s): LIPASE, AMYLASE in the last 168 hours. No results for input(s): AMMONIA in the last 168 hours. Coagulation Profile: Recent Labs  Lab 05/18/21 1421  INR 0.9   Cardiac Enzymes: No results for input(s): CKTOTAL, CKMB, CKMBINDEX, TROPONINI in the last 168 hours. BNP (last 3 results) No results for input(s): PROBNP in the last 8760 hours. HbA1C: Recent Labs    05/18/21 1421  HGBA1C 6.8*   CBG: Recent Labs  Lab 05/18/21 1733 05/18/21 2314 05/19/21 0057 05/19/21 0326 05/19/21 1253  GLUCAP 177* 461* 386* 195* 167*   Lipid Profile: Recent Labs    05/19/21 0112  CHOL 244*  HDL 106  LDLCALC 116*  TRIG 112  CHOLHDL 2.3   Thyroid Function Tests: No results for input(s): TSH, T4TOTAL, FREET4, T3FREE, THYROIDAB in the last 72 hours. Anemia Panel: No results for input(s): VITAMINB12, FOLATE, FERRITIN, TIBC, IRON, RETICCTPCT in the last 72 hours. Sepsis Labs: No results for input(s): PROCALCITON, LATICACIDVEN in the last 168 hours.  Recent Results (from the past 240 hour(s))  Resp Panel by RT-PCR (Flu A&B, Covid) Nasopharyngeal Swab     Status: None   Collection Time: 05/17/21  3:10 PM   Specimen: Nasopharyngeal Swab;  Nasopharyngeal(NP) swabs in vial transport medium  Result Value Ref Range Status   SARS Coronavirus 2 by RT PCR NEGATIVE NEGATIVE Final    Comment: (NOTE) SARS-CoV-2 target nucleic acids are NOT DETECTED.  The SARS-CoV-2 RNA is generally detectable in upper respiratory specimens during the acute phase of infection. The lowest concentration of SARS-CoV-2 viral copies this assay can detect is 138 copies/mL. A negative result does not preclude SARS-Cov-2 infection and should not be used as the sole basis for treatment or other patient management decisions. A negative result may occur with  improper specimen collection/handling, submission of specimen other than nasopharyngeal swab, presence of viral mutation(s) within the areas targeted by this assay, and inadequate number of viral copies(<138 copies/mL). A negative result must be combined with clinical observations, patient history, and epidemiological information. The expected result is Negative.  Fact Sheet for Patients:  EntrepreneurPulse.com.au  Fact Sheet for Healthcare Providers:  IncredibleEmployment.be  This test is no t  yet approved or cleared by the [place] and  has been authorized for detection and/or diagnosis of SARS-CoV-2 by FDA under an Emergency Use Authorization (EUA). This EUA will remain  in effect (meaning this test can be used) for the duration of the COVID-19 declaration under Section 564(b)(1) of the Act, 21 U.S.C.section 360bbb-3(b)(1), unless the authorization is terminated  or revoked sooner.       Influenza A by PCR NEGATIVE NEGATIVE Final   Influenza B by PCR NEGATIVE NEGATIVE Final    Comment: (NOTE) The Xpert Xpress SARS-CoV-2/FLU/RSV plus assay is intended as an aid in the diagnosis of influenza from Nasopharyngeal swab specimens and should not be used as a sole basis for treatment. Nasal washings and aspirates are unacceptable for Xpert Xpress  SARS-CoV-2/FLU/RSV testing.  Fact Sheet for Patients: EntrepreneurPulse.com.au  Fact Sheet for Healthcare Providers: IncredibleEmployment.be  This test is not yet approved or cleared by the [place] FDA and has been authorized for detection and/or diagnosis of SARS-CoV-2 by FDA under an Emergency Use Authorization (EUA). This EUA will remain in effect (meaning this test can be used) for the duration of the COVID-19 declaration under Section 564(b)(1) of the Act, 21 U.S.C. section 360bbb-3(b)(1), unless the authorization is terminated or revoked.  Performed at [facility], [address] [place], [place] [address]   Resp Panel by RT-PCR (Flu A&B, Covid) Nasopharyngeal Swab     Status: None   Collection Time: 05/18/21  4:24 PM   Specimen: Nasopharyngeal Swab; Nasopharyngeal(NP) swabs in vial transport medium  Result Value Ref Range Status   SARS Coronavirus 2 by RT PCR NEGATIVE NEGATIVE Final    Comment: (NOTE) SARS-CoV-2 target nucleic acids are NOT DETECTED.  The SARS-CoV-2 RNA is generally detectable in upper respiratory specimens during the acute phase of infection. The lowest concentration of SARS-CoV-2 viral copies this assay can detect is 138 copies/mL. A negative result does not preclude SARS-Cov-2 infection and should not be used as the sole basis for treatment or other patient management decisions. A negative result may occur with  improper specimen collection/handling, submission of specimen other than nasopharyngeal swab, presence of viral mutation(s) within the areas targeted by this assay, and inadequate number of viral copies(<138 copies/mL). A negative result must be combined with clinical observations, patient history, and epidemiological information. The expected result is Negative.  Fact Sheet for Patients:  EntrepreneurPulse.com.au  Fact Sheet for Healthcare Providers:   IncredibleEmployment.be  This test is no t yet approved or cleared by the [place] FDA and  has been authorized for detection and/or diagnosis of SARS-CoV-2 by FDA under an Emergency Use Authorization (EUA). This EUA will remain  in effect (meaning this test can be used) for the duration of the COVID-19 declaration under Section 564(b)(1) of the Act, 21 U.S.C.section 360bbb-3(b)(1), unless the authorization is terminated  or revoked sooner.       Influenza A by PCR NEGATIVE NEGATIVE Final   Influenza B by PCR NEGATIVE NEGATIVE Final    Comment: (NOTE) The Xpert Xpress SARS-CoV-2/FLU/RSV plus assay is intended as an aid in the diagnosis of influenza from Nasopharyngeal swab specimens and should not be used as a sole basis for treatment. Nasal washings and aspirates are unacceptable for Xpert Xpress SARS-CoV-2/FLU/RSV testing.  Fact Sheet for Patients: EntrepreneurPulse.com.au  Fact Sheet for Healthcare Providers: IncredibleEmployment.be  This test is not yet approved or cleared by the [place] FDA and has been authorized for detection and/or diagnosis of SARS-CoV-2 by FDA  under an Emergency Use Authorization (EUA). This EUA will remain in effect (meaning this test can be used) for the duration of the COVID-19 declaration under Section 564(b)(1) of the Act, 21 U.S.C. section 360bbb-3(b)(1), unless the authorization is terminated or revoked.  Performed at [facility], [address] [place], [place] [address]          Radiology Studies: DG Chest 2 View  Result Date: 05/18/2021 CLINICAL DATA:  Shortness of breath.  Chest pain. EXAM: CHEST - 2 VIEW COMPARISON:  Yesterday FINDINGS: Prior median sternotomy. Mild right hemidiaphragm elevation. Remote right rib fractures. Midline trachea. Normal heart size. No pleural effusion or pneumothorax. Mild volume loss at the right lung base. IMPRESSION: No  acute cardiopulmonary disease. Electronically Signed   By: Abigail Miyamoto M.D.   On: 05/18/2021 14:56   US Carotid Bilateral  Result Date: 05/19/2021 CLINICAL DATA:  50 year old male with a history of chest pain EXAM: BILATERAL CAROTID DUPLEX ULTRASOUND TECHNIQUE: Pearline Cables scale imaging, color Doppler and duplex ultrasound were performed of bilateral carotid and vertebral arteries in the neck. COMPARISON:  None. FINDINGS: Criteria: Quantification of carotid stenosis is based on velocity parameters that correlate the residual internal carotid diameter with NASCET-based stenosis levels, using the diameter of the distal internal carotid lumen as the denominator for stenosis measurement. The following velocity measurements were obtained: RIGHT ICA:  Systolic 71 cm/sec, Diastolic 29 cm/sec CCA:  72 cm/sec SYSTOLIC ICA/CCA RATIO:  1.0 ECA:  95 cm/sec LEFT ICA:  Systolic 91 cm/sec, Diastolic 40 cm/sec CCA:  58 cm/sec SYSTOLIC ICA/CCA RATIO:  1.6 ECA:  72 cm/sec Right Brachial SBP: Not acquired Left Brachial SBP: Not acquired RIGHT CAROTID ARTERY: No significant calcifications of the right common carotid artery. Intermediate waveform maintained. Heterogeneous and partially calcified plaque at the right carotid bifurcation. No significant lumen shadowing. Low resistance waveform of the right ICA. Tortuosity RIGHT VERTEBRAL ARTERY: Antegrade flow with low resistance waveform. LEFT CAROTID ARTERY: No significant calcifications of the left common carotid artery. Intermediate waveform maintained. Heterogeneous and partially calcified plaque at the left carotid bifurcation without significant lumen shadowing. Low resistance waveform of the left ICA. Tortuosity LEFT VERTEBRAL ARTERY:  Antegrade flow with low resistance waveform. IMPRESSION: Color duplex indicates minimal heterogeneous and calcified plaque, with no hemodynamically significant stenosis by duplex criteria in the extracranial cerebrovascular circulation. Signed, Dulcy Fanny. Dellia Nims, RPVI [facility] Electronically Signed   By: Corrie Mckusick D.O.   On: 05/19/2021 11:49   NM Myocar Multi W/Spect W/Wall Motion / EF  Result Date: 05/19/2021   Findings are consistent with prior myocardial infarction with peri-infarct ischemia. The study is intermediate risk.   0.5 mm of horizontal ST depression in the inferolateral leads was noted.   LV perfusion is abnormal. There is evidence of ischemia. There is evidence of infarction. Defect 1: There is a medium defect with moderate reduction in uptake present in the basal inferior and inferoseptal location(s) that is partially reversible. There is abnormal wall motion in the defect area. Consistent with infarction and peri-infarct ischemia.   Left ventricular function is normal. End diastolic cavity size is normal. End systolic cavity size is normal.   CT ANGIO CHEST AORTA W/CM & OR WO/CM  Result Date: 05/18/2021 CLINICAL DATA:  Chest pain and shortness of breath. EXAM: CT ANGIOGRAPHY CHEST WITH CONTRAST TECHNIQUE: Multidetector CT imaging of the chest was performed using the standard protocol during bolus administration of intravenous contrast. Multiplanar CT image reconstructions  and MIPs were obtained to evaluate the vascular anatomy. CONTRAST:  30mL OMNIPAQUE IOHEXOL 350 MG/ML SOLN COMPARISON:  None. FINDINGS: Cardiovascular: The ascending thoracic aorta measures approximately 4.2 cm in diameter. Satisfactory opacification of the pulmonary arteries to the segmental level. No evidence of pulmonary embolism. Normal heart size with mild coronary artery calcification. No pericardial effusion. Mediastinum/Nodes: No enlarged mediastinal, hilar, or axillary lymph nodes. Thyroid gland, trachea, and esophagus demonstrate no significant findings. Lungs/Pleura: Very mild linear atelectasis and/or scarring is seen within the right lung base. There is no evidence of acute infiltrate,  pleural effusion, pneumothorax. Upper Abdomen: There is mild diffuse right adrenal gland enlargement. Diffuse enlargement of the left adrenal gland is also seen. A 3.3 cm x 2.7 cm low-attenuation left adrenal mass is present (approximately 27.46 Hounsfield units). Musculoskeletal: Multiple sternal wires are present. Chronic second through eighth lateral right rib fractures are seen. Review of the MIP images confirms the above findings. IMPRESSION: 1. No evidence of pulmonary embolism or acute cardiopulmonary disease. 2. Low-attenuation left adrenal mass which may represent an adrenal adenoma. Correlation with adrenal protocol CT is recommended. 3. Chronic second through eighth lateral right rib fractures. Electronically Signed   By: Virgina Norfolk M.D.   On: 05/18/2021 15:46        Scheduled Meds:  carvedilol  6.25 mg Oral BID WC   clopidogrel  75 mg Oral Daily   folic acid  1 mg Oral Daily   gabapentin  800 mg Oral QID   lisinopril  20 mg Oral Daily   And   hydrochlorothiazide  12.5 mg Oral Daily   [START ON 05/20/2021] influenza vac split quadrivalent PF  0.5 mL Intramuscular Tomorrow-1000   insulin aspart  0-20 Units Subcutaneous TID AC & HS   ipratropium-albuterol  3 mL Nebulization Once   LORazepam  0-4 mg Intravenous Q6H   Followed by   Derrill Memo ON 05/21/2021] LORazepam  0-4 mg Intravenous Q12H   multivitamin with minerals  1 tablet Oral Daily   nicotine  21 mg Transdermal Daily   rosuvastatin  40 mg Oral QHS   thiamine  100 mg Oral Daily   Or   thiamine  100 mg Intravenous Daily   Continuous Infusions:   LOS: 0 days    Time spent: 35 minutes    Sidney Ace, MD [facility] Pager [phone]  If 7PM-7AM, please contact night-coverage 05/19/2021, 2:21 PM

## 2021-05-19 NOTE — Consult Note (Signed)
[place] for Heparin infusion Indication: chest pain/ACS  Allergies  Allergen Reactions   Leflunomide Nausea And Vomiting   Methotrexate Derivatives Nausea And Vomiting    Have RA but can not tolerate any  Of the med  No components found with this name: [id number] saw Rheumatology in 2005   Other Nausea And Vomiting    Have RA but can not tolerate any  Of the med  No components found with this name: [id number] saw Rheumatology in 2005    Patient Measurements: Height: 6' (182.9 cm) Weight: 97 kg (213 lb 13.5 oz) IBW/kg (Calculated) : 77.6 Heparin Dosing Weight: 97kg  Vital Signs: Temp: 98.5 F (36.9 C) (09/22 2357) BP: 145/94 (09/22 2357) Pulse Rate: 69 (09/22 2357)  Labs: Recent Labs    05/17/21 1052 05/17/21 1252 05/18/21 1421 05/18/21 1624 05/19/21 0112  HGB 15.1  --  15.2  --  14.8  HCT 40.8  --  40.7  --  41.3  PLT 272  --  287  --  289  APTT  --   --  26  --   --   LABPROT  --   --  12.2  --   --   INR  --   --  0.9  --   --   HEPARINUNFRC  --   --   --   --  <0.10*  CREATININE 0.80  --  0.90  --   --   TROPONINIHS 13 12 16 17   --      Estimated Creatinine Clearance: 119.9 mL/min (by C-G formula based on SCr of 0.9 mg/dL).   Medical History: Past Medical History:  Diagnosis Date   Anxiety    Arthritis    Cardiac arrest ([facility]) 2008   Coronary artery disease    Heart attack ([place])    Hyperlipidemia    Hypertension    MI (myocardial infarction) ([place])    Stroke ([facility])     Medications:  Scheduled:   carvedilol  6.25 mg Oral BID WC   clopidogrel  75 mg Oral Daily   gabapentin  800 mg Oral QID   lisinopril  20 mg Oral Daily   And   hydrochlorothiazide  12.5 mg Oral Daily   insulin aspart  0-20 Units Subcutaneous TID AC & HS   nicotine  21 mg Transdermal Daily   rosuvastatin  10 mg Oral QHS    Assessment: Patient admitted to hospital with shortness of breath, PMH relevant for anxiety, cardiac arrest, MI,  HTN, HDL, and stroke.  Goal of Therapy:  Heparin level 0.3-0.7 units/ml Monitor platelets by anticoagulation protocol: Yes  9/23 0112 HL < 0.1, subtherapeutic   Plan:  Give 3000 unit bolus x 1 Increase heparin infusion to 1500 units/hr Recheck HL in 6 hr after rate change CBC daily while on heparin.  Renda Rolls, PharmD, [facility] 05/19/2021 2:24 AM

## 2021-05-20 DIAGNOSIS — I10 Essential (primary) hypertension: Secondary | ICD-10-CM | POA: Diagnosis not present

## 2021-05-20 DIAGNOSIS — E1169 Type 2 diabetes mellitus with other specified complication: Secondary | ICD-10-CM

## 2021-05-20 DIAGNOSIS — Z720 Tobacco use: Secondary | ICD-10-CM | POA: Diagnosis not present

## 2021-05-20 DIAGNOSIS — I25118 Atherosclerotic heart disease of native coronary artery with other forms of angina pectoris: Secondary | ICD-10-CM

## 2021-05-20 DIAGNOSIS — J441 Chronic obstructive pulmonary disease with (acute) exacerbation: Secondary | ICD-10-CM

## 2021-05-20 DIAGNOSIS — E785 Hyperlipidemia, unspecified: Secondary | ICD-10-CM | POA: Diagnosis not present

## 2021-05-20 DIAGNOSIS — I6930 Unspecified sequelae of cerebral infarction: Secondary | ICD-10-CM | POA: Diagnosis not present

## 2021-05-20 DIAGNOSIS — R079 Chest pain, unspecified: Secondary | ICD-10-CM | POA: Diagnosis not present

## 2021-05-20 DIAGNOSIS — F10282 Alcohol dependence with alcohol-induced sleep disorder: Secondary | ICD-10-CM | POA: Diagnosis not present

## 2021-05-20 LAB — GLUCOSE, CAPILLARY: Glucose-Capillary: 154 mg/dL — ABNORMAL HIGH (ref 70–99)

## 2021-05-20 MED ORDER — NICOTINE 21 MG/24HR TD PT24: 21.0000 mg | MEDICATED_PATCH | Freq: Every day | TRANSDERMAL | 0 refills | Status: DC

## 2021-05-20 MED ORDER — LISINOPRIL 20 MG PO TABS: 40.0000 mg | ORAL_TABLET | Freq: Every day | ORAL | Status: DC

## 2021-05-20 MED ORDER — EZETIMIBE 10 MG PO TABS: 10.0000 mg | ORAL_TABLET | Freq: Every day | ORAL | 0 refills | Status: DC

## 2021-05-20 MED ORDER — EZETIMIBE 10 MG PO TABS: 10.0000 mg | ORAL_TABLET | Freq: Every day | ORAL | Status: DC

## 2021-05-20 MED ORDER — ROSUVASTATIN CALCIUM 10 MG PO TABS: 20.0000 mg | ORAL_TABLET | Freq: Every day | ORAL | Status: DC

## 2021-05-20 MED ORDER — ROSUVASTATIN CALCIUM 20 MG PO TABS: 20.0000 mg | ORAL_TABLET | Freq: Every day | ORAL | 0 refills | Status: DC

## 2021-05-20 MED ORDER — LISINOPRIL 40 MG PO TABS: 40.0000 mg | ORAL_TABLET | Freq: Every day | ORAL | 0 refills | Status: DC

## 2021-05-20 MED ORDER — CARVEDILOL 12.5 MG PO TABS: 12.5000 mg | ORAL_TABLET | Freq: Two times a day (BID) | ORAL | Status: DC

## 2021-05-20 MED ORDER — CARVEDILOL 12.5 MG PO TABS: 12.5000 mg | ORAL_TABLET | Freq: Two times a day (BID) | ORAL | 0 refills | Status: DC

## 2021-05-20 NOTE — Plan of Care (Signed)

## 2021-05-20 NOTE — Discharge Summary (Signed)
Physician Discharge Summary  Tyler Martinez YYQ:[id number] DOB: 1971/02/04 DOA: 05/18/2021  PCP: Olin Hauser, DO  Admit date: 05/18/2021 Discharge date: 05/20/2021  Admitted From: Home Disposition: Home  Recommendations for Outpatient Follow-up:  Follow up with PCP in 1-2 weeks Consider referral for outpatient psychiatric care Follow-up with cardiology  Home Health: No Equipment/Devices: None  Discharge Condition: Stable CODE STATUS: Full Diet recommendation: Heart Healthy  Brief/Interim Summary: 50 y.o. male with medical history significant of hypertension, hyperlipidemia, diabetes mellitus, stroke, CAD, myocardial infarction, anxiety, cardiac arrest, alcohol abuse, amphetamine abuse, rheumatoid arthritis, tobacco abuse, who presents with chest pain and shortness of breath.      Pt has chest pain and shortness of breath for several days.  He was seen in ED yesterday, and had negative trop.  Patient continues to have chest pain and shortness of breath.  Chest pain is located in central chest, which is intermittent, pressure-like, 6 out of 10 in severity, radiating to the right neck.  Patient has cough with clear mucus production, no fever or chills.  Patient states that he had nausea and vomited once, currently has dry heaves.  No diarrhea or abdominal pain.  Patient complains of a tingling in bilateral feet and hands.   Patient underwent nuclear stress test.  Results of stress test abnormal.  Patient has reversible defect.  Discussed with cardiology.  Plan for cardiac catheterization 9/23.  Results of catheterization demonstrates a chronically occluded RCA stent with collateralization.  Per cardiology this is the likely source of the abnormal stress test and resultant chest pain.  No PCI intervention completed.  Recommend aggressive medical management.  On day of discharge patient is chest pain-free.  Discussed with cardiology.  Will adjust home medication regimen.  Increased  dose of Coreg.  Discontinue hydrochlorothiazide.  Increase dose of lisinopril.  Add Zetia to antihyperlipidemic regimen.  Discharged home with outpatient follow-up with PCP and cardiology.  Discharge Diagnoses:  Principal Problem:   Chest pain Active Problems:   Essential hypertension   Hyperlipidemia associated with type 2 diabetes mellitus ([place])   History of cerebrovascular accident (CVA) with residual deficit   Type 2 diabetes mellitus with vascular disease (HCC)   Alcohol dependence ([place])   CAD (coronary artery disease)   Tobacco abuse   Abnormal nuclear stress test Chest pain History of CAD Chest pain occurred for 3 days Radiation to right neck, carotid duplex negative New T wave inversion in inferior leads Positive stress tests Catheterization with chronically occluded RCA stent Left to right collateralization Likely source with abnormal stress test Aggressive medical management Optimize dose of Coreg and lisinopril Discontinue hydrochlorothiazide Add Zetia Discharge home with outpatient PCP and cardiology follow-up   Essential hypertension Coreg lisinopril on discharge.  Hydrochlorothiazide stopped   Hyperlipidemia PTA Crestor, dose increased to 20 mg daily Add Zetia   History of CVA with residual deficit Plavix Crestor   Type 2 diabetes mellitus Recent A1c 6.6, good control Can resume home metformin   Tobacco abuse Alcohol abuse Polysubstance abuse Counseled patient extensively.  Nicotine patches prescribed on discharge   Discharge Instructions  Discharge Instructions     Diet - low sodium heart healthy   Complete by: As directed    Increase activity slowly   Complete by: As directed       Allergies as of 05/20/2021       Reactions   Leflunomide Nausea And Vomiting   Methotrexate Derivatives Nausea And Vomiting   Have RA but can not tolerate any  Of the med  No components found with this name: [id number] saw Rheumatology in 2005   Other  Nausea And Vomiting   Have RA but can not tolerate any  Of the med  No components found with this name: [id number] saw Rheumatology in 2005        Medication List     STOP taking these medications    lisinopril-hydrochlorothiazide 20-12.5 MG tablet Commonly known as: Zestoretic       TAKE these medications    carvedilol 12.5 MG tablet Commonly known as: COREG Take 1 tablet (12.5 mg total) by mouth 2 (two) times daily with a meal. What changed:  medication strength how much to take   clopidogrel 75 MG tablet Commonly known as: PLAVIX Take 1 tablet (75 mg total) by mouth daily.   ezetimibe 10 MG tablet Commonly known as: ZETIA Take 1 tablet (10 mg total) by mouth daily. Start taking on: May 21, 2021   gabapentin 800 MG tablet Commonly known as: NEURONTIN Take 1 tablet (800 mg total) by mouth 4 (four) times daily.   lisinopril 40 MG tablet Commonly known as: ZESTRIL Take 1 tablet (40 mg total) by mouth daily. Start taking on: May 21, 2021   metFORMIN 1000 MG tablet Commonly known as: GLUCOPHAGE Take 1 tablet (1,000 mg total) by mouth 2 (two) times daily with a meal.   nicotine 21 mg/24hr patch Commonly known as: NICODERM CQ - dosed in mg/24 hours Place 1 patch (21 mg total) onto the skin daily. Start taking on: May 21, 2021   onetouch ultrasoft lancets Use to check blood sugar up to twice a day.   OneTouch Verio test strip Generic drug: glucose blood Use to check blood sugar up to twice a day.   OneTouch Verio w/Device Kit Use to check blood sugar up to twice a day.   rosuvastatin 20 MG tablet Commonly known as: CRESTOR Take 1 tablet (20 mg total) by mouth at bedtime. What changed:  medication strength how much to take   traMADol 50 MG tablet Commonly known as: ULTRAM Take 1 tablet (50 mg total) by mouth every 8 (eight) hours as needed.        Allergies  Allergen Reactions   Leflunomide Nausea And Vomiting   Methotrexate  Derivatives Nausea And Vomiting    Have RA but can not tolerate any  Of the med  No components found with this name: [id number] saw Rheumatology in 2005   Other Nausea And Vomiting    Have RA but can not tolerate any  Of the med  No components found with this name: [id number] saw Rheumatology in 2005    Consultations: Cardiology   Procedures/Studies: DG Chest 2 View  Result Date: 05/18/2021 CLINICAL DATA:  Shortness of breath.  Chest pain. EXAM: CHEST - 2 VIEW COMPARISON:  Yesterday FINDINGS: Prior median sternotomy. Mild right hemidiaphragm elevation. Remote right rib fractures. Midline trachea. Normal heart size. No pleural effusion or pneumothorax. Mild volume loss at the right lung base. IMPRESSION: No acute cardiopulmonary disease. Electronically Signed   By: Abigail Miyamoto M.D.   On: 05/18/2021 14:56   DG Chest 2 View  Result Date: 05/17/2021 CLINICAL DATA:  Shortness breath. EXAM: CHEST - 2 VIEW COMPARISON:  Two-view chest x-ray 05/01/2021 FINDINGS: Heart size is normal. Median sternotomy noted. Lungs are clear. Changes of COPD again noted. Remote right-sided rib fractures are present. IMPRESSION: No acute cardiopulmonary disease or significant interval change. Electronically Signed   By: Harrell Gave  Mattern M.D.   On: 05/17/2021 11:35   DG Chest 2 View  Result Date: 05/01/2021 CLINICAL DATA:  Shortness of breath EXAM: CHEST - 2 VIEW COMPARISON:  Report from chest radiograph dated 03/31/2012. FINDINGS: The heart size and mediastinal contours are within normal limits. There are mild bilateral perihilar opacities. There is no pleural effusion or pneumothorax. There is eventration of the right hemidiaphragm. Degenerative changes are seen in the spine. Median sternotomy wires are noted. IMPRESSION: Mild bilateral perihilar opacities may represent pulmonary vascular congestion. Electronically Signed   By: Zerita Boers M.D.   On: 05/01/2021 09:38   CARDIAC CATHETERIZATION  Result Date:  05/19/2021   Prox Cx to Mid Cx lesion is 40% stenosed.   Mid Cx to Dist Cx lesion is 50% stenosed.   Mid LAD lesion is 30% stenosed.   1st Diag lesion is 50% stenosed.   Prox RCA to Mid RCA lesion is 100% stenosed. 1.  Significant one-vessel coronary artery disease with chronically occluded RCA stents with well-developed left-to-right collaterals.  There is moderate LAD and left circumflex disease. 2.  Left ventricular angiography was not performed.  EF was normal by echo.  Normal left ventricular end-diastolic pressure. Recommendations: The occluded RCA is responsible for abnormal nuclear stress test.  However, the vessel has well-developed left-to-right collaterals and thus revascularization is not recommended. Recommend continuing aggressive medical therapy and smoking cessation.   US Carotid Bilateral  Result Date: 05/19/2021 CLINICAL DATA:  50 year old male with a history of chest pain EXAM: BILATERAL CAROTID DUPLEX ULTRASOUND TECHNIQUE: Pearline Cables scale imaging, color Doppler and duplex ultrasound were performed of bilateral carotid and vertebral arteries in the neck. COMPARISON:  None. FINDINGS: Criteria: Quantification of carotid stenosis is based on velocity parameters that correlate the residual internal carotid diameter with NASCET-based stenosis levels, using the diameter of the distal internal carotid lumen as the denominator for stenosis measurement. The following velocity measurements were obtained: RIGHT ICA:  Systolic 71 cm/sec, Diastolic 29 cm/sec CCA:  72 cm/sec SYSTOLIC ICA/CCA RATIO:  1.0 ECA:  95 cm/sec LEFT ICA:  Systolic 91 cm/sec, Diastolic 40 cm/sec CCA:  58 cm/sec SYSTOLIC ICA/CCA RATIO:  1.6 ECA:  72 cm/sec Right Brachial SBP: Not acquired Left Brachial SBP: Not acquired RIGHT CAROTID ARTERY: No significant calcifications of the right common carotid artery. Intermediate waveform maintained. Heterogeneous and partially calcified plaque at the right carotid bifurcation. No significant lumen  shadowing. Low resistance waveform of the right ICA. Tortuosity RIGHT VERTEBRAL ARTERY: Antegrade flow with low resistance waveform. LEFT CAROTID ARTERY: No significant calcifications of the left common carotid artery. Intermediate waveform maintained. Heterogeneous and partially calcified plaque at the left carotid bifurcation without significant lumen shadowing. Low resistance waveform of the left ICA. Tortuosity LEFT VERTEBRAL ARTERY:  Antegrade flow with low resistance waveform. IMPRESSION: Color duplex indicates minimal heterogeneous and calcified plaque, with no hemodynamically significant stenosis by duplex criteria in the extracranial cerebrovascular circulation. Signed, Dulcy Fanny. Dellia Nims, RPVI [facility] Radiology Electronically Signed   By: Corrie Mckusick D.O.   On: 05/19/2021 11:49   NM Myocar Multi W/Spect W/Wall Motion / EF  Result Date: 05/19/2021   Findings are consistent with prior myocardial infarction with peri-infarct ischemia. The study is intermediate risk.   0.5 mm of horizontal ST depression in the inferolateral leads was noted.   LV perfusion is abnormal. There is evidence of ischemia. There is evidence of infarction. Defect 1: There is a medium defect with moderate reduction in uptake  present in the basal inferior and inferoseptal location(s) that is partially reversible. There is abnormal wall motion in the defect area. Consistent with infarction and peri-infarct ischemia.   Left ventricular function is normal. End diastolic cavity size is normal. End systolic cavity size is normal.   CT ANGIO CHEST AORTA W/CM & OR WO/CM  Result Date: 05/18/2021 CLINICAL DATA:  Chest pain and shortness of breath. EXAM: CT ANGIOGRAPHY CHEST WITH CONTRAST TECHNIQUE: Multidetector CT imaging of the chest was performed using the standard protocol during bolus administration of intravenous contrast. Multiplanar CT image reconstructions and MIPs were  obtained to evaluate the vascular anatomy. CONTRAST:  4mL OMNIPAQUE IOHEXOL 350 MG/ML SOLN COMPARISON:  None. FINDINGS: Cardiovascular: The ascending thoracic aorta measures approximately 4.2 cm in diameter. Satisfactory opacification of the pulmonary arteries to the segmental level. No evidence of pulmonary embolism. Normal heart size with mild coronary artery calcification. No pericardial effusion. Mediastinum/Nodes: No enlarged mediastinal, hilar, or axillary lymph nodes. Thyroid gland, trachea, and esophagus demonstrate no significant findings. Lungs/Pleura: Very mild linear atelectasis and/or scarring is seen within the right lung base. There is no evidence of acute infiltrate, pleural effusion, pneumothorax. Upper Abdomen: There is mild diffuse right adrenal gland enlargement. Diffuse enlargement of the left adrenal gland is also seen. A 3.3 cm x 2.7 cm low-attenuation left adrenal mass is present (approximately 27.46 Hounsfield units). Musculoskeletal: Multiple sternal wires are present. Chronic second through eighth lateral right rib fractures are seen. Review of the MIP images confirms the above findings. IMPRESSION: 1. No evidence of pulmonary embolism or acute cardiopulmonary disease. 2. Low-attenuation left adrenal mass which may represent an adrenal adenoma. Correlation with adrenal protocol CT is recommended. 3. Chronic second through eighth lateral right rib fractures. Electronically Signed   By: Virgina Norfolk M.D.   On: 05/18/2021 15:46   ECHOCARDIOGRAM COMPLETE  Result Date: 05/19/2021    ECHOCARDIOGRAM REPORT   Patient Name:   Tyler Martinez Date of Exam: 05/19/2021 Medical Rec #:  [id number]    Height:       72.0 in Accession #:    [id number]   Weight:       213.8 lb Date of Birth:  July 02, 1971   BSA:          2.192 m Patient Age:    50 years     BP:           140/93 mmHg Patient Gender: M            HR:           66 bpm. Exam Location:  [facility] Procedure: 2D Echo, Cardiac Doppler, Color Doppler  and Strain Analysis Indications:     Chest pain R07.9  History:         Patient has no prior history of Echocardiogram examinations.                  CHF, Previous Myocardial Infarction, Stroke; Risk                  Factors:Dyslipidemia and Hypertension.  Sonographer:     Sherrie Sport Referring Phys:  [id number] [place] Diagnosing Phys: Kathlyn Sacramento MD  Sonographer Comments: Global longitudinal strain was attempted. IMPRESSIONS  1. Left ventricular ejection fraction, by estimation, is 60 to 65%. The left ventricle has normal function. The left ventricle demonstrates regional wall motion abnormalities (see scoring diagram/findings for description). There is mild left ventricular  hypertrophy. Left ventricular diastolic parameters are consistent with Grade  I diastolic dysfunction (impaired relaxation). There is mild hypokinesis of the left ventricular, basal inferior wall. The average left ventricular global longitudinal strain is -20.8 %. The global longitudinal strain is normal.  2. Right ventricular systolic function is normal. The right ventricular size is normal. There is normal pulmonary artery systolic pressure.  3. Left atrial size was mildly dilated.  4. The mitral valve is normal in structure. No evidence of mitral valve regurgitation. No evidence of mitral stenosis.  5. The aortic valve is normal in structure. Aortic valve regurgitation is not visualized. No aortic stenosis is present.  6. Aortic dilatation noted. There is mild dilatation of the aortic root, measuring 40 mm.  7. The inferior vena cava is normal in size with greater than 50% respiratory variability, suggesting right atrial pressure of 3 mmHg. FINDINGS  Left Ventricle: Left ventricular ejection fraction, by estimation, is 60 to 65%. The left ventricle has normal function. The left ventricle demonstrates regional wall motion abnormalities. Mild hypokinesis of the left ventricular, basal inferior wall. The average left ventricular global  longitudinal strain is -20.8 %. The global longitudinal strain is normal. The left ventricular internal cavity size was normal in size. There is mild left ventricular hypertrophy. Left ventricular diastolic parameters are consistent with Grade I diastolic dysfunction (impaired relaxation). Right Ventricle: The right ventricular size is normal. No increase in right ventricular wall thickness. Right ventricular systolic function is normal. There is normal pulmonary artery systolic pressure. The tricuspid regurgitant velocity is 2.32 m/s, and  with an assumed right atrial pressure of 3 mmHg, the estimated right ventricular systolic pressure is 79.1 mmHg. Left Atrium: Left atrial size was mildly dilated. Right Atrium: Right atrial size was normal in size. Pericardium: There is no evidence of pericardial effusion. Mitral Valve: The mitral valve is normal in structure. No evidence of mitral valve regurgitation. No evidence of mitral valve stenosis. Tricuspid Valve: The tricuspid valve is normal in structure. Tricuspid valve regurgitation is trivial. No evidence of tricuspid stenosis. Aortic Valve: The aortic valve is normal in structure. Aortic valve regurgitation is not visualized. No aortic stenosis is present. Aortic valve mean gradient measures 5.3 mmHg. Aortic valve peak gradient measures 8.4 mmHg. Aortic valve area, by VTI measures 3.48 cm. Pulmonic Valve: The pulmonic valve was normal in structure. Pulmonic valve regurgitation is not visualized. No evidence of pulmonic stenosis. Aorta: Aortic dilatation noted. There is mild dilatation of the aortic root, measuring 40 mm. Venous: The inferior vena cava is normal in size with greater than 50% respiratory variability, suggesting right atrial pressure of 3 mmHg. IAS/Shunts: No atrial level shunt detected by color flow Doppler.  LEFT VENTRICLE PLAX 2D LVIDd:         4.40 cm  Diastology LVIDs:         2.60 cm  LV e' medial:    6.53 cm/s LV PW:         1.50 cm  LV E/e'  medial:  8.9 LV IVS:        0.85 cm  LV e' lateral:   11.30 cm/s LVOT diam:     2.20 cm  LV E/e' lateral: 5.1 LV SV:         87 LV SV Index:   40       2D Longitudinal Strain LVOT Area:     3.80 cm 2D Strain GLS Avg:     -20.8 %  3D Volume EF:                         3D EF:        69 %                         LV EDV:       151 ml                         LV ESV:       47 ml                         LV SV:        103 ml RIGHT VENTRICLE RV Basal diam:  4.10 cm RV S prime:     10.10 cm/s TAPSE (M-mode): 4.2 cm LEFT ATRIUM             Index       RIGHT ATRIUM           Index LA diam:        4.80 cm 2.19 cm/m  RA Area:     17.20 cm LA Vol (A2C):   65.8 ml 30.02 ml/m RA Volume:   37.50 ml  17.11 ml/m LA Vol (A4C):   77.3 ml 35.26 ml/m LA Biplane Vol: 72.7 ml 33.17 ml/m  AORTIC VALVE                    PULMONIC VALVE AV Area (Vmax):    3.22 cm     PV Vmax:        1.11 m/s AV Area (Vmean):   2.85 cm     PV Peak grad:   4.9 mmHg AV Area (VTI):     3.48 cm     RVOT Peak grad: 6 mmHg AV Vmax:           145.00 cm/s AV Vmean:          108.367 cm/s AV VTI:            0.251 m AV Peak Grad:      8.4 mmHg AV Mean Grad:      5.3 mmHg LVOT Vmax:         123.00 cm/s LVOT Vmean:        81.200 cm/s LVOT VTI:          0.230 m LVOT/AV VTI ratio: 0.92  AORTA Ao Root diam: 3.80 cm MITRAL VALVE               TRICUSPID VALVE MV Area (PHT): 3.27 cm    TR Peak grad:   21.5 mmHg MV Decel Time: 232 msec    TR Vmax:        232.00 cm/s MV E velocity: 57.80 cm/s MV A velocity: 88.30 cm/s  SHUNTS MV E/A ratio:  0.65        Systemic VTI:  0.23 m                            Systemic Diam: 2.20 cm Kathlyn Sacramento MD Electronically signed by Kathlyn Sacramento MD Signature Date/Time: 05/19/2021/3:38:25 PM    Final    (Echo, Carotid, EGD, Colonoscopy, ERCP)    Subjective: Seen and examined on the day of discharge.  Stable no distress.  Chest pain-free.  Stable for discharge home.  Discharge Exam: Vitals:   05/20/21 0411  05/20/21 0802  BP: (!) 127/97 (!) 149/109  Pulse: 65 74  Resp: 18 18  Temp: 99 F (37.2 C) (!) 97.5 F (36.4 C)  SpO2: 94% 96%   Vitals:   05/19/21 1949 05/20/21 0001 05/20/21 0411 05/20/21 0802  BP: 132/69 102/70 (!) 127/97 (!) 149/109  Pulse: 73 66 65 74  Resp: 18 19 18 18   Temp: 98.3 F (36.8 C) 98 F (36.7 C) 99 F (37.2 C) (!) 97.5 F (36.4 C)  TempSrc:    Oral  SpO2: 94% 90% 94% 96%  Weight:      Height:        General: Pt is alert, awake, not in acute distress Cardiovascular: RRR, S1/S2 +, no rubs, no gallops Respiratory: CTA bilaterally, no wheezing, no rhonchi Abdominal: Soft, NT, ND, bowel sounds + Extremities: no edema, no cyanosis    The results of significant diagnostics from this hospitalization (including imaging, microbiology, ancillary and laboratory) are listed below for reference.     Microbiology: Recent Results (from the past 240 hour(s))  Resp Panel by RT-PCR (Flu A&B, Covid) Nasopharyngeal Swab     Status: None   Collection Time: 05/17/21  3:10 PM   Specimen: Nasopharyngeal Swab; Nasopharyngeal(NP) swabs in vial transport medium  Result Value Ref Range Status   SARS Coronavirus 2 by RT PCR NEGATIVE NEGATIVE Final    Comment: (NOTE) SARS-CoV-2 target nucleic acids are NOT DETECTED.  The SARS-CoV-2 RNA is generally detectable in upper respiratory specimens during the acute phase of infection. The lowest concentration of SARS-CoV-2 viral copies this assay can detect is 138 copies/mL. A negative result does not preclude SARS-Cov-2 infection and should not be used as the sole basis for treatment or other patient management decisions. A negative result may occur with  improper specimen collection/handling, submission of specimen other than nasopharyngeal swab, presence of viral mutation(s) within the areas targeted by this assay, and inadequate number of viral copies(<138 copies/mL). A negative result must be combined with clinical  observations, patient history, and epidemiological information. The expected result is Negative.  Fact Sheet for Patients:  EntrepreneurPulse.com.au  Fact Sheet for Healthcare Providers:  IncredibleEmployment.be  This test is no t yet approved or cleared by the [place] FDA and  has been authorized for detection and/or diagnosis of SARS-CoV-2 by FDA under an Emergency Use Authorization (EUA). This EUA will remain  in effect (meaning this test can be used) for the duration of the COVID-19 declaration under Section 564(b)(1) of the Act, 21 U.S.C.section 360bbb-3(b)(1), unless the authorization is terminated  or revoked sooner.       Influenza A by PCR NEGATIVE NEGATIVE Final   Influenza B by PCR NEGATIVE NEGATIVE Final    Comment: (NOTE) The Xpert Xpress SARS-CoV-2/FLU/RSV plus assay is intended as an aid in the diagnosis of influenza from Nasopharyngeal swab specimens and should not be used as a sole basis for treatment. Nasal washings and aspirates are unacceptable for Xpert Xpress SARS-CoV-2/FLU/RSV testing.  Fact Sheet for Patients: EntrepreneurPulse.com.au  Fact Sheet for Healthcare Providers: IncredibleEmployment.be  This test is not yet approved or cleared by the [place] FDA and has been authorized for detection and/or diagnosis of SARS-CoV-2 by FDA under an Emergency Use Authorization (EUA). This EUA will remain in effect (meaning this test can be used) for the duration of the COVID-19 declaration under Section 564(b)(1) of the Act, 21 U.S.C. section 360bbb-3(b)(1), unless the authorization  is terminated or revoked.  Performed at [facility], [address] [place], [place] [address]   Resp Panel by RT-PCR (Flu A&B, Covid) Nasopharyngeal Swab     Status: None   Collection Time: 05/18/21  4:24 PM   Specimen: Nasopharyngeal Swab; Nasopharyngeal(NP) swabs in vial transport  medium  Result Value Ref Range Status   SARS Coronavirus 2 by RT PCR NEGATIVE NEGATIVE Final    Comment: (NOTE) SARS-CoV-2 target nucleic acids are NOT DETECTED.  The SARS-CoV-2 RNA is generally detectable in upper respiratory specimens during the acute phase of infection. The lowest concentration of SARS-CoV-2 viral copies this assay can detect is 138 copies/mL. A negative result does not preclude SARS-Cov-2 infection and should not be used as the sole basis for treatment or other patient management decisions. A negative result may occur with  improper specimen collection/handling, submission of specimen other than nasopharyngeal swab, presence of viral mutation(s) within the areas targeted by this assay, and inadequate number of viral copies(<138 copies/mL). A negative result must be combined with clinical observations, patient history, and epidemiological information. The expected result is Negative.  Fact Sheet for Patients:  EntrepreneurPulse.com.au  Fact Sheet for Healthcare Providers:  [employer]  This test is no t yet approved or cleared by the [place] FDA and  has been authorized for detection and/or diagnosis of SARS-CoV-2 by FDA under an Emergency Use Authorization (EUA). This EUA will remain  in effect (meaning this test can be used) for the duration of the COVID-19 declaration under Section 564(b)(1) of the Act, 21 U.S.C.section 360bbb-3(b)(1), unless the authorization is terminated  or revoked sooner.       Influenza A by PCR NEGATIVE NEGATIVE Final   Influenza B by PCR NEGATIVE NEGATIVE Final    Comment: (NOTE) The Xpert Xpress SARS-CoV-2/FLU/RSV plus assay is intended as an aid in the diagnosis of influenza from Nasopharyngeal swab specimens and should not be used as a sole basis for treatment. Nasal washings and aspirates are unacceptable for Xpert Xpress SARS-CoV-2/FLU/RSV testing.  Fact Sheet for  Patients: EntrepreneurPulse.com.au  Fact Sheet for Healthcare Providers: [employer]  This test is not yet approved or cleared by the [place] FDA and has been authorized for detection and/or diagnosis of SARS-CoV-2 by FDA under an Emergency Use Authorization (EUA). This EUA will remain in effect (meaning this test can be used) for the duration of the COVID-19 declaration under Section 564(b)(1) of the Act, 21 U.S.C. section 360bbb-3(b)(1), unless the authorization is terminated or revoked.  Performed at [facility], [address] [place], [place] [address]      Labs: BNP (last 3 results) Recent Labs    05/01/21 0911  BNP 94.7   Basic Metabolic Panel: Recent Labs  Lab 05/17/21 1052 05/18/21 1421  NA 128* 132*  K 3.6 3.7  CL 89* 92*  CO2 26 30  GLUCOSE 261* 180*  BUN 12 18  CREATININE 0.80 0.90  CALCIUM 8.9 9.7   Liver Function Tests: No results for input(s): AST, ALT, ALKPHOS, BILITOT, PROT, ALBUMIN in the last 168 hours. No results for input(s): LIPASE, AMYLASE in the last 168 hours. No results for input(s): AMMONIA in the last 168 hours. CBC: Recent Labs  Lab 05/17/21 1052 05/18/21 1421 05/19/21 0112  WBC 7.2 11.4* 7.7  HGB 15.1 15.2 14.8  HCT 40.8 40.7 41.3  MCV 97.1 98.1 99.8  PLT 272 287 289   Cardiac Enzymes: No results for input(s): CKTOTAL, CKMB, CKMBINDEX, TROPONINI in the last  168 hours. BNP: Invalid input(s): POCBNP CBG: Recent Labs  Lab 05/19/21 1253 05/19/21 1518 05/19/21 1610 05/19/21 2059 05/20/21 0757  GLUCAP 167* 219* 163* 155* 154*   D-Dimer No results for input(s): DDIMER in the last 72 hours. Hgb A1c Recent Labs    05/18/21 1421  HGBA1C 6.8*   Lipid Profile Recent Labs    05/19/21 0112  CHOL 244*  HDL 106  LDLCALC 116*  TRIG 112  CHOLHDL 2.3   Thyroid function studies No results for input(s): TSH, T4TOTAL, T3FREE, THYROIDAB in the last 72  hours.  Invalid input(s): FREET3 Anemia work up No results for input(s): VITAMINB12, FOLATE, FERRITIN, TIBC, IRON, RETICCTPCT in the last 72 hours. Urinalysis No results found for: COLORURINE, APPEARANCEUR, [place], [place], GLUCOSEU, [place], [place], [place], PROTEINUR, UROBILINOGEN, NITRITE, LEUKOCYTESUR Sepsis Labs Invalid input(s): PROCALCITONIN,  WBC,  LACTICIDVEN Microbiology Recent Results (from the past 240 hour(s))  Resp Panel by RT-PCR (Flu A&B, Covid) Nasopharyngeal Swab     Status: None   Collection Time: 05/17/21  3:10 PM   Specimen: Nasopharyngeal Swab; Nasopharyngeal(NP) swabs in vial transport medium  Result Value Ref Range Status   SARS Coronavirus 2 by RT PCR NEGATIVE NEGATIVE Final    Comment: (NOTE) SARS-CoV-2 target nucleic acids are NOT DETECTED.  The SARS-CoV-2 RNA is generally detectable in upper respiratory specimens during the acute phase of infection. The lowest concentration of SARS-CoV-2 viral copies this assay can detect is 138 copies/mL. A negative result does not preclude SARS-Cov-2 infection and should not be used as the sole basis for treatment or other patient management decisions. A negative result may occur with  improper specimen collection/handling, submission of specimen other than nasopharyngeal swab, presence of viral mutation(s) within the areas targeted by this assay, and inadequate number of viral copies(<138 copies/mL). A negative result must be combined with clinical observations, patient history, and epidemiological information. The expected result is Negative.  Fact Sheet for Patients:  EntrepreneurPulse.com.au  Fact Sheet for Healthcare Providers:  [employer]  This test is no t yet approved or cleared by the [place] FDA and  has been authorized for detection and/or diagnosis of SARS-CoV-2 by FDA under an Emergency Use Authorization (EUA). This EUA will remain  in  effect (meaning this test can be used) for the duration of the COVID-19 declaration under Section 564(b)(1) of the Act, 21 U.S.C.section 360bbb-3(b)(1), unless the authorization is terminated  or revoked sooner.       Influenza A by PCR NEGATIVE NEGATIVE Final   Influenza B by PCR NEGATIVE NEGATIVE Final    Comment: (NOTE) The Xpert Xpress SARS-CoV-2/FLU/RSV plus assay is intended as an aid in the diagnosis of influenza from Nasopharyngeal swab specimens and should not be used as a sole basis for treatment. Nasal washings and aspirates are unacceptable for Xpert Xpress SARS-CoV-2/FLU/RSV testing.  Fact Sheet for Patients: EntrepreneurPulse.com.au  Fact Sheet for Healthcare Providers: [employer]  This test is not yet approved or cleared by the [place] FDA and has been authorized for detection and/or diagnosis of SARS-CoV-2 by FDA under an Emergency Use Authorization (EUA). This EUA will remain in effect (meaning this test can be used) for the duration of the COVID-19 declaration under Section 564(b)(1) of the Act, 21 U.S.C. section 360bbb-3(b)(1), unless the authorization is terminated or revoked.  Performed at [facility], [address] [place], [place] [address]   Resp Panel by RT-PCR (Flu A&B, Covid) Nasopharyngeal Swab     Status: None   Collection Time: 05/18/21  4:24 PM   Specimen: Nasopharyngeal Swab; Nasopharyngeal(NP) swabs in vial transport medium  Result Value Ref Range Status   SARS Coronavirus 2 by RT PCR NEGATIVE NEGATIVE Final    Comment: (NOTE) SARS-CoV-2 target nucleic acids are NOT DETECTED.  The SARS-CoV-2 RNA is generally detectable in upper respiratory specimens during the acute phase of infection. The lowest concentration of SARS-CoV-2 viral copies this assay can detect is 138 copies/mL. A negative result does not preclude SARS-Cov-2 infection and should not be used as the sole basis  for treatment or other patient management decisions. A negative result may occur with  improper specimen collection/handling, submission of specimen other than nasopharyngeal swab, presence of viral mutation(s) within the areas targeted by this assay, and inadequate number of viral copies(<138 copies/mL). A negative result must be combined with clinical observations, patient history, and epidemiological information. The expected result is Negative.  Fact Sheet for Patients:  EntrepreneurPulse.com.au  Fact Sheet for Healthcare Providers:  [employer]be  This test is no t yet approved or cleared by the [place] FDA and  has been authorized for detection and/or diagnosis of SARS-CoV-2 by FDA under an Emergency Use Authorization (EUA). This EUA will remain  in effect (meaning this test can be used) for the duration of the COVID-19 declaration under Section 564(b)(1) of the Act, 21 U.S.C.section 360bbb-3(b)(1), unless the authorization is terminated  or revoked sooner.       Influenza A by PCR NEGATIVE NEGATIVE Final   Influenza B by PCR NEGATIVE NEGATIVE Final    Comment: (NOTE) The Xpert Xpress SARS-CoV-2/FLU/RSV plus assay is intended as an aid in the diagnosis of influenza from Nasopharyngeal swab specimens and should not be used as a sole basis for treatment. Nasal washings and aspirates are unacceptable for Xpert Xpress SARS-CoV-2/FLU/RSV testing.  Fact Sheet for Patients: EntrepreneurPulse.com.au  Fact Sheet for Healthcare Providers: [employer]ible[employer]be  This test is not yet approved or cleared by the [place] FDA and has been authorized for detection and/or diagnosis of SARS-CoV-2 by FDA under an Emergency Use Authorization (EUA). This EUA will remain in effect (meaning this test can be used) for the duration of the COVID-19 declaration under Section 564(b)(1) of the Act, 21  U.S.C. section 360bbb-3(b)(1), unless the authorization is terminated or revoked.  Performed at [facility], [address] [place], [place] [address]      Time coordinating discharge: Over 30 minutes  SIGNED:   Sidney Ace, MD  [facility] 05/20/2021, 1:30 PM Pager   If 7PM-7AM, please contact night-coverage
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
## 2021-05-22 ENCOUNTER — Encounter: Payer: Self-pay | Admitting: Cardiovascular Disease

## 2021-05-22 ENCOUNTER — Telehealth: Payer: Self-pay | Admitting: *Deleted

## 2021-05-22 ENCOUNTER — Ambulatory Visit: Payer: Medicare Other | Admitting: Family Medicine

## 2021-05-22 NOTE — Telephone Encounter (Signed)
Patient needs 2 week appointment post NSTEMI discharge on 05/20/21.

## 2021-05-22 NOTE — Telephone Encounter (Signed)
-----   Message from Santel, PA-C sent at 05/20/2021 11:22 AM EDT ----- Regarding: hospital follow-up Pt needs hospital follow-up 2 weeks, can be TOC for NSTEMI. Thanks!

## 2021-05-22 NOTE — Telephone Encounter (Signed)
Patient calling Would like to discuss his diagnosis from the hospital  Has short term memory loss and struggling to remember what was discussed

## 2021-05-23 NOTE — Telephone Encounter (Signed)
1st attempt TCM & to follow up with the patient regarding his questions about his diagnosis from his hospitalization.   No answer & no voice mail.   Will attempt to call the patient back at a later time.

## 2021-05-23 NOTE — Telephone Encounter (Signed)
Was able to reach pt Tyler Martinez regarding his recent hospital admission   Patient contacted regarding discharge from [facility] on 05/20/2021.  Patient understands to follow up with provider Cadence Kathlen Mody, PA-C on 05/25/2021 at 09:00 am at [facility].  Patient understands discharge instructions? Yes  Patient understands medications and regiment? Yes, has concern for increase carvedilol increase to 12.5 mg BID from 6.25 mg BID, reports the increase (which he has been on this dose in the past) cause him to become dizzy, manageable now, but wants to discuss further at appt Thursday.    Increased Crestor from 10 to 20 mg QD  Has STOP lisinopril-hydrochlorothiazide 20-12.5 MG Started  Zetia 10 mg QD Lisinopril 40 mg QD  Patient understands to bring all medications to this visit? Yes  Pt would like to discuss his "blocked 4 stents" as to what options he has and if he will need to consult with cardiothoracic for possible bypass or opening his stents. Advised can discuss with Cadence on Thursday to review options. Pt verbalized understanding and thankful for calling to f/u with him. Will be attending appt on Thursday.

## 2021-05-23 NOTE — Telephone Encounter (Signed)
Patient returning call for toc questions

## 2021-05-23 NOTE — Progress Notes (Signed)
/ Cardiology Office Note:    Date:  05/25/2021   ID:  Tyler Martinez, DOB 1970-09-08, MRN [id number]  PCP:  Olin Hauser, DO  [facility] Cardiologist:  Dr. [facility] Electrophysiologist:  None   Referring MD: Nobie Putnam *   Chief Complaint: Hospital follow-up  History of Present Illness:    Tyler Martinez is a 50 y.o. male with a hx of HTN, DM2, alcohol abuse, tobacco use, CAD with prior stenting in 2008 and 2012 in [place], TMI, [place] 2005 who is being seen for chest pain.   Admitted 9/22-9/24 with chest pain and SOB. HS trop negative. EKG with TWI lateral leads. Myoview Lexiscan was abnormal with reversible defect so he was set up for LHC. LHC showed significant one vessel CAD with chronically occluded RCA stents with well-developed L>R collaterals, moderate LAD and left Cx disease, normal LVEDP. Due to collaterals PCI was not performed. Echo showed LVEF 60-65%, mild LVH, G1DD, mildly dilated LA, no MR, mild dilation of aortic root measuring 62mm. He was discharged on coreg, plavix, statin.   Today, the patient reports he has not been doing well since the discharge. He is still having chest pain, shortness of breath, nasuea. Always feels cold. Very anxious. Reports has been taking all his medications. He has chest tightness, it's constant. Worse after exertion. Also when he experiences nasuea and shortness of breath. Coreg seems to help the pain.  Cath site, right radial, is stable. Has intermittent dependent LLE. No orthopnea or pnd. He is still smoking and dirnking 1 pint of Vodka nightly.   Past Medical History:  Diagnosis Date   Anxiety    Arthritis    Cardiac arrest [facility]) 2008   Coronary artery disease    Heart attack ([place])    Hyperlipidemia    Hypertension    MI (myocardial infarction) ([place])    Stroke [facility])     Past Surgical History:  Procedure Laterality Date   CAROTID STENT  2008   EXPLORATION POST OPERATIVE OPEN HEART  2008   HERNIA  REPAIR  2008   inguinal   LEFT HEART CATH AND CORONARY ANGIOGRAPHY N/A 05/19/2021   Procedure: LEFT HEART CATH AND CORONARY ANGIOGRAPHY;  Surgeon: Wellington Hampshire, MD;  Location: [facility];  Service: Cardiovascular;  Laterality: N/A;    Current Medications: Current Meds  Medication Sig   Blood Glucose Monitoring Suppl (ONETOUCH VERIO) w/Device KIT Use to check blood sugar up to twice a day.   carvedilol (COREG) 12.5 MG tablet Take 1 tablet (12.5 mg total) by mouth 2 (two) times daily with a meal.   clopidogrel (PLAVIX) 75 MG tablet Take 1 tablet (75 mg total) by mouth daily.   ezetimibe (ZETIA) 10 MG tablet Take 1 tablet (10 mg total) by mouth daily.   gabapentin (NEURONTIN) 800 MG tablet Take 1 tablet (800 mg total) by mouth 4 (four) times daily.   isosorbide mononitrate (IMDUR) 30 MG 24 hr tablet Take 1 tablet (30 mg total) by mouth daily.   Lancets (ONETOUCH ULTRASOFT) lancets Use to check blood sugar up to twice a day.   lisinopril (ZESTRIL) 40 MG tablet Take 1 tablet (40 mg total) by mouth daily.   metFORMIN (GLUCOPHAGE) 1000 MG tablet Take 1 tablet (1,000 mg total) by mouth 2 (two) times daily with a meal.   ONETOUCH VERIO test strip Use to check blood sugar up to twice a day.   rosuvastatin (CRESTOR) 20 MG tablet Take 1 tablet (20  mg total) by mouth at bedtime.   traMADol (ULTRAM) 50 MG tablet Take 1 tablet (50 mg total) by mouth every 8 (eight) hours as needed.     Allergies:   Leflunomide, Methotrexate derivatives, and Other   Social History   Socioeconomic History   Marital status: Divorced    Spouse name: Not on file   Number of children: Not on file   Years of education: Not on file   Highest education level: Not on file  Occupational History   Occupation: disability  Tobacco Use   Smoking status: Every Day    Packs/day: 1.00    Years: 8.00    Pack years: 8.00    Types: Cigarettes   Smokeless tobacco: Never  Vaping Use   Vaping Use: Never used   Substance and Sexual Activity   Alcohol use: Yes    Alcohol/week: 2.0 standard drinks    Types: 2 Standard drinks or equivalent per week    Comment: Drinks mixed drinks every evening.   Drug use: Not Currently   Sexual activity: Not on file  Other Topics Concern   Not on file  Social History Narrative   Not on file   Social Determinants of Health   Financial Resource Strain: Low Risk    Difficulty of Paying Living Expenses: Not hard at all  Food Insecurity: No Food Insecurity   Worried About Running Out of Food in the Last Year: Never true   [place] in the Last Year: Never true  Transportation Needs: No Transportation Needs   Lack of Transportation (Medical): No   Lack of Transportation (Non-Medical): No  Physical Activity: Inactive   Days of Exercise per Week: 0 days   Minutes of Exercise per Session: 0 min  Stress: No Stress Concern Present   Feeling of Stress : Not at all  Social Connections: Not on file     Family History: The patient's family history includes Cancer in his mother; Diabetes in his father; Heart attack (age of onset: 42) in his father; Hyperlipidemia in his father; Hypertension in his brother and father.  ROS:   Please see the history of present illness.     All other systems reviewed and are negative.  EKGs/Labs/Other Studies Reviewed:    The following studies were reviewed today:  Cardiac catheterization 05/19/21   Prox Cx to Mid Cx lesion is 40% stenosed.   Mid Cx to Dist Cx lesion is 50% stenosed.   Mid LAD lesion is 30% stenosed.   1st Diag lesion is 50% stenosed.   Prox RCA to Mid RCA lesion is 100% stenosed.   1.  Significant one-vessel coronary artery disease with chronically occluded RCA stents with well-developed left-to-right collaterals.  There is moderate LAD and left circumflex disease. 2.  Left ventricular angiography was not performed.  EF was normal by echo.  Normal left ventricular end-diastolic pressure.    Recommendations: The occluded RCA is responsible for abnormal nuclear stress test.  However, the vessel has well-developed left-to-right collaterals and thus revascularization is not recommended. Recommend continuing aggressive medical therapy and smoking cessation.   Coronary Diagrams  Diagnostic Dominance: Right    Echo 05/19/21 1. Left ventricular ejection fraction, by estimation, is 60 to 65%. The  left ventricle has normal function. The left ventricle demonstrates  regional wall motion abnormalities (see scoring diagram/findings for  description). There is mild left ventricular   hypertrophy. Left ventricular diastolic parameters are consistent with  Grade I diastolic dysfunction (impaired  relaxation). There is mild  hypokinesis of the left ventricular, basal inferior wall. The average left  ventricular global longitudinal strain  is -20.8 %. The global longitudinal strain is normal.   2. Right ventricular systolic function is normal. The right ventricular  size is normal. There is normal pulmonary artery systolic pressure.   3. Left atrial size was mildly dilated.   4. The mitral valve is normal in structure. No evidence of mitral valve  regurgitation. No evidence of mitral stenosis.   5. The aortic valve is normal in structure. Aortic valve regurgitation is  not visualized. No aortic stenosis is present.   6. Aortic dilatation noted. There is mild dilatation of the aortic root,  measuring 40 mm.   7. The inferior vena cava is normal in size with greater than 50%  respiratory variability, suggesting right atrial pressure of 3 mmHg.   EKG:  EKG is  ordered today.  The ekg ordered today demonstrates NSR, 74bpm, TWI III, no new changes  Recent Labs: 05/01/2021: ALT 17; B Natriuretic Peptide 60.5 05/18/2021: BUN 18; Creatinine, Ser 0.90; Potassium 3.7; Sodium 132 05/19/2021: Hemoglobin 14.8; Platelets 289  Recent Lipid Panel    Component Value Date/Time   CHOL 244 (H)  05/19/2021 0112   TRIG 112 05/19/2021 0112   HDL 106 05/19/2021 0112   CHOLHDL 2.3 05/19/2021 0112   VLDL 22 05/19/2021 0112   LDLCALC 116 (H) 05/19/2021 0112   LDLCALC 69 03/20/2021 1110    Physical Exam:    VS:  BP (!) 140/100 (BP Location: Left Arm, Patient Position: Sitting, Cuff Size: Normal)   Pulse 74   Ht 6' (1.829 m)   Wt 208 lb (94.3 kg)   SpO2 97%   BMI 28.21 kg/m     Wt Readings from Last 3 Encounters:  05/25/21 208 lb (94.3 kg)  05/19/21 218 lb (98.9 kg)  05/17/21 213 lb 13.5 oz (97 kg)     GEN:  Well nourished, well developed in no acute distress HEENT: Normal NECK: No JVD; No carotid bruits LYMPHATICS: No lymphadenopathy CARDIAC: RRR, no murmurs, rubs, gallops RESPIRATORY:  Clear to auscultation without rales, wheezing or rhonchi  ABDOMEN: Soft, non-tender, non-distended MUSCULOSKELETAL:  No edema; No deformity  SKIN: Warm and dry NEUROLOGIC:  Alert and oriented x 3 PSYCHIATRIC:  Normal affect   ASSESSMENT:    1. Coronary artery disease involving native coronary artery of native heart without angina pectoris   2. Essential hypertension   3. Hyperlipidemia, mixed   4. Tobacco abuse   5. Alcohol use   6. Type 2 diabetes mellitus without complication, with long-term current use of insulin (HCC)   7. Other chest pain   8. Dilated aortic root (HCC)    PLAN:    In order of problems listed above:  CAD with remote stenting Recent admission for chest pain and shortness of breath. Myoview stress test was abnormal with reversible defect. LHC showed chronically occluded RCA stent with collateralization.  Per cardiology this is the likely source of the abnormal stress test and resultant chest pain.  No PCI intervention completed.  Recommend aggressive medical management. Echo showed LVEF 60-65%, mild LVH, G1DD, mildly dilated LA, no MR, mild dilation of aortic root measuring 77m. Patient was sent home on Coreg, HCTZ, lisinopril, Zetia. Since discharge he has  been experiencing the same symptoms. HE has chest pain, SOB and nausea. Also feels anxious. He also had a question of seeing cardiothoracic surgeon,  but cath films were  reviewed by Dr. Fletcher Anon who said he does not need bypass surgery. Lifestyle changes encouraged. Will refer to cardiac rehab. I will add Imdur 22m daily. Continue Plavix (h/o stroke), statin, Zetia, Coreg.   HTN BP mildly elevated. Add Imdur 344mas above. Continue Coreg 12.75m68maily, lisinopril 57m51mily.   HLD LDL 116. Continue Crestor and Zetia. Can re-check lipid panel at follow-up.    H/o stroke Continue Plavix, statin, and Zetia  DM2 Recent A1C 6.6  Tobacco and alcohol use He is still smoking and drinking, he drinks 1 pint of Vodka every night. Complete cessation encouraged.  Mildly dilated aortic root Echo showed mild dilation of aortic root measuring 57mm87mntinue BP management.  Disposition: Follow up in 1 month(s) with MD/APP   Signed, Athan Casalino H FurNinfa MeekerC  05/25/2021 9:39 AM    [facility]

## 2021-05-24 ENCOUNTER — Ambulatory Visit (INDEPENDENT_AMBULATORY_CARE_PROVIDER_SITE_OTHER): Payer: Medicare Other | Admitting: Pharmacist

## 2021-05-24 DIAGNOSIS — E785 Hyperlipidemia, unspecified: Secondary | ICD-10-CM

## 2021-05-24 DIAGNOSIS — E1169 Type 2 diabetes mellitus with other specified complication: Secondary | ICD-10-CM

## 2021-05-24 DIAGNOSIS — I10 Essential (primary) hypertension: Secondary | ICD-10-CM

## 2021-05-24 DIAGNOSIS — E1159 Type 2 diabetes mellitus with other circulatory complications: Secondary | ICD-10-CM

## 2021-05-24 NOTE — Patient Instructions (Signed)
Visit Information  PATIENT GOALS:  Goals Addressed             This Visit's Progress    Pharmacy Goals       Please follow up with your health plan over the counter benefit about obtaining an upper arm blood pressure monitor and nicotine patches  Our goal bad cholesterol, or LDL, is less than 70 . This is why it is important to continue taking your rosuvastatin and ezetimibe   Feel free to call me with any questions or concerns. I look forward to our next call!  Wallace Cullens, PharmD, Para [place], CPP Clinical Pharmacist [facility] [phone]        The patient verbalized understanding of instructions, educational materials, and care plan provided today and declined offer to receive copy of patient instructions, educational materials, and care plan.   Telephone follow up appointment with care management team member scheduled for: 06/19/2021 at 12:30 PM

## 2021-05-24 NOTE — Chronic Care Management (AMB) (Signed)
Chronic Care Management Pharmacy Note  05/24/2021 Name:  Tyler Martinez MRN:  [id number] DOB:  1970/10/24   Subjective: Tyler Martinez is an 50 y.o. year old male who is a primary patient of Olin Hauser, DO.  The CCM team was consulted for assistance with disease management and care coordination needs.    Engaged with patient by telephone for follow up visit in response to provider referral for pharmacy case management and/or care coordination services.   Consent to Services:  The patient was given information about Chronic Care Management services, agreed to services, and gave verbal consent prior to initiation of services.  Please see initial visit note for detailed documentation.   Patient Care Team: Olin Hauser, DO as PCP - General (Family Medicine) Curley Spice, Virl Diamond, RPH-CPP as Pharmacist (Pharmacist)  Hospital visits: Medication Reconciliation was completed by comparing discharge summary, patient's EMR and Pharmacy list, and upon discussion with patient.  Patient admitted to [facility] from the ER from 9/22 to 9/24 for recurrent chest pain discomfort  New?Medications STARTED at [facility]:?? ezetimibe 10 MG tablet by mouth daily lisinopril 40 MG tablet by mouth daily nicotine 21 mg/24hr patch onto the skin daily  Medication CHANGED at Hospital Discharge: carvedilol 12.5 MG tablet by mouth 2 (two) times daily with a meal. rosuvastatin 20 MG tablet by mouth at bedtime  Medications DISCONTINUED at Hospital Discharge: lisinopril-hydrochlorothiazide 20-12.5 MG tablet  Medications that remain the same after Hospital Discharge:??  -All other medications will remain the same.    Objective:  Lab Results  Component Value Date   CREATININE 0.90 05/18/2021   CREATININE 0.80 05/17/2021   CREATININE 0.99 05/01/2021    Lab Results  Component Value Date   HGBA1C 6.8 (H) 05/18/2021   Last diabetic Eye exam: No results  found for: HMDIABEYEEXA  Last diabetic Foot exam: No results found for: HMDIABFOOTEX      Component Value Date/Time   CHOL 244 (H) 05/19/2021 0112   TRIG 112 05/19/2021 0112   HDL 106 05/19/2021 0112   CHOLHDL 2.3 05/19/2021 0112   VLDL 22 05/19/2021 0112   LDLCALC 116 (H) 05/19/2021 0112   LDLCALC 69 03/20/2021 1110    Hepatic Function Latest Ref Rng & Units 05/01/2021 03/20/2021 09/11/2018  Total Protein 6.5 - 8.1 g/dL 6.2(L) 6.0(L) 6.6  Albumin 3.5 - 5.0 g/dL 3.9 - 4.3  AST 15 - 41 U/L 21 22 42(H)  ALT 0 - 44 U/L _0 Alk Phosphatase 38 - 126 U/L 54 - 71  Total Bilirubin 0.3 - 1.2 mg/dL 0.5 0.4 0.6    Clinical ASCVD: Yes  The ASCVD Risk score (Arnett DK, et al., 2019) failed to calculate for the following reasons:   The patient has a prior MI or stroke diagnosis     Social History   Tobacco Use  Smoking Status Every Day   Packs/day: 1.00   Years: 8.00   Pack years: 8.00   Types: Cigarettes  Smokeless Tobacco Never   BP Readings from Last 3 Encounters:  05/20/21 (!) 149/109  05/17/21 (!) 153/103  05/03/21 140/90   Pulse Readings from Last 3 Encounters:  05/20/21 74  05/17/21 83  05/03/21 79   Wt Readings from Last 3 Encounters:  05/19/21 218 lb (98.9 kg)  05/17/21 213 lb 13.5 oz (97 kg)  05/03/21 214 lb 8 oz (97.3 kg)    Assessment: Review of patient past medical history, allergies, medications, health status, including review  of consultants reports, laboratory and other test data, was performed as part of comprehensive evaluation and provision of chronic care management services.   SDOH:  (Social Determinants of Health) assessments and interventions performed: yes SDOH Interventions    Flowsheet Row Most Recent Value  SDOH Interventions   SDOH Interventions for the Following Domains Tobacco  Tobacco Interventions Other (Comment)  [Encourage smoking cessation]       CCM Care Plan  Allergies  Allergen Reactions   Leflunomide Nausea And Vomiting    Methotrexate Derivatives Nausea And Vomiting    Have RA but can not tolerate any  Of the med  No components found with this name: PROTEIN24HR saw Rheumatology in 2005   Other Nausea And Vomiting    Have RA but can not tolerate any  Of the med  No components found with this name: PROTEIN24HR saw Rheumatology in 2005    Medications Reviewed Today     Reviewed by Rennis Petty, RPH-CPP (Pharmacist) on 05/24/21 at 1134  Med List Status: <None>   Medication Order Taking? Sig Documenting Provider Last Dose Status Informant  Blood Glucose Monitoring Suppl (ONETOUCH VERIO) w/Device KIT [id number]  Use to check blood sugar up to twice a day. Olin Hauser, DO  Active Self  carvedilol (COREG) 12.5 MG tablet [id number] Yes Take 1 tablet (12.5 mg total) by mouth 2 (two) times daily with a meal. Priscella Mann, Sudheer B, MD Taking Active   clopidogrel (PLAVIX) 75 MG tablet [id number] Yes Take 1 tablet (75 mg total) by mouth daily. Olin Hauser, DO Taking Active Self  ezetimibe (ZETIA) 10 MG tablet [id number] Yes Take 1 tablet (10 mg total) by mouth daily. Sidney Ace, MD Taking Active   gabapentin (NEURONTIN) 800 MG tablet [id number] Yes Take 1 tablet (800 mg total) by mouth 4 (four) times daily. Olin Hauser, DO Taking Active Self  Lancets [facility] ULTRASOFT) lancets [id number]  Use to check blood sugar up to twice a day. Olin Hauser, DO  Active Self  lisinopril (ZESTRIL) 40 MG tablet [id number] Yes Take 1 tablet (40 mg total) by mouth daily. Sidney Ace, MD Taking Active   metFORMIN (GLUCOPHAGE) 1000 MG tablet [id number] Yes Take 1 tablet (1,000 mg total) by mouth 2 (two) times daily with a meal. Karamalegos, Devonne Doughty, DO Taking Active Self  nicotine (NICODERM CQ - DOSED IN MG/24 HOURS) 21 mg/24hr patch [id number] No Place 1 patch (21 mg total) onto the skin daily.  Patient not taking: Reported on 05/24/2021   Sidney Ace, MD Not Taking  Active   [facility] VERIO test strip [id number]  Use to check blood sugar up to twice a day. Olin Hauser, DO  Active Self  rosuvastatin (CRESTOR) 20 MG tablet [id number] Yes Take 1 tablet (20 mg total) by mouth at bedtime. Sidney Ace, MD Taking Active   traMADol Veatrice Bourbon) 50 MG tablet [id number] Yes Take 1 tablet (50 mg total) by mouth every 8 (eight) hours as needed. Olin Hauser, DO Taking Active Self            Patient Active Problem List   Diagnosis Date Noted   Abnormal nuclear stress test    Chest pain 05/18/2021   CAD (coronary artery disease) 05/18/2021   Tobacco abuse 05/18/2021   History of substance abuse ([place]) 01/12/2020   Alcohol dependence ([place]) 01/12/2020   Coronary artery disease involving native coronary artery of native heart without angina pectoris 04/14/2019   Essential hypertension 04/14/2019  Hyperlipidemia associated with type 2 diabetes mellitus ([place]) 04/14/2019   History of cerebrovascular accident (CVA) with residual deficit 04/14/2019   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    Immunization History  Administered Date(s) Administered   Influenza, Seasonal, Injecte, Preservative Fre 06/21/2015   Influenza,inj,Quad PF,6+ Mos 07/29/2018, 06/03/2019   Influenza,inj,quad, With Preservative 08/02/2016   Influenza-Unspecified 05/30/2017   Pneumococcal Polysaccharide-23 08/02/2016    Conditions to be addressed/monitored: CAD, HTN, DMII, hx CVA  Care Plan : PharmD - Medication Management/Adherence  Updates made by Rennis Petty, RPH-CPP since 05/24/2021 12:00 AM     Problem: Disease Progression      Long-Range Goal: Disease  Progression Prevented or Minimized   Start Date: 03/31/2021  Expected End Date: 06/29/2021  This Visit's Progress: On track  Recent Progress: On track  Priority: High  Note:   Current Barriers:  Need for evaluation of medication adherence, as identified by patient's health plan, based on medication adherence quality data  Financial barriers  Pharmacist Clinical Goal(s):  Over the next 90 days, patient will achieve adherence to monitoring guidelines and medication adherence to achieve therapeutic efficacy through collaboration with PharmD and provider.    Interventions: 1:1 collaboration with Olin Hauser, DO regarding development and update of comprehensive plan of care as evidenced by provider attestation and co-signature Inter-disciplinary care team collaboration (see longitudinal plan of care) Perform chart review Patient seen in [facility] ED on 9/21 for shortness of breath Patient admitted to [facility] from the ER from 9/22 to 9/24 for recurrent chest pain discomfort Per discharging provider's note on 9/24: Results of catheterization demonstrates a chronically occluded RCA stent with collateralization Patient advised to: Follow up outpatient with PCP and Cardiology Make the following medication changes:  STOP taking: lisinopril-hydrochlorothiazide 20-12.5 MG tablet  START taking: ezetimibe 10 MG tablet by mouth daily lisinopril 40 MG tablet by mouth daily nicotine 21 mg/24hr patch onto the skin daily  CHANGE how taking: carvedilol 12.5 MG tablet by mouth 2 (two) times daily with a meal. rosuvastatin 20 MG tablet by mouth at bedtime Patient has an Office Visit with Cardiology scheduled for tomorrow, 9/29 Note patient missed hospitalization follow up appointment with PCP on 9/26 Comprehensive medication review performed; medication list updated in electronic medical record Today patient confirms planning to attend appointment with  Cardiology tomorrow Advise patient to call office to reschedule missed appointment with PCP  Diabetes/Medication Adherence: Controlled; current treatment: metformin 1000 mg twice daily with meals  Reports now that he is taking metformin consistently and with food, able to tolerate and take as directed  Encourage patient to start using weekly pillbox as well to aid with adherence  Hypertension: Current treatment: Carvedilol 12.5 mg twice daily Lisinopril 40 mg daily Confirms picked up and taking this new dose from PCP Denies monitoring home BP as currently does not have working monitor Encourage patient to obtain new upper arm BP monitor from health plan over the counter benefit Encourage patient to start using weekly pillbox as well to aid with adherence  Hyperlipidemia: Current treatment:  rosuvastatin 20 mg daily Ezetimibe 10 mg daily Encourage patient to take consistently  Tobacco Use: Encourage smoking cessation Reports currently smoking ~1 pack/day Reports  has been unable to pick up Rx for: nicotine 21 mg/24hr patch onto the skin daily Counsel patient on using OTC benefit to obtain nicotine patches  Patient Goals/Self-Care Activities Over the next 90 days, patient will:  - take medications as prescribed target a minimum of 150 minutes of moderate intensity exercise weekly  Follow Up Plan: Telephone follow up appointment with care management team member scheduled for: 06/19/2021 at 12:30 PM      Patient's preferred pharmacy is:  [id number] [id number]: [phone]: [phone] [id number] [place] [id number]: [phone]: [phone] Up:  Patient agrees to Care Plan and Follow-up.  ElWallace CullensPharmD, BCPara MarchCPP Clinical Pharmacist [phone]

## 2021-05-25 ENCOUNTER — Other Ambulatory Visit: Payer: Self-pay

## 2021-05-25 ENCOUNTER — Ambulatory Visit (INDEPENDENT_AMBULATORY_CARE_PROVIDER_SITE_OTHER): Payer: Medicare Other | Admitting: Medical

## 2021-05-25 ENCOUNTER — Encounter: Payer: Self-pay | Admitting: Medical

## 2021-05-25 VITALS — BP 140/100 | HR 74 | Ht 72.0 in | Wt 208.0 lb

## 2021-05-25 DIAGNOSIS — I77810 Thoracic aortic ectasia: Secondary | ICD-10-CM | POA: Diagnosis not present

## 2021-05-25 DIAGNOSIS — R0789 Other chest pain: Secondary | ICD-10-CM

## 2021-05-25 DIAGNOSIS — Z794 Long term (current) use of insulin: Secondary | ICD-10-CM

## 2021-05-25 DIAGNOSIS — E782 Mixed hyperlipidemia: Secondary | ICD-10-CM | POA: Diagnosis not present

## 2021-05-25 DIAGNOSIS — E119 Type 2 diabetes mellitus without complications: Secondary | ICD-10-CM | POA: Diagnosis not present

## 2021-05-25 DIAGNOSIS — F1090 Alcohol use, unspecified, uncomplicated: Secondary | ICD-10-CM

## 2021-05-25 DIAGNOSIS — I10 Essential (primary) hypertension: Secondary | ICD-10-CM | POA: Diagnosis not present

## 2021-05-25 DIAGNOSIS — Z720 Tobacco use: Secondary | ICD-10-CM

## 2021-05-25 DIAGNOSIS — I2510 Atherosclerotic heart disease of native coronary artery without angina pectoris: Secondary | ICD-10-CM | POA: Diagnosis not present

## 2021-05-25 DIAGNOSIS — Z7289 Other problems related to lifestyle: Secondary | ICD-10-CM

## 2021-05-25 DIAGNOSIS — Z789 Other specified health status: Secondary | ICD-10-CM

## 2021-05-25 MED ORDER — ISOSORBIDE MONONITRATE ER 30 MG PO TB24: 30.0000 mg | ORAL_TABLET | Freq: Every day | ORAL | 3 refills | Status: DC

## 2021-05-25 NOTE — Patient Instructions (Signed)
Medication Instructions:  Start Isosorbide (Imdur) 30 mg daily   *If you need a refill on your cardiac medications before your next appointment, please call your pharmacy*   Lab Work: None ordered   If you have labs (blood work) drawn today and your tests are completely normal, you will receive your results only by: [place] (if you have MyChart) OR A paper copy in the mail If you have any lab test that is abnormal or we need to change your treatment, we will call you to review the results.   Testing/Procedures: None ordered    Follow-Up: At [facility], you and your health needs are our priority.  As part of our continuing mission to provide you with exceptional heart care, we have created designated Provider Care Teams.  These Care Teams include your primary Cardiologist (physician) and Advanced Practice Providers (APPs -  Physician Assistants and Nurse Practitioners) who all work together to provide you with the care you need, when you need it.  We recommend signing up for the patient portal called "MyChart".  Sign up information is provided on this After Visit Summary.  MyChart is used to connect with patients for Virtual Visits (Telemedicine).  Patients are able to view lab/test results, encounter notes, upcoming appointments, etc.  Non-urgent messages can be sent to your provider as well.   To learn more about what you can do with MyChart, go to [email or web address].    Your next appointment:   3-4 week(s)  The format for your next appointment:   In Person  Provider:   You may see Dr. Ida Rogue or one of the following Advanced Practice Providers on your designated Care Team:   Murray Hodgkins, NP Christell Faith, PA-C Marrianne Mood, PA-C Cadence Kathlen Mody, [place]  Your physician has referred you to cardiac rehab    Other Instructions None

## 2021-05-26 DIAGNOSIS — E785 Hyperlipidemia, unspecified: Secondary | ICD-10-CM | POA: Diagnosis not present

## 2021-05-26 DIAGNOSIS — E1159 Type 2 diabetes mellitus with other circulatory complications: Secondary | ICD-10-CM | POA: Diagnosis not present

## 2021-05-26 DIAGNOSIS — E1169 Type 2 diabetes mellitus with other specified complication: Secondary | ICD-10-CM | POA: Diagnosis not present

## 2021-05-26 DIAGNOSIS — I10 Essential (primary) hypertension: Secondary | ICD-10-CM | POA: Diagnosis not present

## 2021-05-31 ENCOUNTER — Ambulatory Visit: Payer: Self-pay

## 2021-05-31 NOTE — Telephone Encounter (Signed)
The pt was scheduled for Monday at 10:20am for a pain in his heel that's been going on for x 1 mth.
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
## 2021-06-01 ENCOUNTER — Other Ambulatory Visit: Payer: Medicare Other

## 2021-06-05 ENCOUNTER — Ambulatory Visit: Payer: Medicare Other | Admitting: Medical

## 2021-06-05 ENCOUNTER — Other Ambulatory Visit: Payer: Self-pay

## 2021-06-05 ENCOUNTER — Ambulatory Visit (INDEPENDENT_AMBULATORY_CARE_PROVIDER_SITE_OTHER): Payer: Medicare Other | Admitting: Family Medicine

## 2021-06-05 ENCOUNTER — Encounter: Payer: Self-pay | Admitting: Family Medicine

## 2021-06-05 VITALS — BP 172/113 | HR 76 | Ht 72.0 in | Wt 224.2 lb

## 2021-06-05 DIAGNOSIS — G8929 Other chronic pain: Secondary | ICD-10-CM

## 2021-06-05 DIAGNOSIS — M25512 Pain in left shoulder: Secondary | ICD-10-CM | POA: Diagnosis not present

## 2021-06-05 DIAGNOSIS — G894 Chronic pain syndrome: Secondary | ICD-10-CM | POA: Diagnosis not present

## 2021-06-05 DIAGNOSIS — M25511 Pain in right shoulder: Secondary | ICD-10-CM | POA: Diagnosis not present

## 2021-06-05 DIAGNOSIS — M722 Plantar fascial fibromatosis: Secondary | ICD-10-CM | POA: Diagnosis not present

## 2021-06-05 DIAGNOSIS — M25312 Other instability, left shoulder: Secondary | ICD-10-CM

## 2021-06-05 DIAGNOSIS — M25311 Other instability, right shoulder: Secondary | ICD-10-CM

## 2021-06-05 DIAGNOSIS — F411 Generalized anxiety disorder: Secondary | ICD-10-CM

## 2021-06-05 DIAGNOSIS — F410 Panic disorder [episodic paroxysmal anxiety] without agoraphobia: Secondary | ICD-10-CM

## 2021-06-05 MED ORDER — HYDROXYZINE PAMOATE 25 MG PO CAPS: 25.0000 mg | ORAL_CAPSULE | Freq: Three times a day (TID) | ORAL | 2 refills | Status: DC | PRN

## 2021-06-05 MED ORDER — TRAMADOL HCL 50 MG PO TABS: 50.0000 mg | ORAL_TABLET | Freq: Three times a day (TID) | ORAL | 2 refills | Status: DC | PRN

## 2021-06-05 MED ORDER — PREDNISONE 20 MG PO TABS: ORAL_TABLET | ORAL | 0 refills | Status: DC

## 2021-06-05 NOTE — Progress Notes (Signed)
Subjective:    Patient ID: Tyler Martinez, male    DOB: July 26, 1971, 50 y.o.   MRN: [id number]  Tyler Martinez is a 50 y.o. male presenting on 06/05/2021 for Foot Pain   [place] VISIT  Hospital/Location: [facility] Date of Admission: 05/18/21 Date of Discharge: 05/20/21 Transitions of care telephone call: 05/22/21  Reason for Admission: Chest Pain.  [facility] H&P and Discharge Summary have been reviewed - Patient presents today about 14 days after recent hospitalization. Brief summary of recent course, patient had symptoms of chest pain, hospitalized, treated with cardiology management.  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 He had ECHO as well. Discharged on coreg, plavix, statin. Dose inc on coreg. He was still having chest pain on discharge and anxiety in cardiology office on 05/25/21. They added Imdur 30mg  daily.  Pharmacy Tramadol refills smaller pill count  Right Heel Pain Plantar Fasciitis Onset 1-2 months ago, no new injury. He has had prior injury with hardware prior R foot surgery. He has pain initially then some improve but worse prolong on feet.  Has not seen Podiatry but has had injection in heel before with relief  Anxiety / Panic Attacks Asking for Ativan. Has failed multiples medications Seroquel, Xanax, Cymbalta Duloxetine, Escitalopram Lexapro.  I have reviewed the discharge medication list, and have reconciled the current and discharge medications today.   Depression screen [facility] 2/9 06/05/2021 04/04/2021 03/20/2021  Decreased Interest 3 1 1   Down, Depressed, Hopeless 1 1 0  PHQ - 2 Score 4 2 1   Altered sleeping 0 1 1  Tired, decreased energy 2 1 1   Change in appetite 0 1 1  Feeling bad or failure about yourself  0 0 0  Trouble concentrating 1 1 1   Moving slowly or fidgety/restless 2 1 1   Suicidal thoughts  0 0 0  PHQ-9 Score 9 7 6   Difficult doing work/chores Extremely dIfficult Somewhat difficult Somewhat difficult    Social History   Tobacco Use   Smoking status: Every Day    Packs/day: 1.00    Years: 8.00    Pack years: 8.00    Types: Cigarettes   Smokeless tobacco: Never  Vaping Use   Vaping Use: Never used  Substance Use Topics   Alcohol use: Yes    Alcohol/week: 2.0 standard drinks    Types: 2 Standard drinks or equivalent per week    Comment: Drinks mixed drinks every evening.   Drug use: Not Currently    Review of Systems Per HPI unless specifically indicated above     Objective:    BP (!) 172/113   Pulse 76   Ht 6' (1.829 m)   Wt 224 lb 3.2 oz (101.7 kg)   SpO2 98%   BMI 30.41 kg/m   Wt Readings from Last 3 Encounters:  06/05/21 224 lb 3.2 oz (101.7 kg)  05/25/21 208 lb (94.3 kg)  05/19/21 218 lb (98.9 kg)    Physical Exam Vitals and nursing note reviewed.  Constitutional:      General: He is not in acute distress.    Appearance: He is well-developed. He is not diaphoretic.     Comments: Well-appearing, comfortable, cooperative  HENT:     Head: Normocephalic and atraumatic.  Eyes:     General:        Right eye: No discharge.  Left eye: No discharge.     Conjunctiva/sclera: Conjunctivae normal.  Neck:     Thyroid: No thyromegaly.  Cardiovascular:     Rate and Rhythm: Normal rate and regular rhythm.     Pulses: Normal pulses.     Heart sounds: Normal heart sounds. No murmur heard. Pulmonary:     Effort: Pulmonary effort is normal. No respiratory distress.     Breath sounds: Normal breath sounds. No wheezing or rales.  Musculoskeletal:        General: Normal range of motion.     Cervical back: Normal range of motion and neck supple.  Lymphadenopathy:     Cervical: No cervical adenopathy.  Skin:    General: Skin is warm and dry.     Findings: No erythema or rash.  Neurological:     Mental Status: He is alert and oriented to person,  place, and time. Mental status is at baseline.  Psychiatric:        Behavior: Behavior normal.     Comments: Well groomed, good eye contact, normal speech and thoughts     Results for orders placed or performed during the hospital encounter of 05/18/21  Resp Panel by RT-PCR (Flu A&B, Covid) Nasopharyngeal Swab   Specimen: Nasopharyngeal Swab; Nasopharyngeal(NP) swabs in vial transport medium  Result Value Ref Range   SARS Coronavirus 2 by RT PCR NEGATIVE NEGATIVE   Influenza A by PCR NEGATIVE NEGATIVE   Influenza B by PCR NEGATIVE NEGATIVE  NM Myocar Multi W/Spect W/Wall Motion / EF  Result Value Ref Range   Rest Nuclear Isotope Dose 9.7 mCi   Stress Nuclear Isotope Dose 31.1 mCi   SSS 8.0    SRS 3.0    SDS 5.0    TID 1.00    LV sys vol 44.0 mL   LV dias vol 111.0 62 - 150 mL   Nuc Stress EF 60 %   ST Depression (mm) 0.5 mm  Basic metabolic panel  Result Value Ref Range   Sodium 132 (L) 135 - 145 mmol/L   Potassium 3.7 3.5 - 5.1 mmol/L   Chloride 92 (L) 98 - 111 mmol/L   CO2 30 22 - 32 mmol/L   Glucose, Bld 180 (H) 70 - 99 mg/dL   BUN 18 6 - 20 mg/dL   Creatinine, Ser 0.90 0.61 - 1.24 mg/dL   Calcium 9.7 8.9 - 10.3 mg/dL   GFR, Estimated >60 >60 mL/min   Anion gap 10 5 - 15  CBC  Result Value Ref Range   WBC 11.4 (H) 4.0 - 10.5 K/uL   RBC 4.15 (L) 4.22 - 5.81 MIL/uL   Hemoglobin 15.2 13.0 - 17.0 g/dL   HCT 40.7 39.0 - 52.0 %   MCV 98.1 80.0 - 100.0 fL   MCH 36.6 (H) 26.0 - 34.0 pg   MCHC 37.3 (H) 30.0 - 36.0 g/dL   RDW 13.7 11.5 - 15.5 %   Platelets 287 150 - 400 K/uL   nRBC 0.0 0.0 - 0.2 %  Hemoglobin A1c  Result Value Ref Range   Hgb A1c MFr Bld 6.8 (H) 4.8 - 5.6 %   Mean Plasma Glucose 148 mg/dL  Lipid panel  Result Value Ref Range   Cholesterol 244 (H) 0 - 200 mg/dL   Triglycerides 112 <150 mg/dL   HDL 106 >40 mg/dL   Total CHOL/HDL Ratio 2.3 RATIO   VLDL 22 0 - 40 mg/dL   LDL Cholesterol 116 (H) 0 - 99 mg/dL  Protime-INR  Result Value Ref Range    Prothrombin Time 12.2 11.4 - 15.2 seconds   INR 0.9 0.8 - 1.2  APTT  Result Value Ref Range   aPTT 26 24 - 36 seconds  HIV Antibody (routine testing w rflx)  Result Value Ref Range   HIV Screen 4th Generation wRfx Non Reactive Non Reactive  CBC  Result Value Ref Range   WBC 7.7 4.0 - 10.5 K/uL   RBC 4.14 (L) 4.22 - 5.81 MIL/uL   Hemoglobin 14.8 13.0 - 17.0 g/dL   HCT 41.3 39.0 - 52.0 %   MCV 99.8 80.0 - 100.0 fL   MCH 35.7 (H) 26.0 - 34.0 pg   MCHC 35.8 30.0 - 36.0 g/dL   RDW 13.7 11.5 - 15.5 %   Platelets 289 150 - 400 K/uL   nRBC 0.0 0.0 - 0.2 %  Heparin level (unfractionated)  Result Value Ref Range   Heparin Unfractionated <0.10 (L) 0.30 - 0.70 IU/mL  Glucose, capillary  Result Value Ref Range   Glucose-Capillary 461 (H) 70 - 99 mg/dL  Glucose, capillary  Result Value Ref Range   Glucose-Capillary 386 (H) 70 - 99 mg/dL  Glucose, capillary  Result Value Ref Range   Glucose-Capillary 195 (H) 70 - 99 mg/dL  Glucose, capillary  Result Value Ref Range   Glucose-Capillary 167 (H) 70 - 99 mg/dL  Glucose, capillary  Result Value Ref Range   Glucose-Capillary 219 (H) 70 - 99 mg/dL  Glucose, capillary  Result Value Ref Range   Glucose-Capillary 163 (H) 70 - 99 mg/dL  Glucose, capillary  Result Value Ref Range   Glucose-Capillary 155 (H) 70 - 99 mg/dL  Glucose, capillary  Result Value Ref Range   Glucose-Capillary 154 (H) 70 - 99 mg/dL  CBG monitoring, ED  Result Value Ref Range   Glucose-Capillary 177 (H) 70 - 99 mg/dL   Comment 1 Document in Chart   ECHOCARDIOGRAM COMPLETE  Result Value Ref Range   Weight 3,421.54 oz   Height 72 in   BP 140/93 mmHg   Ao pk vel 1.45 m/s   AV Area VTI 3.48 cm2   AR max vel 3.22 cm2   AV Mean grad 5.3 mmHg   AV Peak grad 8.4 mmHg   S' Lateral 2.60 cm   AV Area mean vel 2.85 cm2   Area-P 1/2 3.27 cm2  Troponin I (High Sensitivity)  Result Value Ref Range   Troponin I (High Sensitivity) 16 <18 ng/L  Troponin I (High  Sensitivity)  Result Value Ref Range   Troponin I (High Sensitivity) 17 <18 ng/L      Assessment & Plan:   Problem List Items Addressed This Visit   None Visit Diagnoses     Plantar fasciitis of right foot    -  Primary   Relevant Medications   predniSONE (DELTASONE) 20 MG tablet   Chronic right shoulder pain       Relevant Medications   predniSONE (DELTASONE) 20 MG tablet   traMADol (ULTRAM) 50 MG tablet   Rotator cuff insufficiency of right shoulder       Relevant Medications   traMADol (ULTRAM) 50 MG tablet   Chronic pain syndrome       Relevant Medications   predniSONE (DELTASONE) 20 MG tablet   traMADol (ULTRAM) 50 MG tablet   Chronic left shoulder pain       Relevant Medications   predniSONE (DELTASONE) 20 MG tablet   traMADol (ULTRAM) 50 MG  tablet   Rotator cuff insufficiency of left shoulder       Relevant Medications   traMADol (ULTRAM) 50 MG tablet   Generalized anxiety disorder with panic attacks       Relevant Medications   hydrOXYzine (VISTARIL) 25 MG capsule       Chronic Pain w/ Shoulders Chronic pain syndrome R Foot heel pain plantar fasciitis Reviewed PDMP Refill Tramadol PRN use For PLantar fascia will order Prednisone course Tension night Splint and exercises recommended May refer to Podiatry if needed  Recent ED hospitalization Chest pain reviewed Still has elevated BP Discussed he needs to adhere to rx medications, he was missing Carvedilol dosing in AM. Caution with smoking 1ppd and high alcohol intake raising BP and contributing also pain raising BP today as well.  Anxiety contributing to HTN likely Will order Hydroxyzine 25mg  PRN use for acute panic anxiety Discussed in hospital okay to have Ativan BDZ but would not be able to do longer term rx for this type of medication for anxiety. Consider SSRI  but he has had bad reaction to lexapro before - he has tried Buspar and failed in past. Reconsider other meds.  Meds ordered this encounter   Medications   predniSONE (DELTASONE) 20 MG tablet    Sig: Take daily with food. Start with 60mg  (3 pills) x 2 days, then reduce to 40mg  (2 pills) x 2 days, then 20mg  (1 pill) x 3 days    Dispense:  13 tablet    Refill:  0   traMADol (ULTRAM) 50 MG tablet    Sig: Take 1 tablet (50 mg total) by mouth every 8 (eight) hours as needed.    Dispense:  60 tablet    Refill:  2   hydrOXYzine (VISTARIL) 25 MG capsule    Sig: Take 1 capsule (25 mg total) by mouth every 8 (eight) hours as needed for anxiety.    Dispense:  45 capsule    Refill:  2     Follow up plan: Return in about 3 months (around 09/05/2021), or if symptoms worsen or fail to improve.   Nobie Putnam, DO [facility] 06/05/2021, 10:06 AM

## 2021-06-05 NOTE — Patient Instructions (Addendum)
Thank you for coming to the office today.  Refilled Tramadol, if pharmacy cannot give 60 pills it is an insurance coverage issue unfortunately they can parcel it out.  Hydroxyzine for anxiety as needed.  Reiminder to take the Carvedilol twice per day  You most likely have Plantar Fasciitis of heel / foot. - This is inflammation of the fibrous connection on the bottom of the foot, and can have small micro tears over time that become painful. It usually will have flare ups lasting days to weeks, and may come back after it heals if it is re-aggravated again. - Often there is a bone spur or arthritis of the heel bone that causes this - Also it may be caused by abnormal footwear, walking pattern or other problems  If you are experiencing an acute flare with pain, this is usually worst first thing in the morning when the plantar fascia is tight and stiff. First step out of bed is painful usually, and it may gradually improve with stretching and walking.  Try a Tension Night Splint to wear when you sleep to stretch out the foot.  Recommend trial of Anti-inflammatory with Naproxen (Naprosyn) 500mg  tabs - take one with food and plenty of water TWICE daily every day (breakfast and dinner), for next 2 to 4 weeks, then you may take only as needed - DO NOT TAKE any ibuprofen, aleve, motrin while you are taking this medicine - It is safe to take Tylenol Ext Str 500mg  tabs - take 1 to 2 (max dose 1000mg ) every 6 hours as needed for breakthrough pain, max 24 hour daily dose is 6 to 8 tablets or 4000mg   Recommend: - Rest / relative rest with activity modification avoid overuse / prolonged stand - Ice packs (make sure you use a towel or sock / something to protect skin)  May also try topical muscle rub, icy hot, tiger balm  Start the exercises listed below, gradually increase them as instructed. May be sore at first and hopefully will stretch out and help reduce pain later.  If not improving after 1-2  weeks on medicine, and stretching exercises and some rest - then can contact our office again for re-evaluation may consider other causes or can refer you to Orthopedic specialist to have second opinion, and consider a steroid injection.    Please schedule a Follow-up Appointment to: Return in about 3 months (around 09/05/2021), or if symptoms worsen or fail to improve.  If you have any other questions or concerns, please feel free to call the office or send a message through [place]. You may also schedule an earlier appointment if necessary.  Additionally, you may be receiving a survey about your experience at our office within a few days to 1 week by e-mail or mail. We value your feedback.  Tyler Putnam, DO [place]

## 2021-06-12 ENCOUNTER — Other Ambulatory Visit: Payer: Self-pay | Admitting: Family Medicine

## 2021-06-12 DIAGNOSIS — F410 Panic disorder [episodic paroxysmal anxiety] without agoraphobia: Secondary | ICD-10-CM

## 2021-06-12 DIAGNOSIS — F411 Generalized anxiety disorder: Secondary | ICD-10-CM

## 2021-06-12 NOTE — Telephone Encounter (Signed)
Requested medication (s) are due for refill today - no  Requested medication (s) are on the active medication list -yes  Future visit scheduled -yes  Last refill: 06/05/21 #45 2RF  Notes to clinic: Pharmacy request changes to original Rx: request 90 day Rx, Dx code  Requested Prescriptions  Pending Prescriptions Disp Refills   hydrOXYzine (VISTARIL) 25 MG capsule [Pharmacy Med Name: HYDROXYZINE PAM 25 MG CAP] 270 capsule 1    Sig: Take 1 capsule (25 mg total) by mouth every 8 (eight) hours as needed for anxiety.     Ear, Nose, and Throat:  Antihistamines Passed - 06/12/2021  8:30 AM      Passed - Valid encounter within last 12 months    Recent Outpatient Visits           1 week ago Plantar fasciitis of right foot   [place], DO   2 months ago Acute bilateral low back pain with left-sided sciatica   [place], DO   2 months ago Essential hypertension   [place], DO   4 months ago Essential hypertension   [facility] [place], Devonne Doughty, DO   1 year ago Type 2 diabetes mellitus with vascular disease ([place])   [place], DO       Future Appointments             In 1 week Fur[place], Cadence H, PA-C [place], [facility]   In 1 month  [facility], [facility]               Requested Prescriptions  Pending Prescriptions Disp Refills   hydrOXYzine (VISTARIL) 25 MG capsule [Pharmacy Med Name: HYDROXYZINE PAM 25 MG CAP] 270 capsule 1    Sig: Take 1 capsule (25 mg total) by mouth every 8 (eight) hours as needed for anxiety.     Ear, Nose, and Throat:  Antihistamines Passed - 06/12/2021  8:30 AM      Passed - Valid encounter within last 12 months    Recent Outpatient Visits           1 week ago Plantar fasciitis of right foot   [place], DO   2 months ago Acute bilateral low back pain with left-sided sciatica   [place], DO   2 months ago Essential hypertension   [place], DO   4 months ago Essential hypertension   [place], DO   1 year ago Type 2 diabetes mellitus with vascular disease [facility])   [place], Devonne Doughty, DO       Future Appointments             In 1 week Furth, [place], PA-C [place], [facility]   In 1 month  [facility], [place]

## 2021-06-19 ENCOUNTER — Telehealth: Payer: Self-pay | Admitting: Pharmacist

## 2021-06-19 ENCOUNTER — Telehealth: Payer: Medicare Other

## 2021-06-19 NOTE — Telephone Encounter (Signed)
  Chronic Care Management   Outreach Note  06/19/2021 Name: Tyler Martinez MRN: [id number] DOB: 02/20/1971  Referred by: Olin Hauser, DO Reason for referral : No chief complaint on file.   Was unable to reach patient via telephone today and unable to leave a message as voicemail is not setup.   Follow Up Plan: Will attempt to reach patient by telephone again within the next 2 weeks  Harlow Asa, PharmD, [place] [facility] [phone]

## 2021-06-20 ENCOUNTER — Encounter (HOSPITAL_COMMUNITY): Payer: Self-pay | Admitting: Radiology

## 2021-06-22 NOTE — Progress Notes (Deleted)
Cardiology Office Note:    Date:  06/22/2021   ID:  [name], DOB [birth year], MRN [id number]  PCP:  Olin Hauser, DO  [facility] Cardiologist:  None  [facility] Electrophysiologist:  None   Referring MD: Nobie Putnam *   Chief Complaint: 4 week follow-up  History of Present Illness:    Tyler Martinez is a 50 y.o. male with a hx of with a hx of HTN, DM2, alcohol abuse, tobacco use, CAD with prior stenting in 2008 and 2012 in [place], TMI, [place] 2005 who is being seen for chest pain.    Admitted 9/22-9/24 with chest pain and SOB. HS trop negative. EKG with TWI lateral leads. Myoview Lexiscan was abnormal with reversible defect so he was set up for LHC. LHC showed significant one vessel CAD with chronically occluded RCA stents with well-developed L>R collaterals, moderate LAD and left Cx disease, normal LVEDP. Due to collaterals PCI was not performed. Echo showed LVEF 60-65%, mild LVH, G1DD, mildly dilated LA, no MR, mild dilation of aortic root measuring 32mm. He was discharged on coreg, plavix, statin.   Last seen 05/25/21 and was not feeling well since discharge. He was referred to cardac rehab and started on Imdur. Still drinking alcohol and smoking.   Today,   Past Medical History:  Diagnosis Date   Anxiety    Arthritis    Cardiac arrest ([place]) 2008   Coronary artery disease    Heart attack ([employer])    Hyperlipidemia    Hypertension    MI (myocardial infarction) ([place])    Stroke [facility])     Past Surgical History:  Procedure Laterality Date   CAROTID STENT  2008   EXPLORATION POST OPERATIVE OPEN HEART  2008   HERNIA REPAIR  2008   inguinal   LEFT HEART CATH AND CORONARY ANGIOGRAPHY N/A 05/19/2021   Procedure: LEFT HEART CATH AND CORONARY ANGIOGRAPHY;  Surgeon: Wellington Hampshire, MD;  Location: [place] [facility];  Service: Cardiovascular;  Laterality: N/A;    Current Medications: No outpatient medications have been marked as taking for the 06/23/21  encounter (Appointment) with Kathlen Mody, Octavius Shin H, PA-C.     Allergies:   Leflunomide, Methotrexate derivatives, and Other   Social History   Socioeconomic History   Marital status: Divorced    Spouse name: Not on file   Number of children: Not on file   Years of education: Not on file   Highest education level: Not on file  Occupational History   Occupation: disability  Tobacco Use   Smoking status: Every Day    Packs/day: 1.00    Years: 8.00    Pack years: 8.00    Types: Cigarettes   Smokeless tobacco: Never  Vaping Use   Vaping Use: Never used  Substance and Sexual Activity   Alcohol use: Yes    Alcohol/week: 2.0 standard drinks    Types: 2 Standard drinks or equivalent per week    Comment: Drinks mixed drinks every evening.   Drug use: Not Currently   Sexual activity: Not on file  Other Topics Concern   Not on file  Social History Narrative   Not on file   Social Determinants of Health   Financial Resource Strain: Low Risk    Difficulty of Paying Living Expenses: Not hard at all  Food Insecurity: No Food Insecurity   Worried About [place] in the Last Year: Never true   [place] in the Last Year: Never  true  Transportation Needs: No Transportation Needs   Lack of Transportation (Medical): No   Lack of Transportation (Non-Medical): No  Physical Activity: Inactive   Days of Exercise per Week: 0 days   Minutes of Exercise per Session: 0 min  Stress: No Stress Concern Present   Feeling of Stress : Not at all  Social Connections: Not on file     Family History: The patient's ***family history includes Cancer in his mother; Diabetes in his father; Heart attack (age of onset: 62) in his father; Hyperlipidemia in his father; Hypertension in his brother and father.  ROS:   Please see the history of present illness.    *** All other systems reviewed and are negative.  EKGs/Labs/Other Studies Reviewed:    The following studies were reviewed  today: ***  EKG:  EKG is *** ordered today.  The ekg ordered today demonstrates ***  Recent Labs: 05/01/2021: ALT 17; B Natriuretic Peptide 60.5 05/18/2021: BUN 18; Creatinine, Ser 0.90; Potassium 3.7; Sodium 132 05/19/2021: Hemoglobin 14.8; Platelets 289  Recent Lipid Panel    Component Value Date/Time   CHOL 244 (H) 05/19/2021 0112   TRIG 112 05/19/2021 0112   HDL 106 05/19/2021 0112   CHOLHDL 2.3 05/19/2021 0112   VLDL 22 05/19/2021 0112   LDLCALC 116 (H) 05/19/2021 0112   LDLCALC 69 03/20/2021 1110     Risk Assessment/Calculations:   {Does this patient have ATRIAL FIBRILLATION?:[phone]}   Physical Exam:    VS:  There were no vitals taken for this visit.    Wt Readings from Last 3 Encounters:  06/05/21 224 lb 3.2 oz (101.7 kg)  05/25/21 208 lb (94.3 kg)  05/19/21 218 lb (98.9 kg)     GEN: *** Well nourished, well developed in no acute distress HEENT: Normal NECK: No JVD; No carotid bruits LYMPHATICS: No lymphadenopathy CARDIAC: ***RRR, no murmurs, rubs, gallops RESPIRATORY:  Clear to auscultation without rales, wheezing or rhonchi  ABDOMEN: Soft, non-tender, non-distended MUSCULOSKELETAL:  No edema; No deformity  SKIN: Warm and dry NEUROLOGIC:  Alert and oriented x 3 PSYCHIATRIC:  Normal affect   ASSESSMENT:    No diagnosis found. PLAN:    In order of problems listed above:  ***  Disposition: Follow up {follow up:15908} with ***   Shared Decision Making/Informed Consent   {Are you ordering a CV Procedure (e.g. stress test, cath, DCCV, TEE, etc)?   Press F2        :[id number]}    Signed, Abrahan Fulmore Arlyss Repress  06/22/2021 8:37 PM    [facility]

## 2021-06-23 ENCOUNTER — Ambulatory Visit: Payer: Medicare Other | Admitting: Medical

## 2021-06-26 ENCOUNTER — Emergency Department: Payer: Medicare Other

## 2021-06-26 ENCOUNTER — Observation Stay
Admission: EM | Admit: 2021-06-26 | Discharge: 2021-06-27 | Disposition: A | Discharge status: Home / Self Care | Payer: Medicare Other | Attending: Emergency Medicine | Admitting: Emergency Medicine

## 2021-06-26 ENCOUNTER — Other Ambulatory Visit: Payer: Self-pay

## 2021-06-26 DIAGNOSIS — F17210 Nicotine dependence, cigarettes, uncomplicated: Secondary | ICD-10-CM | POA: Diagnosis not present

## 2021-06-26 DIAGNOSIS — E878 Other disorders of electrolyte and fluid balance, not elsewhere classified: Secondary | ICD-10-CM

## 2021-06-26 DIAGNOSIS — F1010 Alcohol abuse, uncomplicated: Secondary | ICD-10-CM | POA: Diagnosis not present

## 2021-06-26 DIAGNOSIS — E1142 Type 2 diabetes mellitus with diabetic polyneuropathy: Secondary | ICD-10-CM | POA: Diagnosis not present

## 2021-06-26 DIAGNOSIS — R1013 Epigastric pain: Secondary | ICD-10-CM

## 2021-06-26 DIAGNOSIS — D3500 Benign neoplasm of unspecified adrenal gland: Secondary | ICD-10-CM | POA: Diagnosis not present

## 2021-06-26 DIAGNOSIS — R978 Other abnormal tumor markers: Secondary | ICD-10-CM | POA: Insufficient documentation

## 2021-06-26 DIAGNOSIS — E871 Hypo-osmolality and hyponatremia: Secondary | ICD-10-CM | POA: Diagnosis not present

## 2021-06-26 DIAGNOSIS — Z7984 Long term (current) use of oral hypoglycemic drugs: Secondary | ICD-10-CM | POA: Insufficient documentation

## 2021-06-26 DIAGNOSIS — E785 Hyperlipidemia, unspecified: Secondary | ICD-10-CM | POA: Insufficient documentation

## 2021-06-26 DIAGNOSIS — R079 Chest pain, unspecified: Secondary | ICD-10-CM | POA: Diagnosis present

## 2021-06-26 DIAGNOSIS — F1910 Other psychoactive substance abuse, uncomplicated: Secondary | ICD-10-CM | POA: Diagnosis present

## 2021-06-26 DIAGNOSIS — F10931 Alcohol use, unspecified with withdrawal delirium: Secondary | ICD-10-CM | POA: Diagnosis not present

## 2021-06-26 DIAGNOSIS — Z0389 Encounter for observation for other suspected diseases and conditions ruled out: Secondary | ICD-10-CM | POA: Diagnosis not present

## 2021-06-26 DIAGNOSIS — F17200 Nicotine dependence, unspecified, uncomplicated: Secondary | ICD-10-CM | POA: Diagnosis present

## 2021-06-26 DIAGNOSIS — D3502 Benign neoplasm of left adrenal gland: Secondary | ICD-10-CM | POA: Diagnosis not present

## 2021-06-26 DIAGNOSIS — R739 Hyperglycemia, unspecified: Secondary | ICD-10-CM | POA: Diagnosis not present

## 2021-06-26 DIAGNOSIS — F1911 Other psychoactive substance abuse, in remission: Secondary | ICD-10-CM | POA: Insufficient documentation

## 2021-06-26 DIAGNOSIS — I2510 Atherosclerotic heart disease of native coronary artery without angina pectoris: Secondary | ICD-10-CM | POA: Insufficient documentation

## 2021-06-26 DIAGNOSIS — Z955 Presence of coronary angioplasty implant and graft: Secondary | ICD-10-CM | POA: Insufficient documentation

## 2021-06-26 DIAGNOSIS — R7989 Other specified abnormal findings of blood chemistry: Secondary | ICD-10-CM

## 2021-06-26 DIAGNOSIS — Z20822 Contact with and (suspected) exposure to covid-19: Secondary | ICD-10-CM | POA: Insufficient documentation

## 2021-06-26 DIAGNOSIS — I10 Essential (primary) hypertension: Secondary | ICD-10-CM | POA: Insufficient documentation

## 2021-06-26 DIAGNOSIS — Z720 Tobacco use: Secondary | ICD-10-CM | POA: Diagnosis not present

## 2021-06-26 DIAGNOSIS — M069 Rheumatoid arthritis, unspecified: Secondary | ICD-10-CM | POA: Diagnosis present

## 2021-06-26 DIAGNOSIS — E1159 Type 2 diabetes mellitus with other circulatory complications: Secondary | ICD-10-CM | POA: Diagnosis present

## 2021-06-26 DIAGNOSIS — R110 Nausea: Secondary | ICD-10-CM | POA: Diagnosis not present

## 2021-06-26 DIAGNOSIS — Z8673 Personal history of transient ischemic attack (TIA), and cerebral infarction without residual deficits: Secondary | ICD-10-CM | POA: Diagnosis not present

## 2021-06-26 DIAGNOSIS — Y900 Blood alcohol level of less than 20 mg/100 ml: Secondary | ICD-10-CM | POA: Insufficient documentation

## 2021-06-26 DIAGNOSIS — Z743 Need for continuous supervision: Secondary | ICD-10-CM | POA: Diagnosis not present

## 2021-06-26 DIAGNOSIS — R6889 Other general symptoms and signs: Secondary | ICD-10-CM | POA: Diagnosis not present

## 2021-06-26 DIAGNOSIS — F10239 Alcohol dependence with withdrawal, unspecified: Secondary | ICD-10-CM | POA: Insufficient documentation

## 2021-06-26 DIAGNOSIS — R0789 Other chest pain: Secondary | ICD-10-CM | POA: Diagnosis not present

## 2021-06-26 DIAGNOSIS — I252 Old myocardial infarction: Secondary | ICD-10-CM | POA: Insufficient documentation

## 2021-06-26 DIAGNOSIS — G4700 Insomnia, unspecified: Secondary | ICD-10-CM | POA: Insufficient documentation

## 2021-06-26 DIAGNOSIS — F10939 Alcohol use, unspecified with withdrawal, unspecified: Secondary | ICD-10-CM | POA: Diagnosis present

## 2021-06-26 DIAGNOSIS — F419 Anxiety disorder, unspecified: Secondary | ICD-10-CM | POA: Insufficient documentation

## 2021-06-26 HISTORY — DX: Type 2 diabetes mellitus without complications: E11.9

## 2021-06-26 LAB — CBG MONITORING, ED
Glucose-Capillary: 173 mg/dL — ABNORMAL HIGH (ref 70–99)
Glucose-Capillary: 223 mg/dL — ABNORMAL HIGH (ref 70–99)

## 2021-06-26 LAB — ETHANOL: Alcohol, Ethyl (B): <10 mg/dL (ref <10)

## 2021-06-26 LAB — HEPATIC FUNCTION PANEL
ALT: 18 U/L (ref 0–44)
AST: 25 U/L (ref 15–41)
Albumin: 3.8 g/dL (ref 3.5–5.0)
Alkaline Phosphatase: 58 U/L (ref 38–126)
Bilirubin, Direct: 0.2 mg/dL (ref 0.0–0.2)
Indirect Bilirubin: 1.2 mg/dL — ABNORMAL HIGH (ref 0.3–0.9)
Total Bilirubin: 1.4 mg/dL — ABNORMAL HIGH (ref 0.3–1.2)
Total Protein: 6.5 g/dL (ref 6.5–8.1)

## 2021-06-26 LAB — TROPONIN I (HIGH SENSITIVITY)
Troponin I (High Sensitivity): 13 ng/L (ref <18)
Troponin I (High Sensitivity): 14 ng/L (ref <18)
Troponin I (High Sensitivity): 17 ng/L (ref <18)

## 2021-06-26 LAB — URINE DRUG SCREEN, QUALITATIVE (ARMC ONLY)
Amphetamines, Ur Screen: NOT DETECTED
Barbiturates, Ur Screen: NOT DETECTED
Benzodiazepine, Ur Scrn: NOT DETECTED
Cannabinoid 50 Ng, Ur ~~LOC~~: NOT DETECTED
Cocaine Metabolite,Ur ~~LOC~~: NOT DETECTED
MDMA (Ecstasy)Ur Screen: NOT DETECTED
Methadone Scn, Ur: NOT DETECTED
Opiate, Ur Screen: NOT DETECTED
Phencyclidine (PCP) Ur S: NOT DETECTED
Tricyclic, Ur Screen: NOT DETECTED

## 2021-06-26 LAB — RESP PANEL BY RT-PCR (FLU A&B, COVID) ARPGX2
Influenza A by PCR: NEGATIVE
Influenza B by PCR: NEGATIVE
SARS Coronavirus 2 by RT PCR: NEGATIVE

## 2021-06-26 LAB — CBC
HCT: 38.9 % — ABNORMAL LOW (ref 39.0–52.0)
Hemoglobin: 15.1 g/dL (ref 13.0–17.0)
MCH: 35.9 pg — ABNORMAL HIGH (ref 26.0–34.0)
MCHC: 35.8 g/dL (ref 30.0–36.0)
MCV: 92.4 fL (ref 80.0–100.0)
Platelets: 262 10*3/uL (ref 150–400)
RBC: 4.21 MIL/uL — ABNORMAL LOW (ref 4.22–5.81)
RDW: 40.7 % — ABNORMAL HIGH (ref 11.5–15.5)
WBC: 9.4 10*3/uL (ref 4.0–10.5)
nRBC: 0 % (ref 0.0–0.2)

## 2021-06-26 LAB — LIPASE, BLOOD: Lipase: 45 U/L (ref 11–51)

## 2021-06-26 LAB — URINALYSIS, COMPLETE (UACMP) WITH MICROSCOPIC
Bacteria, UA: NONE SEEN
Bilirubin Urine: NEGATIVE
Glucose, UA: NEGATIVE mg/dL
Hgb urine dipstick: NEGATIVE
Ketones, ur: NEGATIVE mg/dL
Leukocytes,Ua: NEGATIVE
Nitrite: NEGATIVE
Protein, ur: NEGATIVE mg/dL
Specific Gravity, Urine: 1.015 (ref 1.005–1.030)
Squamous Epithelial / HPF: NONE SEEN (ref 0–5)
WBC, UA: NONE SEEN WBC/hpf (ref 0–5)
pH: 7 (ref 5.0–8.0)

## 2021-06-26 LAB — BASIC METABOLIC PANEL
Anion gap: 11 (ref 5–15)
BUN: 11 mg/dL (ref 6–20)
CO2: 23 mmol/L (ref 22–32)
Calcium: 8.4 mg/dL — ABNORMAL LOW (ref 8.9–10.3)
Chloride: 80 mmol/L — ABNORMAL LOW (ref 98–111)
Creatinine, Ser: 0.72 mg/dL (ref 0.61–1.24)
GFR, Estimated: >60 mL/min (ref >60)
Glucose, Bld: 209 mg/dL — ABNORMAL HIGH (ref 70–99)
Potassium: 3.9 mmol/L (ref 3.5–5.1)
Sodium: 114 mmol/L — CL (ref 135–145)

## 2021-06-26 LAB — D-DIMER, QUANTITATIVE: D-Dimer, Quant: 1.23 ug/mL-FEU — ABNORMAL HIGH (ref 0.00–0.50)

## 2021-06-26 LAB — SODIUM, URINE, RANDOM: Sodium, Ur: 33 mmol/L

## 2021-06-26 LAB — MAGNESIUM: Magnesium: 1.5 mg/dL — ABNORMAL LOW (ref 1.7–2.4)

## 2021-06-26 MED ORDER — ADULT MULTIVITAMIN W/MINERALS CH
1.0000 | ORAL_TABLET | Freq: Every day | ORAL | Status: DC
  Administered 2021-06-26 – 2021-06-27 (×2): 1 via ORAL
  Filled 2021-06-26 (×2): qty 1

## 2021-06-26 MED ORDER — ISOSORBIDE MONONITRATE ER 60 MG PO TB24
30.0000 mg | ORAL_TABLET | Freq: Every day | ORAL | Status: DC
  Administered 2021-06-27: 30 mg via ORAL
  Filled 2021-06-26: qty 1

## 2021-06-26 MED ORDER — LORAZEPAM 2 MG/ML IJ SOLN
2.0000 mg | Freq: Once | INTRAMUSCULAR | Status: AC
  Administered 2021-06-26: 2 mg via INTRAVENOUS
  Filled 2021-06-26: qty 1

## 2021-06-26 MED ORDER — THIAMINE HCL 100 MG PO TABS: 100.0000 mg | ORAL_TABLET | Freq: Every day | ORAL | Status: DC

## 2021-06-26 MED ORDER — CLOPIDOGREL BISULFATE 75 MG PO TABS
75.0000 mg | ORAL_TABLET | Freq: Every day | ORAL | Status: DC
  Administered 2021-06-26 – 2021-06-27 (×2): 75 mg via ORAL
  Filled 2021-06-26 (×2): qty 1

## 2021-06-26 MED ORDER — ONDANSETRON HCL 4 MG/2ML IJ SOLN: 4.0000 mg | Freq: Four times a day (QID) | INTRAMUSCULAR | Status: DC | PRN

## 2021-06-26 MED ORDER — NICOTINE 21 MG/24HR TD PT24
21.0000 mg | MEDICATED_PATCH | Freq: Every day | TRANSDERMAL | Status: DC
  Administered 2021-06-26 – 2021-06-27 (×2): 21 mg via TRANSDERMAL
  Filled 2021-06-26 (×2): qty 1

## 2021-06-26 MED ORDER — SODIUM CHLORIDE 0.9 % IV BOLUS
1000.0000 mL | Freq: Once | INTRAVENOUS | Status: AC
  Administered 2021-06-26: 1000 mL via INTRAVENOUS

## 2021-06-26 MED ORDER — THIAMINE HCL 100 MG/ML IJ SOLN
100.0000 mg | Freq: Every day | INTRAMUSCULAR | Status: DC
  Administered 2021-06-26: 100 mg via INTRAVENOUS
  Filled 2021-06-26 (×2): qty 2

## 2021-06-26 MED ORDER — LORAZEPAM 1 MG PO TABS
1.0000 mg | ORAL_TABLET | ORAL | Status: DC | PRN
  Administered 2021-06-27: 2 mg via ORAL

## 2021-06-26 MED ORDER — TAMSULOSIN HCL 0.4 MG PO CAPS: 0.4000 mg | ORAL_CAPSULE | Freq: Every day | ORAL | Status: DC

## 2021-06-26 MED ORDER — FOLIC ACID 1 MG PO TABS
1.0000 mg | ORAL_TABLET | Freq: Every day | ORAL | Status: DC
  Administered 2021-06-26 – 2021-06-27 (×2): 1 mg via ORAL
  Filled 2021-06-26 (×2): qty 1

## 2021-06-26 MED ORDER — ALUM & MAG HYDROXIDE-SIMETH 200-200-20 MG/5ML PO SUSP
30.0000 mL | Freq: Once | ORAL | Status: AC
  Administered 2021-06-26: 30 mL via ORAL
  Filled 2021-06-26: qty 30

## 2021-06-26 MED ORDER — LORAZEPAM 1 MG PO TABS: 1.0000 mg | ORAL_TABLET | Freq: Once | ORAL | Status: DC

## 2021-06-26 MED ORDER — IOHEXOL 350 MG/ML SOLN
75.0000 mL | Freq: Once | INTRAVENOUS | Status: AC | PRN
  Administered 2021-06-26: 75 mL via INTRAVENOUS

## 2021-06-26 MED ORDER — GABAPENTIN 400 MG PO CAPS
800.0000 mg | ORAL_CAPSULE | Freq: Four times a day (QID) | ORAL | Status: DC
  Administered 2021-06-26 – 2021-06-27 (×2): 800 mg via ORAL
  Filled 2021-06-26 (×3): qty 2
  Filled 2021-06-26: qty 8
  Filled 2021-06-26: qty 2

## 2021-06-26 MED ORDER — EZETIMIBE 10 MG PO TABS
10.0000 mg | ORAL_TABLET | Freq: Every day | ORAL | Status: DC
  Administered 2021-06-27: 10 mg via ORAL
  Filled 2021-06-26: qty 1

## 2021-06-26 MED ORDER — INSULIN ASPART 100 UNIT/ML IJ SOLN
0.0000 [IU] | INTRAMUSCULAR | Status: DC
Start: 2021-06-26 — End: 2021-06-27
  Administered 2021-06-26: 3 [IU] via SUBCUTANEOUS
  Administered 2021-06-26: 5 [IU] via SUBCUTANEOUS
  Administered 2021-06-27: 8 [IU] via SUBCUTANEOUS
  Administered 2021-06-27: 3 [IU] via SUBCUTANEOUS
  Administered 2021-06-27: 2 [IU] via SUBCUTANEOUS
  Filled 2021-06-26 (×4): qty 1

## 2021-06-26 MED ORDER — LORAZEPAM 2 MG/ML IJ SOLN: 1.0000 mg | INTRAMUSCULAR | Status: DC | PRN

## 2021-06-26 MED ORDER — TRAMADOL HCL 50 MG PO TABS
50.0000 mg | ORAL_TABLET | Freq: Three times a day (TID) | ORAL | Status: DC | PRN
  Administered 2021-06-26: 50 mg via ORAL
  Filled 2021-06-26: qty 1

## 2021-06-26 MED ORDER — PANTOPRAZOLE SODIUM 40 MG IV SOLR
40.0000 mg | Freq: Once | INTRAVENOUS | Status: AC
  Administered 2021-06-26: 40 mg via INTRAVENOUS
  Filled 2021-06-26: qty 40

## 2021-06-26 MED ORDER — LORAZEPAM 2 MG/ML IJ SOLN: 0.0000 mg | Freq: Two times a day (BID) | INTRAMUSCULAR | Status: DC

## 2021-06-26 MED ORDER — LORAZEPAM 2 MG/ML IJ SOLN
0.0000 mg | Freq: Four times a day (QID) | INTRAMUSCULAR | Status: DC
  Administered 2021-06-26: 2 mg via INTRAVENOUS
  Filled 2021-06-26: qty 1

## 2021-06-26 MED ORDER — ACETAMINOPHEN 325 MG PO TABS: 650.0000 mg | ORAL_TABLET | ORAL | Status: DC | PRN

## 2021-06-26 MED ORDER — THIAMINE HCL 100 MG PO TABS
100.0000 mg | ORAL_TABLET | Freq: Every day | ORAL | Status: DC
  Administered 2021-06-27: 100 mg via ORAL
  Filled 2021-06-26: qty 1

## 2021-06-26 MED ORDER — ROSUVASTATIN CALCIUM 20 MG PO TABS
20.0000 mg | ORAL_TABLET | Freq: Every day | ORAL | Status: DC
  Administered 2021-06-26: 20 mg via ORAL
  Filled 2021-06-26 (×3): qty 1

## 2021-06-26 MED ORDER — CARVEDILOL 6.25 MG PO TABS
12.5000 mg | ORAL_TABLET | Freq: Two times a day (BID) | ORAL | Status: DC
  Administered 2021-06-27: 12.5 mg via ORAL
  Filled 2021-06-26: qty 2

## 2021-06-26 MED ORDER — MAGNESIUM SULFATE 4 GM/100ML IV SOLN
4.0000 g | Freq: Once | INTRAVENOUS | Status: AC
  Administered 2021-06-26: 4 g via INTRAVENOUS
  Filled 2021-06-26: qty 100

## 2021-06-26 MED ORDER — LORAZEPAM 2 MG PO TABS
0.0000 mg | ORAL_TABLET | Freq: Four times a day (QID) | ORAL | Status: DC
  Administered 2021-06-27: 1 mg via ORAL
  Administered 2021-06-27: 2 mg via ORAL
  Filled 2021-06-26 (×3): qty 1

## 2021-06-26 MED ORDER — LORAZEPAM 2 MG PO TABS: 0.0000 mg | ORAL_TABLET | Freq: Two times a day (BID) | ORAL | Status: DC

## 2021-06-26 MED ORDER — SODIUM CHLORIDE 0.9 % IV SOLN: Freq: Once | INTRAVENOUS | Status: AC

## 2021-06-26 MED ORDER — HYDROXYZINE HCL 25 MG PO TABS
25.0000 mg | ORAL_TABLET | Freq: Three times a day (TID) | ORAL | Status: DC | PRN
  Administered 2021-06-27: 25 mg via ORAL
  Filled 2021-06-26: qty 1

## 2021-06-26 MED ORDER — THIAMINE HCL 100 MG/ML IJ SOLN: 100.0000 mg | Freq: Every day | INTRAMUSCULAR | Status: DC

## 2021-06-26 MED ORDER — ONDANSETRON HCL 4 MG/2ML IJ SOLN
4.0000 mg | Freq: Once | INTRAMUSCULAR | Status: AC
  Administered 2021-06-26: 4 mg via INTRAVENOUS
  Filled 2021-06-26: qty 2

## 2021-06-26 MED ORDER — ENOXAPARIN SODIUM 40 MG/0.4ML IJ SOSY
40.0000 mg | PREFILLED_SYRINGE | INTRAMUSCULAR | Status: DC
  Administered 2021-06-26: 40 mg via SUBCUTANEOUS
  Filled 2021-06-26: qty 0.4

## 2021-06-26 NOTE — ED Notes (Signed)
Patient has eaten entire meal tray.

## 2021-06-26 NOTE — ED Notes (Signed)
Patient is ambulating frequently to hall bathroom and stated that heartburn caused him to vomit. Patient states that was the first time today.

## 2021-06-26 NOTE — ED Notes (Signed)
Patient is back from CT. Patient denies nausea at this time.

## 2021-06-26 NOTE — ED Provider Notes (Addendum)
[facility] Emergency Department Provider Note  ____________________________________________   Event Date/Time   First MD Initiated Contact with Patient 06/26/21 1453     (approximate)  I have reviewed the triage vital signs and the nursing notes.   HISTORY  Chief Complaint Chest Pain   HPI Tyler Martinez is a 50 y.o. male with past medical history of CAD with stents on ASA and Plavix, HTN, HDL, DM, arthritis, anxiety, CVA without significant residual deficits, RA, and EtOH abuse with patient stating he drinks typically about a sixpack per day including last night who presents for assessment of several complaints but primarily some left-sided and substernal and epigastric and left upper quadrant pain.  Patient states this started last night.  It is in the setting of mild productive cough that he has had for at least a couple days but is not sure exactly when it began.  He states that today he felt lightheaded and like he was about to pass out and felt more short of breath than usual.  He denies any earache or sore throat or vision changes but states that when he received some nitro with EMS he got bit of a headache.  He did not have any headache before nitro.  He denies any recent injuries or falls.  He denies any other abdominal pain, back pain, nausea, vomiting, diarrhea, burning with urination, rash or extremity pain.  Denies any illicit drug use.  Has no other acute concerns at this time.  He states EMS did give him 4 baby aspirin's prior to arrival.         Past Medical History:  Diagnosis Date   Anxiety    Arthritis    Cardiac arrest ([place]) 2008   Coronary artery disease    Diabetes mellitus without complication ([place])    Heart attack ([place])    Hyperlipidemia    Hypertension    MI (myocardial infarction) ([place])    Stroke [facility])     Patient Active Problem List   Diagnosis Date Noted   Abnormal nuclear stress test    Chest pain 05/18/2021   CAD  (coronary artery disease) 05/18/2021   Tobacco abuse 05/18/2021   History of substance abuse ([place]) 01/12/2020   Alcohol dependence ([place]) 01/12/2020   Coronary artery disease involving native coronary artery of native heart without angina pectoris 04/14/2019   Essential hypertension 04/14/2019   Hyperlipidemia associated with type 2 diabetes mellitus ([place]) 04/14/2019   History of cerebrovascular accident (CVA) with residual deficit 04/14/2019   Residual cognitive deficit as late effect of stroke 04/14/2019   Type 2 diabetes mellitus with vascular disease ([place]) 04/14/2019   Diabetic polyneuropathy associated with type 2 diabetes mellitus ([place]) 04/14/2019   Rheumatoid arthritis involving multiple sites ([place]) 04/14/2019   Flat foot 04/14/2019   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 Authorizing Provider  Blood Glucose Monitoring Suppl (ONETOUCH VERIO) w/Device KIT Use to check blood sugar up to twice a day. 01/12/20   Karamalegos, Devonne Doughty,  DO  carvedilol (COREG) 12.5 MG tablet Take 1 tablet (12.5 mg total) by mouth 2 (two) times daily with a meal. 05/20/21 06/19/21  Ralene Muskrat B, MD  clopidogrel (PLAVIX) 75 MG tablet Take 1 tablet (75 mg total) by mouth daily. 02/06/21   Karamalegos, Devonne Doughty, DO  ezetimibe (ZETIA) 10 MG tablet Take 1 tablet (10 mg total) by mouth daily. 05/21/21 06/20/21  Sidney Ace, MD  gabapentin (NEURONTIN) 800 MG tablet Take 1 tablet (800 mg total) by mouth 4 (four) times daily. 02/06/21   Karamalegos,  Devonne Doughty, DO  hydrOXYzine (VISTARIL) 25 MG capsule TAKE 1 CAPSULE (25 MG TOTAL) BY MOUTH EVERY 8 (EIGHT) HOURS AS NEEDED FOR ANXIETY. 06/12/21   Karamalegos, Devonne Doughty, DO  isosorbide mononitrate (IMDUR) 30 MG 24 hr tablet Take 1 tablet (30 mg total) by mouth daily. 05/25/21   Furth, Cadence H, PA-C  Lancets (ONETOUCH ULTRASOFT) lancets Use to check blood sugar up to twice a day. 01/12/20   Karamalegos, Devonne Doughty, DO  lisinopril (ZESTRIL) 40 MG tablet Take 1 tablet (40 mg total) by mouth daily. 05/21/21 06/20/21  Sidney Ace, MD  metFORMIN (GLUCOPHAGE) 1000 MG tablet Take 1 tablet (1,000 mg total) by mouth 2 (two) times daily with a meal. 02/06/21   Karamalegos, Devonne Doughty, DO  nicotine (NICODERM CQ - DOSED IN MG/24 HOURS) 21 mg/24hr patch Place 1 patch (21 mg total) onto the skin daily. Patient not taking: No sig reported 05/21/21   Sidney Ace, MD  [facility] VERIO test strip Use to check blood sugar up to twice a day. 01/12/20   Karamalegos, Devonne Doughty, DO  predniSONE (DELTASONE) 20 MG tablet Take daily with food. Start with 17m (3 pills) x 2 days, then reduce to 497m(2 pills) x 2 days, then 2075m1 pill) x 3 days 06/05/21   KarOlin HauserO  rosuvastatin (CRESTOR) 20 MG tablet Take 1 tablet (20 mg total) by mouth at bedtime. 05/20/21 06/19/21  SreSidney AceD  traMADol (ULTRAM) 50 MG tablet Take 1 tablet (50 mg total) by mouth every 8 (eight) hours as needed. 06/05/21   Karamalegos, AleDevonne DoughtyO    Allergies Leflunomide, Methotrexate derivatives, and Other  Family History  Problem Relation Age of Onset   Cancer Mother    Hyperlipidemia Father    Hypertension Father    Heart attack Father 50 81Diabetes Father    Hypertension Brother     Social History Social History   Tobacco Use   Smoking status: Every Day    Packs/day: 1.00    Years: 8.00    Pack years: 8.00    Types: Cigarettes   Smokeless tobacco: Never  Vaping Use   Vaping Use:  Never used  Substance Use Topics   Alcohol use: Yes    Alcohol/week: 2.0 standard drinks    Types: 2 Standard drinks or equivalent per week    Comment: Drinks mixed drinks every evening.   Drug use: Not Currently    Review of Systems  Review of Systems  Constitutional:  Negative for chills and fever.  HENT:  Negative for sore throat.   Eyes:  Negative for pain.  Respiratory:  Positive for cough and shortness of breath. Negative for stridor.   Cardiovascular:  Positive for chest pain.  Gastrointestinal:  Negative for vomiting.  Skin:  Negative for rash.  Neurological:  Positive for headaches (after nitro). Negative for seizures and loss of consciousness.  Psychiatric/Behavioral:  Negative for suicidal ideas.   All other systems reviewed and are negative.    ____________________________________________   PHYSICAL EXAM:  VITAL SIGNS: ED Triage Vitals  Enc Vitals Group     BP 06/26/21 1244 (!) 126/99     Pulse Rate 06/26/21 1244 79     Resp 06/26/21 1244 (!) 22     Temp 06/26/21 1244 98.8 F (37.1 C)     Temp Source 06/26/21 1244 Oral     SpO2 06/26/21 1244 96 %     Weight 06/26/21 1244 215 lb (97.5 kg)     Height 06/26/21 1244 6' (1.829 m)     Head Circumference --      Peak Flow --      Pain Score 06/26/21 1305 10     Pain Loc --      Pain Edu? --      Excl. in [place]? --    Vitals:   06/26/21 1725 06/26/21 1727  BP: (!) 132/100 (!) 132/100  Pulse: 75 (!) 111  Resp: 18   Temp:    SpO2: 95%    Physical Exam Vitals and nursing note reviewed.  Constitutional:      Appearance: He is well-developed.  HENT:     Head: Normocephalic and atraumatic.     Right Ear: External ear normal.     Left Ear: External ear normal.     Nose: Nose normal.     Mouth/Throat:     Mouth: Mucous membranes are dry.  Eyes:     Conjunctiva/sclera: Conjunctivae normal.  Cardiovascular:     Rate and Rhythm: Normal rate and regular rhythm.     Heart sounds: No murmur heard. Pulmonary:      Effort: Pulmonary effort is normal. Tachypnea present. No respiratory distress.     Breath sounds: Normal breath sounds.  Abdominal:     Palpations: Abdomen is soft.     Tenderness: There is no abdominal tenderness.  Musculoskeletal:     Cervical back: Neck supple.  Skin:    General: Skin is warm and dry.     Capillary Refill: Capillary refill takes 2 to 3 seconds.  Neurological:     Mental Status: He is alert and oriented to person, place, and time.     Motor: Tremor present.  Psychiatric:        Mood and Affect: Mood normal.    Patient is very restless.  Cranial nerves II to XII are grossly intact.  Patient is symmetric strength in his bilateral upper and lower extremities.  No pronator drift. ____________________________________________   LABS (all labs ordered are listed, but only abnormal results are displayed)  Labs Reviewed  BASIC METABOLIC PANEL - Abnormal; Notable for the following components:      Result Value   Sodium 114 (*)    Chloride 80 (*)    Glucose, Bld 209 (*)    Calcium 8.4 (*)    All other components within normal limits  CBC - Abnormal; Notable for the following components:   RBC 4.21 (*)    HCT 38.9 (*)    MCH 35.9 (*)    RDW 40.7 (*)    All other components within normal limits  MAGNESIUM - Abnormal; Notable for the following components:   Magnesium 1.5 (*)    All other components within normal limits  D-DIMER, QUANTITATIVE - Abnormal; Notable for the following components:   D-Dimer, Quant 1.23 (*)    All other components within normal limits  HEPATIC FUNCTION PANEL - Abnormal; Notable for the following components:   Total Bilirubin 1.4 (*)    Indirect Bilirubin 1.2 (*)    All other components within normal limits  CBG MONITORING, ED - Abnormal; Notable for the following components:   Glucose-Capillary 173 (*)    All other components within normal limits  RESP PANEL BY RT-PCR (FLU A&B, COVID) ARPGX2  LIPASE, BLOOD  ETHANOL  URINALYSIS,  COMPLETE (UACMP) WITH MICROSCOPIC  SODIUM, URINE, RANDOM  TROPONIN I (HIGH SENSITIVITY)  TROPONIN I (HIGH SENSITIVITY)   ____________________________________________  EKG Sinus rhythm with ventricular rate of 80, normal axis, unremarkable intervals with nonspecific ST change in lead III and V2 without other clear evidence of acute ischemia or significant arrhythmia.  ____________________________________________  RADIOLOGY  ED MD interpretation: Chest x-ray shows no focal consolidation, effusion, edema but does show some mild peribronchial thickening consistent with possible bronchitis.  No other acute thoracic abnormalities noted.  CTA chest shows no evidence of PE, large aneurysm, dissection, pneumonia, pneumothorax, effusion or other acute process.  There is a left adrenal adenoma noted.  No other acute thoracic process.  Official radiology report(s): DG Chest 2 View  Result Date: 06/26/2021 CLINICAL DATA:  Chest pain.  Nausea. EXAM: CHEST - 2 VIEW COMPARISON:  05/18/2021 FINDINGS: Prior median sternotomy. Midline trachea. Normal heart size and mediastinal contours. No pleural effusion or pneumothorax. Diffuse peribronchial thickening. No lobar consolidation. No free intraperitoneal air. IMPRESSION: 1. No acute cardiopulmonary disease. 2. Mild peribronchial thickening which may relate to chronic bronchitis or smoking. Electronically Signed   By: Abigail Miyamoto M.D.   On: 06/26/2021 14:12   CT Angio Chest PE W and/or Wo Contrast  Result Date: 06/26/2021 CLINICAL DATA:  Concern for pulmonary embolism. EXAM: CT ANGIOGRAPHY CHEST WITH CONTRAST TECHNIQUE: Multidetector CT imaging of the chest was performed using the standard protocol during bolus administration of intravenous contrast. Multiplanar CT image reconstructions and MIPs were obtained to evaluate the vascular anatomy. CONTRAST:  80m OMNIPAQUE IOHEXOL 350 MG/ML SOLN COMPARISON:  Chest CT dated 05/18/2021. FINDINGS: Cardiovascular:  There is no cardiomegaly or pericardial effusion. Three-vessel coronary vascular calcification. The thoracic aorta is unremarkable. No pulmonary artery embolus identified. Mediastinum/Nodes: No hilar or mediastinal adenopathy. The esophagus is grossly unremarkable. No mediastinal fluid collection. Lungs/Pleura: The lungs are clear. There is no pleural effusion pneumothorax. The central airways are patent. Upper Abdomen: There is a 3.5 cm left adrenal adenoma. Musculoskeletal: Median sternotomy wires. Old healed right rib fractures. No acute osseous pathology. Review of the MIP images confirms the above findings. IMPRESSION: 1. No acute intrathoracic pathology. No CT evidence of pulmonary artery embolus. 2. Left adrenal adenoma. Electronically Signed   By: AAnner CreteM.D.   On: 06/26/2021 17:25    ____________________________________________   PROCEDURES  Procedure(s) performed (including Critical Care):  .1-3 Lead EKG Interpretation Performed by: SLucrezia Starch MD Authorized by: SLucrezia Starch MD     Interpretation: non-specific     ECG rate assessment: normal     Rhythm: sinus rhythm     Ectopy: none     Conduction: normal     ____________________________________________   INITIAL IMPRESSION / ASSESSMENT AND PLAN / ED COURSE     Patient presents with above-stated history exam for assessment of some chest pain associated with shortness of breath and lightheadedness setting of a couple days of nonproductive cough with some white sputum and EtOH abuse described above.  On arrival patient is tachypneic with otherwise stable vital signs  on room air.  On exam he is alert and oriented has a nonfocal neurological exam but does appear to have mild tremor and somewhat restless.  Differential includes ACS, arrhythmia, PE, pericarditis, myocarditis, pneumonia, pneumothorax, COPD exacerbation, pleurisy possible GERD peptic ulcer disease given there is an epigastric component.  It is also  possible patient has pancreatitis.  Sinus rhythm with ventricular rate of 80, normal axis, unremarkable intervals with nonspecific ST change in lead III and V2 without other clear evidence of acute ischemia or significant arrhythmia.  While patient's initial troponin is nonelevated and I have low suspicion for ACS at this time I will obtain a delta troponin and patient's history.  Obtain a CTA chest to rule out a PE as D-dimer is elevated slightly more than 1.  Chest x-ray shows no focal consolidation, effusion, edema but does show some mild peribronchial thickening consistent with possible bronchitis.  No other acute thoracic abnormalities noted.  Repeat troponin nonelevated at 14.  Magnesium 1.5.  Hepatic function panel with T bili just above upper limit of normal at 1.4 with no evidence of acute hepatitis and normal alk phos.  Low suspicion for acute cholecystitis at this time.  Lipase of 45 not consistent with acute pancreatitis.  BMP somewhat unexpectedly remarkable for sodium of 114, chloride of 88 and a glucose of 209.  CBC shows no leukocytosis or acute anemia.  Serum ethanol undetectable but given some tremulousness with patient reporting he drinks at least 1 sixpack of beer per day and was placed on CIWA.  Initial CIWA score was 23 and patient was initially given 2 mg of Ativan.  I suspect hyponatremia likely secondary to alcohol abuse.  He received 1 L of normal saline prior to my assessment and I will defer any additional electrolyte resuscitation in favor of gentle maintenance of normal saline.  CTA chest shows no evidence of PE, large aneurysm, dissection, pneumonia, pneumothorax, effusion or other acute process.  There is a left adrenal adenoma noted.  No other acute thoracic process.  I did discuss patient's incidentally noted left adrenal adenoma and recommendation for outpatient PCP follow-up to determine follow-up imaging.  I will plan to admit to medicine service for further  evaluation management of hyponatremia associated with hypomagnesemia I suspect related to alcohol abuse.  Patient also noted to be in alcohol withdrawal emergency room with initial CIWA greater than 20 which seem to respond well to Ativan.     ____________________________________________   FINAL CLINICAL IMPRESSION(S) / ED DIAGNOSES  Final diagnoses:  Chest pain, unspecified type  Epigastric pain  Hyponatremia  Alcohol abuse  Hypochloremia  Positive D dimer  Hypomagnesemia  Adenoma of left adrenal gland    Medications  LORazepam (ATIVAN) injection 0-4 mg (2 mg Intravenous Given 06/26/21 1628)    Or  LORazepam (ATIVAN) tablet 0-4 mg ( Oral See Alternative 06/26/21 1628)  LORazepam (ATIVAN) injection 0-4 mg (has no administration in time range)    Or  LORazepam (ATIVAN) tablet 0-4 mg (has no administration in time range)  thiamine tablet 100 mg ( Oral See Alternative 06/26/21 1633)    Or  thiamine (B-1) injection 100 mg (100 mg Intravenous Given 06/26/21 1633)  insulin aspart (novoLOG) injection 0-15 Units (3 Units Subcutaneous Given 06/26/21 1644)  0.9 %  sodium chloride infusion (has no administration in time range)  magnesium sulfate IVPB 4 g 100 mL (has no administration in time range)  LORazepam (ATIVAN) injection 2 mg (has no administration in time range)  sodium chloride 0.9 % bolus 1,000 mL (0 mLs Intravenous Stopped 06/26/21 1537)  pantoprazole (PROTONIX) injection 40 mg (40 mg Intravenous Given 06/26/21 1638)  alum & mag hydroxide-simeth (MAALOX/MYLANTA) 200-200-20 MG/5ML suspension 30 mL (30 mLs Oral Given 06/26/21 1645)  ondansetron (ZOFRAN) injection 4 mg (4 mg Intravenous Given 06/26/21 1631)  iohexol (OMNIPAQUE) 350 MG/ML injection 75 mL (75 mLs Intravenous Contrast Given 06/26/21 1703)     ED Discharge Orders     None        Note:  This document was prepared using Dragon voice recognition software and may include unintentional dictation errors.     Lucrezia Starch, MD 06/26/21 1734    Lucrezia Starch, MD 06/26/21 [phone]

## 2021-06-26 NOTE — ED Notes (Signed)
Pt states CP of 8 mid left chest, same as when he called EMS, with no relief. Pt nauseas and vomited 2X in last 30 minutes, LL abdominal pain 'under rib cage'.  States HA in back of head

## 2021-06-26 NOTE — Assessment & Plan Note (Signed)
Controlled with metformin at home. Most recent HbA1c is 6.8 on 05/18/2021. Will follow glucoses with FSBS and SSI as inpatient.

## 2021-06-26 NOTE — Assessment & Plan Note (Signed)
Present on CT. Will need outpatient work up.

## 2021-06-26 NOTE — Assessment & Plan Note (Signed)
Pt has a habit of heavy alcohol use. His last drink was last night. He presented with tremors. He has had CIWA's of 10 or greater and is receiving ativan. He will be admitted on a alcohol withdrwal protocol.

## 2021-06-26 NOTE — Assessment & Plan Note (Signed)
The patient has a history of amphetamine abuse, opiate abuse, and cocaine abuse. As he has presented here with complaints of chest pain, I will check a urine drug screen.

## 2021-06-26 NOTE — ED Triage Notes (Signed)
BIB [facility].  Hx MI. C/O CP.  150/110 -- 2 spray NTG and 324 ASA given.  Recently seen by cardiology who told patient that "all vessels were clogged but he didn't need bypass surgery because his heart had created circulation around blockages".

## 2021-06-26 NOTE — H&P (Addendum)
Tyler Martinez is an 50 y.o. male.   Chief Complaint: Chest pain HPI: The patient is a 50 yr old man who states that he began having left sided chest pain that was substernal, epigastric and left upper quadrant pain last night. It was accompanied by lightheadedness and pre-syncope. He was also short of breath with it. He got a headache with nitroglycerine given by EMS. He was also given 4 ASA 81 mg  en route by EMS.   The patient has a past medical history of heavy alcohol abuse, polysubstance abuse, CAD s/p MI and stent placement, hypertension hyperlipidemia, DM II, rheumatoid arthritis, anxiety, and Hx of CVA.  He states that he has had a cough that is sometimes productive of sputum for an undetermined length of time. He denies fevers, chills, nasal congestion, throat pain, ear pain, GERD, nausea, vomiting, constipation, diarrhea, joint pain, or swelling. He denies neurological changes other than tremors. No lateralizing signs.No rashes, sores, or lesions.  In the ED the patient was found to have a magnesium of 1.5, hyperbilirubinemia. EKG demonstrated NSR. Sodium was 114 (132 on 05/18/2021). His CIWA scores were in the teens and required dosing with ativan. CTA chest demonstrated mild peribronchial thickening and a left adrenal adenoma (incidental finding).   [facility] hospitalists were consulted to admit the patient for further evaluation and treatment.  Past Medical History:  Diagnosis Date   Anxiety    Arthritis    Cardiac arrest [facility]) 2008   Coronary artery disease    Diabetes mellitus without complication ([place])    Heart attack ([place])    Hyperlipidemia    Hypertension    MI (myocardial infarction) ([place])    Stroke [facility])     Past Surgical History:  Procedure Laterality Date   CAROTID STENT  2008   EXPLORATION POST OPERATIVE OPEN HEART  2008   HERNIA REPAIR  2008   inguinal   LEFT HEART CATH AND CORONARY ANGIOGRAPHY N/A 05/19/2021   Procedure: LEFT HEART CATH AND CORONARY ANGIOGRAPHY;   Surgeon: Wellington Hampshire, MD;  Location: [place] [facility];  Service: Cardiovascular;  Laterality: N/A;    Family History  Problem Relation Age of Onset   Cancer Mother    Hyperlipidemia Father    Hypertension Father    Heart attack Father 18   Diabetes Father    Hypertension Brother    Social History:  reports that he has been smoking cigarettes. He has a 8.00 pack-year smoking history. He has never used smokeless tobacco. He reports current alcohol use of about 2.0 standard drinks per week. He reports that he does not currently use drugs. (Not in a hospital admission)   Allergies:  Allergies  Allergen Reactions   Leflunomide Nausea And Vomiting   Methotrexate Derivatives Nausea And Vomiting    Have RA but can not tolerate any  Of the med  No components found with this name: [id number] saw Rheumatology in 2005   Other Nausea And Vomiting    Have RA but can not tolerate any  Of the med  No components found with this name: [id number] saw Rheumatology in 2005    ROS: 12 systems reviewed with patient. All were found to be negative except for those elements included in HPI above.  General appearance: alert, cooperative, distracted, fatigued, mild distress, and tremulous. Head: Normocephalic, without obvious abnormality, atraumatic Eyes: conjunctivae/corneas clear. PERRL, EOM's intact. Fundi benign. Throat: lips, mucosa, and tongue normal; teeth and gums normal Neck: no adenopathy, no carotid bruit, no  JVD, supple, symmetrical, trachea midline, and thyroid not enlarged, symmetric, no tenderness/mass/nodules Resp:  No increased work of breathing. No wheezes, rales, or rhonchi. No tactile fremitus. Chest wall: no tenderness Cardio: regular rate and rhythm, S1, S2 normal, no murmur, click, rub or gallop GI: soft, non-tender; bowel sounds normal; no masses,  no organomegaly Extremities: extremities normal, atraumatic, no cyanosis or edema Pulses: 2+ and symmetric Skin: Skin color,  texture, turgor normal. No rashes or lesions Lymph nodes: Cervical, supraclavicular, and axillary nodes normal. Neurologic: Alert and oriented X 3, normal strength and tone. Normal symmetric reflexes. Normal coordination and gait Tremulous. Incision/Wound: None noted.  Results for orders placed or performed during the hospital encounter of 06/26/21 (from the past 48 hour(s))  Basic metabolic panel     Status: Abnormal   Collection Time: 06/26/21 12:45 PM  Result Value Ref Range   Sodium 114 (LL) 135 - 145 mmol/L    Comment: CRITICAL RESULT CALLED TO, READ BACK BY AND VERIFIED WITH BRIANNA CHAPMAN 06/26/21 1339 KLW    Potassium 3.9 3.5 - 5.1 mmol/L   Chloride 80 (L) 98 - 111 mmol/L   CO2 23 22 - 32 mmol/L   Glucose, Bld 209 (H) 70 - 99 mg/dL    Comment: Glucose reference range applies only to samples taken after fasting for at least 8 hours.   BUN 11 6 - 20 mg/dL   Creatinine, Ser 0.72 0.61 - 1.24 mg/dL   Calcium 8.4 (L) 8.9 - 10.3 mg/dL   GFR, Estimated >60 >60 mL/min    Comment: (NOTE) Calculated using the CKD-EPI Creatinine Equation (2021)    Anion gap 11 5 - 15    Comment: Performed at [facility], [address] [place],  [address]  CBC     Status: Abnormal   Collection Time: 06/26/21 12:45 PM  Result Value Ref Range   WBC 9.4 4.0 - 10.5 K/uL   RBC 4.21 (L) 4.22 - 5.81 MIL/uL   Hemoglobin 15.1 13.0 - 17.0 g/dL   HCT 38.9 (L) 39.0 - 52.0 %   MCV 92.4 80.0 - 100.0 fL   MCH 35.9 (H) 26.0 - 34.0 pg   MCHC 35.8 30.0 - 36.0 g/dL   RDW 40.7 (H) 11.5 - 15.5 %   Platelets 262 150 - 400 K/uL   nRBC 0 0.0 - 0.2 %    Comment: Performed at [facility], [place], [place] [address]  Troponin I (High Sensitivity)     Status: None   Collection Time: 06/26/21 12:45 PM  Result Value Ref Range   Troponin I (High Sensitivity) 13 <18 ng/L    Comment: (NOTE) Elevated high sensitivity troponin I (hsTnI) values and significant  changes across  serial measurements may suggest ACS but many other  chronic and acute conditions are known to elevate hsTnI results.  Refer to the "Links" section for chest pain algorithms and additional  guidance. Performed at [facility], [place],  [address]   Troponin I (High Sensitivity)     Status: None   Collection Time: 06/26/21  4:01 PM  Result Value Ref Range   Troponin I (High Sensitivity) 14 <18 ng/L    Comment: (NOTE) Elevated high sensitivity troponin I (hsTnI) values and significant  changes across serial measurements may suggest ACS but many other  chronic and acute conditions are known to elevate hsTnI results.  Refer to the "Links" section for chest pain algorithms and additional  guidance.  Performed at [facility], [address] [place], [place] [address]   Magnesium     Status: Abnormal   Collection Time: 06/26/21  4:01 PM  Result Value Ref Range   Magnesium 1.5 (L) 1.7 - 2.4 mg/dL    Comment: Performed at [facility], [address] [place], [place] [address]  Resp Panel by RT-PCR (Flu A&B, Covid) Nasopharyngeal Swab     Status: None   Collection Time: 06/26/21  4:01 PM   Specimen: Nasopharyngeal Swab; Nasopharyngeal(NP) swabs in vial transport medium  Result Value Ref Range   SARS Coronavirus 2 by RT PCR NEGATIVE NEGATIVE    Comment: (NOTE) SARS-CoV-2 target nucleic acids are NOT DETECTED.  The SARS-CoV-2 RNA is generally detectable in upper respiratory specimens during the acute phase of infection. The lowest concentration of SARS-CoV-2 viral copies this assay can detect is 138 copies/mL. A negative result does not preclude SARS-Cov-2 infection and should not be used as the sole basis for treatment or other patient management decisions. A negative result may occur with  improper specimen collection/handling, submission of specimen other than nasopharyngeal swab, presence of viral mutation(s) within the areas  targeted by this assay, and inadequate number of viral copies(<138 copies/mL). A negative result must be combined with clinical observations, patient history, and epidemiological information. The expected result is Negative.  Fact Sheet for Patients:  EntrepreneurPulse.com.au  Fact Sheet for Healthcare Providers:  IncredibleEmployment.be  This test is no t yet approved or cleared by the [place] FDA and  has been authorized for detection and/or diagnosis of SARS-CoV-2 by FDA under an Emergency Use Authorization (EUA). This EUA will remain  in effect (meaning this test can be used) for the duration of the COVID-19 declaration under Section 564(b)(1) of the Act, 21 U.S.C.section 360bbb-3(b)(1), unless the authorization is terminated  or revoked sooner.       Influenza A by PCR NEGATIVE NEGATIVE   Influenza B by PCR NEGATIVE NEGATIVE    Comment: (NOTE) The Xpert Xpress SARS-CoV-2/FLU/RSV plus assay is intended as an aid in the diagnosis of influenza from Nasopharyngeal swab specimens and should not be used as a sole basis for treatment. Nasal washings and aspirates are unacceptable for Xpert Xpress SARS-CoV-2/FLU/RSV testing.  Fact Sheet for Patients: EntrepreneurPulse.com.au  Fact Sheet for Healthcare Providers: IncredibleEmployment.be  This test is not yet approved or cleared by the [place] FDA and has been authorized for detection and/or diagnosis of SARS-CoV-2 by FDA under an Emergency Use Authorization (EUA). This EUA will remain in effect (meaning this test can be used) for the duration of the COVID-19 declaration under Section 564(b)(1) of the Act, 21 U.S.C. section 360bbb-3(b)(1), unless the authorization is terminated or revoked.  Performed at [facility], [address] [place], [place] [address]   D-dimer, quantitative     Status: Abnormal   Collection Time: 06/26/21   4:01 PM  Result Value Ref Range   D-Dimer, Quant 1.23 (H) 0.00 - 0.50 ug/mL-FEU    Comment: (NOTE) At the manufacturer cut-off value of 0.5 g/mL FEU, this assay has a negative predictive value of 95-100%.This assay is intended for use in conjunction with a clinical pretest probability (PTP) assessment model to exclude pulmonary embolism (PE) and deep venous thrombosis (DVT) in outpatients suspected of PE or DVT. Results should be correlated with clinical presentation. Performed at [facility], [address], [place], [place] [address]   Hepatic function panel     Status: Abnormal  Collection Time: 06/26/21  4:01 PM  Result Value Ref Range   Total Protein 6.5 6.5 - 8.1 g/dL   Albumin 3.8 3.5 - 5.0 g/dL   AST 25 15 - 41 U/L   ALT 18 0 - 44 U/L   Alkaline Phosphatase 58 38 - 126 U/L   Total Bilirubin 1.4 (H) 0.3 - 1.2 mg/dL   Bilirubin, Direct 0.2 0.0 - 0.2 mg/dL   Indirect Bilirubin 1.2 (H) 0.3 - 0.9 mg/dL    Comment: Performed at [facility], [address] [place], [place] [address]  Lipase, blood     Status: None   Collection Time: 06/26/21  4:01 PM  Result Value Ref Range   Lipase 45 11 - 51 U/L    Comment: Performed at [facility], [address] [place], [place] [address]  Ethanol     Status: None   Collection Time: 06/26/21  4:01 PM  Result Value Ref Range   Alcohol, Ethyl (B) <10 <10 mg/dL    Comment: (NOTE) Lowest detectable limit for serum alcohol is 10 mg/dL.  For medical purposes only. Performed at [facility], [address] [place], [place] [address]   CBG monitoring, ED     Status: Abnormal   Collection Time: 06/26/21  4:08 PM  Result Value Ref Range   Glucose-Capillary 173 (H) 70 - 99 mg/dL    Comment: Glucose reference range applies only to samples taken after fasting for at least 8 hours.  Urinalysis, Complete w Microscopic     Status: Abnormal   Collection Time: 06/26/21  5:33 PM  Result Value Ref Range    Color, Urine COLORLESS (A) YELLOW   APPearance CLEAR (A) CLEAR   Specific Gravity, Urine 1.015 1.005 - 1.030   pH 7.0 5.0 - 8.0   Glucose, UA NEGATIVE NEGATIVE mg/dL   Hgb urine dipstick NEGATIVE NEGATIVE   Bilirubin Urine NEGATIVE NEGATIVE   Ketones, ur NEGATIVE NEGATIVE mg/dL   Protein, ur NEGATIVE NEGATIVE mg/dL   Nitrite NEGATIVE NEGATIVE   Leukocytes,Ua NEGATIVE NEGATIVE   WBC, UA NONE SEEN 0 - 5 WBC/hpf   Bacteria, UA NONE SEEN NONE SEEN   Squamous Epithelial / LPF NONE SEEN 0 - 5   Mucus PRESENT     Comment: Performed at [facility], [address] [place], [place] [address]  Sodium, urine, random     Status: None   Collection Time: 06/26/21  5:33 PM  Result Value Ref Range   Sodium, Ur 33 mmol/L    Comment: Performed at [facility], [address] [place], [place] [address]  CBG monitoring, ED     Status: Abnormal   Collection Time: 06/26/21  7:51 PM  Result Value Ref Range   Glucose-Capillary 223 (H) 70 - 99 mg/dL    Comment: Glucose reference range applies only to samples taken after fasting for at least 8 hours.   Comment 1 Document in Chart    Comment 2 Call MD NNP PA CNM    @[email or web address] @  Blood pressure 120/85, pulse 88, temperature 98.8 F (37.1 C), temperature source Oral, resp. rate 18, height 6' (1.829 m), weight 97.5 kg, SpO2 95 %.    Assessment/Plan Alcohol withdrawal ([place]) Pt has a habit of heavy alcohol use. His last drink was last night. He presented with tremors. He has had CIWA's of 10 or greater and is receiving ativan. He will be admitted on a alcohol withdrwal protocol.   Hyponatremia Patient presents with  a sodium of 114 most recent Na was 132 on 05/18/2021. It is likely due to osmostat reset and alcohol abuse. Will place the patient on a fluid restriction and monitor. Will place on seizure precautions given that he is also in alcohol withdrawal.  Chest pain Patient presented with left sided chest pain that radiated down  left arm. It was 6/10 in severity and associated with nausea and shortness of breath. In the ED EKG demonstrated NSR and troponins have been negative. The patient will be admitted to a telemetry bed. We will continue to follow troponins and EKG. Echocardiogram will be ordered given the patient's history of CAD and MI with rhuematoid arthritis, continued tobacco smoking, hypertension, and hyperlipidemia. Will check lipid panel in the am.  Polysubstance abuse ([place]) The patient has a history of amphetamine abuse, opiate abuse, and cocaine abuse. As he has presented here with complaints of chest pain, I will check a urine drug screen.  Type 2 diabetes mellitus with vascular disease ([place]) Controlled with metformin at home. Most recent HbA1c is 6.8 on 05/18/2021. Will follow glucoses with FSBS and SSI as inpatient.  Incidental adrenal cortical adenoma Present on CT. Will need outpatient work up.  Chest pain in the setting of history of polysubstance abuse: Urine drus screen is necessary to rule out the role of illicit drug use in causing the patient's presenting symptoms and to allow the use of some medications such as beta blockers. The use of cocaine and the presence of this substance in the patient's system could make the use of beta blockers in the treatment of this patient's chest pain dangerous.  I have seen and examined this patient myself. I have spent 82 minutes in his evaluation and admission.  Glee Lashomb 06/26/2021, 8:00 PM

## 2021-06-26 NOTE — ED Provider Notes (Signed)
Emergency Medicine Provider Triage Evaluation Note  Tyler Martinez , a 50 y.o. male  was evaluated in triage.  Pt complains of centralized chest pain.  Patient states that the pain began last night, radiating up into his neck and into his arm.  Patient has a history of previous MIs, states that the pain feels similar.  He saw his cardiologist a couple weeks ago, had a reassuring stress test at that time according to the patient.  Patient is having no shortness of breath.  No GI complaints.  Patient was feeling well yesterday until developing chest pain..  Review of Systems  Positive: Chest pain radiating into the neck and arm Negative: Abdominal complaints, fevers or chills, URI symptoms  Physical Exam  BP (!) 126/99 (BP Location: Left Arm)   Pulse 79   Temp 98.8 F (37.1 C) (Oral)   Resp (!) 22   Ht 6' (1.829 m)   Wt 97.5 kg   SpO2 96%   BMI 29.16 kg/m  Gen:   Awake, no distress   Resp:  Normal effort  MSK:   Moves extremities without difficulty  Other:    Medical Decision Making  Medically screening exam initiated at 1:11 PM.  Appropriate orders placed.  Kern Gingras was informed that the remainder of the evaluation will be completed by another provider, this initial triage assessment does not replace that evaluation, and the importance of remaining in the ED until their evaluation is complete.  Patient presents with centralized chest pain.  History of previous MIs.  Patient reports that he saw his cardiologist, had a reassuring stress test at the time.  Patient states that symptoms feel similar to previous MIs.  Patient will have EKG, chest x-ray, labs   Brynda Peon 06/26/21 1311    Duffy Bruce, MD 06/27/21 [phone]

## 2021-06-26 NOTE — ED Triage Notes (Signed)
Pt to ED for centralized chest pain with shob and nausea that started last night. Pain radiates down left arm .NAD noted Hx MI and cardiac arrest 2008

## 2021-06-26 NOTE — Assessment & Plan Note (Addendum)
Patient presents with a sodium of 114 most recent Na was 132 on 05/18/2021. It is likely due to osmostat reset and alcohol abuse. Will place the patient on a fluid restriction and monitor. Will place on seizure precautions given that he is also in alcohol withdrawal.

## 2021-06-26 NOTE — ED Notes (Signed)
Patient given ED sandwich tray

## 2021-06-26 NOTE — Assessment & Plan Note (Addendum)
Patient presented with left sided chest pain that radiated down left arm. It was 6/10 in severity and associated with nausea and shortness of breath. In the ED EKG demonstrated NSR and troponins have been negative. The patient will be admitted to a telemetry bed. We will continue to follow troponins and EKG. Echocardiogram will be ordered given the patient's history of CAD and MI with rhuematoid arthritis, continued tobacco smoking, hypertension, and hyperlipidemia. Will check lipid panel in the am.

## 2021-06-26 NOTE — ED Notes (Signed)
Report given to Melissa RN

## 2021-06-26 NOTE — ED Notes (Signed)
Roderic Palau PA notified of NA 114, will  notify Agricultural consultant

## 2021-06-26 NOTE — ED Notes (Signed)
Patient is up several times an hour to urinate. Patient states that has been happening for a few days.

## 2021-06-27 ENCOUNTER — Other Ambulatory Visit: Payer: Self-pay | Admitting: Family Medicine

## 2021-06-27 DIAGNOSIS — E871 Hypo-osmolality and hyponatremia: Secondary | ICD-10-CM | POA: Diagnosis not present

## 2021-06-27 DIAGNOSIS — E1142 Type 2 diabetes mellitus with diabetic polyneuropathy: Secondary | ICD-10-CM

## 2021-06-27 LAB — BASIC METABOLIC PANEL
Anion gap: 8 (ref 5–15)
BUN: 13 mg/dL (ref 6–20)
CO2: 25 mmol/L (ref 22–32)
Calcium: 8.9 mg/dL (ref 8.9–10.3)
Chloride: 96 mmol/L — ABNORMAL LOW (ref 98–111)
Creatinine, Ser: 0.68 mg/dL (ref 0.61–1.24)
GFR, Estimated: >60 mL/min (ref >60)
Glucose, Bld: 169 mg/dL — ABNORMAL HIGH (ref 70–99)
Potassium: 4.4 mmol/L (ref 3.5–5.1)
Sodium: 129 mmol/L — ABNORMAL LOW (ref 135–145)

## 2021-06-27 LAB — CBG MONITORING, ED
Glucose-Capillary: 141 mg/dL — ABNORMAL HIGH (ref 70–99)
Glucose-Capillary: 171 mg/dL — ABNORMAL HIGH (ref 70–99)
Glucose-Capillary: 202 mg/dL — ABNORMAL HIGH (ref 70–99)
Glucose-Capillary: 268 mg/dL — ABNORMAL HIGH (ref 70–99)

## 2021-06-27 MED ORDER — INSULIN ASPART 100 UNIT/ML IJ SOLN: 0.0000 [IU] | Freq: Every day | INTRAMUSCULAR | Status: DC

## 2021-06-27 MED ORDER — THIAMINE HCL 100 MG PO TABS: 100.0000 mg | ORAL_TABLET | Freq: Every day | ORAL | Status: DC

## 2021-06-27 MED ORDER — ADULT MULTIVITAMIN W/MINERALS CH: 1.0000 | ORAL_TABLET | Freq: Every day | ORAL | Status: DC

## 2021-06-27 MED ORDER — INSULIN ASPART 100 UNIT/ML IJ SOLN: 0.0000 [IU] | Freq: Three times a day (TID) | INTRAMUSCULAR | Status: DC

## 2021-06-27 MED ORDER — FOLIC ACID 1 MG PO TABS: 1.0000 mg | ORAL_TABLET | Freq: Every day | ORAL | Status: DC

## 2021-06-27 NOTE — Discharge Summary (Signed)
Physician Discharge Summary   Tyler Martinez  male DOB: 1971-07-30  AJO:[id number]  PCP: Olin Hauser, DO  Admit date: 06/26/2021 Discharge date: 06/27/2021  Admitted From: home Disposition:  home CODE STATUS: Full code  Discharge Instructions     Discharge instructions   Complete by: As directed    Your chest pain is not related to your heart.  For your anxiety issues, you are advised to see a psychologist to start on proper anxiety medication.  Your sodium was low at 114, likely due to the 12 packs of beer you drank the night before.  Sodium level improved to 129 without any intervention.  Please decrease your alcohol and beer intake, which will also help with your sleep.   Dr. Enzo Bi [facility] Course:  For full details, please see H&P, progress notes, consult notes and ancillary notes.  Briefly,  Tyler Martinez is a 50 yr old man with medical history of heavy alcohol abuse, polysubstance abuse, CAD s/p MI and stent placement, hypertension, DM II, rheumatoid arthritis, anxiety, and Hx of CVA who presented with complaints of left sided chest pain that was substernal, epigastric and left upper quadrant pain the night PTA.    Pt was admitted due to Na 114.  Hyponatremia 2/2 Beer potomania Hx of alcohol abuse Patient presents with a sodium of 114, most recent Na was 132 on 05/18/2021.  Pt admitted to drinking 12-packs of beer very Sunday night (which was night of presentation).  Na improved to 129 the next morning without any intervention.  Pt was advised to stop drinking alcohol, or at least to reduce the amount of beer consumed at one time.  Chest pain, ACS ruled out EKG demonstrated NSR with no ACS-related changes, and troponins had been negative.  Pt later reported that he may have been experiencing panic attacks.  Anxiety and insomnia  --pt reported ativan helped with both his anxiety and allowed him to sleep and he felt much better the next  day.   --pt advised to establish with outpatient psych to start proper anxiety medication.  Also advised to reduce alcohol intake, as excess alcohol can interfere with sleep.  Type 2 diabetes mellitus with vascular disease ([place]) Controlled with metformin at home. Most recent HbA1c is 6.8 on 05/18/2021.    Incidental adrenal cortical adenoma Present on CT. Will need outpatient work up.   Discharge Diagnoses:  Active Problems:   Type 2 diabetes mellitus with vascular disease (HCC)   Rheumatoid arthritis involving multiple sites [facility])   Chest pain   Tobacco abuse   Hyponatremia   Alcohol withdrawal (HCC)   Polysubstance abuse ([place])   Incidental adrenal cortical adenoma   30 Day Unplanned Readmission Risk Score    Flowsheet Row ED from 06/26/2021 in [place]  30 Day Unplanned Readmission Risk Score (%) 24.34 Filed at 06/27/2021 1200       This score is the patient's risk of an unplanned readmission within 30 days of being discharged (0 -100%). The score is based on dignosis, age, lab data, medications, orders, and past utilization.   Low:  0-14.9   Medium: 15-21.9   High: 22-29.9   Extreme: 30 and above         Discharge Instructions:  Allergies as of 06/27/2021       Reactions   Leflunomide Nausea And Vomiting   Methotrexate Derivatives Nausea And Vomiting   Have RA  but can not tolerate any  Of the med  No components found with this name: PROTEIN24HR saw Rheumatology in 2005   Other Nausea And Vomiting   Have RA but can not tolerate any  Of the med  No components found with this name: PROTEIN24HR saw Rheumatology in 2005        Medication List     STOP taking these medications    predniSONE 20 MG tablet Commonly known as: DELTASONE       TAKE these medications    carvedilol 12.5 MG tablet Commonly known as: COREG Take 1 tablet (12.5 mg total) by mouth 2 (two) times daily with a meal.   clopidogrel 75 MG  tablet Commonly known as: PLAVIX Take 1 tablet (75 mg total) by mouth daily.   ezetimibe 10 MG tablet Commonly known as: ZETIA Take 1 tablet (10 mg total) by mouth daily.   folic acid 1 MG tablet Commonly known as: FOLVITE Take 1 tablet (1 mg total) by mouth daily. Start taking on: June 28, 2021   gabapentin 800 MG tablet Commonly known as: NEURONTIN Take 1 tablet (800 mg total) by mouth 4 (four) times daily.   hydrOXYzine 25 MG capsule Commonly known as: VISTARIL TAKE 1 CAPSULE (25 MG TOTAL) BY MOUTH EVERY 8 (EIGHT) HOURS AS NEEDED FOR ANXIETY.   isosorbide mononitrate 30 MG 24 hr tablet Commonly known as: IMDUR Take 1 tablet (30 mg total) by mouth daily.   lisinopril 40 MG tablet Commonly known as: ZESTRIL Take 1 tablet (40 mg total) by mouth daily.   metFORMIN 1000 MG tablet Commonly known as: GLUCOPHAGE Take 1 tablet (1,000 mg total) by mouth 2 (two) times daily with a meal.   multivitamin with minerals Tabs tablet Take 1 tablet by mouth daily. Start taking on: June 28, 2021   nicotine 21 mg/24hr patch Commonly known as: NICODERM CQ - dosed in mg/24 hours Place 1 patch (21 mg total) onto the skin daily.   onetouch ultrasoft lancets Use to check blood sugar up to twice a day.   OneTouch Verio test strip Generic drug: glucose blood Use to check blood sugar up to twice a day.   OneTouch Verio w/Device Kit Use to check blood sugar up to twice a day.   rosuvastatin 20 MG tablet Commonly known as: CRESTOR Take 1 tablet (20 mg total) by mouth at bedtime.   thiamine 100 MG tablet Take 1 tablet (100 mg total) by mouth daily. Start taking on: June 28, 2021   traMADol 50 MG tablet Commonly known as: ULTRAM Take 1 tablet (50 mg total) by mouth every 8 (eight) hours as needed.         Follow-up Information     Olin Hauser, DO Follow up in 1 week(s).   Specialty: Family Medicine Contact information: [place]  [id number] [phone]                 Allergies  Allergen Reactions   Leflunomide Nausea And Vomiting   Methotrexate Derivatives Nausea And Vomiting    Have RA but can not tolerate any  Of the med  No components found with this name: PROTEIN24HR saw Rheumatology in 2005   Other Nausea And Vomiting    Have RA but can not tolerate any  Of the med  No components found with this name: PROTEIN24HR saw Rheumatology in 2005     The results of significant diagnostics from this hospitalization (including imaging, microbiology,  ancillary and laboratory) are listed below for reference.   Consultations:   Procedures/Studies: DG Chest 2 View  Result Date: 06/26/2021 CLINICAL DATA:  Chest pain.  Nausea. EXAM: CHEST - 2 VIEW COMPARISON:  05/18/2021 FINDINGS: Prior median sternotomy. Midline trachea. Normal heart size and mediastinal contours. No pleural effusion or pneumothorax. Diffuse peribronchial thickening. No lobar consolidation. No free intraperitoneal air. IMPRESSION: 1. No acute cardiopulmonary disease. 2. Mild peribronchial thickening which may relate to chronic bronchitis or smoking. Electronically Signed   By: Abigail Miyamoto M.D.   On: 06/26/2021 14:12   CT Angio Chest PE W and/or Wo Contrast  Result Date: 06/26/2021 CLINICAL DATA:  Concern for pulmonary embolism. EXAM: CT ANGIOGRAPHY CHEST WITH CONTRAST TECHNIQUE: Multidetector CT imaging of the chest was performed using the standard protocol during bolus administration of intravenous contrast. Multiplanar CT image reconstructions and MIPs were obtained to evaluate the vascular anatomy. CONTRAST:  63m OMNIPAQUE IOHEXOL 350 MG/ML SOLN COMPARISON:  Chest CT dated 05/18/2021. FINDINGS: Cardiovascular: There is no cardiomegaly or pericardial effusion. Three-vessel coronary vascular calcification. The thoracic aorta is unremarkable. No pulmonary artery embolus identified. Mediastinum/Nodes: No hilar or mediastinal adenopathy. The esophagus  is grossly unremarkable. No mediastinal fluid collection. Lungs/Pleura: The lungs are clear. There is no pleural effusion pneumothorax. The central airways are patent. Upper Abdomen: There is a 3.5 cm left adrenal adenoma. Musculoskeletal: Median sternotomy wires. Old healed right rib fractures. No acute osseous pathology. Review of the MIP images confirms the above findings. IMPRESSION: 1. No acute intrathoracic pathology. No CT evidence of pulmonary artery embolus. 2. Left adrenal adenoma. Electronically Signed   By: AAnner CreteM.D.   On: 06/26/2021 17:25      Labs: BNP (last 3 results) Recent Labs    05/01/21 0911  BNP 670.0  Basic Metabolic Panel: Recent Labs  Lab 06/26/21 1245 06/26/21 1601 06/27/21 1253  NA 114*  --  129*  K 3.9  --  4.4  CL 80*  --  96*  CO2 23  --  25  GLUCOSE 209*  --  169*  BUN 11  --  13  CREATININE 0.72  --  0.68  CALCIUM 8.4*  --  8.9  MG  --  1.5*  --    Liver Function Tests: Recent Labs  Lab 06/26/21 1601  AST 25  ALT 18  ALKPHOS 58  BILITOT 1.4*  PROT 6.5  ALBUMIN 3.8   Recent Labs  Lab 06/26/21 1601  LIPASE 45   No results for input(s): AMMONIA in the last 168 hours. CBC: Recent Labs  Lab 06/26/21 1245  WBC 9.4  HGB 15.1  HCT 38.9*  MCV 92.4  PLT 262   Cardiac Enzymes: No results for input(s): CKTOTAL, CKMB, CKMBINDEX, TROPONINI in the last 168 hours. BNP: Invalid input(s): POCBNP CBG: Recent Labs  Lab 06/26/21 1951 06/27/21 0039 06/27/21 0313 06/27/21 0725 06/27/21 1338  GLUCAP 223* 202* 268* 141* 171*   D-Dimer Recent Labs    06/26/21 1601  DDIMER 1.23*   Hgb A1c No results for input(s): HGBA1C in the last 72 hours. Lipid Profile No results for input(s): CHOL, HDL, LDLCALC, TRIG, CHOLHDL, LDLDIRECT in the last 72 hours. Thyroid function studies No results for input(s): TSH, T4TOTAL, T3FREE, THYROIDAB in the last 72 hours.  Invalid input(s): FREET3 Anemia work up No results for input(s):  VITAMINB12, FOLATE, FERRITIN, TIBC, IRON, RETICCTPCT in the last 72 hours. Urinalysis    Component Value Date/Time   COLORURINE COLORLESS (A) 06/26/2021  [place] (A) 06/26/2021 1733   LABSPEC 1.015 06/26/2021 [place] 7.0 06/26/2021 [place] 06/26/2021 1733   HGBUR NEGATIVE 06/26/2021 1733   BILIRUBINUR NEGATIVE 06/26/2021 1733   KETONESUR NEGATIVE 06/26/2021 1733   PROTEINUR NEGATIVE 06/26/2021 1733   NITRITE NEGATIVE 06/26/2021 1733   LEUKOCYTESUR NEGATIVE 06/26/2021 1733   Sepsis Labs Invalid input(s): PROCALCITONIN,  WBC,  LACTICIDVEN Microbiology Recent Results (from the past 240 hour(s))  Resp Panel by RT-PCR (Flu A&B, Covid) Nasopharyngeal Swab     Status: None   Collection Time: 06/26/21  4:01 PM   Specimen: Nasopharyngeal Swab; Nasopharyngeal(NP) swabs in vial transport medium  Result Value Ref Range Status   SARS Coronavirus 2 by RT PCR NEGATIVE NEGATIVE Final    Comment: (NOTE) SARS-CoV-2 target nucleic acids are NOT DETECTED.  The SARS-CoV-2 RNA is generally detectable in upper respiratory specimens during the acute phase of infection. The lowest concentration of SARS-CoV-2 viral copies this assay can detect is 138 copies/mL. A negative result does not preclude SARS-Cov-2 infection and should not be used as the sole basis for treatment or other patient management decisions. A negative result may occur with  improper specimen collection/handling, submission of specimen other than nasopharyngeal swab, presence of viral mutation(s) within the areas targeted by this assay, and inadequate number of viral copies(<138 copies/mL). A negative result must be combined with clinical observations, patient history, and epidemiological information. The expected result is Negative.  Fact Sheet for Patients:  EntrepreneurPulse.com.au  Fact Sheet for Healthcare Providers:  IncredibleEmployment.be  This  test is no t yet approved or cleared by the [place] FDA and  has been authorized for detection and/or diagnosis of SARS-CoV-2 by FDA under an Emergency Use Authorization (EUA). This EUA will remain  in effect (meaning this test can be used) for the duration of the COVID-19 declaration under Section 564(b)(1) of the Act, 21 U.S.C.section 360bbb-3(b)(1), unless the authorization is terminated  or revoked sooner.       Influenza A by PCR NEGATIVE NEGATIVE Final   Influenza B by PCR NEGATIVE NEGATIVE Final    Comment: (NOTE) The Xpert Xpress SARS-CoV-2/FLU/RSV plus assay is intended as an aid in the diagnosis of influenza from Nasopharyngeal swab specimens and should not be used as a sole basis for treatment. Nasal washings and aspirates are unacceptable for Xpert Xpress SARS-CoV-2/FLU/RSV testing.  Fact Sheet for Patients: EntrepreneurPulse.com.au  Fact Sheet for Healthcare Providers: IncredibleEmployment.be  This test is not yet approved or cleared by the [place] FDA and has been authorized for detection and/or diagnosis of SARS-CoV-2 by FDA under an Emergency Use Authorization (EUA). This EUA will remain in effect (meaning this test can be used) for the duration of the COVID-19 declaration under Section 564(b)(1) of the Act, 21 U.S.C. section 360bbb-3(b)(1), unless the authorization is terminated or revoked.  Performed at [facility], [address] [place], [place] [address]      Total time spend on discharging this patient, including the last patient exam, discussing the hospital stay, instructions for ongoing care as it relates to all pertinent caregivers, as well as preparing the medical discharge records, prescriptions, and/or referrals as applicable, is 40 minutes.    Enzo Bi, MD  [facility] 06/27/2021, 2:23 PM

## 2021-06-27 NOTE — Progress Notes (Signed)
Inpatient Diabetes Program Recommendations  AACE/ADA: New Consensus Statement on Inpatient Glycemic Control (2015)  Target Ranges:  Prepandial:   less than 140 mg/dL      Peak postprandial:   less than 180 mg/dL (1-2 hours)      Critically ill patients:  140 - 180 mg/dL   Lab Results  Component Value Date   GLUCAP 141 (H) 06/27/2021   HGBA1C 6.8 (H) 05/18/2021    Review of Glycemic Control Results for Tyler Martinez, Tyler Martinez (MRN [id number]) as of 06/27/2021 11:53  Ref. Range 06/26/2021 19:51 06/27/2021 00:39 06/27/2021 03:13 06/27/2021 07:25  Glucose-Capillary Latest Ref Range: 70 - 99 mg/dL 223 (H) 202 (H) 268 (H) 141 (H)   Diabetes history: DM 2 Outpatient Diabetes medications:  Metformin 1000 mg bid Current orders for Inpatient glycemic control:  None  Inpatient Diabetes Program Recommendations:    Please restart Novolog moderate correction tid with meals and HS while in the hospital. Do not recommend restart of Metformin due to history of ETOH use. May need another oral agent added instead at discharge.   Thanks,  Adah Perl, RN, BC-ADM Inpatient Diabetes Coordinator Pager [phone]  (8a-5p)

## 2021-06-27 NOTE — Telephone Encounter (Signed)
Requested Prescriptions  Pending Prescriptions Disp Refills  . gabapentin (NEURONTIN) 800 MG tablet [Pharmacy Med Name: GABAPENTIN 800 MG TABLET] 120 tablet 5    Sig: TAKE 1 TABLET BY MOUTH 4 TIMES DAILY.     Neurology: Anticonvulsants - gabapentin Passed - 06/27/2021  9:01 PM      Passed - Valid encounter within last 12 months    Recent Outpatient Visits          3 weeks ago Plantar fasciitis of right foot   [place], DO   2 months ago Acute bilateral low back pain with left-sided sciatica   [place], DO   3 months ago Essential hypertension   [place], DO   4 months ago Essential hypertension   [place], DO   1 year ago Type 2 diabetes mellitus with vascular disease ([place])   [facility] Olin Hauser, DO      Future Appointments            In 1 month [facility], [facility]

## 2021-06-27 NOTE — Care Management Obs Status (Signed)
[place] NOTIFICATION   Patient Details  Name: Tyler Martinez MRN: [id number] Date of Birth: 03/08/1971   Medicare Observation Status Notification Given:  Yes    Adelene Amas, [place] 06/27/2021, 2:58 PM

## 2021-06-27 NOTE — Care Management CC44 (Signed)
Condition Code 44 Documentation Completed  Patient Details  Name: Tyler Martinez MRN: [id number] Date of Birth: 08-18-1971   Condition Code 44 given:  Yes Patient signature on Condition Code 44 notice:  Yes Documentation of 2 MD's agreement:  Yes Code 44 added to claim:  Yes    Adelene Amas, [place] 06/27/2021, 2:58 PM

## 2021-06-30 ENCOUNTER — Telehealth: Payer: Medicare Other

## 2021-07-03 ENCOUNTER — Telehealth: Payer: Self-pay | Admitting: Pharmacist

## 2021-07-03 ENCOUNTER — Telehealth: Payer: Medicare Other

## 2021-07-03 NOTE — Telephone Encounter (Signed)
  Chronic Care Management   Outreach Note  07/03/2021 Name: Tyler Martinez MRN: [id number] DOB: [birth year]  Referred by: Olin Hauser, DO Reason for referral : No chief complaint on file.   Was unable to reach patient via telephone today x2 and unable to leave a message as voicemail is not setup.   Follow Up Plan: Will attempt to reach patient by telephone again within the next 2 weeks  Wallace Cullens, PharmD, [place] [facility] [phone]

## 2021-07-10 ENCOUNTER — Telehealth: Payer: Self-pay | Admitting: Pharmacist

## 2021-07-10 ENCOUNTER — Telehealth: Payer: Medicare Other

## 2021-07-10 NOTE — Telephone Encounter (Signed)
  Chronic Care Management   Outreach Note  07/10/2021 Name: Tyler Martinez MRN: [id number] DOB: Sep 06, 1970  Referred by: Olin Hauser, DO Reason for referral : No chief complaint on file.   Was unable to reach patient via telephone today x2 and unable to leave a message as voicemail is not setup.   Follow Up Plan: Will attempt to reach patient by telephone again within the next 2 weeks  Wallace Cullens, PharmD, [place] [facility] [phone]

## 2021-07-17 ENCOUNTER — Telehealth: Payer: Self-pay | Admitting: Pharmacist

## 2021-07-17 ENCOUNTER — Telehealth: Payer: Medicare Other

## 2021-07-17 NOTE — Telephone Encounter (Signed)
  Chronic Care Management   Outreach Note  07/17/2021 Name: Tyler Martinez MRN: [id number] DOB: 1970-11-23  Referred by: Olin Hauser, DO Reason for referral : No chief complaint on file.   Fourth unsuccessful telephone outreach was attempted today. Unable to leave a message as no voicemail is setup. The patient was referred to the case management team for assistance with care management and care coordination.    Plan:  The patient's primary care provider has been notified of our unsuccessful attempts to make or maintain contact with the patient.   The care management team is pleased to engage with this patient at any time in the future should he/she be interested in assistance from the care management team.   Wallace Cullens, PharmD, [place] [facility] [phone]

## 2021-07-26 ENCOUNTER — Ambulatory Visit: Payer: Self-pay | Admitting: *Deleted

## 2021-07-26 NOTE — Telephone Encounter (Signed)
There are no appointments today.  I can add him on tomorrow at 1120 if he would like.  Otherwise would recommend urgent care or ED for further evaluation.

## 2021-07-26 NOTE — Telephone Encounter (Signed)
Reason for Disposition  [1] MODERATE diarrhea (e.g., 4-6 times / day more than normal) AND [2] present > 48 hours (2 days)  Answer Assessment - Initial Assessment Questions 1. DIARRHEA SEVERITY: "How bad is the diarrhea?" "How many more stools have you had in the past 24 hours than normal?"    - NO DIARRHEA (SCALE 0)   - MILD (SCALE 1-3): Few loose or mushy BMs; increase of 1-3 stools over normal daily number of stools; mild increase in ostomy output.   -  MODERATE (SCALE 4-7): Increase of 4-6 stools daily over normal; moderate increase in ostomy output. * SEVERE (SCALE 8-10; OR 'WORST POSSIBLE'): Increase of 7 or more stools daily over normal; moderate increase in ostomy output; incontinence.     moderate 2. ONSET: "When did the diarrhea begin?"      Day before yesterday  3. BM CONSISTENCY: "How loose or watery is the diarrhea?"      Watery  4. VOMITING: "Are you also vomiting?" If Yes, ask: "How many times in the past 24 hours?"      Yes , night before last 6-7 times none today  5. ABDOMINAL PAIN: "Are you having any abdominal pain?" If Yes, ask: "What does it feel like?" (e.g., crampy, dull, intermittent, constant)      Comes and goes , bloated 6. ABDOMINAL PAIN SEVERITY: If present, ask: "How bad is the pain?"  (e.g., Scale 1-10; mild, moderate, or severe)   - MILD (1-3): doesn't interfere with normal activities, abdomen soft and not tender to touch    - MODERATE (4-7): interferes with normal activities or awakens from sleep, abdomen tender to touch    - SEVERE (8-10): excruciating pain, doubled over, unable to do any normal activities       Abdomen bloated  7. ORAL INTAKE: If vomiting, "Have you been able to drink liquids?" "How much liquids have you had in the past 24 hours?"     Yes water, minute maid 8. HYDRATION: "Any signs of dehydration?" (e.g., dry mouth [not just dry lips], too weak to stand, dizziness, new weight loss) "When did you last urinate?"     Dizziness lightheaded  with standing , urinating every 15 minutes  9. EXPOSURE: "Have you traveled to a foreign country recently?" "Have you been exposed to anyone with diarrhea?" "Could you have eaten any food that was spoiled?"     No  10. ANTIBIOTIC USE: "Are you taking antibiotics now or have you taken antibiotics in the past 2 months?"       No  11. OTHER SYMPTOMS: "Do you have any other symptoms?" (e.g., fever, blood in stool)       "Feels feverish" no temp taken  12. PREGNANCY: "Is there any chance you are pregnant?" "When was your last menstrual period?"       na  Protocols used: [facility]

## 2021-07-26 NOTE — Telephone Encounter (Signed)
C/o vomiting , diarrhea since day before yesterday. Diarrhea watery, yellowish in color, atleast 6 times in 24 hours. Abdominal  pain and bloating. C/o vomiting yesterday but better today with yellowish emesis as well. Not able to go to work. Reports abdominal bloating is worse. C/o dizziness and lightheadedness. Frequent urination every 15 minutes . Has not checked blood glucose. Feels feverish but has no thermometer to check temp. Has tried pepto bismol and c/o abdominal bloating worse. Recommended E visit or UC . Patient reports he want appt today if possible. No available appt until 08/04/21. Please advise . Requesting a call back from PCP. Care advise given . Patient verbalized understanding of care advise and to call back or go to [facility] or ED if symptoms worsen.

## 2021-07-26 NOTE — Telephone Encounter (Signed)
The pt was scheduled for tomorrow at 11:20am

## 2021-07-27 ENCOUNTER — Other Ambulatory Visit: Payer: Self-pay

## 2021-07-27 ENCOUNTER — Ambulatory Visit (INDEPENDENT_AMBULATORY_CARE_PROVIDER_SITE_OTHER): Payer: Medicare Other | Admitting: Internal Medicine

## 2021-07-27 ENCOUNTER — Encounter: Payer: Self-pay | Admitting: Internal Medicine

## 2021-07-27 VITALS — BP 116/90 | HR 82 | Temp 97.5°F | Resp 18 | Ht 72.0 in | Wt 207.4 lb

## 2021-07-27 DIAGNOSIS — E1159 Type 2 diabetes mellitus with other circulatory complications: Secondary | ICD-10-CM | POA: Diagnosis not present

## 2021-07-27 DIAGNOSIS — I10 Essential (primary) hypertension: Secondary | ICD-10-CM | POA: Diagnosis not present

## 2021-07-27 DIAGNOSIS — H538 Other visual disturbances: Secondary | ICD-10-CM

## 2021-07-27 DIAGNOSIS — Z23 Encounter for immunization: Secondary | ICD-10-CM | POA: Diagnosis not present

## 2021-07-27 DIAGNOSIS — E871 Hypo-osmolality and hyponatremia: Secondary | ICD-10-CM

## 2021-07-27 DIAGNOSIS — R197 Diarrhea, unspecified: Secondary | ICD-10-CM

## 2021-07-27 DIAGNOSIS — R112 Nausea with vomiting, unspecified: Secondary | ICD-10-CM | POA: Diagnosis not present

## 2021-07-27 MED ORDER — LISINOPRIL 40 MG PO TABS: 40.0000 mg | ORAL_TABLET | Freq: Every day | ORAL | 0 refills | Status: DC

## 2021-07-27 NOTE — Addendum Note (Signed)
Addended by: Wilson Singer on: 07/27/2021 10:41 AM   Modules accepted: Orders

## 2021-07-27 NOTE — Assessment & Plan Note (Signed)
CBG today Due for A1c 07/2021

## 2021-07-27 NOTE — Progress Notes (Signed)
Subjective:    Patient ID: Tyler Martinez, male    DOB: Jul 03, 1971, 50 y.o.   MRN: [id number]  HPI  Patient presents the clinic today with complaint of nausea, vomiting, diarrhea and blurred vision.  He reports this started 3 days ago.  He reports the nausea has resolved and the vomiting and diarrhea has slowed down but he still has blurred vision.  He denies headache, dizziness, chest pain, shortness of breath, reflux or blood in his stool.  He denies numbness, weakness, tingling or altered mental status.  He reports he is supposed to be taking Lisinopril 40 mg daily but is currently taking Lisinopril HCT 20-12.5, 2 tabs daily because no one will send him in a prescription for Lisinopril 40 mg daily.  He reports he was supposed to be taken off of his HCTZ because of low sodium.  He reports he noted he had low sodium when he went to the ER 1 month ago for similar symptoms.  He has not had sick contacts.  He has not tried anything OTC for this.  Review of Systems     Past Medical History:  Diagnosis Date   Anxiety    Arthritis    Cardiac arrest ([place]) 2008   Coronary artery disease    Diabetes mellitus without complication ([place])    Heart attack ([place])    Hyperlipidemia    Hypertension    MI (myocardial infarction) ([place])    Stroke [facility])     Current Outpatient Medications  Medication Sig Dispense Refill   Blood Glucose Monitoring Suppl (ONETOUCH VERIO) w/Device KIT Use to check blood sugar up to twice a day. 1 kit 0   clopidogrel (PLAVIX) 75 MG tablet Take 1 tablet (75 mg total) by mouth daily. 30 tablet 5   gabapentin (NEURONTIN) 800 MG tablet TAKE 1 TABLET BY MOUTH 4 TIMES DAILY. 120 tablet 5   isosorbide mononitrate (IMDUR) 30 MG 24 hr tablet Take 1 tablet (30 mg total) by mouth daily. 90 tablet 3   Lancets (ONETOUCH ULTRASOFT) lancets Use to check blood sugar up to twice a day. 200 each 5   metFORMIN (GLUCOPHAGE) 1000 MG tablet Take 1 tablet (1,000 mg total) by mouth 2 (two) times  daily with a meal. 60 tablet 5   Multiple Vitamin (MULTIVITAMIN WITH MINERALS) TABS tablet Take 1 tablet by mouth daily.     nicotine (NICODERM CQ - DOSED IN MG/24 HOURS) 21 mg/24hr patch Place 1 patch (21 mg total) onto the skin daily. 28 patch 0   ONETOUCH VERIO test strip Use to check blood sugar up to twice a day. 200 each 5   carvedilol (COREG) 12.5 MG tablet Take 1 tablet (12.5 mg total) by mouth 2 (two) times daily with a meal. 60 tablet 0   ezetimibe (ZETIA) 10 MG tablet Take 1 tablet (10 mg total) by mouth daily. 30 tablet 0   folic acid (FOLVITE) 1 MG tablet Take 1 tablet (1 mg total) by mouth daily. (Patient not taking: Reported on 07/27/2021)     hydrOXYzine (VISTARIL) 25 MG capsule TAKE 1 CAPSULE (25 MG TOTAL) BY MOUTH EVERY 8 (EIGHT) HOURS AS NEEDED FOR ANXIETY. (Patient not taking: Reported on 07/27/2021) 270 capsule 1   lisinopril (ZESTRIL) 40 MG tablet Take 1 tablet (40 mg total) by mouth daily. 90 tablet 0   rosuvastatin (CRESTOR) 20 MG tablet Take 1 tablet (20 mg total) by mouth at bedtime. 30 tablet 0   thiamine 100 MG tablet Take 1  tablet (100 mg total) by mouth daily. (Patient not taking: Reported on 07/27/2021)     traMADol (ULTRAM) 50 MG tablet Take 1 tablet (50 mg total) by mouth every 8 (eight) hours as needed. 60 tablet 2   No current facility-administered medications for this visit.    Allergies  Allergen Reactions   Leflunomide Nausea And Vomiting   Methotrexate Derivatives Nausea And Vomiting    Have RA but can not tolerate any  Of the med  No components found with this name: [id number] saw Rheumatology in 2005   Other Nausea And Vomiting    Have RA but can not tolerate any  Of the med  No components found with this name: PROTEIN24HR saw Rheumatology in 2005    Family History  Problem Relation Age of Onset   Cancer Mother    Hyperlipidemia Father    Hypertension Father    Heart attack Father 41   Diabetes Father    Hypertension Brother     Social History    Socioeconomic History   Marital status: Divorced    Spouse name: Not on file   Number of children: Not on file   Years of education: Not on file   Highest education level: Not on file  Occupational History   Occupation: disability  Tobacco Use   Smoking status: Every Day    Packs/day: 1.00    Years: 8.00    Pack years: 8.00    Types: Cigarettes   Smokeless tobacco: Never  Vaping Use   Vaping Use: Never used  Substance and Sexual Activity   Alcohol use: Yes    Alcohol/week: 2.0 standard drinks    Types: 2 Standard drinks or equivalent per week    Comment: Drinks mixed drinks every evening.   Drug use: Not Currently   Sexual activity: Not on file  Other Topics Concern   Not on file  Social History Narrative   Not on file   Social Determinants of Health   Financial Resource Strain: Low Risk    Difficulty of Paying Living Expenses: Not hard at all  Food Insecurity: No Food Insecurity   Worried About Running Out of Food in the Last Year: Never true   [place] in the Last Year: Never true  Transportation Needs: No Transportation Needs   Lack of Transportation (Medical): No   Lack of Transportation (Non-Medical): No  Physical Activity: Inactive   Days of Exercise per Week: 0 days   Minutes of Exercise per Session: 0 min  Stress: No Stress Concern Present   Feeling of Stress : Not at all  Social Connections: Not on file  Intimate Partner Violence: Not on file     Constitutional: Denies fever, malaise, fatigue, headache or abrupt weight changes.  HEENT: Patient reports blurred vision.  Denies eye pain, eye redness, ear pain, ringing in the ears, wax buildup, runny nose, nasal congestion, bloody nose, or sore throat. Respiratory: Denies difficulty breathing, shortness of breath, cough or sputum production.   Cardiovascular: Denies chest pain, chest tightness, palpitations or swelling in the hands or feet.  Gastrointestinal: Patient reports diarrhea.  Denies  abdominal pain, bloating, constipation, diarrhea or blood in the stool.  GU: Denies urgency, frequency, pain with urination, burning sensation, blood in urine, odor or discharge. Musculoskeletal: Denies decrease in range of motion, difficulty with gait, muscle pain or joint pain and swelling.  Skin: Denies redness, rashes, lesions or ulcercations.  Neurological: Denies dizziness, difficulty with memory, difficulty with speech  or problems with balance and coordination.    No other specific complaints in a complete review of systems (except as listed in HPI above).  Objective:   Physical Exam   BP 116/90 (BP Location: Right Arm, Patient Position: Sitting, Cuff Size: Normal)   Pulse 82   Temp (!) 97.5 F (36.4 C) (Temporal)   Resp 18   Ht 6' (1.829 m)   Wt 207 lb 6.4 oz (94.1 kg)   SpO2 100%   BMI 28.13 kg/m  Wt Readings from Last 3 Encounters:  07/27/21 207 lb 6.4 oz (94.1 kg)  06/26/21 215 lb (97.5 kg)  06/05/21 224 lb 3.2 oz (101.7 kg)    General: Appears his stated age, overweight, in NAD. Skin: Warm, dry and intact. No rashes noted. HEENT: Head: normal shape and size; Eyes: sclera white, no icterus, conjunctiva pink, PERRLA and EOMs intact;  Cardiovascular: Normal rate and rhythm. S1,S2 noted.  No murmur, rubs or gallops noted.  Pulmonary/Chest: Normal effort and positive vesicular breath sounds. No respiratory distress. No wheezes, rales or ronchi noted.  Abdomen: Soft and nontender. Normal bowel sounds. No distention or masses noted. Musculoskeletal:  No difficulty with gait.  Neurological: Alert and oriented.   BMET    Component Value Date/Time   NA 129 (L) 06/27/2021 1253   K 4.4 06/27/2021 1253   CL 96 (L) 06/27/2021 1253   CO2 25 06/27/2021 1253   GLUCOSE 169 (H) 06/27/2021 1253   BUN 13 06/27/2021 1253   CREATININE 0.68 06/27/2021 1253   CREATININE 0.75 03/20/2021 1110   CALCIUM 8.9 06/27/2021 1253   GFRNONAA >60 06/27/2021 1253   GFRNONAA 102 06/03/2019  1123   GFRAA 118 06/03/2019 1123    Lipid Panel     Component Value Date/Time   CHOL 244 (H) 05/19/2021 0112   TRIG 112 05/19/2021 0112   HDL 106 05/19/2021 0112   CHOLHDL 2.3 05/19/2021 0112   VLDL 22 05/19/2021 0112   LDLCALC 116 (H) 05/19/2021 0112   LDLCALC 69 03/20/2021 1110    CBC    Component Value Date/Time   WBC 9.4 06/26/2021 1245   RBC 4.21 (L) 06/26/2021 1245   HGB 15.1 06/26/2021 1245   HCT 38.9 (L) 06/26/2021 1245   PLT 262 06/26/2021 1245   MCV 92.4 06/26/2021 1245   MCH 35.9 (H) 06/26/2021 1245   MCHC 35.8 06/26/2021 1245   RDW 40.7 (H) 06/26/2021 1245   LYMPHSABS 1,484 03/20/2021 1110   EOSABS 290 03/20/2021 1110   BASOSABS 41 03/20/2021 1110    Hgb A1C Lab Results  Component Value Date   HGBA1C 6.8 (H) 05/18/2021           Assessment & Plan:  Blurred Vision, Diarrhea, Hx of Hyponatremia:  He was supposed to stop his HCTZ but has been doubling up on his dose Will DC Lisinopril HCT and give Rx for Lisinopril 40 mg per cardiology note CBC and c-Met today Okay to take Imodium OTC as needed He has a history of DM2, advised him to schedule an appointment for an eye exam  We will follow-up after labs are back with further recommendation and treatment plan Webb Silversmith, NP This visit occurred during the SARS-CoV-2 public health emergency.  Safety protocols were in place, including screening questions prior to the visit, additional usage of staff PPE, and extensive cleaning of exam room while observing appropriate contact time as indicated for disinfecting solutions.

## 2021-07-27 NOTE — Assessment & Plan Note (Signed)
He was supposed to stop his HCTZ but has been doubling up on his dose Will DC Lisinopril HCT and give Rx for Lisinopril 40 mg per cardiology note

## 2021-07-27 NOTE — Patient Instructions (Signed)
Diarrhea, Adult °Diarrhea is when you pass loose and watery poop (stool) often. Diarrhea can make you feel weak and cause you to lose water in your body (get dehydrated). Losing water in your body can cause you to: °Feel tired and thirsty. °Have a dry mouth. °Go pee (urinate) less often. °Diarrhea often lasts 2-3 days. However, it can last longer if it is a sign of something more serious. It is important to treat your diarrhea as told by your doctor. °Follow these instructions at home: °Eating and drinking °  °Follow these instructions as told by your doctor: °Take an ORS (oral rehydration solution). This is a drink that helps you replace fluids and minerals your body lost. It is sold at pharmacies and stores. °Drink plenty of fluids, such as: °Water. °Ice chips. °Diluted fruit juice. °Low-calorie sports drinks. °Milk, if you want. °Avoid drinking fluids that have a lot of sugar or caffeine in them. °Eat bland, easy-to-digest foods in small amounts as you are able. These foods include: °Bananas. °Applesauce. °Rice. °Low-fat (lean) meats. °Toast. °Crackers. °Avoid alcohol. °Avoid spicy or fatty foods. ° °Medicines °Take over-the-counter and prescription medicines only as told by your doctor. °If you were prescribed an antibiotic medicine, take it as told by your doctor. Do not stop using the antibiotic even if you start to feel better. °General instructions ° °Wash your hands often using soap and water. If soap and water are not available, use a hand sanitizer. Others in your home should wash their hands as well. Hands should be washed: °After using the toilet or changing a diaper. °Before preparing, cooking, or serving food. °While caring for a sick person. °While visiting someone in a hospital. °Drink enough fluid to keep your pee (urine) pale yellow. °Rest at home while you get better. °Take a warm bath to help with any burning or pain from having diarrhea. °Watch your condition for any changes. °Keep all  follow-up visits as told by your doctor. This is important. °Contact a doctor if: °You have a fever. °Your diarrhea gets worse. °You have new symptoms. °You cannot keep fluids down. °You feel light-headed or dizzy. °You have a headache. °You have muscle cramps. °Get help right away if: °You have chest pain. °You feel very weak or you pass out (faint). °You have bloody or black poop or poop that looks like tar. °You have very bad pain, cramping, or bloating in your belly (abdomen). °You have trouble breathing or you are breathing very quickly. °Your heart is beating very quickly. °Your skin feels cold and clammy. °You feel confused. °You have signs of losing too much water in your body, such as: °Dark pee, very little pee, or no pee. °Cracked lips. °Dry mouth. °Sunken eyes. °Sleepiness. °Weakness. °Summary °Diarrhea is when you pass loose and watery poop (stool) often. °Diarrhea can make you feel weak and cause you to lose water in your body (get dehydrated). °Take an ORS (oral rehydration solution). This is a drink that is sold at pharmacies and stores. °Eat bland, easy-to-digest foods in small amounts as you are able. °Contact a doctor if your condition gets worse. Get help right away if you have signs that you have lost too much water in your body. °This information is not intended to replace advice given to you by your health care provider. Make sure you discuss any questions you have with your health care provider. °Document Revised: 02/22/2021 Document Reviewed: 02/22/2021 °Elsevier Patient Education © 2022 [employer] ° °

## 2021-07-28 ENCOUNTER — Encounter: Payer: Self-pay | Admitting: Emergency Medicine

## 2021-07-28 ENCOUNTER — Telehealth: Payer: Self-pay | Admitting: Nurse Practitioner

## 2021-07-28 ENCOUNTER — Inpatient Hospital Stay
Admission: EM | Admit: 2021-07-28 | Discharge: 2021-07-29 | DRG: 641 | Discharge status: Against Medical Advice | Payer: Medicare Other | Attending: Family Medicine | Admitting: Family Medicine

## 2021-07-28 ENCOUNTER — Other Ambulatory Visit: Payer: Self-pay

## 2021-07-28 ENCOUNTER — Telehealth: Payer: Self-pay

## 2021-07-28 ENCOUNTER — Emergency Department: Payer: Medicare Other

## 2021-07-28 ENCOUNTER — Telehealth: Payer: Self-pay | Admitting: Family Medicine

## 2021-07-28 ENCOUNTER — Ambulatory Visit: Payer: Self-pay | Admitting: Family Medicine

## 2021-07-28 ENCOUNTER — Emergency Department
Admission: EM | Admit: 2021-07-28 | Discharge: 2021-07-28 | Discharge status: Against Medical Advice | Payer: Medicare Other | Source: Home / Self Care

## 2021-07-28 DIAGNOSIS — R251 Tremor, unspecified: Secondary | ICD-10-CM | POA: Diagnosis present

## 2021-07-28 DIAGNOSIS — E089 Diabetes mellitus due to underlying condition without complications: Secondary | ICD-10-CM | POA: Diagnosis not present

## 2021-07-28 DIAGNOSIS — Z20822 Contact with and (suspected) exposure to covid-19: Secondary | ICD-10-CM | POA: Diagnosis present

## 2021-07-28 DIAGNOSIS — F17210 Nicotine dependence, cigarettes, uncomplicated: Secondary | ICD-10-CM | POA: Diagnosis not present

## 2021-07-28 DIAGNOSIS — E860 Dehydration: Secondary | ICD-10-CM | POA: Diagnosis not present

## 2021-07-28 DIAGNOSIS — Z8249 Family history of ischemic heart disease and other diseases of the circulatory system: Secondary | ICD-10-CM

## 2021-07-28 DIAGNOSIS — Z7984 Long term (current) use of oral hypoglycemic drugs: Secondary | ICD-10-CM

## 2021-07-28 DIAGNOSIS — Z79899 Other long term (current) drug therapy: Secondary | ICD-10-CM | POA: Diagnosis not present

## 2021-07-28 DIAGNOSIS — Z809 Family history of malignant neoplasm, unspecified: Secondary | ICD-10-CM

## 2021-07-28 DIAGNOSIS — M069 Rheumatoid arthritis, unspecified: Secondary | ICD-10-CM | POA: Diagnosis not present

## 2021-07-28 DIAGNOSIS — I252 Old myocardial infarction: Secondary | ICD-10-CM | POA: Diagnosis not present

## 2021-07-28 DIAGNOSIS — E119 Type 2 diabetes mellitus without complications: Secondary | ICD-10-CM | POA: Diagnosis present

## 2021-07-28 DIAGNOSIS — Z833 Family history of diabetes mellitus: Secondary | ICD-10-CM

## 2021-07-28 DIAGNOSIS — E785 Hyperlipidemia, unspecified: Secondary | ICD-10-CM | POA: Diagnosis present

## 2021-07-28 DIAGNOSIS — Z8673 Personal history of transient ischemic attack (TIA), and cerebral infarction without residual deficits: Secondary | ICD-10-CM

## 2021-07-28 DIAGNOSIS — I2510 Atherosclerotic heart disease of native coronary artery without angina pectoris: Secondary | ICD-10-CM

## 2021-07-28 DIAGNOSIS — E871 Hypo-osmolality and hyponatremia: Principal | ICD-10-CM

## 2021-07-28 DIAGNOSIS — F1010 Alcohol abuse, uncomplicated: Secondary | ICD-10-CM | POA: Diagnosis present

## 2021-07-28 DIAGNOSIS — I10 Essential (primary) hypertension: Secondary | ICD-10-CM | POA: Diagnosis not present

## 2021-07-28 DIAGNOSIS — F10230 Alcohol dependence with withdrawal, uncomplicated: Secondary | ICD-10-CM

## 2021-07-28 DIAGNOSIS — I639 Cerebral infarction, unspecified: Secondary | ICD-10-CM | POA: Diagnosis not present

## 2021-07-28 DIAGNOSIS — Z7902 Long term (current) use of antithrombotics/antiplatelets: Secondary | ICD-10-CM

## 2021-07-28 LAB — COMPLETE METABOLIC PANEL WITH GFR
AG Ratio: 1.9 (calc) (ref 1.0–2.5)
ALT: 13 U/L (ref 9–46)
AST: 16 U/L (ref 10–40)
Albumin: 4.3 g/dL (ref 3.6–5.1)
Alkaline phosphatase (APISO): 71 U/L (ref 36–130)
BUN: 17 mg/dL (ref 7–25)
CO2: 30 mmol/L (ref 20–32)
Calcium: 9.1 mg/dL (ref 8.6–10.3)
Chloride: <80 mmol/L — ABNORMAL LOW (ref 98–110)
Creat: 0.85 mg/dL (ref 0.60–1.29)
Globulin: 2.3 g/dL (calc) (ref 1.9–3.7)
Glucose, Bld: 166 mg/dL — ABNORMAL HIGH (ref 65–139)
Potassium: 4.6 mmol/L (ref 3.5–5.3)
Sodium: 118 mmol/L — CL (ref 135–146)
Total Bilirubin: 0.6 mg/dL (ref 0.2–1.2)
Total Protein: 6.6 g/dL (ref 6.1–8.1)
eGFR: 107 mL/min/{1.73_m2} (ref >=60)

## 2021-07-28 LAB — BASIC METABOLIC PANEL
Anion gap: 5 (ref 5–15)
BUN: 27 mg/dL — ABNORMAL HIGH (ref 6–20)
CO2: 27 mmol/L (ref 22–32)
Calcium: 9 mg/dL (ref 8.9–10.3)
Chloride: 91 mmol/L — ABNORMAL LOW (ref 98–111)
Creatinine, Ser: 0.76 mg/dL (ref 0.61–1.24)
GFR, Estimated: >60 mL/min (ref >60)
Glucose, Bld: 127 mg/dL — ABNORMAL HIGH (ref 70–99)
Potassium: 5.3 mmol/L — ABNORMAL HIGH (ref 3.5–5.1)
Sodium: 123 mmol/L — ABNORMAL LOW (ref 135–145)

## 2021-07-28 LAB — CBC
HCT: 43.9 % (ref 38.5–50.0)
HCT: 38.2 % — ABNORMAL LOW (ref 39.0–52.0)
Hemoglobin: 15.2 g/dL (ref 13.2–17.1)
Hemoglobin: 13.4 g/dL (ref 13.0–17.0)
MCH: 34.8 pg — ABNORMAL HIGH (ref 27.0–33.0)
MCH: 34.4 pg — ABNORMAL HIGH (ref 26.0–34.0)
MCHC: 34.6 g/dL (ref 32.0–36.0)
MCHC: 35.1 g/dL (ref 30.0–36.0)
MCV: 100.5 fL — ABNORMAL HIGH (ref 80.0–100.0)
MCV: 98.2 fL (ref 80.0–100.0)
MPV: 9.4 fL (ref 7.5–12.5)
Platelets: 280 10*3/uL (ref 140–400)
Platelets: 251 10*3/uL (ref 150–400)
RBC: 4.37 10*6/uL (ref 4.20–5.80)
RBC: 3.89 MIL/uL — ABNORMAL LOW (ref 4.22–5.81)
RDW: 12.6 % (ref 11.0–15.0)
RDW: 12.6 % (ref 11.5–15.5)
WBC: 7.5 10*3/uL (ref 3.8–10.8)
WBC: 8.4 10*3/uL (ref 4.0–10.5)
nRBC: 0 % (ref 0.0–0.2)

## 2021-07-28 LAB — HEPATIC FUNCTION PANEL
ALT: 16 U/L (ref 0–44)
AST: 17 U/L (ref 15–41)
Albumin: 4.1 g/dL (ref 3.5–5.0)
Alkaline Phosphatase: 57 U/L (ref 38–126)
Bilirubin, Direct: 0.1 mg/dL (ref 0.0–0.2)
Indirect Bilirubin: 0.5 mg/dL (ref 0.3–0.9)
Total Bilirubin: 0.6 mg/dL (ref 0.3–1.2)
Total Protein: 6.7 g/dL (ref 6.5–8.1)

## 2021-07-28 LAB — MAGNESIUM: Magnesium: 1.8 mg/dL (ref 1.7–2.4)

## 2021-07-28 LAB — OSMOLALITY: Osmolality: 273 mOsm/kg — ABNORMAL LOW (ref 275–295)

## 2021-07-28 LAB — ETHANOL: Alcohol, Ethyl (B): <10 mg/dL (ref <10)

## 2021-07-28 LAB — TSH: TSH: 2.368 u[IU]/mL (ref 0.350–4.500)

## 2021-07-28 MED ORDER — CARVEDILOL 6.25 MG PO TABS: 12.5000 mg | ORAL_TABLET | Freq: Two times a day (BID) | ORAL | Status: DC

## 2021-07-28 MED ORDER — NICOTINE 21 MG/24HR TD PT24: 21.0000 mg | MEDICATED_PATCH | Freq: Every day | TRANSDERMAL | Status: DC

## 2021-07-28 MED ORDER — SODIUM CHLORIDE 0.9 % IV SOLN: INTRAVENOUS | Status: DC

## 2021-07-28 MED ORDER — GABAPENTIN 800 MG PO TABS
800.0000 mg | ORAL_TABLET | Freq: Four times a day (QID) | ORAL | Status: DC
  Filled 2021-07-28 (×2): qty 1

## 2021-07-28 MED ORDER — ROSUVASTATIN CALCIUM 20 MG PO TABS
20.0000 mg | ORAL_TABLET | Freq: Every day | ORAL | Status: DC
  Filled 2021-07-28: qty 1

## 2021-07-28 MED ORDER — ISOSORBIDE MONONITRATE ER 60 MG PO TB24: 30.0000 mg | ORAL_TABLET | Freq: Every day | ORAL | Status: DC

## 2021-07-28 MED ORDER — SODIUM CHLORIDE 0.9 % IV BOLUS
500.0000 mL | Freq: Once | INTRAVENOUS | Status: AC
  Administered 2021-07-28: 500 mL via INTRAVENOUS

## 2021-07-28 MED ORDER — CLOPIDOGREL BISULFATE 75 MG PO TABS: 75.0000 mg | ORAL_TABLET | Freq: Every day | ORAL | Status: DC

## 2021-07-28 MED ORDER — LACTATED RINGERS IV SOLN
INTRAVENOUS | Status: DC
Start: 2021-07-28 — End: 2021-07-28

## 2021-07-28 MED ORDER — MAGNESIUM HYDROXIDE 400 MG/5ML PO SUSP: 30.0000 mL | Freq: Every day | ORAL | Status: DC | PRN

## 2021-07-28 MED ORDER — ACETAMINOPHEN 325 MG PO TABS: 650.0000 mg | ORAL_TABLET | Freq: Four times a day (QID) | ORAL | Status: DC | PRN

## 2021-07-28 MED ORDER — ACETAMINOPHEN 325 MG RE SUPP: 650.0000 mg | Freq: Four times a day (QID) | RECTAL | Status: DC | PRN

## 2021-07-28 MED ORDER — ONDANSETRON HCL 4 MG/2ML IJ SOLN: 4.0000 mg | Freq: Four times a day (QID) | INTRAMUSCULAR | Status: DC | PRN

## 2021-07-28 MED ORDER — LORAZEPAM 2 MG/ML IJ SOLN: 0.0000 mg | Freq: Two times a day (BID) | INTRAMUSCULAR | Status: DC

## 2021-07-28 MED ORDER — LORAZEPAM 2 MG PO TABS
0.0000 mg | ORAL_TABLET | Freq: Two times a day (BID) | ORAL | Status: DC
Start: 2021-07-31 — End: 2021-07-29

## 2021-07-28 MED ORDER — EZETIMIBE 10 MG PO TABS
10.0000 mg | ORAL_TABLET | Freq: Every day | ORAL | Status: DC
  Filled 2021-07-28: qty 1

## 2021-07-28 MED ORDER — THIAMINE HCL 100 MG PO TABS: 100.0000 mg | ORAL_TABLET | Freq: Every day | ORAL | Status: DC

## 2021-07-28 MED ORDER — LISINOPRIL 10 MG PO TABS: 40.0000 mg | ORAL_TABLET | Freq: Every day | ORAL | Status: DC

## 2021-07-28 MED ORDER — ADULT MULTIVITAMIN W/MINERALS CH
1.0000 | ORAL_TABLET | Freq: Every day | ORAL | Status: DC
Start: 2021-07-29 — End: 2021-07-29

## 2021-07-28 MED ORDER — TRAZODONE HCL 50 MG PO TABS: 25.0000 mg | ORAL_TABLET | Freq: Every evening | ORAL | Status: DC | PRN

## 2021-07-28 MED ORDER — LORAZEPAM 2 MG/ML IJ SOLN
1.0000 mg | Freq: Once | INTRAMUSCULAR | Status: AC
  Administered 2021-07-28: 1 mg via INTRAVENOUS
  Filled 2021-07-28: qty 1

## 2021-07-28 MED ORDER — LORAZEPAM 2 MG PO TABS: 0.0000 mg | ORAL_TABLET | Freq: Four times a day (QID) | ORAL | Status: DC

## 2021-07-28 MED ORDER — THIAMINE HCL 100 MG/ML IJ SOLN
100.0000 mg | Freq: Once | INTRAMUSCULAR | Status: AC
  Administered 2021-07-28: 100 mg via INTRAVENOUS
  Filled 2021-07-28: qty 2

## 2021-07-28 MED ORDER — ONDANSETRON HCL 4 MG PO TABS: 4.0000 mg | ORAL_TABLET | Freq: Four times a day (QID) | ORAL | Status: DC | PRN

## 2021-07-28 MED ORDER — THIAMINE HCL 100 MG/ML IJ SOLN: 100.0000 mg | Freq: Every day | INTRAMUSCULAR | Status: DC

## 2021-07-28 MED ORDER — ENOXAPARIN SODIUM 40 MG/0.4ML IJ SOSY: 40.0000 mg | PREFILLED_SYRINGE | INTRAMUSCULAR | Status: DC

## 2021-07-28 MED ORDER — TRAMADOL HCL 50 MG PO TABS: 50.0000 mg | ORAL_TABLET | Freq: Three times a day (TID) | ORAL | Status: DC | PRN

## 2021-07-28 MED ORDER — LORAZEPAM 2 MG/ML IJ SOLN: 0.0000 mg | Freq: Four times a day (QID) | INTRAMUSCULAR | Status: DC

## 2021-07-28 NOTE — Telephone Encounter (Signed)
I attempted to contact the patient to notify him of his critical lab per [facility] and the need to go to the ER right away, no answer. I called the pt brother Fara Olden and ask for him to reach out to the patient and have him to contact our office.

## 2021-07-28 NOTE — Telephone Encounter (Signed)
Can you call [facility] triage nurse at [phone] and let them know that patient has a critical sodium of 118.  This may or may not get him seen any sooner.

## 2021-07-28 NOTE — ED Triage Notes (Signed)
Pt here with c/o low sodium of 118 after blood work drawn yesterday, pt states he was vomiting yesterday, headache and feeling "pretty awful" for a few days now. NAD.

## 2021-07-28 NOTE — Telephone Encounter (Signed)
Received a call from the on call service regarding patient's sodium at 118.  I see that he has diarrhea and has a history of hyponatremia.  I did not call him due to this. Will reach out to the office to follow up with patient as soon as possible.

## 2021-07-28 NOTE — Telephone Encounter (Signed)
Patient called to report he is in ED at [facility] now and there is a 7-8 hour wait to be seen . Patient reports confusion, dizziness and headache. Critical sodium level of 118. Patient would like for PCP to contact ED to get him seen and treated now "so I don't die" in the ED waiting area. Patient requesting a call from someone when the ED has been notified to see him before 8 hours. Instructed patient if symptoms worsen to notify receptionist at front desk ED his symptoms are worsening. Please advise .

## 2021-07-28 NOTE — ED Provider Notes (Signed)
Emergency Medicine Provider Triage Evaluation Note  Tyler Martinez , a 50 y.o. male  was evaluated in triage.  Patient presents for hyponatremia.  Blood work yesterday at PCPs office showed serum sodium of 118.  Yesterday patient was having severe headache, vomiting, nausea.  Today feeling better but still having headache.  Overall symptoms been present for few days.  Review of Systems  Positive: Headache, nausea/vomiting Negative: Fevers, chest pain, shortness of breath  Physical Exam  There were no vitals taken for this visit. Gen:   Awake, no distress   Resp:  Normal effort, no wheezing rales or rhonchi MSK:   Moves extremities without difficulty     Medical Decision Making  Medically screening exam initiated at 5:14 PM.  Appropriate orders placed.  Jovan Colligan was informed that the remainder of the evaluation will be completed by another provider, this initial triage assessment does not replace that evaluation, and the importance of remaining in the ED until their evaluation is complete.     Duanne Guess, PA-C 07/28/21 1724    Duffy Bruce, MD 07/28/21 2013

## 2021-07-28 NOTE — Telephone Encounter (Signed)
N/A, voicemail not setup so I was unable to leave a message for patient informing him that we are canceling his AWV scheduled for 08/10/21 due to the [facility] schedule changing.    Please reschedule at anytime with [facility] if patient calls back.  40 minute appointment  Any questions, please call me at [phone]

## 2021-07-28 NOTE — ED Provider Notes (Signed)
[facility] Emergency Department Provider Note  ____________________________________________   Event Date/Time   First MD Initiated Contact with Patient 07/28/21 2207     (approximate)  I have reviewed the triage vital signs and the nursing notes.   HISTORY  Chief Complaint Hyponatremia     HPI Tyler Martinez is a 50 y.o. male with history of diabetes, hypertension, hyperlipidemia, chronic alcohol abuse, here with hyponatremia.  The patient reports that several days ago, he had fairly profuse diarrhea, vomiting, and fatigue.  He states that since then, he has been having some headaches and felt generally weak.  He went to his doctor and had lab work drawn which showed hyponatremia.  Subsequent sent here for further evaluation.  Patient states that he has had some mild fatigue, weakness.  Patient has had some mild nausea.  He has noticed mild tremors.  No seizure-like activity.  He also states that he stopped drinking recently, and has not had a drink in 2 days.  He has had a mild tremor since then.  No fevers, chills.  No focal numbness or weakness.  No other medical complaints.    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   Abnormal nuclear stress test    Chest pain 05/18/2021   CAD (coronary artery disease) 05/18/2021   Tobacco abuse 05/18/2021   History of substance abuse ([place]) 01/12/2020   Alcohol dependence ([place]) 01/12/2020   Coronary artery disease involving native coronary artery of native heart without  angina pectoris 04/14/2019   Essential hypertension 04/14/2019   Hyperlipidemia associated with type 2 diabetes mellitus ([place]) 04/14/2019   History of cerebrovascular accident (CVA) with residual deficit 04/14/2019   Residual cognitive deficit as late effect of stroke 04/14/2019   Type 2 diabetes mellitus with vascular disease ([place]) 04/14/2019   Diabetic polyneuropathy associated with type 2 diabetes mellitus (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 Authorizing Provider  Blood Glucose Monitoring Suppl (ONETOUCH VERIO) w/Device KIT Use to check blood sugar up to twice a day. 01/12/20  Yes Karamalegos, Devonne Doughty, DO  clopidogrel (PLAVIX) 75 MG tablet Take 1 tablet (75 mg total) by mouth daily. 02/06/21  Yes Karamalegos, Devonne Doughty, DO  gabapentin (NEURONTIN) 800 MG tablet TAKE 1 TABLET BY MOUTH 4 TIMES DAILY. 06/27/21  Yes Karamalegos, Devonne Doughty, DO  isosorbide mononitrate (IMDUR) 30 MG 24 hr tablet Take 1 tablet (  30 mg total) by mouth daily. 05/25/21  Yes Furth, Cadence H, PA-C  Lancets (ONETOUCH ULTRASOFT) lancets Use to check blood sugar up to twice a day. 01/12/20  Yes Karamalegos, Devonne Doughty, DO  lisinopril (ZESTRIL) 40 MG tablet Take 1 tablet (40 mg total) by mouth daily. 07/27/21 08/26/21 Yes Jearld Fenton, NP  metFORMIN (GLUCOPHAGE) 1000 MG tablet Take 1 tablet (1,000 mg total) by mouth 2 (two) times daily with a meal. 02/06/21  Yes  Karamalegos, Devonne Doughty, DO  Multiple Vitamin (MULTIVITAMIN WITH MINERALS) TABS tablet Take 1 tablet by mouth daily. 06/28/21  Yes Enzo Bi, MD  [facility] VERIO test strip Use to check blood sugar up to twice a day. 01/12/20  Yes Karamalegos, Devonne Doughty, DO  traMADol (ULTRAM) 50 MG tablet Take 1 tablet (50 mg total) by mouth every 8 (eight) hours as needed. 06/05/21  Yes Karamalegos, Devonne Doughty, DO  carvedilol (COREG) 12.5 MG tablet Take 1 tablet (12.5 mg total) by mouth 2 (two) times daily with a meal. 05/20/21 06/26/21  Ralene Muskrat B, MD  ezetimibe (ZETIA) 10 MG tablet Take 1 tablet (10 mg total) by mouth daily. 05/21/21 06/26/21  Sidney Ace, MD  folic acid (FOLVITE) 1 MG tablet Take 1 tablet (1 mg total) by mouth daily. Patient not taking: Reported on 07/27/2021 06/28/21   Enzo Bi, MD  hydrOXYzine (VISTARIL) 25 MG capsule TAKE 1 CAPSULE (25 MG TOTAL) BY MOUTH EVERY 8 (EIGHT) HOURS AS NEEDED FOR ANXIETY. Patient not taking: Reported on 07/27/2021 06/12/21   Olin Hauser, DO  nicotine (NICODERM CQ - DOSED IN MG/24 HOURS) 21 mg/24hr patch Place 1 patch (21 mg total) onto the skin daily. 05/21/21   Sidney Ace, MD  rosuvastatin (CRESTOR) 20 MG tablet Take 1 tablet (20 mg total) by mouth at bedtime. 05/20/21 06/26/21  Sidney Ace, MD  thiamine 100 MG tablet Take 1 tablet (100 mg total) by mouth daily. Patient not taking: Reported on 07/27/2021 06/28/21   Enzo Bi, MD    Allergies Leflunomide, Methotrexate derivatives, and Other  Family History  Problem Relation Age of Onset   Cancer Mother    Hyperlipidemia Father    Hypertension Father    Heart attack Father 79   Diabetes Father    Hypertension Brother     Social History Social History   Tobacco Use   Smoking status: Every Day    Packs/day: 1.00    Years: 8.00    Pack years: 8.00    Types: Cigarettes   Smokeless tobacco: Never  Vaping Use   Vaping Use: Never used  Substance Use Topics    Alcohol use: Yes    Alcohol/week: 2.0 standard drinks    Types: 2 Standard drinks or equivalent per week    Comment: Drinks mixed drinks every evening.   Drug use: Not Currently    Review of Systems  Review of Systems  Constitutional:  Positive for fatigue. Negative for chills and fever.  HENT:  Negative for sore throat.   Respiratory:  Negative for shortness of breath.   Cardiovascular:  Negative for chest pain.  Gastrointestinal:  Positive for diarrhea, nausea and vomiting. Negative for abdominal pain.  Genitourinary:  Negative for flank pain.  Musculoskeletal:  Negative for neck pain.  Skin:  Negative for rash and wound.  Allergic/Immunologic: Negative for immunocompromised state.  Neurological:  Positive for tremors. Negative for weakness and numbness.  Hematological:  Does not bruise/bleed easily.  All other systems reviewed and  are negative.   ____________________________________________  PHYSICAL EXAM:      VITAL SIGNS: ED Triage Vitals  Enc Vitals Group     BP 07/28/21 1717 (!) 150/124     Pulse Rate 07/28/21 1717 77     Resp 07/28/21 1717 18     Temp 07/28/21 1717 97.8 F (36.6 C)     Temp Source 07/28/21 1717 Oral     SpO2 07/28/21 1717 99 %     Weight 07/28/21 1718 207 lb (93.9 kg)     Height 07/28/21 1718 6' (1.829 m)     Head Circumference --      Peak Flow --      Pain Score 07/28/21 1717 9     Pain Loc --      Pain Edu? --      Excl. in [place]? --      Physical Exam Vitals and nursing note reviewed.  Constitutional:      General: He is not in acute distress.    Appearance: He is well-developed.  HENT:     Head: Normocephalic and atraumatic.     Mouth/Throat:     Mouth: Mucous membranes are dry.  Eyes:     Conjunctiva/sclera: Conjunctivae normal.  Cardiovascular:     Rate and Rhythm: Normal rate and regular rhythm.     Heart sounds: Normal heart sounds. No murmur heard.   No friction rub.  Pulmonary:     Effort: Pulmonary effort is normal. No  respiratory distress.     Breath sounds: Normal breath sounds. No wheezing or rales.  Abdominal:     General: There is no distension.     Palpations: Abdomen is soft.     Tenderness: There is no abdominal tenderness.  Musculoskeletal:     Cervical back: Neck supple.  Skin:    General: Skin is warm.     Capillary Refill: Capillary refill takes less than 2 seconds.  Neurological:     Mental Status: He is alert and oriented to person, place, and time.     Motor: No abnormal muscle tone.     Comments: Mild tremor noted. CN intact. Strength 5/5 bl UE and LE. Normal sensation to light touch.      ____________________________________________   LABS (all labs ordered are listed, but only abnormal results are displayed)  Labs Reviewed  CBC - Abnormal; Notable for the following components:      Result Value   RBC 3.89 (*)    HCT 38.2 (*)    MCH 34.4 (*)    All other components within normal limits  BASIC METABOLIC PANEL - Abnormal; Notable for the following components:   Sodium 123 (*)    Potassium 5.3 (*)    Chloride 91 (*)    Glucose, Bld 127 (*)    BUN 27 (*)    All other components within normal limits  RESP PANEL BY RT-PCR (FLU A&B, COVID) ARPGX2  ETHANOL  MAGNESIUM  HEPATIC FUNCTION PANEL  TSH  URINALYSIS, COMPLETE (UACMP) WITH MICROSCOPIC  OSMOLALITY  OSMOLALITY, URINE  SODIUM, URINE, RANDOM  BASIC METABOLIC PANEL  CBC    ____________________________________________  EKG:  ________________________________________  RADIOLOGY All imaging, including plain films, CT scans, and ultrasounds, independently reviewed by me, and interpretations confirmed via formal radiology reads.  ED MD interpretation:   CT Head: [place]  Official radiology report(s): CT Head Wo Contrast  Result Date: 07/28/2021 CLINICAL DATA:  Headache, intracranial hemorrhage suspected EXAM: CT HEAD WITHOUT CONTRAST TECHNIQUE:  Contiguous axial images were obtained from the base of the skull  through the vertex without intravenous contrast. COMPARISON:  None. FINDINGS: Brain: There is no acute intracranial hemorrhage, mass effect, or edema. No acute appearing loss of gray-white differentiation. Chronic infarcts of the right greater than left occipital lobes and cerebellum. There is no extra-axial fluid collection. Ventricles and sulci are within normal limits in size and configuration. Vascular: There is mild atherosclerotic calcification at the skull base. Skull: Calvarium is unremarkable. Sinuses/Orbits: No acute finding. Other: None. IMPRESSION: No acute intracranial abnormality.  Chronic infarcts detailed above. Electronically Signed   By: Macy Mis M.D.   On: 07/28/2021 18:09    ____________________________________________  PROCEDURES   Procedure(s) performed (including Critical Care):  Procedures  ____________________________________________  INITIAL IMPRESSION / MDM / [place] / ED COURSE  As part of my medical decision making, I reviewed the following data within the [place] notes reviewed and incorporated, Old chart reviewed, Notes from prior ED visits, and [place] Controlled Substance Database       *Tyler Martinez was evaluated in Emergency Department on 07/28/2021 for the symptoms described in the history of present illness. He was evaluated in the context of the global COVID-19 pandemic, which necessitated consideration that the patient might be at risk for infection with the SARS-CoV-2 virus that causes COVID-19. Institutional protocols and algorithms that pertain to the evaluation of patients at risk for COVID-19 are in a state of rapid change based on information released by regulatory bodies including the CDC and federal and state organizations. These policies and algorithms were followed during the patient's care in the ED.  Some ED evaluations and interventions may be delayed as a result of limited staffing during the  pandemic.*     Medical Decision Making: 49 year old male here with symptomatic hyponatremia.  I suspect this is multifactorial secondary to his regular alcohol use as well as what sounds like a possible GI illness several days ago with subsequent hypokalemia.  He does appear mildly dehydrated.  BMP shows a sodium of 123.  Potassium slightly elevated.  BUN and creatinine ratio greater than 20-1 consistent with mild dehydration.  Will give 500 cc of fluid and start maintenance.  CT head negative.  Otherwise, the patient does clinically appear to be mildly withdrawing.  Will start CIWA protocol.  He states he stopped drinking approximately 2 days ago.  No seizures.  Patient will be admitted to medicine.  ____________________________________________  FINAL CLINICAL IMPRESSION(S) / ED DIAGNOSES  Final diagnoses:  Hyponatremia     MEDICATIONS GIVEN DURING THIS VISIT:  Medications  0.9 %  sodium chloride infusion (has no administration in time range)  LORazepam (ATIVAN) injection 0-4 mg (has no administration in time range)    Or  LORazepam (ATIVAN) tablet 0-4 mg (has no administration in time range)  LORazepam (ATIVAN) injection 0-4 mg (has no administration in time range)    Or  LORazepam (ATIVAN) tablet 0-4 mg (has no administration in time range)  thiamine tablet 100 mg (has no administration in time range)    Or  thiamine (B-1) injection 100 mg (has no administration in time range)  traMADol (ULTRAM) tablet 50 mg (has no administration in time range)  carvedilol (COREG) tablet 12.5 mg (has no administration in time range)  ezetimibe (ZETIA) tablet 10 mg (has no administration in time range)  isosorbide mononitrate (IMDUR) 24 hr tablet 30 mg (has no administration in time range)  lisinopril (ZESTRIL) tablet 40  mg (has no administration in time range)  rosuvastatin (CRESTOR) tablet 20 mg (has no administration in time range)  nicotine (NICODERM CQ - dosed in mg/24 hours) patch 21 mg  (has no administration in time range)  clopidogrel (PLAVIX) tablet 75 mg (has no administration in time range)  gabapentin (NEURONTIN) tablet 800 mg (has no administration in time range)  multivitamin with minerals tablet 1 tablet (has no administration in time range)  thiamine tablet 100 mg (has no administration in time range)  enoxaparin (LOVENOX) injection 40 mg (has no administration in time range)  0.9 %  sodium chloride infusion (has no administration in time range)  acetaminophen (TYLENOL) tablet 650 mg (has no administration in time range)    Or  acetaminophen (TYLENOL) suppository 650 mg (has no administration in time range)  traZODone (DESYREL) tablet 25 mg (has no administration in time range)  magnesium hydroxide (MILK OF MAGNESIA) suspension 30 mL (has no administration in time range)  ondansetron (ZOFRAN) tablet 4 mg (has no administration in time range)    Or  ondansetron (ZOFRAN) injection 4 mg (has no administration in time range)  thiamine (B-1) injection 100 mg (100 mg Intravenous Given 07/28/21 2301)  LORazepam (ATIVAN) injection 1 mg (1 mg Intravenous Given 07/28/21 2302)  sodium chloride 0.9 % bolus 500 mL (500 mLs Intravenous New Bag/Given 07/28/21 2302)     ED Discharge Orders     None        Note:  This document was prepared using Dragon voice recognition software and may include unintentional dictation errors.   Duffy Bruce, MD 07/28/21 2337

## 2021-07-28 NOTE — Telephone Encounter (Signed)
Pt notified of results by CMA, advised ER evaluation

## 2021-07-29 ENCOUNTER — Other Ambulatory Visit: Payer: Self-pay

## 2021-07-29 ENCOUNTER — Observation Stay
Admission: EM | Admit: 2021-07-29 | Discharge: 2021-07-30 | Disposition: A | Discharge status: Home / Self Care | Payer: Medicare Other | Attending: Internal Medicine | Admitting: Internal Medicine

## 2021-07-29 ENCOUNTER — Encounter: Payer: Self-pay | Admitting: Emergency Medicine

## 2021-07-29 DIAGNOSIS — F17210 Nicotine dependence, cigarettes, uncomplicated: Secondary | ICD-10-CM | POA: Insufficient documentation

## 2021-07-29 DIAGNOSIS — I10 Essential (primary) hypertension: Secondary | ICD-10-CM | POA: Diagnosis not present

## 2021-07-29 DIAGNOSIS — E119 Type 2 diabetes mellitus without complications: Secondary | ICD-10-CM | POA: Insufficient documentation

## 2021-07-29 DIAGNOSIS — F10230 Alcohol dependence with withdrawal, uncomplicated: Secondary | ICD-10-CM | POA: Diagnosis not present

## 2021-07-29 DIAGNOSIS — Z79899 Other long term (current) drug therapy: Secondary | ICD-10-CM | POA: Insufficient documentation

## 2021-07-29 DIAGNOSIS — E871 Hypo-osmolality and hyponatremia: Secondary | ICD-10-CM | POA: Diagnosis not present

## 2021-07-29 DIAGNOSIS — R82998 Other abnormal findings in urine: Secondary | ICD-10-CM

## 2021-07-29 DIAGNOSIS — E1159 Type 2 diabetes mellitus with other circulatory complications: Secondary | ICD-10-CM | POA: Diagnosis present

## 2021-07-29 DIAGNOSIS — M069 Rheumatoid arthritis, unspecified: Secondary | ICD-10-CM | POA: Diagnosis present

## 2021-07-29 DIAGNOSIS — I2510 Atherosclerotic heart disease of native coronary artery without angina pectoris: Secondary | ICD-10-CM | POA: Diagnosis not present

## 2021-07-29 LAB — MAGNESIUM: Magnesium: 1.5 mg/dL — ABNORMAL LOW (ref 1.7–2.4)

## 2021-07-29 LAB — BASIC METABOLIC PANEL
Anion gap: 6 (ref 5–15)
BUN: 21 mg/dL — ABNORMAL HIGH (ref 6–20)
CO2: 27 mmol/L (ref 22–32)
Calcium: 8.6 mg/dL — ABNORMAL LOW (ref 8.9–10.3)
Chloride: 92 mmol/L — ABNORMAL LOW (ref 98–111)
Creatinine, Ser: 0.7 mg/dL (ref 0.61–1.24)
GFR, Estimated: >60 mL/min (ref >60)
Glucose, Bld: 171 mg/dL — ABNORMAL HIGH (ref 70–99)
Potassium: 5 mmol/L (ref 3.5–5.1)
Sodium: 125 mmol/L — ABNORMAL LOW (ref 135–145)

## 2021-07-29 LAB — CBC
HCT: 35.5 % — ABNORMAL LOW (ref 39.0–52.0)
Hemoglobin: 12.5 g/dL — ABNORMAL LOW (ref 13.0–17.0)
MCH: 35.4 pg — ABNORMAL HIGH (ref 26.0–34.0)
MCHC: 35.2 g/dL (ref 30.0–36.0)
MCV: 100.6 fL — ABNORMAL HIGH (ref 80.0–100.0)
Platelets: 235 10*3/uL (ref 150–400)
RBC: 3.53 MIL/uL — ABNORMAL LOW (ref 4.22–5.81)
RDW: 12.8 % (ref 11.5–15.5)
WBC: 6.3 10*3/uL (ref 4.0–10.5)
nRBC: 0 % (ref 0.0–0.2)

## 2021-07-29 LAB — RESP PANEL BY RT-PCR (FLU A&B, COVID) ARPGX2
Influenza A by PCR: NEGATIVE
Influenza B by PCR: NEGATIVE
SARS Coronavirus 2 by RT PCR: NEGATIVE

## 2021-07-29 MED ORDER — FOLIC ACID 1 MG PO TABS: 1.0000 mg | ORAL_TABLET | Freq: Every day | ORAL | Status: DC

## 2021-07-29 MED ORDER — LORAZEPAM 1 MG PO TABS
1.0000 mg | ORAL_TABLET | ORAL | Status: DC | PRN
  Administered 2021-07-29: 1 mg via ORAL
  Filled 2021-07-29: qty 1

## 2021-07-29 MED ORDER — GABAPENTIN 400 MG PO CAPS
800.0000 mg | ORAL_CAPSULE | Freq: Four times a day (QID) | ORAL | Status: DC
  Filled 2021-07-29 (×3): qty 2

## 2021-07-29 MED ORDER — TRAMADOL HCL 50 MG PO TABS
50.0000 mg | ORAL_TABLET | Freq: Three times a day (TID) | ORAL | Status: DC | PRN
  Administered 2021-07-29: 50 mg via ORAL
  Filled 2021-07-29: qty 1

## 2021-07-29 MED ORDER — ACETAMINOPHEN 325 MG PO TABS: 650.0000 mg | ORAL_TABLET | Freq: Four times a day (QID) | ORAL | Status: DC | PRN

## 2021-07-29 MED ORDER — ACETAMINOPHEN 650 MG RE SUPP
650.0000 mg | Freq: Four times a day (QID) | RECTAL | Status: DC | PRN
  Filled 2021-07-29: qty 1

## 2021-07-29 MED ORDER — GABAPENTIN 400 MG PO CAPS
800.0000 mg | ORAL_CAPSULE | Freq: Four times a day (QID) | ORAL | Status: DC
  Administered 2021-07-29 – 2021-07-30 (×2): 800 mg via ORAL
  Filled 2021-07-29 (×2): qty 2

## 2021-07-29 MED ORDER — GABAPENTIN 800 MG PO TABS
800.0000 mg | ORAL_TABLET | Freq: Four times a day (QID) | ORAL | Status: DC
  Filled 2021-07-29 (×2): qty 1

## 2021-07-29 MED ORDER — MAGNESIUM SULFATE 4 GM/100ML IV SOLN
4.0000 g | Freq: Once | INTRAVENOUS | Status: AC
  Administered 2021-07-29: 4 g via INTRAVENOUS
  Filled 2021-07-29: qty 100

## 2021-07-29 MED ORDER — INSULIN ASPART 100 UNIT/ML IJ SOLN: 0.0000 [IU] | Freq: Three times a day (TID) | INTRAMUSCULAR | Status: DC

## 2021-07-29 MED ORDER — ADULT MULTIVITAMIN W/MINERALS CH
1.0000 | ORAL_TABLET | Freq: Every day | ORAL | Status: DC
  Administered 2021-07-29 – 2021-07-30 (×2): 1 via ORAL
  Filled 2021-07-29 (×2): qty 1

## 2021-07-29 MED ORDER — MAGNESIUM HYDROXIDE 400 MG/5ML PO SUSP: 30.0000 mL | Freq: Every day | ORAL | Status: DC | PRN

## 2021-07-29 MED ORDER — SODIUM CHLORIDE 0.9 % IV SOLN: INTRAVENOUS | Status: DC

## 2021-07-29 MED ORDER — ROSUVASTATIN CALCIUM 10 MG PO TABS
20.0000 mg | ORAL_TABLET | Freq: Every day | ORAL | Status: DC
  Administered 2021-07-29: 20 mg via ORAL
  Filled 2021-07-29: qty 2

## 2021-07-29 MED ORDER — THIAMINE HCL 100 MG PO TABS
100.0000 mg | ORAL_TABLET | Freq: Every day | ORAL | Status: DC
  Administered 2021-07-30: 09:00:00 100 mg via ORAL
  Filled 2021-07-29: qty 1

## 2021-07-29 MED ORDER — FOLIC ACID 1 MG PO TABS
1.0000 mg | ORAL_TABLET | Freq: Every day | ORAL | Status: DC
  Administered 2021-07-29 – 2021-07-30 (×2): 1 mg via ORAL
  Filled 2021-07-29 (×2): qty 1

## 2021-07-29 MED ORDER — TRAZODONE HCL 50 MG PO TABS: 25.0000 mg | ORAL_TABLET | Freq: Every evening | ORAL | Status: DC | PRN

## 2021-07-29 MED ORDER — ONDANSETRON HCL 4 MG/2ML IJ SOLN: 4.0000 mg | Freq: Four times a day (QID) | INTRAMUSCULAR | Status: DC | PRN

## 2021-07-29 MED ORDER — NICOTINE 21 MG/24HR TD PT24
21.0000 mg | MEDICATED_PATCH | Freq: Every day | TRANSDERMAL | Status: DC
  Administered 2021-07-29 – 2021-07-30 (×2): 21 mg via TRANSDERMAL
  Filled 2021-07-29 (×2): qty 1

## 2021-07-29 MED ORDER — CARVEDILOL 12.5 MG PO TABS
12.5000 mg | ORAL_TABLET | Freq: Two times a day (BID) | ORAL | Status: DC
  Administered 2021-07-30: 08:00:00 12.5 mg via ORAL
  Filled 2021-07-29: qty 1

## 2021-07-29 MED ORDER — ONDANSETRON HCL 4 MG PO TABS: 4.0000 mg | ORAL_TABLET | Freq: Four times a day (QID) | ORAL | Status: DC | PRN

## 2021-07-29 MED ORDER — ISOSORBIDE MONONITRATE ER 30 MG PO TB24
30.0000 mg | ORAL_TABLET | Freq: Every day | ORAL | Status: DC
  Administered 2021-07-29 – 2021-07-30 (×2): 30 mg via ORAL
  Filled 2021-07-29 (×2): qty 1

## 2021-07-29 MED ORDER — LORAZEPAM 2 MG/ML IJ SOLN: 1.0000 mg | INTRAMUSCULAR | Status: DC | PRN

## 2021-07-29 MED ORDER — ENOXAPARIN SODIUM 40 MG/0.4ML IJ SOSY
40.0000 mg | PREFILLED_SYRINGE | INTRAMUSCULAR | Status: DC
  Administered 2021-07-29 – 2021-07-30 (×2): 40 mg via SUBCUTANEOUS
  Filled 2021-07-29 (×2): qty 0.4

## 2021-07-29 MED ORDER — EZETIMIBE 10 MG PO TABS
10.0000 mg | ORAL_TABLET | Freq: Every day | ORAL | Status: DC
  Administered 2021-07-29 – 2021-07-30 (×2): 10 mg via ORAL
  Filled 2021-07-29 (×2): qty 1

## 2021-07-29 MED ORDER — CLOPIDOGREL BISULFATE 75 MG PO TABS
75.0000 mg | ORAL_TABLET | Freq: Every day | ORAL | Status: DC
  Administered 2021-07-29 – 2021-07-30 (×2): 75 mg via ORAL
  Filled 2021-07-29 (×2): qty 1

## 2021-07-29 MED ORDER — THIAMINE HCL 100 MG/ML IJ SOLN
100.0000 mg | Freq: Every day | INTRAMUSCULAR | Status: DC
  Administered 2021-07-29: 100 mg via INTRAVENOUS
  Filled 2021-07-29: qty 2

## 2021-07-29 NOTE — ED Notes (Signed)
Pt not visualized in room at this time. CN checked lobby & parking lot and called pt's name repeatedly. This RN attempted to call pt's number on file with no response. MD made aware that pt eloped at this time.

## 2021-07-29 NOTE — Progress Notes (Signed)
PROGRESS NOTE    Tyler Martinez  CBU:[id number] DOB: 1971-02-22 DOA: 07/29/2021 PCP: Olin Hauser, DO    Brief Narrative:  Tyler Martinez is a 50 y.o. Caucasian male with medical history significant for coronary artery disease, type  2 diabetes mellitus, hypertension and dyslipidemia as well as CVA, who presented to the ER with hyponatremia with a sodium of 118 that was checked by his primary care physician yesterday and was called about it today.  He had nausea vomiting and diarrhea 2 days ago that have resolved.    Assessment & Plan:   Principal Problem:   Hyponatremia  Hyponatremia secondary to dehydration. Patient had a nausea vomiting and diarrhea for about 4 days.  He has acute hyponatremia from dehydration. Will continue IV fluids, recheck sodium level today and tomorrow.  Patient had a sodium level 129 a month ago, patient has a chronic hyponatremia.  We will follow sodium level closely. Patient has a normal renal function, potassium level elevated, probably a lab error.  Alcohol use disorder. Currently patient does not have any confusion, no agitation.  Continue CIWA protocol pain  Essential hypertension. Continue home medicines  Type 2 diabetes Hold off metformin, continue sliding scale insulin.  Coronary disease. Stable, continue home medicines.  Tobacco abuse. Advised to quit, started nicotine patch.   DVT prophylaxis: Lovenox Code Status: full Family Communication:  Disposition Plan:    Status is: Observation         No intake/output data recorded. Total I/O In: -  Out: 500 [Urine:500]     Subjective: Patient left the emergency room and went to the parking lot to smoke.  Then came back to the hospital. Currently he feels well, denies any abdominal pain or nausea vomiting.  No additional diarrhea No fever chills No short of breath or cough.  Objective: Vitals:   07/29/21 0426 07/29/21 0441 07/29/21 0443 07/29/21 0456  BP:  (!)  147/108  (!) 145/94  Pulse:  69    Resp:  20  15  Temp:  98 F (36.7 C)  98.2 F (36.8 C)  TempSrc:  Oral  Oral  SpO2:  96%  99%  Weight: 93.9 kg  93.9 kg   Height: 6' (1.829 m)  6' (1.829 m)     Intake/Output Summary (Last 24 hours) at 07/29/2021 1046 Last data filed at 07/29/2021 0900 Gross per 24 hour  Intake --  Output 500 ml  Net -500 ml   Filed Weights   07/29/21 0426 07/29/21 0443  Weight: 93.9 kg 93.9 kg    Examination:  General exam: Appears calm and comfortable  Respiratory system: Clear to auscultation. Respiratory effort normal. Cardiovascular system: S1 & S2 heard, RRR. No JVD, murmurs, rubs, gallops or clicks. No pedal edema. Gastrointestinal system: Abdomen is nondistended, soft and nontender. No organomegaly or masses felt. Normal bowel sounds heard. Central nervous system: Alert and oriented. No focal neurological deficits. Extremities: Symmetric 5 x 5 power. Skin: No rashes, lesions or ulcers Psychiatry: Judgement and insight appear normal. Mood & affect appropriate.     Data Reviewed: I have personally reviewed following labs and imaging studies  CBC: Recent Labs  Lab 07/27/21 1001 07/28/21 1725  WBC 7.5 8.4  HGB 15.2 13.4  HCT 43.9 38.2*  MCV 100.5* 98.2  PLT 280 032   Basic Metabolic Panel: Recent Labs  Lab 07/27/21 1001 07/28/21 1725  NA 118* 123*  K 4.6 5.3*  CL <80* 91*  CO2 30 27  GLUCOSE 166* 127*  BUN 17 27*  CREATININE 0.85 0.76  CALCIUM 9.1 9.0  MG  --  1.8   GFR: Estimated Creatinine Clearance: 132.9 mL/min (by C-G formula based on SCr of 0.76 mg/dL). Liver Function Tests: Recent Labs  Lab 07/27/21 1001 07/28/21 1725  AST 16 17  ALT 13 16  ALKPHOS  --  57  BILITOT 0.6 0.6  PROT 6.6 6.7  ALBUMIN  --  4.1   No results for input(s): LIPASE, AMYLASE in the last 168 hours. No results for input(s): AMMONIA in the last 168 hours. Coagulation Profile: No results for input(s): INR, PROTIME in the last 168  hours. Cardiac Enzymes: No results for input(s): CKTOTAL, CKMB, CKMBINDEX, TROPONINI in the last 168 hours. BNP (last 3 results) No results for input(s): PROBNP in the last 8760 hours. HbA1C: No results for input(s): HGBA1C in the last 72 hours. CBG: No results for input(s): GLUCAP in the last 168 hours. Lipid Profile: No results for input(s): CHOL, HDL, LDLCALC, TRIG, CHOLHDL, LDLDIRECT in the last 72 hours. Thyroid Function Tests: Recent Labs    07/28/21 1725  TSH 2.368   Anemia Panel: No results for input(s): VITAMINB12, FOLATE, FERRITIN, TIBC, IRON, RETICCTPCT in the last 72 hours. Sepsis Labs: No results for input(s): PROCALCITON, LATICACIDVEN in the last 168 hours.  Recent Results (from the past 240 hour(s))  Resp Panel by RT-PCR (Flu A&B, Covid) Nasopharyngeal Swab     Status: None   Collection Time: 07/28/21 11:20 PM   Specimen: Nasopharyngeal Swab; Nasopharyngeal(NP) swabs in vial transport medium  Result Value Ref Range Status   SARS Coronavirus 2 by RT PCR NEGATIVE NEGATIVE Final    Comment: (NOTE) SARS-CoV-2 target nucleic acids are NOT DETECTED.  The SARS-CoV-2 RNA is generally detectable in upper respiratory specimens during the acute phase of infection. The lowest concentration of SARS-CoV-2 viral copies this assay can detect is 138 copies/mL. A negative result does not preclude SARS-Cov-2 infection and should not be used as the sole basis for treatment or other patient management decisions. A negative result may occur with  improper specimen collection/handling, submission of specimen other than nasopharyngeal swab, presence of viral mutation(s) within the areas targeted by this assay, and inadequate number of viral copies(<138 copies/mL). A negative result must be combined with clinical observations, patient history, and epidemiological information. The expected result is Negative.  Fact Sheet for Patients:   EntrepreneurPulse.com.au  Fact Sheet for Healthcare Providers:  IncredibleEmployment.be  This test is no t yet approved or cleared by the [place] FDA and  has been authorized for detection and/or diagnosis of SARS-CoV-2 by FDA under an Emergency Use Authorization (EUA). This EUA will remain  in effect (meaning this test can be used) for the duration of the COVID-19 declaration under Section 564(b)(1) of the Act, 21 U.S.C.section 360bbb-3(b)(1), unless the authorization is terminated  or revoked sooner.       Influenza A by PCR NEGATIVE NEGATIVE Final   Influenza B by PCR NEGATIVE NEGATIVE Final    Comment: (NOTE) The Xpert Xpress SARS-CoV-2/FLU/RSV plus assay is intended as an aid in the diagnosis of influenza from Nasopharyngeal swab specimens and should not be used as a sole basis for treatment. Nasal washings and aspirates are unacceptable for Xpert Xpress SARS-CoV-2/FLU/RSV testing.  Fact Sheet for Patients: EntrepreneurPulse.com.au  Fact Sheet for Healthcare Providers: IncredibleEmployment.be  This test is not yet approved or cleared by the [place] FDA and has been authorized for detection and/or diagnosis of SARS-CoV-2 by FDA under  an Emergency Use Authorization (EUA). This EUA will remain in effect (meaning this test can be used) for the duration of the COVID-19 declaration under Section 564(b)(1) of the Act, 21 U.S.C. section 360bbb-3(b)(1), unless the authorization is terminated or revoked.  Performed at [facility], [address] [place], [place] [address]          Radiology Studies: CT Head Wo Contrast  Result Date: 07/28/2021 CLINICAL DATA:  Headache, intracranial hemorrhage suspected EXAM: CT HEAD WITHOUT CONTRAST TECHNIQUE: Contiguous axial images were obtained from the base of the skull through the vertex without intravenous contrast. COMPARISON:  None.  FINDINGS: Brain: There is no acute intracranial hemorrhage, mass effect, or edema. No acute appearing loss of gray-white differentiation. Chronic infarcts of the right greater than left occipital lobes and cerebellum. There is no extra-axial fluid collection. Ventricles and sulci are within normal limits in size and configuration. Vascular: There is mild atherosclerotic calcification at the skull base. Skull: Calvarium is unremarkable. Sinuses/Orbits: No acute finding. Other: None. IMPRESSION: No acute intracranial abnormality.  Chronic infarcts detailed above. Electronically Signed   By: Macy Mis M.D.   On: 07/28/2021 18:09        Scheduled Meds:  enoxaparin (LOVENOX) injection  40 mg Subcutaneous V37S   folic acid  1 mg Oral Daily   multivitamin with minerals  1 tablet Oral Daily   nicotine  21 mg Transdermal Daily   thiamine  100 mg Oral Daily   Or   thiamine  100 mg Intravenous Daily   Continuous Infusions:  sodium chloride 100 mL/hr at 07/29/21 8270     LOS: 0 days    Time spent:     Sharen Hones, MD [facility]   To contact the attending provider between 7A-7P or the covering provider during after hours 7P-7A, please log into the web site [email or web address] and access using universal [place] password for that web site. If you do not have the password, please call the hospital operator.  07/29/2021, 10:46 AM

## 2021-07-29 NOTE — ED Triage Notes (Signed)
Pt reports his sodium was low 118, MD recommended to come to ER. Pt was in process to be admitted and was not found in room for admission.

## 2021-07-29 NOTE — H&P (Signed)
[place]   PATIENT NAME: Tyler Martinez    MR#:  [id number]  DATE OF BIRTH:  1971/06/25  DATE OF ADMISSION:  07/28/2021  PRIMARY CARE PHYSICIAN: Olin Hauser, DO   Patient is coming from: Home  REQUESTING/REFERRING PHYSICIAN: Duffy Bruce, MD  CHIEF COMPLAINT:   Chief Complaint  Patient presents with   Hyponatremia     HISTORY OF PRESENT ILLNESS:  Tyler Martinez is a 50 y.o. Caucasian male with medical history significant for coronary artery disease, type  2 diabetes mellitus, hypertension and dyslipidemia as well as CVA, who presented to the ER with hyponatremia with a sodium of 118 that was checked by his primary care physician yesterday and was called about it today.  He had nausea vomiting and diarrhea 2 days ago that have resolved.  He admits to current lightheadedness and dizziness with associated headache.  His last alcoholic drink was 2 days ago.  Usually drinks an amount of a pint per day.  He denies any cough or wheezing or dyspnea.  No fever or chills.  No chest pain or palpitations.  During the interview he was starting to feel slightly jittery and nervous.  ED Course: Upon presentation to the ER, blood pressure was 146/106 with otherwise normal vital signs labs revealed hyponatremia with a sodium of 123 potassium 5.3 with a BUN of 27 glucose of 127 and chloride 91.  Serum osmolality was 273 and CBC showed no acute abnormalities.  TSH came back 2.36.  Influenza antigens and COVID-19 PCR came back negative.  Alcohol level was less than 10. EKG as reviewed by me : Pending. Imaging: Noncontrasted head CT scan revealed chronic infarcts with no acute intracranial normalities.  The patient was given 500 mill IV normal saline bolus, 1 mg of IV Ativan, hydration with IV normal saline maintenance 100 mill per hour, and IV thiamine 100 mg.  He will be admitted to a medically monitored bed for further evaluation and management. PAST MEDICAL HISTORY:   Past  Medical History:  Diagnosis Date   Anxiety    Arthritis    Cardiac arrest ([place]) 2008   Coronary artery disease    Diabetes mellitus without complication ([place])    Heart attack ([place])    Hyperlipidemia    Hypertension    MI (myocardial infarction) ([place])    Stroke ([place])     PAST SURGICAL HISTORY:   Past Surgical History:  Procedure Laterality Date   CAROTID STENT  2008   EXPLORATION POST OPERATIVE OPEN HEART  2008   HERNIA REPAIR  2008   inguinal   LEFT HEART CATH AND CORONARY ANGIOGRAPHY N/A 05/19/2021   Procedure: LEFT HEART CATH AND CORONARY ANGIOGRAPHY;  Surgeon: Wellington Hampshire, MD;  Location: [facility];  Service: Cardiovascular;  Laterality: N/A;    SOCIAL HISTORY:   Social History   Tobacco Use   Smoking status: Every Day    Packs/day: 1.00    Years: 8.00    Pack years: 8.00    Types: Cigarettes   Smokeless tobacco: Never  Substance Use Topics   Alcohol use: Yes    Alcohol/week: 2.0 standard drinks    Types: 2 Standard drinks or equivalent per week    Comment: Drinks mixed drinks every evening.    FAMILY HISTORY:   Family History  Problem Relation Age of Onset   Cancer Mother    Hyperlipidemia Father    Hypertension Father    Heart attack Father 48  Diabetes Father    Hypertension Brother     DRUG ALLERGIES:   Allergies  Allergen Reactions   Leflunomide Nausea And Vomiting   Methotrexate Derivatives Nausea And Vomiting    Have RA but can not tolerate any  Of the med  No components found with this name: [id number] saw Rheumatology in 2005   Other Nausea And Vomiting    Have RA but can not tolerate any  Of the med  No components found with this name: [id number] saw Rheumatology in 2005    REVIEW OF SYSTEMS:   ROS As per history of present illness. All pertinent systems were reviewed above. Constitutional, HEENT, cardiovascular, respiratory, GI, GU, musculoskeletal, neuro, psychiatric, endocrine, integumentary and hematologic  systems were reviewed and are otherwise negative/unremarkable except for positive findings mentioned above in the HPI.   MEDICATIONS AT HOME:   Prior to Admission medications   Medication Sig Start Date End Date Taking? Authorizing Provider  Blood Glucose Monitoring Suppl (ONETOUCH VERIO) w/Device KIT Use to check blood sugar up to twice a day. 01/12/20  Yes Karamalegos, Devonne Doughty, DO  clopidogrel (PLAVIX) 75 MG tablet Take 1 tablet (75 mg total) by mouth daily. 02/06/21  Yes Karamalegos, Devonne Doughty, DO  gabapentin (NEURONTIN) 800 MG tablet TAKE 1 TABLET BY MOUTH 4 TIMES DAILY. 06/27/21  Yes Karamalegos, Devonne Doughty, DO  isosorbide mononitrate (IMDUR) 30 MG 24 hr tablet Take 1 tablet (30 mg total) by mouth daily. 05/25/21  Yes Furth, Cadence H, PA-C  Lancets (ONETOUCH ULTRASOFT) lancets Use to check blood sugar up to twice a day. 01/12/20  Yes Karamalegos, Devonne Doughty, DO  lisinopril (ZESTRIL) 40 MG tablet Take 1 tablet (40 mg total) by mouth daily. 07/27/21 08/26/21 Yes Jearld Fenton, NP  metFORMIN (GLUCOPHAGE) 1000 MG tablet Take 1 tablet (1,000 mg total) by mouth 2 (two) times daily with a meal. 02/06/21  Yes Karamalegos, Devonne Doughty, DO  Multiple Vitamin (MULTIVITAMIN WITH MINERALS) TABS tablet Take 1 tablet by mouth daily. 06/28/21  Yes Enzo Bi, MD  [facility] VERIO test strip Use to check blood sugar up to twice a day. 01/12/20  Yes Karamalegos, Devonne Doughty, DO  traMADol (ULTRAM) 50 MG tablet Take 1 tablet (50 mg total) by mouth every 8 (eight) hours as needed. 06/05/21  Yes Karamalegos, Devonne Doughty, DO  carvedilol (COREG) 12.5 MG tablet Take 1 tablet (12.5 mg total) by mouth 2 (two) times daily with a meal. 05/20/21 06/26/21  Ralene Muskrat B, MD  ezetimibe (ZETIA) 10 MG tablet Take 1 tablet (10 mg total) by mouth daily. 05/21/21 06/26/21  Sidney Ace, MD  folic acid (FOLVITE) 1 MG tablet Take 1 tablet (1 mg total) by mouth daily. Patient not taking: Reported on 07/27/2021 06/28/21    Enzo Bi, MD  hydrOXYzine (VISTARIL) 25 MG capsule TAKE 1 CAPSULE (25 MG TOTAL) BY MOUTH EVERY 8 (EIGHT) HOURS AS NEEDED FOR ANXIETY. Patient not taking: Reported on 07/27/2021 06/12/21   Olin Hauser, DO  nicotine (NICODERM CQ - DOSED IN MG/24 HOURS) 21 mg/24hr patch Place 1 patch (21 mg total) onto the skin daily. 05/21/21   Sidney Ace, MD  rosuvastatin (CRESTOR) 20 MG tablet Take 1 tablet (20 mg total) by mouth at bedtime. 05/20/21 06/26/21  Sidney Ace, MD  thiamine 100 MG tablet Take 1 tablet (100 mg total) by mouth daily. Patient not taking: Reported on 07/27/2021 06/28/21   Enzo Bi, MD      VITAL SIGNS:  Blood pressure [name])  143/105, pulse 68, temperature 97.8 F (36.6 C), temperature source Oral, resp. rate 18, height 6' (1.829 m), weight 93.9 kg, SpO2 98 %.  PHYSICAL EXAMINATION:  Physical Exam  GENERAL:  50 y.o.-year-old Caucasian male patient lying in the bed with no acute distress.  He was slightly somnolent but arousable and restless. EYES: Pupils equal, round, reactive to light and accommodation. No scleral icterus. Extraocular muscles intact.  HEENT: Head atraumatic, normocephalic. Oropharynx and nasopharynx clear.  NECK:  Supple, no jugular venous distention. No thyroid enlargement, no tenderness.  LUNGS: Normal breath sounds bilaterally, no wheezing, rales,rhonchi or crepitation. No use of accessory muscles of respiration.  CARDIOVASCULAR: Regular rate and rhythm, S1, S2 normal. No murmurs, rubs, or gallops.  ABDOMEN: Soft, nondistended, nontender. Bowel sounds present. No organomegaly or mass.  EXTREMITIES: No pedal edema, cyanosis, or clubbing.  NEUROLOGIC: Cranial nerves II through XII are intact. Muscle strength 5/5 in all extremities. Sensation intact. Gait not checked.  PSYCHIATRIC: The patient is alert and oriented x 3.  He is slightly somnolent but arousable.  Normal affect and good eye contact. SKIN: No obvious rash, lesion, or ulcer.    LABORATORY PANEL:   CBC Recent Labs  Lab 07/28/21 1725  WBC 8.4  HGB 13.4  HCT 38.2*  PLT 251   ------------------------------------------------------------------------------------------------------------------  Chemistries  Recent Labs  Lab 07/28/21 1725  NA 123*  K 5.3*  CL 91*  CO2 27  GLUCOSE 127*  BUN 27*  CREATININE 0.76  CALCIUM 9.0  MG 1.8  AST 17  ALT 16  ALKPHOS 57  BILITOT 0.6   ------------------------------------------------------------------------------------------------------------------  Cardiac Enzymes No results for input(s): TROPONINI in the last 168 hours. ------------------------------------------------------------------------------------------------------------------  RADIOLOGY:  CT Head Wo Contrast  Result Date: 07/28/2021 CLINICAL DATA:  Headache, intracranial hemorrhage suspected EXAM: CT HEAD WITHOUT CONTRAST TECHNIQUE: Contiguous axial images were obtained from the base of the skull through the vertex without intravenous contrast. COMPARISON:  None. FINDINGS: Brain: There is no acute intracranial hemorrhage, mass effect, or edema. No acute appearing loss of gray-white differentiation. Chronic infarcts of the right greater than left occipital lobes and cerebellum. There is no extra-axial fluid collection. Ventricles and sulci are within normal limits in size and configuration. Vascular: There is mild atherosclerotic calcification at the skull base. Skull: Calvarium is unremarkable. Sinuses/Orbits: No acute finding. Other: None. IMPRESSION: No acute intracranial abnormality.  Chronic infarcts detailed above. Electronically Signed   By: Macy Mis M.D.   On: 07/28/2021 18:09      IMPRESSION AND PLAN:  Principal Problem:   Hyponatremia  1.  Symptomatic hyponatremia, possibly secondary to beer potomania. - The patient be admitted to a medical monitored bed. - We will continue hydration with IV normal saline. - We will obtain  hyponatremia work-up. - We will restrict p.o water intake. - Serial sodium levels will be followed.  2.  Suspected early alcohol withdrawal. - The patient will be placed on CIWA protocol. - We will get thiamine, multivitamins and folic acid.  3.  Essential hypertension. - We will continue Coreg, Zestril and Imdur.  4.  Type 2 diabetes mellitus. - The patient will be placed on supplement coverage with NovoLog and will hold off metformin.  5.  Coronary artery disease. - We will continue his statin therapy, ACE inhibitor therapy and Plavix as well as beta-blocker therapy.  6.  Dyslipidemia. - We will continue statin therapy.  7.  Ongoing tobacco abuse. - He was counseled for smoking cessation and will receive further  counseling here.   DVT prophylaxis: Lovenox. Code Status: full code. Family Communication:  The plan of care was discussed in details with the patient (and family). I answered all questions. The patient agreed to proceed with the above mentioned plan. Further management will depend upon hospital course. Disposition Plan: Back to previous home environment Consults called: none.  All the records are review.  Ed and case discussed with ED provider.  Status is: Inpatient   Remains inpatient appropriate because:Ongoing diagnostic testing needed not appropriate for outpatient work up, Unsafe d/c plan, IV treatments appropriate due to intensity of illness or inability to take PO, and Inpatient level of care appropriate due to severity of illness   Dispo: The patient is from: Home              Anticipated d/c is to: Home              Patient currently is not medically stable to d/c.              Difficult to place patient: No  TOTAL TIME TAKING CARE OF THIS PATIENT: 55 minutes.     Christel Mormon M.D on 07/29/2021 at 1:48 AM  [facility]   From 7 PM-7 AM, contact night-coverage [email or web address]  CC: Primary care physician; Olin Hauser, DO

## 2021-07-29 NOTE — Discharge Summary (Signed)
The patient was admitted on 07/28/2021 with below problems that are similar to discharge diagnosis:  Date of discharge: 07/29/2021  For detailed presentation on admission please refer to dictated admission H&P.  1.  Symptomatic hyponatremia, possibly secondary to beer potomania. - The patient be admitted to a medical monitored bed. - We will continue hydration with IV normal saline. - We will obtain hyponatremia work-up. - We will restrict p.o water intake. - Serial sodium levels will be followed.  2.  Suspected early alcohol withdrawal. - The patient will be placed on CIWA protocol. - We will get thiamine, multivitamins and folic acid.  3.  Essential hypertension. - We will continue Coreg, Zestril and Imdur.  4.  Type 2 diabetes mellitus. - The patient will be placed on supplement coverage with NovoLog and will hold off metformin.  5.  Coronary artery disease. - We will continue his statin therapy, ACE inhibitor therapy and Plavix as well as beta-blocker therapy.  6.  Dyslipidemia. - We will continue statin therapy.  7.  Ongoing tobacco abuse. - He was counseled for smoking cessation and will receive further counseling here.     DVT prophylaxis: Lovenox. Code Status: full code. Family Communication:  The plan of care was discussed in details with the patient (and family). I answered all questions. The patient agreed to proceed with the above mentioned plan. Further management will depend upon hospital course. Disposition Plan: Back to previous home environment Consults called: none.  All the records are review.  Ed and case discussed with ED provider.   Status is: Inpatient   Hospital course: As above and medical problems.    Disposition: The patient eloped from the ER within few hours of admission without signing AMA.

## 2021-07-29 NOTE — H&P (Signed)
[place]   PATIENT NAME: Tyler Martinez    MR#:  [id number]  DATE OF BIRTH:  Nov 29, 1970  DATE OF ADMISSION:  07/29/2021  PRIMARY CARE PHYSICIAN: Olin Hauser, DO   Patient is coming from: Home  REQUESTING/REFERRING PHYSICIAN: Arta Silence, MD  CHIEF COMPLAINT:   Chief Complaint  Patient presents with   Emesis    HISTORY OF PRESENT ILLNESS:  Tyler Martinez is a 50 y.o. Caucasian male with medical history significant for ongoing alcohol abuse and dependence, anxiety, osteoarthritis, coronary artery disease, type  2 diabetes mellitus, hypertension and dyslipidemia as well as CVA who presented to the ER with hyponatremia with a sodium of 118 that was checked by his primary care physician yesterday and was called about it today.  He had nausea vomiting and diarrhea 2 days ago that have resolved.  He admits to current lightheadedness and dizziness with associated headache.  His last alcoholic drink was 2 days ago.  Usually drinks an amount of a pint per day.  He denies any cough or wheezing or dyspnea.  No fever or chills.  No chest pain or palpitations.  During the interview he was starting to feel slightly jittery and nervous.  He was managed with as needed IV Ativan in the ER and despite that managed to elope from the ER for an hour and a half and came back to be readmitted.  ED Course: When he came back blood pressure was 145/94 with a heart rate of 77 respiratory rate of 15 temperature 98.1 pulse oximetry 99% on room air.  Initial BMP showed sodium of 123.  Repeat BMP is currently pending. Earlier blood work showed: Serum osmolality was 273 and CBC showed no acute abnormalities.  TSH came back 2.36.  Influenza antigens and COVID-19 PCR came back negative.  Alcohol level was less than 10.  His urine sodium came back 33. EKG as reviewed by me : Pending. Imaging: Noncontrasted head CT scan revealed chronic infarcts with no acute intracranial normalities  Initially  he was given 500 mill IV normal saline bolus, 1 mg of IV Ativan that was later repeated, hydration with IV normal saline maintenance 100 mill per hour, and IV thiamine 100 mg.  He will be re-admitted to a medically monitored observation bed for further evaluation and management.  PAST MEDICAL HISTORY:   Past Medical History:  Diagnosis Date   Anxiety    Arthritis    Cardiac arrest ([place]) 2008   Coronary artery disease    Diabetes mellitus without complication ([place])    Heart attack ([place])    Hyperlipidemia    Hypertension    MI (myocardial infarction) ([place])    Stroke ([place])     PAST SURGICAL HISTORY:   Past Surgical History:  Procedure Laterality Date   CAROTID STENT  2008   EXPLORATION POST OPERATIVE OPEN HEART  2008   HERNIA REPAIR  2008   inguinal   LEFT HEART CATH AND CORONARY ANGIOGRAPHY N/A 05/19/2021   Procedure: LEFT HEART CATH AND CORONARY ANGIOGRAPHY;  Surgeon: Wellington Hampshire, MD;  Location: [facility];  Service: Cardiovascular;  Laterality: N/A;    SOCIAL HISTORY:   Social History   Tobacco Use   Smoking status: Every Day    Packs/day: 1.00    Years: 8.00    Pack years: 8.00    Types: Cigarettes   Smokeless tobacco: Never  Substance Use Topics   Alcohol use: Yes    Alcohol/week:  2.0 standard drinks    Types: 2 Standard drinks or equivalent per week    Comment: Drinks mixed drinks every evening.    FAMILY HISTORY:   Family History  Problem Relation Age of Onset   Cancer Mother    Hyperlipidemia Father    Hypertension Father    Heart attack Father 55   Diabetes Father    Hypertension Brother     DRUG ALLERGIES:   Allergies  Allergen Reactions   Leflunomide Nausea And Vomiting   Methotrexate Derivatives Nausea And Vomiting    Have RA but can not tolerate any  Of the med  No components found with this name: [id number] saw Rheumatology in 2005   Other Nausea And Vomiting    Have RA but can not tolerate any  Of the med  No components  found with this name: PROTEIN24HR saw Rheumatology in 2005    REVIEW OF SYSTEMS:   ROS As per history of present illness. All pertinent systems were reviewed above. Constitutional, HEENT, cardiovascular, respiratory, GI, GU, musculoskeletal, neuro, psychiatric, endocrine, integumentary and hematologic systems were reviewed and are otherwise negative/unremarkable except for positive findings mentioned above in the HPI.   MEDICATIONS AT HOME:   Prior to Admission medications   Medication Sig Start Date End Date Taking? Authorizing Provider  Blood Glucose Monitoring Suppl (ONETOUCH VERIO) w/Device KIT Use to check blood sugar up to twice a day. 01/12/20   Karamalegos, Devonne Doughty, DO  carvedilol (COREG) 12.5 MG tablet Take 1 tablet (12.5 mg total) by mouth 2 (two) times daily with a meal. 05/20/21 06/26/21  Ralene Muskrat B, MD  clopidogrel (PLAVIX) 75 MG tablet Take 1 tablet (75 mg total) by mouth daily. 02/06/21   Karamalegos, Devonne Doughty, DO  ezetimibe (ZETIA) 10 MG tablet Take 1 tablet (10 mg total) by mouth daily. 05/21/21 06/26/21  Sidney Ace, MD  folic acid (FOLVITE) 1 MG tablet Take 1 tablet (1 mg total) by mouth daily. Patient not taking: Reported on 07/27/2021 06/28/21   Enzo Bi, MD  gabapentin (NEURONTIN) 800 MG tablet TAKE 1 TABLET BY MOUTH 4 TIMES DAILY. 06/27/21   Karamalegos, Devonne Doughty, DO  hydrOXYzine (VISTARIL) 25 MG capsule TAKE 1 CAPSULE (25 MG TOTAL) BY MOUTH EVERY 8 (EIGHT) HOURS AS NEEDED FOR ANXIETY. Patient not taking: Reported on 07/27/2021 06/12/21   Olin Hauser, DO  isosorbide mononitrate (IMDUR) 30 MG 24 hr tablet Take 1 tablet (30 mg total) by mouth daily. 05/25/21   Furth, Cadence H, PA-C  Lancets (ONETOUCH ULTRASOFT) lancets Use to check blood sugar up to twice a day. 01/12/20   Karamalegos, Devonne Doughty, DO  lisinopril (ZESTRIL) 40 MG tablet Take 1 tablet (40 mg total) by mouth daily. 07/27/21 08/26/21  Jearld Fenton, NP  metFORMIN (GLUCOPHAGE)  1000 MG tablet Take 1 tablet (1,000 mg total) by mouth 2 (two) times daily with a meal. 02/06/21   Karamalegos, Devonne Doughty, DO  Multiple Vitamin (MULTIVITAMIN WITH MINERALS) TABS tablet Take 1 tablet by mouth daily. 06/28/21   Enzo Bi, MD  nicotine (NICODERM CQ - DOSED IN MG/24 HOURS) 21 mg/24hr patch Place 1 patch (21 mg total) onto the skin daily. 05/21/21   Sidney Ace, MD  ONETOUCH VERIO test strip Use to check blood sugar up to twice a day. 01/12/20   Karamalegos, Devonne Doughty, DO  rosuvastatin (CRESTOR) 20 MG tablet Take 1 tablet (20 mg total) by mouth at bedtime. 05/20/21 06/26/21  Sidney Ace, MD  thiamine  100 MG tablet Take 1 tablet (100 mg total) by mouth daily. Patient not taking: Reported on 07/27/2021 06/28/21   Enzo Bi, MD  traMADol (ULTRAM) 50 MG tablet Take 1 tablet (50 mg total) by mouth every 8 (eight) hours as needed. 06/05/21   Karamalegos, Devonne Doughty, DO      VITAL SIGNS:  Blood pressure (!) 145/94, pulse 69, temperature 98.2 F (36.8 C), temperature source Oral, resp. rate 15, height 6' (1.829 m), weight 93.9 kg, SpO2 99 %.  PHYSICAL EXAMINATION:  Physical Exam  GENERAL:  50 y.o.-year-old Caucasian male patient lying in the bed with no acute distress.  EYES: Pupils equal, round, reactive to light and accommodation. No scleral icterus. Extraocular muscles intact.  HEENT: Head atraumatic, normocephalic. Oropharynx and nasopharynx clear.  NECK:  Supple, no jugular venous distention. No thyroid enlargement, no tenderness.  LUNGS: Normal breath sounds bilaterally, no wheezing, rales,rhonchi or crepitation. No use of accessory muscles of respiration.  CARDIOVASCULAR: Regular rate and rhythm, S1, S2 normal. No murmurs, rubs, or gallops.  ABDOMEN: Soft, nondistended, nontender. Bowel sounds present. No organomegaly or mass.  EXTREMITIES: No pedal edema, cyanosis, or clubbing.  NEUROLOGIC: Cranial nerves II through XII are intact. Muscle strength 5/5 in all  extremities. Sensation intact. Gait not checked.  PSYCHIATRIC: The patient is alert and oriented x 3.  Normal affect and good eye contact. SKIN: No obvious rash, lesion, or ulcer.   LABORATORY PANEL:   CBC Recent Labs  Lab 07/28/21 1725  WBC 8.4  HGB 13.4  HCT 38.2*  PLT 251   ------------------------------------------------------------------------------------------------------------------  Chemistries  Recent Labs  Lab 07/28/21 1725  NA 123*  K 5.3*  CL 91*  CO2 27  GLUCOSE 127*  BUN 27*  CREATININE 0.76  CALCIUM 9.0  MG 1.8  AST 17  ALT 16  ALKPHOS 57  BILITOT 0.6   ------------------------------------------------------------------------------------------------------------------  Cardiac Enzymes No results for input(s): TROPONINI in the last 168 hours. ------------------------------------------------------------------------------------------------------------------  RADIOLOGY:  CT Head Wo Contrast  Result Date: 07/28/2021 CLINICAL DATA:  Headache, intracranial hemorrhage suspected EXAM: CT HEAD WITHOUT CONTRAST TECHNIQUE: Contiguous axial images were obtained from the base of the skull through the vertex without intravenous contrast. COMPARISON:  None. FINDINGS: Brain: There is no acute intracranial hemorrhage, mass effect, or edema. No acute appearing loss of gray-white differentiation. Chronic infarcts of the right greater than left occipital lobes and cerebellum. There is no extra-axial fluid collection. Ventricles and sulci are within normal limits in size and configuration. Vascular: There is mild atherosclerotic calcification at the skull base. Skull: Calvarium is unremarkable. Sinuses/Orbits: No acute finding. Other: None. IMPRESSION: No acute intracranial abnormality.  Chronic infarcts detailed above. Electronically Signed   By: Macy Mis M.D.   On: 07/28/2021 18:09      IMPRESSION AND PLAN:  Principal Problem:   Hyponatremia 1.  Symptomatic  hyponatremia, possibly secondary to beer potomania. - The patient be admitted to a medical monitored observation bed. - We will continue hydration with IV normal saline. - We obtained hyponatremia work-up.  He had low serum osmolality of 273. - We will restrict p.o water intake. - Serial sodium levels will be followed.  Repeat BMP is currently pending.  2.  Suspected early alcohol withdrawal. - The patient will be placed on CIWA protocol. - We will get thiamine, multivitamins and folic acid.  3.  Essential hypertension. - We will continue Coreg, Zestril and Imdur.  4.  Type 2 diabetes mellitus. - The patient will be placed  on supplement coverage with NovoLog and will hold off metformin.  5.  Coronary artery disease. - We will continue his statin therapy, ACE inhibitor therapy and Plavix as well as beta-blocker therapy.  6.  Dyslipidemia. - We will continue statin therapy.  7.  Ongoing tobacco abuse. - He was counseled for smoking cessation and will receive further counseling here.     DVT prophylaxis: Lovenox. Code Status: full code. Family Communication:  The plan of care was discussed in details with the patient (and family). I answered all questions. The patient agreed to proceed with the above mentioned plan. Further management will depend upon hospital course. Disposition Plan: Back to previous home environment Consults called: none.  All the records are review.  Ed and case discussed with ED provider.   Status is: Observation     Dispo: The patient is from: Home              Anticipated d/c is to: Home              Patient currently is not medically stable to d/c.              Difficult to place patient: No    TOTAL TIME TAKING CARE OF THIS PATIENT: 45 minutes.     Christel Mormon M.D on 07/29/2021 at 5:27 AM  [facility]   From 7 PM-7 AM, contact night-coverage [email or web address]  CC: Primary care physician; Olin Hauser, DO

## 2021-07-29 NOTE — Plan of Care (Signed)

## 2021-07-29 NOTE — Progress Notes (Signed)
RN explained admission skin assessment to patient and patient refusing to participate. Patient states that he has no open wounds or sores on his body so there is no need to look.

## 2021-07-29 NOTE — ED Provider Notes (Signed)
[facility] Emergency Department Provider Note ____________________________________________   Event Date/Time   First MD Initiated Contact with Patient 07/29/21 [phone]     (approximate)  I have reviewed the triage vital signs and the nursing notes.   HISTORY  Chief Complaint Emesis    HPI Tyler Martinez is a 50 y.o. male with PMH as noted below including diabetes, hypertension, hyperlipidemia and alcohol abuse who presents for readmission for hyponatremia.  The patient had been seen in the ED yesterday evening and lab work-up revealed hyponatremia thought to be due to possible beer potomania.  The patient was admitted to the hospitalist service.  He then left the ED for slightly over an hour, which he states was to go smoke.  The patient adamantly denies drinking any alcohol while he left the ED.  He denies any new symptoms.  He states he has a headache.  Past Medical History:  Diagnosis Date   Anxiety    Arthritis    Cardiac arrest [facility]) 2008   Coronary artery disease    Diabetes mellitus without complication ([place])    Heart attack ([place])    Hyperlipidemia    Hypertension    MI (myocardial infarction) ([place])    Stroke [facility])     Patient Active Problem List   Diagnosis Date Noted   Hyponatremia 06/26/2021   Alcohol withdrawal ([place]) 06/26/2021   Polysubstance abuse ([place]) 06/26/2021   Incidental adrenal cortical adenoma 06/26/2021   Abnormal nuclear stress test    Chest pain 05/18/2021   CAD (coronary artery disease) 05/18/2021   Tobacco abuse 05/18/2021   History of substance abuse ([place]) 01/12/2020   Alcohol dependence ([place]) 01/12/2020   Coronary artery disease involving native coronary artery of native heart without angina pectoris 04/14/2019   Essential hypertension 04/14/2019   Hyperlipidemia associated with type 2 diabetes mellitus ([place]) 04/14/2019   History of cerebrovascular accident (CVA) with residual deficit 04/14/2019   Residual cognitive  deficit as late effect of stroke 04/14/2019   Type 2 diabetes mellitus with vascular disease ([place]) 04/14/2019   Diabetic polyneuropathy associated with type 2 diabetes mellitus ([place]) 04/14/2019   Rheumatoid arthritis involving multiple sites ([place]) 04/14/2019   Flat foot 04/14/2019   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 Authorizing Provider  Blood Glucose Monitoring Suppl (ONETOUCH VERIO) w/Device KIT Use to check blood sugar up to twice a day. 01/12/20   Karamalegos, Devonne Doughty, DO  carvedilol (COREG) 12.5 MG tablet Take 1 tablet (12.5 mg total) by mouth 2 (two) times daily with a meal. 05/20/21 06/26/21  Ralene Muskrat B, MD  clopidogrel (PLAVIX) 75 MG tablet Take 1 tablet (75 mg total) by mouth daily. 02/06/21   Karamalegos, Devonne Doughty, DO  ezetimibe (ZETIA) 10 MG tablet Take 1 tablet (10 mg total) by mouth daily. 05/21/21 06/26/21  Sidney Ace, MD  folic acid (FOLVITE) 1 MG tablet Take 1 tablet (1 mg total) by mouth daily. Patient not taking: Reported on 07/27/2021 06/28/21   Enzo Bi,  MD  gabapentin (NEURONTIN) 800 MG tablet TAKE 1 TABLET BY MOUTH 4 TIMES DAILY. 06/27/21   Karamalegos, Devonne Doughty, DO  hydrOXYzine (VISTARIL) 25 MG capsule TAKE 1 CAPSULE (25 MG TOTAL) BY MOUTH EVERY 8 (EIGHT) HOURS AS NEEDED FOR ANXIETY. Patient not taking: Reported on 07/27/2021 06/12/21   Olin Hauser, DO  isosorbide mononitrate (IMDUR) 30 MG 24 hr tablet Take 1 tablet (30 mg total) by mouth daily. 05/25/21   Furth, Cadence H, PA-C   Lancets (ONETOUCH ULTRASOFT) lancets Use to check blood sugar up to twice a day. 01/12/20   Karamalegos, Devonne Doughty, DO  lisinopril (ZESTRIL) 40 MG tablet Take 1 tablet (40 mg total) by mouth daily. 07/27/21 08/26/21  Jearld Fenton, NP  metFORMIN (GLUCOPHAGE) 1000 MG tablet Take 1 tablet (1,000 mg total) by mouth 2 (two) times daily with a meal. 02/06/21   Karamalegos, Devonne Doughty, DO  Multiple Vitamin (MULTIVITAMIN WITH MINERALS) TABS tablet Take 1 tablet by mouth daily. 06/28/21   Enzo Bi, MD  nicotine (NICODERM CQ - DOSED IN MG/24 HOURS) 21 mg/24hr patch Place 1 patch (21 mg total) onto the skin daily. 05/21/21   Sidney Ace, MD  ONETOUCH VERIO test strip Use to check blood sugar up to twice a day. 01/12/20   Karamalegos, Devonne Doughty, DO  rosuvastatin (CRESTOR) 20 MG tablet Take 1 tablet (20 mg total) by mouth at bedtime. 05/20/21 06/26/21  Sidney Ace, MD  thiamine 100 MG tablet Take 1 tablet (100 mg total) by mouth daily. Patient not taking: Reported on 07/27/2021 06/28/21   Enzo Bi, MD  traMADol (ULTRAM) 50 MG tablet Take 1 tablet (50 mg total) by mouth every 8 (eight) hours as needed. 06/05/21   Karamalegos, Devonne Doughty, DO    Allergies Leflunomide, Methotrexate derivatives, and Other  Family History  Problem Relation Age of Onset   Cancer Mother    Hyperlipidemia Father    Hypertension Father    Heart attack Father 24   Diabetes Father    Hypertension Brother     Social History Social History   Tobacco Use   Smoking status: Every Day    Packs/day: 1.00    Years: 8.00    Pack years: 8.00    Types: Cigarettes   Smokeless tobacco: Never  Vaping Use   Vaping Use: Never used  Substance Use Topics   Alcohol use: Yes    Alcohol/week: 2.0 standard drinks    Types: 2 Standard drinks or equivalent per week    Comment: Drinks mixed drinks every evening.   Drug use: Not Currently    Review of Systems  Constitutional: No fever/chills Eyes: No visual  changes. ENT: No sore throat. Cardiovascular: Denies chest pain. Respiratory: Denies shortness of breath. Gastrointestinal: No vomiting or diarrhea.  Genitourinary: Negative for dysuria.  Musculoskeletal: Negative for back pain. Skin: Negative for rash. Neurological: Positive for headache.  ____________________________________________   PHYSICAL EXAM:  VITAL SIGNS: ED Triage Vitals  Enc Vitals Group     BP 07/29/21 0425 (!) 173/110     Pulse Rate 07/29/21 0425 80     Resp 07/29/21 0425 20     Temp 07/29/21 0425 98 F (36.7 C)     Temp Source 07/29/21 0425 Oral     SpO2 07/29/21 0425 95 %     Weight 07/29/21 0426 207 lb (93.9 kg)     Height 07/29/21 0426 6' (1.829 m)     Head Circumference --  Peak Flow --      Pain Score 07/29/21 0443 9     Pain Loc --      Pain Edu? --      Excl. in [place]? --     Constitutional: Alert and oriented. Well appearing and in no acute distress. Eyes: Conjunctivae are normal.  Head: Atraumatic. Nose: No congestion/rhinnorhea. Mouth/Throat: Mucous membranes are moist.   Neck: Normal range of motion.  Cardiovascular: Normal rate, regular rhythm. Good peripheral circulation. Respiratory: Normal respiratory effort.  No retractions.  Gastrointestinal: No distention.  Musculoskeletal: Extremities warm and well perfused.  Neurologic:  Normal speech and language.  Motor intact in all extremities.  No gross focal neurologic deficits are appreciated.  Skin:  Skin is warm and dry. No rash noted. Psychiatric: Calm and cooperative.  ____________________________________________   LABS (all labs ordered are listed, but only abnormal results are displayed)  Labs Reviewed  BASIC METABOLIC PANEL  MAGNESIUM  CBC   ____________________________________________  EKG   ____________________________________________  RADIOLOGY    ____________________________________________   PROCEDURES  Procedure(s) performed: No  Procedures  Critical  Care performed: No ____________________________________________   INITIAL IMPRESSION / ASSESSMENT AND PLAN / ED COURSE  Pertinent labs & imaging results that were available during my care of the patient were reviewed by me and considered in my medical decision making (see chart for details).   50 year old male admitted to the hospital service for hyponatremia repeat presents to the ED after having left.  The patient states he went to go smoke and denies alcohol use.  He denies any new complaints.  On exam he is alert and oriented and relatively well-appearing.  His vital signs are stable.  The physical exam is otherwise unremarkable.  Neurologic exam is nonfocal.  I consulted Dr. Sidney Ace from the hospitalist service to readmit the patient.     ____________________________________________   FINAL CLINICAL IMPRESSION(S) / ED DIAGNOSES  Final diagnoses:  Hyponatremia      NEW MEDICATIONS STARTED DURING THIS VISIT:  New Prescriptions   No medications on file     Note:  This document was prepared using Dragon voice recognition software and may include unintentional dictation errors.    Arta Silence, MD 07/29/21 [id number]

## 2021-07-29 NOTE — ED Notes (Signed)
Patient taken [employer] pizza and diet pepsi to room 35 brought in by [employer] to front of ER

## 2021-07-29 NOTE — Progress Notes (Signed)
Patient received to room 35. Ambulatory and steady. Denies pain. Requests nicotine patch. Vitals signs wdl. ED physician at bedside. Waiting for hospitalist for admission orders. Patient resting on stretcher with eyes closed. Monitor on and in place.

## 2021-07-29 NOTE — TOC Initial Note (Signed)
Transition of Care [facility]) - Initial/Assessment Note    Patient Details  Name: Tyler Martinez MRN: [id number] Date of Birth: 1971/04/28  [facility]) CM/SW Contact:    Kerin Salen, RN Phone Number: 07/29/2021, 3:30 PM  Clinical Narrative:  Discussed Alcoholism treatment with patient, who says he stopped drinking four days now and have no intentions of drinking again. Patient did agree to treatment resource list, which was given to patient. No other TOC needs assessed.                  Patient Goals and CMS Choice        Expected Discharge Plan and Services                                                Prior Living Arrangements/Services                       Activities of Daily Living      Permission Sought/Granted                  Emotional Assessment              Admission diagnosis:  Hyponatremia [E87.1] Patient Active Problem List   Diagnosis Date Noted   Hypomagnesuria 07/29/2021   Hyponatremia 06/26/2021   Alcohol withdrawal ([place]) 06/26/2021   Polysubstance abuse ([place]) 06/26/2021   Incidental adrenal cortical adenoma 06/26/2021   Abnormal nuclear stress test    Chest pain 05/18/2021   CAD (coronary artery disease) 05/18/2021   Tobacco abuse 05/18/2021   History of substance abuse ([place]) 01/12/2020   Alcohol dependence ([place]) 01/12/2020   Coronary artery disease involving native coronary artery of native heart without angina pectoris 04/14/2019   Essential hypertension 04/14/2019   Hyperlipidemia associated with type 2 diabetes mellitus ([place]) 04/14/2019   History of cerebrovascular accident (CVA) with residual deficit 04/14/2019   Residual cognitive deficit as late effect of stroke 04/14/2019   Type 2 diabetes mellitus with vascular disease ([place]) 04/14/2019   Diabetic polyneuropathy associated with type 2 diabetes mellitus ([place]) 04/14/2019   Rheumatoid arthritis involving multiple sites ([place]) 04/14/2019   Flat  foot 04/14/2019   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 [address] Phone: [phone] Fax: [phone]  [id number] - [place], [place] - [address] [place] [address] Phone: [phone] Fax: [phone]     Social Determinants of Health (SDOH) Interventions    Readmission Risk Interventions No flowsheet data found.

## 2021-07-29 NOTE — Progress Notes (Signed)
Patient asking to have his home medications restarted. Dr. Roosevelt Locks called this RN to go through PTA medication list and advised as to which medications to restart or hold. Kermit Balo, RN also verified that only the medications Dr. Roosevelt Locks wanted were restarted.

## 2021-07-30 DIAGNOSIS — M069 Rheumatoid arthritis, unspecified: Secondary | ICD-10-CM

## 2021-07-30 DIAGNOSIS — R82998 Other abnormal findings in urine: Secondary | ICD-10-CM

## 2021-07-30 DIAGNOSIS — E871 Hypo-osmolality and hyponatremia: Secondary | ICD-10-CM | POA: Diagnosis not present

## 2021-07-30 LAB — CBC
HCT: 35.4 % — ABNORMAL LOW (ref 39.0–52.0)
Hemoglobin: 12.4 g/dL — ABNORMAL LOW (ref 13.0–17.0)
MCH: 35.6 pg — ABNORMAL HIGH (ref 26.0–34.0)
MCHC: 35 g/dL (ref 30.0–36.0)
MCV: 101.7 fL — ABNORMAL HIGH (ref 80.0–100.0)
Platelets: 253 10*3/uL (ref 150–400)
RBC: 3.48 MIL/uL — ABNORMAL LOW (ref 4.22–5.81)
RDW: 12.6 % (ref 11.5–15.5)
WBC: 6.3 10*3/uL (ref 4.0–10.5)
nRBC: 0 % (ref 0.0–0.2)

## 2021-07-30 LAB — BASIC METABOLIC PANEL
Anion gap: 6 (ref 5–15)
BUN: 25 mg/dL — ABNORMAL HIGH (ref 6–20)
CO2: 27 mmol/L (ref 22–32)
Calcium: 9 mg/dL (ref 8.9–10.3)
Chloride: 94 mmol/L — ABNORMAL LOW (ref 98–111)
Creatinine, Ser: 0.82 mg/dL (ref 0.61–1.24)
GFR, Estimated: >60 mL/min (ref >60)
Glucose, Bld: 197 mg/dL — ABNORMAL HIGH (ref 70–99)
Potassium: 4.9 mmol/L (ref 3.5–5.1)
Sodium: 127 mmol/L — ABNORMAL LOW (ref 135–145)

## 2021-07-30 LAB — MAGNESIUM: Magnesium: 1.7 mg/dL (ref 1.7–2.4)

## 2021-07-30 LAB — CORTISOL-AM, BLOOD: Cortisol - AM: 10.3 ug/dL (ref 6.7–22.6)

## 2021-07-30 MED ORDER — SODIUM CHLORIDE 1 G PO TABS
1.0000 g | ORAL_TABLET | Freq: Two times a day (BID) | ORAL | Status: DC
  Administered 2021-07-30: 09:00:00 1 g via ORAL
  Filled 2021-07-30 (×2): qty 1

## 2021-07-30 MED ORDER — SODIUM CHLORIDE 1 G PO TABS: 1.0000 g | ORAL_TABLET | Freq: Two times a day (BID) | ORAL | 0 refills | Status: DC

## 2021-07-30 NOTE — Discharge Summary (Signed)
Physician Discharge Summary  Patient ID: Tyler Martinez MRN: [id number] DOB/AGE: [birth year] 50 y.o.  Admit date: 07/29/2021 Discharge date: 07/30/2021  Admission Diagnoses:  Discharge Diagnoses:  Principal Problem:   Hyponatremia Active Problems:   Type 2 diabetes mellitus with vascular disease ([place])   Rheumatoid arthritis involving multiple sites [facility])   Hypomagnesuria   Discharged Condition: good  Hospital Course:   Tyler Martinez is a 50 y.o. Caucasian male with medical history significant for coronary artery disease, type  2 diabetes mellitus, hypertension and dyslipidemia as well as CVA, who presented to the ER with hyponatremia with a sodium of 118 that was checked by his primary care physician yesterday and was called about it today.  He had nausea vomiting and diarrhea 2 days ago that have resolved.   Hyponatremia secondary to dehydration. Patient had a nausea vomiting and diarrhea for about 4 days.  He has acute hyponatremia from dehydration. He received normal saline infusion, sodium level 127 today.  Sodium level was 129 a month ago. At his this point, patient will be placed on fluid restriction, he will be receiving salt tablets 1 g twice a day. Reviewed previous chart, patient had a CT chest on 06/26/2021.  No evidence of lung cancer. Patient also has a cortisol level pending, please follow-up on results. Will be followed by PCP in 1 week.  Recommend repeat BMP at that time.  Alcohol use disorder. Currently patient does not have any confusion, no agitation.    Essential hypertension. Continue home medicines  Type 2 diabetes Home medicines are resumed.  Coronary disease. Stable, continue home medicines.   Tobacco abuse. Advised to quit.   Consults: None  Significant Diagnostic Studies:   Treatments: IVF  Discharge Exam: Blood pressure (!) 154/110, pulse 82, temperature 98 F (36.7 C), temperature source Oral, resp. rate 15, height 6' (1.829 m), weight  93.9 kg, SpO2 97 %. General appearance: alert and cooperative Resp: clear to auscultation bilaterally Cardio: regular rate and rhythm, S1, S2 normal, no murmur, click, rub or gallop GI: soft, non-tender; bowel sounds normal; no masses,  no organomegaly Extremities: extremities normal, atraumatic, no cyanosis or edema  Disposition: Discharge disposition: 01-Home or Self Care       Discharge Instructions     Diet general   Complete by: As directed    No restriction to salt, low fat, fluid restriction 1533m/day   Increase activity slowly   Complete by: As directed       Allergies as of 07/30/2021       Reactions   Leflunomide Nausea And Vomiting   Methotrexate Derivatives Nausea And Vomiting   Have RA but can not tolerate any  Of the med  No components found with this name: [id number] saw Rheumatology in 2005   Other Nausea And Vomiting   Have RA but can not tolerate any  Of the med  No components found with this name: [id number] saw Rheumatology in 2005        Medication List     STOP taking these medications    folic acid 1 MG tablet Commonly known as: FOLVITE   hydrOXYzine 25 MG capsule Commonly known as: VISTARIL   thiamine 100 MG tablet       TAKE these medications    carvedilol 12.5 MG tablet Commonly known as: COREG Take 1 tablet (12.5 mg total) by mouth 2 (two) times daily with a meal.   clopidogrel 75 MG tablet Commonly known as: PLAVIX Take 1 tablet (75 mg  total) by mouth daily.   ezetimibe 10 MG tablet Commonly known as: ZETIA Take 1 tablet (10 mg total) by mouth daily.   gabapentin 800 MG tablet Commonly known as: NEURONTIN TAKE 1 TABLET BY MOUTH 4 TIMES DAILY.   isosorbide mononitrate 30 MG 24 hr tablet Commonly known as: IMDUR Take 1 tablet (30 mg total) by mouth daily.   lisinopril 40 MG tablet Commonly known as: ZESTRIL Take 1 tablet (40 mg total) by mouth daily.   metFORMIN 1000 MG tablet Commonly known as:  GLUCOPHAGE Take 1 tablet (1,000 mg total) by mouth 2 (two) times daily with a meal.   multivitamin with minerals Tabs tablet Take 1 tablet by mouth daily.   nicotine 21 mg/24hr patch Commonly known as: NICODERM CQ - dosed in mg/24 hours Place 1 patch (21 mg total) onto the skin daily.   onetouch ultrasoft lancets Use to check blood sugar up to twice a day.   OneTouch Verio test strip Generic drug: glucose blood Use to check blood sugar up to twice a day.   OneTouch Verio w/Device Kit Use to check blood sugar up to twice a day.   rosuvastatin 20 MG tablet Commonly known as: CRESTOR Take 1 tablet (20 mg total) by mouth at bedtime.   sodium chloride 1 g tablet Take 1 tablet (1 g total) by mouth 2 (two) times daily with a meal.   traMADol 50 MG tablet Commonly known as: ULTRAM Take 1 tablet (50 mg total) by mouth every 8 (eight) hours as needed.        Follow-up Information     Olin Hauser, DO Follow up in 1 week(s).   Specialty: Family Medicine Contact information: [place] [address] [phone]                35 minutes Signed: Sharen Hones 07/30/2021, 10:25 AM

## 2021-07-31 ENCOUNTER — Telehealth: Payer: Self-pay

## 2021-07-31 NOTE — Telephone Encounter (Signed)
Transition Care Management Follow-up Telephone Call Date of discharge and from where: [facility] OBS 07/30/21 How have you been since you were released from the hospital? BETTER Any questions or concerns? No  Items Reviewed: Did the pt receive and understand the discharge instructions provided? Yes  Medications obtained and verified? Yes  Other? No  Any new allergies since your discharge? No  Dietary orders reviewed? No Do you have support at home? Yes   Home Care and Equipment/Supplies: Were home health services ordered? no   Functional Questionnaire: (I = Independent and D = Dependent) ADLs: I  Bathing/Dressing- I  Meal Prep- I  Eating- I  Maintaining continence- I  Transferring/Ambulation- I  Managing Meds- I  Follow up appointments reviewed:  PCP Hospital f/u appt confirmed? Yes  Scheduled to see Dr.K on 12/12 @ 2:20. Travel arrangements needed? No  If their condition worsens, is the pt aware to call PCP or go to the Emergency Dept.? Yes Was the patient provided with contact information for the PCP's office or ED? Yes Was to pt encouraged to call back with questions or concerns? Yes

## 2021-08-07 ENCOUNTER — Ambulatory Visit (INDEPENDENT_AMBULATORY_CARE_PROVIDER_SITE_OTHER): Payer: Medicare Other | Admitting: Family Medicine

## 2021-08-07 ENCOUNTER — Encounter: Payer: Self-pay | Admitting: Family Medicine

## 2021-08-07 ENCOUNTER — Other Ambulatory Visit: Payer: Self-pay

## 2021-08-07 VITALS — BP 179/126 | HR 88 | Ht 72.0 in | Wt 230.8 lb

## 2021-08-07 DIAGNOSIS — G894 Chronic pain syndrome: Secondary | ICD-10-CM | POA: Diagnosis not present

## 2021-08-07 DIAGNOSIS — M25312 Other instability, left shoulder: Secondary | ICD-10-CM | POA: Diagnosis not present

## 2021-08-07 DIAGNOSIS — M25311 Other instability, right shoulder: Secondary | ICD-10-CM | POA: Diagnosis not present

## 2021-08-07 DIAGNOSIS — E871 Hypo-osmolality and hyponatremia: Secondary | ICD-10-CM

## 2021-08-07 DIAGNOSIS — I2510 Atherosclerotic heart disease of native coronary artery without angina pectoris: Secondary | ICD-10-CM

## 2021-08-07 DIAGNOSIS — M25512 Pain in left shoulder: Secondary | ICD-10-CM | POA: Diagnosis not present

## 2021-08-07 DIAGNOSIS — M79671 Pain in right foot: Secondary | ICD-10-CM | POA: Diagnosis not present

## 2021-08-07 DIAGNOSIS — M25511 Pain in right shoulder: Secondary | ICD-10-CM

## 2021-08-07 DIAGNOSIS — G8929 Other chronic pain: Secondary | ICD-10-CM | POA: Diagnosis not present

## 2021-08-07 DIAGNOSIS — F1020 Alcohol dependence, uncomplicated: Secondary | ICD-10-CM

## 2021-08-07 DIAGNOSIS — I10 Essential (primary) hypertension: Secondary | ICD-10-CM | POA: Diagnosis not present

## 2021-08-07 MED ORDER — FOLIC ACID 1 MG PO TABS: 1.0000 mg | ORAL_TABLET | Freq: Every day | ORAL | 3 refills | Status: DC

## 2021-08-07 MED ORDER — TRAMADOL HCL 50 MG PO TABS: 50.0000 mg | ORAL_TABLET | Freq: Three times a day (TID) | ORAL | 2 refills | Status: DC | PRN

## 2021-08-07 MED ORDER — THIAMINE HCL 100 MG PO TABS: 100.0000 mg | ORAL_TABLET | Freq: Every day | ORAL | 3 refills | Status: DC

## 2021-08-07 MED ORDER — AMLODIPINE BESYLATE 10 MG PO TABS: 10.0000 mg | ORAL_TABLET | Freq: Every day | ORAL | 5 refills | Status: DC

## 2021-08-07 MED ORDER — CARVEDILOL 12.5 MG PO TABS: 12.5000 mg | ORAL_TABLET | Freq: Two times a day (BID) | ORAL | 5 refills | Status: DC

## 2021-08-07 NOTE — Patient Instructions (Addendum)
Thank you for coming to the office today.  Hold off on Sodium pills  Lab today for sodium chemistry  START new Amlodipine 10mg  daily for BP  Increased Carvedilol rx to 12.5mg  twice a day  Keep on Lisinopril  Re ordered Tramadol  Add vitamins Folic Acid / Vitamin B1 Thiamine   Please schedule a Follow-up Appointment to: Return if symptoms worsen or fail to improve.  If you have any other questions or concerns, please feel free to call the office or send a message through [place]. You may also schedule an earlier appointment if necessary.  Additionally, you may be receiving a survey about your experience at our office within a few days to 1 week by e-mail or mail. We value your feedback.  Nobie Putnam, DO [place]

## 2021-08-07 NOTE — Progress Notes (Signed)
Subjective:    Patient ID: Tyler Martinez, male    DOB: May 27, 1971, 50 y.o.   MRN: [id number]  Alistair Senft is a 50 y.o. male presenting on 08/07/2021 for Hospitalization Follow-up and Hypertension   HPI  HOSPITAL FOLLOW-UP VISIT  Hospital/Location: [place] Date of Admission: 07/29/21 Date of Discharge: 07/30/21 Transitions of care telephone call:   Reason for Admission: Hyponatremia, Dehydration  - Hospital H&P and Discharge Summary have been reviewed - Patient presents today 8 days after recent hospitalization. Brief summary of recent course, patient had symptoms of dehydration, hospitalized, treated with IV fluid and med management, ultimately corrected electrolytes, DC'd . - Today reports overall has done well after discharge.  - New medications on discharge: Completed K supplement course   I have reviewed the discharge medication list, and have reconciled the current and discharge medications today.   Current Outpatient Medications:    amLODipine (NORVASC) 10 MG tablet, Take 1 tablet (10 mg total) by mouth daily., Disp: 30 tablet, Rfl: 5   Blood Glucose Monitoring Suppl (ONETOUCH VERIO) w/Device KIT, Use to check blood sugar up to twice a day., Disp: 1 kit, Rfl: 0   clopidogrel (PLAVIX) 75 MG tablet, Take 1 tablet (75 mg total) by mouth daily., Disp: 30 tablet, Rfl: 5   folic acid (FOLVITE) 1 MG tablet, Take 1 tablet (1 mg total) by mouth daily., Disp: 90 tablet, Rfl: 3   gabapentin (NEURONTIN) 800 MG tablet, TAKE 1 TABLET BY MOUTH 4 TIMES DAILY., Disp: 120 tablet, Rfl: 5   isosorbide mononitrate (IMDUR) 30 MG 24 hr tablet, Take 1 tablet (30 mg total) by mouth daily., Disp: 90 tablet, Rfl: 3   Lancets (ONETOUCH ULTRASOFT) lancets, Use to check blood sugar up to twice a day., Disp: 200 each, Rfl: 5   lisinopril (ZESTRIL) 40 MG tablet, Take 1 tablet (40 mg total) by mouth daily., Disp: 90 tablet, Rfl: 0   metFORMIN (GLUCOPHAGE) 1000 MG tablet, Take 1 tablet (1,000 mg total) by  mouth 2 (two) times daily with a meal., Disp: 60 tablet, Rfl: 5   Multiple Vitamin (MULTIVITAMIN WITH MINERALS) TABS tablet, Take 1 tablet by mouth daily., Disp: , Rfl:    nicotine (NICODERM CQ - DOSED IN MG/24 HOURS) 21 mg/24hr patch, Place 1 patch (21 mg total) onto the skin daily., Disp: 28 patch, Rfl: 0   ONETOUCH VERIO test strip, Use to check blood sugar up to twice a day., Disp: 200 each, Rfl: 5   sodium chloride 1 g tablet, Take 1 tablet (1 g total) by mouth 2 (two) times daily with a meal., Disp: 30 tablet, Rfl: 0   thiamine 100 MG tablet, Take 1 tablet (100 mg total) by mouth daily., Disp: 90 tablet, Rfl: 3   carvedilol (COREG) 12.5 MG tablet, Take 1 tablet (12.5 mg total) by mouth 2 (two) times daily with a meal., Disp: 60 tablet, Rfl: 5   ezetimibe (ZETIA) 10 MG tablet, Take 1 tablet (10 mg total) by mouth daily., Disp: 30 tablet, Rfl: 0   rosuvastatin (CRESTOR) 20 MG tablet, Take 1 tablet (20 mg total) by mouth at bedtime., Disp: 30 tablet, Rfl: 0   traMADol (ULTRAM) 50 MG tablet, Take 1 tablet (50 mg total) by mouth every 8 (eight) hours as needed., Disp: 60 tablet, Rfl: 2  ------------------------------------------------------------------------- Social History   Tobacco Use   Smoking status: Every Day    Packs/day: 1.00    Years: 8.00    Pack years: 8.00    Types:  Cigarettes   Smokeless tobacco: Never  Vaping Use   Vaping Use: Never used  Substance Use Topics   Alcohol use: Yes    Alcohol/week: 2.0 standard drinks    Types: 2 Standard drinks or equivalent per week    Comment: Drinks mixed drinks every evening.   Drug use: Not Currently    Review of Systems Per HPI unless specifically indicated above     Objective:    BP (!) 179/126   Pulse 88   Ht 6' (1.829 m)   Wt 230 lb 12.8 oz (104.7 kg)   SpO2 97%   BMI 31.30 kg/m   Wt Readings from Last 3 Encounters:  08/07/21 230 lb 12.8 oz (104.7 kg)  07/29/21 207 lb (93.9 kg)  07/28/21 207 lb (93.9 kg)     Physical Exam Vitals and nursing note reviewed.  Constitutional:      General: He is not in acute distress.    Appearance: He is well-developed. He is not diaphoretic.     Comments: Well-appearing, comfortable, cooperative  HENT:     Head: Normocephalic and atraumatic.  Eyes:     General:        Right eye: No discharge.        Left eye: No discharge.     Conjunctiva/sclera: Conjunctivae normal.  Neck:     Thyroid: No thyromegaly.  Cardiovascular:     Rate and Rhythm: Normal rate and regular rhythm.     Pulses: Normal pulses.     Heart sounds: Normal heart sounds. No murmur heard. Pulmonary:     Effort: Pulmonary effort is normal. No respiratory distress.     Breath sounds: Normal breath sounds. No wheezing or rales.  Musculoskeletal:        General: Normal range of motion.     Cervical back: Normal range of motion and neck supple.  Lymphadenopathy:     Cervical: No cervical adenopathy.  Skin:    General: Skin is warm and dry.     Findings: No erythema or rash.  Neurological:     Mental Status: He is alert and oriented to person, place, and time. Mental status is at baseline.  Psychiatric:        Behavior: Behavior normal.     Comments: Well groomed, good eye contact, normal speech and thoughts   Results for orders placed or performed during the hospital encounter of 47/09/62  Basic metabolic panel  Result Value Ref Range   Sodium 125 (L) 135 - 145 mmol/L   Potassium 5.0 3.5 - 5.1 mmol/L   Chloride 92 (L) 98 - 111 mmol/L   CO2 27 22 - 32 mmol/L   Glucose, Bld 171 (H) 70 - 99 mg/dL   BUN 21 (H) 6 - 20 mg/dL   Creatinine, Ser 0.70 0.61 - 1.24 mg/dL   Calcium 8.6 (L) 8.9 - 10.3 mg/dL   GFR, Estimated >60 >60 mL/min   Anion gap 6 5 - 15  Magnesium  Result Value Ref Range   Magnesium 1.5 (L) 1.7 - 2.4 mg/dL  CBC  Result Value Ref Range   WBC 6.3 4.0 - 10.5 K/uL   RBC 3.53 (L) 4.22 - 5.81 MIL/uL   Hemoglobin 12.5 (L) 13.0 - 17.0 g/dL   HCT 35.5 (L) 39.0 - 52.0 %    MCV 100.6 (H) 80.0 - 100.0 fL   MCH 35.4 (H) 26.0 - 34.0 pg   MCHC 35.2 30.0 - 36.0 g/dL   RDW 12.8 11.5 -  15.5 %   Platelets 235 150 - 400 K/uL   nRBC 0.0 0.0 - 0.2 %  Basic metabolic panel  Result Value Ref Range   Sodium 127 (L) 135 - 145 mmol/L   Potassium 4.9 3.5 - 5.1 mmol/L   Chloride 94 (L) 98 - 111 mmol/L   CO2 27 22 - 32 mmol/L   Glucose, Bld 197 (H) 70 - 99 mg/dL   BUN 25 (H) 6 - 20 mg/dL   Creatinine, Ser 0.82 0.61 - 1.24 mg/dL   Calcium 9.0 8.9 - 10.3 mg/dL   GFR, Estimated >60 >60 mL/min   Anion gap 6 5 - 15  CBC  Result Value Ref Range   WBC 6.3 4.0 - 10.5 K/uL   RBC 3.48 (L) 4.22 - 5.81 MIL/uL   Hemoglobin 12.4 (L) 13.0 - 17.0 g/dL   HCT 35.4 (L) 39.0 - 52.0 %   MCV 101.7 (H) 80.0 - 100.0 fL   MCH 35.6 (H) 26.0 - 34.0 pg   MCHC 35.0 30.0 - 36.0 g/dL   RDW 12.6 11.5 - 15.5 %   Platelets 253 150 - 400 K/uL   nRBC 0.0 0.0 - 0.2 %  Magnesium  Result Value Ref Range   Magnesium 1.7 1.7 - 2.4 mg/dL  Cortisol-am, blood  Result Value Ref Range   Cortisol - AM 10.3 6.7 - 22.6 ug/dL      Assessment & Plan:   Problem List Items Addressed This Visit     Hyponatremia - Primary   Relevant Orders   BASIC METABOLIC PANEL WITH GFR   Essential hypertension   Relevant Medications   carvedilol (COREG) 12.5 MG tablet   amLODipine (NORVASC) 10 MG tablet   Other Relevant Orders   BASIC METABOLIC PANEL WITH GFR   Coronary artery disease involving native coronary artery of native heart without angina pectoris   Relevant Medications   carvedilol (COREG) 12.5 MG tablet   amLODipine (NORVASC) 10 MG tablet   Alcohol dependence (HCC)   Relevant Medications   thiamine 939 MG tablet   folic acid (FOLVITE) 1 MG tablet   Other Visit Diagnoses     Chronic heel pain, right       Relevant Medications   traMADol (ULTRAM) 50 MG tablet   Other Relevant Orders   Ambulatory referral to Podiatry   Chronic right shoulder pain       Relevant Medications   traMADol (ULTRAM) 50  MG tablet   Rotator cuff insufficiency of right shoulder       Relevant Medications   traMADol (ULTRAM) 50 MG tablet   Chronic pain syndrome       Relevant Medications   traMADol (ULTRAM) 50 MG tablet   Chronic left shoulder pain       Relevant Medications   traMADol (ULTRAM) 50 MG tablet   Rotator cuff insufficiency of left shoulder       Relevant Medications   traMADol (ULTRAM) 50 MG tablet       HFU Acute on chronic hyponatremia, dehydration Improved, on discharge 12/4 Na up to 127, he has chronic hyponatremia w/ chronic alcohol use. Discussed today unlikely to get optimal Na range with alcohol  Hold off on Sodium pills  Lab today for sodium chemistry   Elevated HTN Uncontrolled, concern with substance use and alcohol  START new Amlodipine 39m daily for BP  Increased Carvedilol rx to 12.584mtwice a day  Keep on Lisinopril  Re ordered Tramadol  Add vitamins Folic Acid /  Vitamin B1 Thiamine  Meds ordered this encounter  Medications   thiamine 100 MG tablet    Sig: Take 1 tablet (100 mg total) by mouth daily.    Dispense:  90 tablet    Refill:  3   folic acid (FOLVITE) 1 MG tablet    Sig: Take 1 tablet (1 mg total) by mouth daily.    Dispense:  90 tablet    Refill:  3   carvedilol (COREG) 12.5 MG tablet    Sig: Take 1 tablet (12.5 mg total) by mouth 2 (two) times daily with a meal.    Dispense:  60 tablet    Refill:  5   amLODipine (NORVASC) 10 MG tablet    Sig: Take 1 tablet (10 mg total) by mouth daily.    Dispense:  30 tablet    Refill:  5   traMADol (ULTRAM) 50 MG tablet    Sig: Take 1 tablet (50 mg total) by mouth every 8 (eight) hours as needed.    Dispense:  60 tablet    Refill:  2    Follow up plan: Return if symptoms worsen or fail to improve.  Nobie Putnam, [facility] 08/07/2021, 2:47 PM

## 2021-08-08 LAB — BASIC METABOLIC PANEL WITH GFR
BUN: 17 mg/dL (ref 7–25)
CO2: 28 mmol/L (ref 20–32)
Calcium: 9.1 mg/dL (ref 8.6–10.3)
Chloride: 105 mmol/L (ref 98–110)
Creat: 0.84 mg/dL (ref 0.70–1.30)
Glucose, Bld: 146 mg/dL — ABNORMAL HIGH (ref 65–139)
Potassium: 4.7 mmol/L (ref 3.5–5.3)
Sodium: 139 mmol/L (ref 135–146)
eGFR: 106 mL/min/{1.73_m2} (ref >=60)

## 2021-08-10 ENCOUNTER — Ambulatory Visit: Payer: Medicare Other | Admitting: Podiatry

## 2021-08-10 ENCOUNTER — Ambulatory Visit (INDEPENDENT_AMBULATORY_CARE_PROVIDER_SITE_OTHER): Payer: Medicare Other

## 2021-08-10 ENCOUNTER — Ambulatory Visit: Payer: Medicare Other

## 2021-08-10 ENCOUNTER — Other Ambulatory Visit: Payer: Self-pay

## 2021-08-10 ENCOUNTER — Encounter: Payer: Self-pay | Admitting: Podiatry

## 2021-08-10 DIAGNOSIS — M722 Plantar fascial fibromatosis: Secondary | ICD-10-CM

## 2021-08-10 NOTE — Progress Notes (Signed)
Subjective:  Patient ID: Tyler Martinez, male    DOB: [birth year],  MRN: [id number]  No chief complaint on file.   50 y.o. male presents with the above complaint.  Patient presents with complaint of right heel pain that has been going for quite some time is progressive gotten worse.  He has not seen anyone as prior to seeing me.  He states that he has had history of Planter fasciitis before and this is the worst he is ever felt.  His pain scale is 10 out of 10 sharp shooting in nature.  He would like to discuss treatment options with me.  He denies any other treatment options.  That he has tried   Review of Systems: Negative except as noted in the HPI. Denies N/V/F/Ch.  Past Medical History:  Diagnosis Date   Anxiety    Arthritis    Cardiac arrest ([place]) 2008   Coronary artery disease    Diabetes mellitus without complication ([place])    Heart attack ([place])    Hyperlipidemia    Hypertension    MI (myocardial infarction) ([place])    Stroke ([place])     Current Outpatient Medications:    amLODipine (NORVASC) 10 MG tablet, Take 1 tablet (10 mg total) by mouth daily., Disp: 30 tablet, Rfl: 5   Blood Glucose Monitoring Suppl (ONETOUCH VERIO) w/Device KIT, Use to check blood sugar up to twice a day., Disp: 1 kit, Rfl: 0   carvedilol (COREG) 12.5 MG tablet, Take 1 tablet (12.5 mg total) by mouth 2 (two) times daily with a meal., Disp: 60 tablet, Rfl: 5   clopidogrel (PLAVIX) 75 MG tablet, Take 1 tablet (75 mg total) by mouth daily., Disp: 30 tablet, Rfl: 5   ezetimibe (ZETIA) 10 MG tablet, Take 1 tablet (10 mg total) by mouth daily., Disp: 30 tablet, Rfl: 0   folic acid (FOLVITE) 1 MG tablet, Take 1 tablet (1 mg total) by mouth daily., Disp: 90 tablet, Rfl: 3   gabapentin (NEURONTIN) 800 MG tablet, TAKE 1 TABLET BY MOUTH 4 TIMES DAILY., Disp: 120 tablet, Rfl: 5   isosorbide mononitrate (IMDUR) 30 MG 24 hr tablet, Take 1 tablet (30 mg total) by mouth daily., Disp: 90 tablet, Rfl: 3   Lancets (ONETOUCH  ULTRASOFT) lancets, Use to check blood sugar up to twice a day., Disp: 200 each, Rfl: 5   lisinopril (ZESTRIL) 40 MG tablet, Take 1 tablet (40 mg total) by mouth daily., Disp: 90 tablet, Rfl: 0   metFORMIN (GLUCOPHAGE) 1000 MG tablet, Take 1 tablet (1,000 mg total) by mouth 2 (two) times daily with a meal., Disp: 60 tablet, Rfl: 5   Multiple Vitamin (MULTIVITAMIN WITH MINERALS) TABS tablet, Take 1 tablet by mouth daily., Disp: , Rfl:    nicotine (NICODERM CQ - DOSED IN MG/24 HOURS) 21 mg/24hr patch, Place 1 patch (21 mg total) onto the skin daily., Disp: 28 patch, Rfl: 0   ONETOUCH VERIO test strip, Use to check blood sugar up to twice a day., Disp: 200 each, Rfl: 5   rosuvastatin (CRESTOR) 20 MG tablet, Take 1 tablet (20 mg total) by mouth at bedtime., Disp: 30 tablet, Rfl: 0   sodium chloride 1 g tablet, Take 1 tablet (1 g total) by mouth 2 (two) times daily with a meal., Disp: 30 tablet, Rfl: 0   thiamine 100 MG tablet, Take 1 tablet (100 mg total) by mouth daily., Disp: 90 tablet, Rfl: 3   traMADol (ULTRAM) 50 MG tablet, Take 1 tablet (50  mg total) by mouth every 8 (eight) hours as needed., Disp: 60 tablet, Rfl: 2  Social History   Tobacco Use  Smoking Status Every Day   Packs/day: 1.00   Years: 8.00   Pack years: 8.00   Types: Cigarettes  Smokeless Tobacco Never    Allergies  Allergen Reactions   Leflunomide Nausea And Vomiting   Methotrexate Derivatives Nausea And Vomiting    Have RA but can not tolerate any  Of the med  No components found with this name: PROTEIN24HR saw Rheumatology in 2005   Other Nausea And Vomiting    Have RA but can not tolerate any  Of the med  No components found with this name: PROTEIN24HR saw Rheumatology in 2005   Objective:  There were no vitals filed for this visit. There is no height or weight on file to calculate BMI. Constitutional Well developed. Well nourished.  Vascular Dorsalis pedis pulses palpable bilaterally. Posterior tibial pulses  palpable bilaterally. Capillary refill normal to all digits.  No cyanosis or clubbing noted. Pedal hair growth normal.  Neurologic Normal speech. Oriented to person, place, and time. Epicritic sensation to light touch grossly present bilaterally.  Dermatologic Nails well groomed and normal in appearance. No open wounds. No skin lesions.  Orthopedic: Normal joint ROM without pain or crepitus bilaterally. No visible deformities. Tender to palpation at the calcaneal tuber right. No pain with calcaneal squeeze right. Ankle ROM diminished range of motion right. Silf[place]old Test: positive right.   Radiographs: Taken and reviewed. No acute fractures or dislocations. No evidence of stress fracture.  Plantar heel spur present. Posterior heel spur present.   Assessment:   1. Plantar fasciitis of right foot    Plan:  Patient was evaluated and treated and all questions answered.  Plantar Fasciitis, right - XR reviewed as above.  - Educated on icing and stretching. Instructions given.  - Injection delivered to the plantar fascia as below. - DME: Plantar fascial brace dispensed to support the medial longitudinal arch of the foot and offload pressure from the heel and prevent arch collapse during weightbearing - Pharmacologic management: None  Procedure: Injection Tendon/Ligament Location: Right plantar fascia at the glabrous junction; medial approach. Skin Prep: alcohol Injectate: 0.5 cc 0.5% marcaine plain, 0.5 cc of 1% Lidocaine, 0.5 cc kenalog 10. Disposition: Patient tolerated procedure well. Injection site dressed with a band-aid.  No follow-ups on file.

## 2021-08-14 ENCOUNTER — Other Ambulatory Visit: Payer: Self-pay | Admitting: Family Medicine

## 2021-08-14 DIAGNOSIS — E1159 Type 2 diabetes mellitus with other circulatory complications: Secondary | ICD-10-CM

## 2021-08-14 NOTE — Telephone Encounter (Signed)
Requested Prescriptions  Pending Prescriptions Disp Refills   metFORMIN (GLUCOPHAGE) 1000 MG tablet [Pharmacy Med Name: METFORMIN HCL 1,000 MG TABLET] 180 tablet 1    Sig: TAKE 1 TABLET BY MOUTH TWICE A DAY WITH A MEAL     Endocrinology:  Diabetes - Biguanides Passed - 08/14/2021  1:35 AM      Passed - Cr in normal range and within 360 days    Creat  Date Value Ref Range Status  08/07/2021 0.84 0.70 - 1.30 mg/dL Final         Passed - HBA1C is between 0 and 7.9 and within 180 days    Hgb A1c MFr Bld  Date Value Ref Range Status  05/18/2021 6.8 (H) 4.8 - 5.6 % Final    Comment:    (NOTE)         Prediabetes: 5.7 - 6.4         Diabetes: >6.4         Glycemic control for adults with diabetes: <7.0          Passed - eGFR in normal range and within 360 days    GFR, Est African American  Date Value Ref Range Status  06/03/2019 118 > OR = 60 mL/min/1.15m Final   GFR, Est Non African American  Date Value Ref Range Status  06/03/2019 102 > OR = 60 mL/min/1.776mFinal   GFR, Estimated  Date Value Ref Range Status  07/30/2021 >60 >60 mL/min Final    Comment:    (NOTE) Calculated using the CKD-EPI Creatinine Equation (2021)    eGFR  Date Value Ref Range Status  08/07/2021 106 > OR = 60 mL/min/1.7371minal    Comment:    The eGFR is based on the CKD-EPI 2021 equation. To calculate  the new eGFR from a previous Creatinine or Cystatin C result, go to https://www.kidney.org/professionals/ kdoqi/gfr%5Fcalculator          Passed - Valid encounter within last 6 months    Recent Outpatient Visits          1 week ago [facility]onatremia   [facility]   2 weeks [facility], [facility]   2 months ago Plantar fasciitis of right foot   [facility]   4 months ago Acute bilateral low back pain with left-sided sciatica   [facility]   4 months ago Essential hypertension   [facility]

## 2021-09-07 ENCOUNTER — Ambulatory Visit: Payer: Medicare Other | Admitting: Podiatry

## 2021-09-21 ENCOUNTER — Other Ambulatory Visit: Payer: Self-pay | Admitting: Family Medicine

## 2021-09-21 DIAGNOSIS — I2510 Atherosclerotic heart disease of native coronary artery without angina pectoris: Secondary | ICD-10-CM

## 2021-09-21 NOTE — Telephone Encounter (Signed)
Requested Prescriptions  Pending Prescriptions Disp Refills   clopidogrel (PLAVIX) 75 MG tablet [Pharmacy Med Name: CLOPIDOGREL 75 MG TABLET] 90 tablet 1    Sig: TAKE 1 TABLET BY MOUTH EVERY DAY     Hematology: Antiplatelets - clopidogrel Failed - 09/21/2021  1:30 AM      Failed - Evaluate AST, ALT within 2 months of therapy initiation.      Failed - HCT in normal range and within 180 days    HCT  Date Value Ref Range Status  07/30/2021 35.4 (L) 39.0 - 52.0 % Final         Failed - HGB in normal range and within 180 days    Hemoglobin  Date Value Ref Range Status  07/30/2021 12.4 (L) 13.0 - 17.0 g/dL Final         Passed - ALT in normal range and within 360 days    ALT  Date Value Ref Range Status  07/28/2021 16 0 - 44 U/L Final         Passed - AST in normal range and within 360 days    AST  Date Value Ref Range Status  07/28/2021 17 15 - 41 U/L Final         Passed - PLT in normal range and within 180 days    Platelets  Date Value Ref Range Status  07/30/2021 253 150 - 400 K/uL Final         Passed - Valid encounter within last 6 months    Recent Outpatient Visits          1 month ago Hyponatremia   [place], DO   1 month ago Diarrhea, unspecified type   [facility] [place], Coralie Keens, NP   3 months ago Plantar fasciitis of right foot   [place], DO   5 months ago Acute bilateral low back pain with left-sided sciatica   [place], DO   6 months ago Essential hypertension   [place], Devonne Doughty, [place]

## 2021-09-22 ENCOUNTER — Other Ambulatory Visit: Payer: Self-pay | Admitting: Internal Medicine

## 2021-09-22 NOTE — Telephone Encounter (Signed)
Requested Prescriptions  Pending Prescriptions Disp Refills   lisinopril (ZESTRIL) 40 MG tablet [Pharmacy Med Name: LISINOPRIL 40 MG TABLET] 90 tablet 0    Sig: TAKE 1 TABLET BY MOUTH EVERY DAY     Cardiovascular:  ACE Inhibitors Failed - 09/22/2021  1:10 PM      Failed - Last BP in normal range    BP Readings from Last 1 Encounters:  08/07/21 (!) 179/126         Passed - Cr in normal range and within 180 days    Creat  Date Value Ref Range Status  08/07/2021 0.84 0.70 - 1.30 mg/dL Final         Passed - K in normal range and within 180 days    Potassium  Date Value Ref Range Status  08/07/2021 4.7 3.5 - 5.3 mmol/L Final         Passed - Patient is not pregnant      Passed - Valid encounter within last 6 months    Recent Outpatient Visits          1 month ago Hyponatremia   [place], DO   1 month ago Diarrhea, unspecified type   [facility] [place], Coralie Keens, NP   3 months ago Plantar fasciitis of right foot   [place], DO   5 months ago Acute bilateral low back pain with left-sided sciatica   [place], DO   6 months ago Essential hypertension   [place], Devonne Doughty, [place]

## 2021-10-07 ENCOUNTER — Other Ambulatory Visit: Payer: Self-pay | Admitting: Family Medicine

## 2021-10-07 DIAGNOSIS — I10 Essential (primary) hypertension: Secondary | ICD-10-CM

## 2021-10-07 DIAGNOSIS — I2510 Atherosclerotic heart disease of native coronary artery without angina pectoris: Secondary | ICD-10-CM

## 2021-10-07 NOTE — Telephone Encounter (Signed)
Requested medications are due for refill today.  no  Requested medications are on the active medications list.  no  Last refill. 05/02/2021  Future visit scheduled.   no  Notes to clinic.  Dosage changed. 6.25 mg was d/c'd 05/20/2021.    Requested Prescriptions  Pending Prescriptions Disp Refills   carvedilol (COREG) 6.25 MG tablet [Pharmacy Med Name: CARVEDILOL 6.25 MG TABLET] 180 tablet 0    Sig: TAKE 1 TABLET BY MOUTH 2 TIMES DAILY WITH A MEAL.     Cardiovascular: Beta Blockers 3 Failed - 10/07/2021 12:52 AM      Failed - Last BP in normal range    BP Readings from Last 1 Encounters:  08/07/21 (!) 179/126          Passed - Cr in normal range and within 360 days    Creat  Date Value Ref Range Status  08/07/2021 0.84 0.70 - 1.30 mg/dL Final          Passed - AST in normal range and within 360 days    AST  Date Value Ref Range Status  07/28/2021 17 15 - 41 U/L Final          Passed - ALT in normal range and within 360 days    ALT  Date Value Ref Range Status  07/28/2021 16 0 - 44 U/L Final          Passed - Last Heart Rate in normal range    Pulse Readings from Last 1 Encounters:  08/07/21 88          Passed - Valid encounter within last 6 months    Recent Outpatient Visits           2 months ago Hyponatremia   [place], DO   2 months ago Diarrhea, unspecified type   [place], Coralie Keens, NP   4 months ago Plantar fasciitis of right foot   [place], DO   6 months ago Acute bilateral low back pain with left-sided sciatica   [place], DO   6 months ago Essential hypertension   [place], Devonne Doughty, [place]

## 2021-10-11 ENCOUNTER — Ambulatory Visit: Payer: Self-pay

## 2021-10-11 NOTE — Telephone Encounter (Signed)
° °  Chief Complaint: Abdominal pain Symptoms: Pain, bloating.Mild constipation Frequency: Started 3 days ago Pertinent Negatives: Patient denies blood in stool, fever Disposition: [] ED /[] Urgent Care (no appt availability in office) / [x] Appointment(In office/virtual)/ []  [facility]/ [] Home Care/ [] Refused Recommended Disposition /[] [facility]/ []  Follow-up with PCP Additional Notes: Instructed to go to UC for worsening of symptoms.   Reason for Disposition  [1] MODERATE pain (e.g., interferes with normal activities) AND [2] pain comes and goes (cramps) AND [3] present > 24 hours  (Exception: pain with Vomiting or Diarrhea - see that Guideline)  Answer Assessment - Initial Assessment Questions 1. LOCATION: "Where does it hurt?"      Left side 2. RADIATION: "Does the pain shoot anywhere else?" (e.g., chest, back)     No 3. ONSET: "When did the pain begin?" (Minutes, hours or days ago)      3 days 4. SUDDEN: "Gradual or sudden onset?"     Sudden 5. PATTERN "Does the pain come and go, or is it constant?"    - If constant: "Is it getting better, staying the same, or worsening?"      (Note: Constant means the pain never goes away completely; most serious pain is constant and it progresses)     - If intermittent: "How long does it last?" "Do you have pain now?"     (Note: Intermittent means the pain goes away completely between bouts)     Comes and goes 6. SEVERITY: "How bad is the pain?"  (e.g., Scale 1-10; mild, moderate, or severe)    - MILD (1-3): doesn't interfere with normal activities, abdomen soft and not tender to touch     - MODERATE (4-7): interferes with normal activities or awakens from sleep, abdomen tender to touch     - SEVERE (8-10): excruciating pain, doubled over, unable to do any normal activities       Now - 5 7. RECURRENT SYMPTOM: "Have you ever had this type of stomach pain before?" If Yes, ask: "When was the last time?" and "What happened that  time?"      Yes 8. CAUSE: "What do you think is causing the stomach pain?"     Unsure 9. RELIEVING/AGGRAVATING FACTORS: "What makes it better or worse?" (e.g., movement, antacids, bowel movement)     Eating helps 10. OTHER SYMPTOMS: "Do you have any other symptoms?" (e.g., back pain, diarrhea, fever, urination pain, vomiting)       Mild diarrhea  Protocols used: Abdominal Pain - Male-A-AH

## 2021-10-16 ENCOUNTER — Ambulatory Visit
Admission: RE | Admit: 2021-10-16 | Discharge: 2021-10-16 | Disposition: A | Discharge status: Home / Self Care | Payer: Medicare Other | Attending: Family Medicine | Admitting: Family Medicine

## 2021-10-16 ENCOUNTER — Ambulatory Visit
Admission: RE | Admit: 2021-10-16 | Discharge: 2021-10-16 | Disposition: A | Discharge status: Home / Self Care | Payer: Medicare Other | Source: Ambulatory Visit | Attending: Family Medicine | Admitting: Family Medicine

## 2021-10-16 ENCOUNTER — Other Ambulatory Visit: Payer: Self-pay

## 2021-10-16 ENCOUNTER — Encounter: Payer: Self-pay | Admitting: Family Medicine

## 2021-10-16 ENCOUNTER — Ambulatory Visit (INDEPENDENT_AMBULATORY_CARE_PROVIDER_SITE_OTHER): Payer: Medicare Other | Admitting: Family Medicine

## 2021-10-16 VITALS — BP 130/90 | HR 89 | Ht 72.0 in | Wt 217.8 lb

## 2021-10-16 DIAGNOSIS — R109 Unspecified abdominal pain: Secondary | ICD-10-CM | POA: Insufficient documentation

## 2021-10-16 DIAGNOSIS — E871 Hypo-osmolality and hyponatremia: Secondary | ICD-10-CM | POA: Diagnosis not present

## 2021-10-16 DIAGNOSIS — K5903 Drug induced constipation: Secondary | ICD-10-CM

## 2021-10-16 DIAGNOSIS — R140 Abdominal distension (gaseous): Secondary | ICD-10-CM | POA: Diagnosis not present

## 2021-10-16 DIAGNOSIS — K5900 Constipation, unspecified: Secondary | ICD-10-CM | POA: Diagnosis not present

## 2021-10-16 MED ORDER — SODIUM CHLORIDE 1 G PO TABS: 1.0000 g | ORAL_TABLET | Freq: Two times a day (BID) | ORAL | 2 refills | Status: DC

## 2021-10-16 NOTE — Patient Instructions (Addendum)
Thank you for coming to the office today.  X-ray today for abdomen.  Likely related to the Tramadol, goal to keep a bowel regimen to keep bowels moving well.  For Constipation (less frequent bowel movement that can be hard dry or involve straining).  Recommend trying OTC Miralax 17g = 1 capful in large glass water once daily for now, try several days to see if working, goal is soft stool or BM 1-2 times daily, if too loose then reduce dose or try every other day. If not effective may need to increase it to 2 doses at once in AM or may do 1 in morning and 1 in afternoon/evening  - This medicine is very safe and can be used often without any problem and will not make you dehydrated. It is good for use on AS NEEDED BASIS or even MAINTENANCE therapy for longer term for several days to weeks at a time to help regulate bowel movements  Other more natural remedies or preventative treatment: - Increase hydration with water - Increase fiber in diet (high fiber foods = vegetables, leafy greens, oats/grains) - May take OTC Fiber supplement (metamucil powder or pill/gummy) - May try OTC Probiotic  RESTART Sodium tablet 1g twice a day for now,  Repeat lab in 4-6 weeks  Please schedule a Follow-up Appointment to: Return if symptoms worsen or fail to improve.  If you have any other questions or concerns, please feel free to call the office or send a message through [place]. You may also schedule an earlier appointment if necessary.  Additionally, you may be receiving a survey about your experience at our office within a few days to 1 week by e-mail or mail. We value your feedback.  Tyler Putnam, DO [place]

## 2021-10-16 NOTE — Progress Notes (Signed)
Subjective:    Patient ID: Tyler Martinez, male    DOB: [birth year], 51 y.o.   MRN: [id number]  Tyler Martinez is a 51 y.o. male presenting on 10/16/2021 for Abdominal Pain and Bloated   HPI  Abdominal Bloating Opioid Induced Constipation Reports persistent issue with abdominal bloating distention gas and constipation, sometimes has loose liquid bowel but mostly has slower stool bowel movements. He has to strain at times. He is on Tramadol PRN for pain. He does not take stool softener  Hyponatremia He was doing well on sodium tablet 1g BID previously then off tabs. He still is drinking alcohol regularly Last lab was normal.   Depression screen [facility] 2/9 10/16/2021 06/05/2021 04/04/2021  Decreased Interest 1 3 1   Down, Depressed, Hopeless 1 1 1   PHQ - 2 Score 2 4 2   Altered sleeping 1 0 1  Tired, decreased energy 1 2 1   Change in appetite 0 0 1  Feeling bad or failure about yourself  0 0 0  Trouble concentrating 1 1 1   Moving slowly or fidgety/restless 0 2 1  Suicidal thoughts 0 0 0  PHQ-9 Score 5 9 7   Difficult doing work/chores Not difficult at all Extremely dIfficult Somewhat difficult    Social History   Tobacco Use   Smoking status: Every Day    Packs/day: 1.00    Years: 8.00    Pack years: 8.00    Types: Cigarettes   Smokeless tobacco: Never  Vaping Use   Vaping Use: Never used  Substance Use Topics   Alcohol use: Yes    Alcohol/week: 2.0 standard drinks    Types: 2 Standard drinks or equivalent per week    Comment: Drinks mixed drinks every evening.   Drug use: Not Currently    Review of Systems Per HPI unless specifically indicated above     Objective:    BP 130/90    Pulse 89    Ht 6' (1.829 m)    Wt 217 lb 12.8 oz (98.8 kg)    SpO2 97%    BMI 29.54 kg/m   Wt Readings from Last 3 Encounters:  10/16/21 217 lb 12.8 oz (98.8 kg)  08/07/21 230 lb 12.8 oz (104.7 kg)  07/29/21 207 lb (93.9 kg)    Physical Exam Vitals and nursing note reviewed.   Constitutional:      General: He is not in acute distress.    Appearance: Normal appearance. He is well-developed. He is not diaphoretic.     Comments: Well-appearing, comfortable, cooperative  HENT:     Head: Normocephalic and atraumatic.  Eyes:     General:        Right eye: No discharge.        Left eye: No discharge.     Conjunctiva/sclera: Conjunctivae normal.  Cardiovascular:     Rate and Rhythm: Normal rate.  Pulmonary:     Effort: Pulmonary effort is normal.  Abdominal:     General: Bowel sounds are decreased. There is distension.     Palpations: Abdomen is soft.     Tenderness: There is no abdominal tenderness.  Skin:    General: Skin is warm and dry.     Findings: No erythema or rash.  Neurological:     Mental Status: He is alert and oriented to person, place, and time.  Psychiatric:        Mood and Affect: Mood normal.        Behavior: Behavior normal.  Thought Content: Thought content normal.     Comments: Well groomed, good eye contact, normal speech and thoughts     Results for orders placed or performed in visit on 41/66/06  BASIC METABOLIC PANEL WITH GFR  Result Value Ref Range   Glucose, Bld 146 (H) 65 - 139 mg/dL   BUN 17 7 - 25 mg/dL   Creat 0.84 0.70 - 1.30 mg/dL   eGFR 106 > OR = 60 mL/min/1.28m   BUN/Creatinine Ratio NOT APPLICABLE 6 - 22 (calc)   Sodium 139 135 - 146 mmol/L   Potassium 4.7 3.5 - 5.3 mmol/L   Chloride 105 98 - 110 mmol/L   CO2 28 20 - 32 mmol/L   Calcium 9.1 8.6 - 10.3 mg/dL   I have personally reviewed the radiology report from 10/16/21 X-ray KUB.  CLINICAL DATA:  51year old male with constipation, gas and bloating.   EXAM: ABDOMEN - 1 VIEW   COMPARISON:  None.   FINDINGS: AP supine views at 1022 hours. Non obstructed bowel gas pattern. Mild volume of retained stool, primarily in the right colon. Other abdominal and pelvic visceral contours appear within normal limits. Incidental pelvic phleboliths. No acute  osseous abnormality identified.   IMPRESSION: Nonobstructed bowel-gas pattern with mild volume of retained stool.     Electronically Signed   By: HGenevie AnnM.D.   On: 10/16/2021 10:23     Assessment & Plan:   Problem List Items Addressed This Visit     Hyponatremia   Relevant Medications   sodium chloride 1 g tablet   Other Relevant Orders   BASIC METABOLIC PANEL WITH GFR   Other Visit Diagnoses     Abdominal bloating with cramps    -  Primary   Relevant Orders   DG Abd 1 View (Completed)   Drug-induced constipation       Relevant Orders   DG Abd 1 View (Completed)       Clinically with constipation abdomen bloating and distention, likely secondary to opioid induced constipation OIC w tramadol Has some watery loose stool that seems to be result of constipation as well.  Trial on Miralax per AVS  Discussion on limiting alcohol in future, can worsening digestion symptoms and hyponatremia  Will re order sodium tablets, repeat lab 4-6 weeks BMET   Meds ordered this encounter  Medications   sodium chloride 1 g tablet    Sig: Take 1 tablet (1 g total) by mouth 2 (two) times daily with a meal.    Dispense:  30 tablet    Refill:  2      Follow up plan: Return if symptoms worsen or fail to improve.  Future BMET 4-6 weeks  ANobie Putnam DO [facility] 10/16/2021, 9:52 AM

## 2021-10-18 ENCOUNTER — Other Ambulatory Visit: Payer: Self-pay | Admitting: Family Medicine

## 2021-10-18 DIAGNOSIS — I2510 Atherosclerotic heart disease of native coronary artery without angina pectoris: Secondary | ICD-10-CM

## 2021-10-18 DIAGNOSIS — I10 Essential (primary) hypertension: Secondary | ICD-10-CM

## 2021-10-18 NOTE — Telephone Encounter (Signed)
Requesting Zestril early, Coreg dose has changed. Requested Prescriptions  Pending Prescriptions Disp Refills   carvedilol (COREG) 6.25 MG tablet [Pharmacy Med Name: CARVEDILOL 6.25 MG TABLET] 180 tablet 0    Sig: TAKE 1 TABLET BY MOUTH 2 TIMES DAILY WITH A MEAL.     Cardiovascular: Beta Blockers 3 Failed - 10/18/2021 11:04 AM      Failed - Last BP in normal range    BP Readings from Last 1 Encounters:  10/16/21 130/90         Passed - Cr in normal range and within 360 days    Creat  Date Value Ref Range Status  08/07/2021 0.84 0.70 - 1.30 mg/dL Final         Passed - AST in normal range and within 360 days    AST  Date Value Ref Range Status  07/28/2021 17 15 - 41 U/L Final         Passed - ALT in normal range and within 360 days    ALT  Date Value Ref Range Status  07/28/2021 16 0 - 44 U/L Final         Passed - Last Heart Rate in normal range    Pulse Readings from Last 1 Encounters:  10/16/21 89         Passed - Valid encounter within last 6 months    Recent Outpatient Visits          2 days ago Abdominal bloating with cramps   [place], DO   2 months ago Hyponatremia   [place], DO   2 months ago Diarrhea, unspecified type   [facility] [place], Coralie Keens, NP   4 months ago Plantar fasciitis of right foot   [place], DO   6 months ago Acute bilateral low back pain with left-sided sciatica   [place], DO              lisinopril (ZESTRIL) 40 MG tablet [Pharmacy Med Name: LISINOPRIL 40 MG TABLET] 90 tablet 0    Sig: TAKE 1 TABLET BY MOUTH EVERY DAY     Cardiovascular:  ACE Inhibitors Failed - 10/18/2021 11:04 AM      Failed - Last BP in normal range    BP Readings from Last 1 Encounters:  10/16/21 130/90         Passed - Cr in normal range and within 180 days     Creat  Date Value Ref Range Status  08/07/2021 0.84 0.70 - 1.30 mg/dL Final         Passed - K in normal range and within 180 days    Potassium  Date Value Ref Range Status  08/07/2021 4.7 3.5 - 5.3 mmol/L Final         Passed - Patient is not pregnant      Passed - Valid encounter within last 6 months    Recent Outpatient Visits          2 days ago Abdominal bloating with cramps   [place], DO   2 months ago Hyponatremia   [place], DO   2 months ago Diarrhea, unspecified type   [place], NP   4 months ago Plantar fasciitis of right foot   [place]  [place], DO   6 months ago Acute bilateral low back pain with left-sided sciatica   [place], [place]

## 2021-10-25 ENCOUNTER — Other Ambulatory Visit: Payer: Self-pay

## 2021-10-25 ENCOUNTER — Ambulatory Visit (INDEPENDENT_AMBULATORY_CARE_PROVIDER_SITE_OTHER): Payer: Medicare Other

## 2021-10-25 DIAGNOSIS — E1159 Type 2 diabetes mellitus with other circulatory complications: Secondary | ICD-10-CM

## 2021-10-25 DIAGNOSIS — Z0000 Encounter for general adult medical examination without abnormal findings: Secondary | ICD-10-CM

## 2021-10-25 DIAGNOSIS — Z1211 Encounter for screening for malignant neoplasm of colon: Secondary | ICD-10-CM | POA: Diagnosis not present

## 2021-10-25 MED ORDER — ONETOUCH ULTRASOFT LANCETS MISC: 5 refills | Status: DC

## 2021-10-25 MED ORDER — ONETOUCH VERIO VI STRP: ORAL_STRIP | 5 refills | Status: DC

## 2021-10-25 MED ORDER — ONETOUCH VERIO W/DEVICE KIT: PACK | 0 refills | Status: DC

## 2021-10-25 NOTE — Progress Notes (Signed)
Virtual Visit via Telephone Note  I connected with Tyler Martinez on 10/25/21 at  2:00 PM EST by telephone and verified that I am speaking with the correct person using two identifiers.  Location: Patient: Tyler Martinez  Provider:  Nobie Putnam, DO   I discussed the limitations, risks, security and privacy concerns of performing an evaluation and management service by telephone and the availability of in person appointments. I also discussed with the patient that there may be a patient responsible charge related to this service. The patient expressed understanding and agreed to proceed.    I provided 40 minutes of non-face-to-face time during this encounter.   Wilson Singer, CMA   HPI:  Patient presents to clinic today for their subsequent annual Medicare wellness exam.  Past Medical History:  Diagnosis Date   Anxiety    Arthritis    Cardiac arrest [facility]) 2008   Coronary artery disease    Diabetes mellitus without complication ([place])    Heart attack ([place])    Hyperlipidemia    Hypertension    MI (myocardial infarction) ([place])    Stroke [facility])     Current Outpatient Medications  Medication Sig Dispense Refill   amLODipine (NORVASC) 10 MG tablet Take 1 tablet (10 mg total) by mouth daily. 30 tablet 5   carvedilol (COREG) 12.5 MG tablet Take 1 tablet (12.5 mg total) by mouth 2 (two) times daily with a meal. 60 tablet 5   clopidogrel (PLAVIX) 75 MG tablet TAKE 1 TABLET BY MOUTH EVERY DAY 90 tablet 1   folic acid (FOLVITE) 1 MG tablet Take 1 tablet (1 mg total) by mouth daily. 90 tablet 3   gabapentin (NEURONTIN) 800 MG tablet TAKE 1 TABLET BY MOUTH 4 TIMES DAILY. 120 tablet 5   isosorbide mononitrate (IMDUR) 30 MG 24 hr tablet Take 1 tablet (30 mg total) by mouth daily. 90 tablet 3   Lancets (ONETOUCH ULTRASOFT) lancets Use to check blood sugar up to twice a day. 200 each 5   lisinopril (ZESTRIL) 40 MG tablet TAKE 1 TABLET BY MOUTH EVERY DAY 90 tablet 0   metFORMIN  (GLUCOPHAGE) 1000 MG tablet TAKE 1 TABLET BY MOUTH TWICE A DAY WITH A MEAL 180 tablet 1   sodium chloride 1 g tablet Take 1 tablet (1 g total) by mouth 2 (two) times daily with a meal. 30 tablet 2   traMADol (ULTRAM) 50 MG tablet Take 1 tablet (50 mg total) by mouth every 8 (eight) hours as needed. 60 tablet 2   Blood Glucose Monitoring Suppl (ONETOUCH VERIO) w/Device KIT Use to check blood sugar up to twice a day. (Patient not taking: Reported on 10/25/2021) 1 kit 0   ezetimibe (ZETIA) 10 MG tablet Take 1 tablet (10 mg total) by mouth daily. 30 tablet 0   Multiple Vitamin (MULTIVITAMIN WITH MINERALS) TABS tablet Take 1 tablet by mouth daily. (Patient not taking: Reported on 10/25/2021)     nicotine (NICODERM CQ - DOSED IN MG/24 HOURS) 21 mg/24hr patch Place 1 patch (21 mg total) onto the skin daily. (Patient not taking: Reported on 10/25/2021) 28 patch 0   ONETOUCH VERIO test strip Use to check blood sugar up to twice a day. (Patient not taking: Reported on 10/25/2021) 200 each 5   rosuvastatin (CRESTOR) 20 MG tablet Take 1 tablet (20 mg total) by mouth at bedtime. 30 tablet 0   No current facility-administered medications for this visit.    Allergies  Allergen Reactions   Leflunomide Nausea  And Vomiting   Methotrexate Derivatives Nausea And Vomiting    Have RA but can not tolerate any  Of the med  No components found with this name: [id number] saw Rheumatology in 2005   Other Nausea And Vomiting    Have RA but can not tolerate any  Of the med  No components found with this name: [id number] saw Rheumatology in 2005    Family History  Problem Relation Age of Onset   Cancer Mother    Hyperlipidemia Father    Hypertension Father    Heart attack Father 57   Diabetes Father    Hypertension Brother     Social History   Socioeconomic History   Marital status: Divorced    Spouse name: Not on file   Number of children: 2   Years of education: Not on file   Highest education level: Not on  file  Occupational History   Occupation: disability  Tobacco Use   Smoking status: Every Day    Packs/day: 1.00    Years: 8.00    Pack years: 8.00    Types: Cigarettes   Smokeless tobacco: Never  Vaping Use   Vaping Use: Never used  Substance and Sexual Activity   Alcohol use: Yes    Alcohol/week: 2.0 standard drinks    Types: 2 Standard drinks or equivalent per week    Comment: Drinks mixed drinks every evening.   Drug use: Not Currently   Sexual activity: Yes  Other Topics Concern   Not on file  Social History Narrative   Not on file   Social Determinants of Health   Financial Resource Strain: Low Risk    Difficulty of Paying Living Expenses: Not hard at all  Food Insecurity: No Food Insecurity   Worried About Running Out of Food in the Last Year: Never true   [place] in the Last Year: Never true  Transportation Needs: No Transportation Needs   Lack of Transportation (Medical): No   Lack of Transportation (Non-Medical): No  Physical Activity: Sufficiently Active   Days of Exercise per Week: 7 days   Minutes of Exercise per Session: 30 min  Stress: No Stress Concern Present   Feeling of Stress : Not at all  Social Connections: Moderately Isolated   Frequency of Communication with Friends and Family: Once a week   Frequency of Social Gatherings with Friends and Family: Three times a week   Attends Religious Services: 1 to 4 times per year   Active Member of Clubs or Organizations: No   Attends Archivist Meetings: Never   Marital Status: Divorced  Human resources officer Violence: Not At Risk   Fear of Current or Ex-Partner: No   Emotionally Abused: No   Physically Abused: No   Sexually Abused: No    Hospitiliaztions: No hospitalization in the past 12 mths.   Health Maintenance:    Flu :07/27/21  Tetanus:Not-up-to-date. The pt will get at his pharmacy   Pneumovax: 08/02/2016    Shingrix: Not- Up-to-date. The pt will get at his pharmacy.  Covid:  Declined   Colon Screening: Referral placed   Eye Doctor: Referral placed   Dental Exam: Declined    Providers:   PCP: Nobie Putnam, DO  Cardiologist: Ida Rogue, MD             Podiatry: Boneta Lucks, MD  :  I have personally reviewed and have noted:  1. The patient's medical and social history 2. Their use of  alcohol, tobacco or illicit drugs 3. Their current medications and supplements 4. The patient's functional ability including ADL's, fall risks, home safety risks and hearing or visual impairment. 5. Diet and physical activities 6. Evidence for depression or mood disorder  Subjective:   Review of Systems:   Constitutional: Denies fever, malaise, fatigue, headache or abrupt weight changes.  HEENT: Denies eye pain, eye redness, ear pain, ringing in the ears, wax buildup, runny nose, nasal congestion, bloody nose, or sore throat. Respiratory: Denies difficulty breathing, shortness of breath, cough or sputum production.   Cardiovascular: Denies chest pain, chest tightness, palpitations or swelling in the hands or feet.  Gastrointestinal: Denies abdominal pain, bloating, constipation, diarrhea or blood in the stool.  GU: Denies urgency, frequency, pain with urination, burning sensation, blood in urine, odor or discharge. Musculoskeletal: Denies decrease in range of motion, difficulty with gait, muscle pain or joint pain and swelling.  Skin: Denies redness, rashes, lesions or ulcercations.  Neurological: Denies dizziness, difficulty with memory, difficulty with speech or problems with balance and coordination.  Psych: Denies anxiety, depression, SI/HI.  No other specific complaints in a complete review of systems (except as listed in HPI above).  Objective:  PE:   There were no vitals taken for this visit. Wt Readings from Last 3 Encounters:  10/16/21 217 lb 12.8 oz (98.8 kg)  08/07/21 230 lb 12.8 oz (104.7 kg)  07/29/21 207 lb (93.9 kg)     BMET     Component Value Date/Time   NA 139 08/07/2021 1450   K 4.7 08/07/2021 1450   CL 105 08/07/2021 1450   CO2 28 08/07/2021 1450   GLUCOSE 146 (H) 08/07/2021 1450   BUN 17 08/07/2021 1450   CREATININE 0.84 08/07/2021 1450   CALCIUM 9.1 08/07/2021 1450   GFRNONAA >60 07/30/2021 0610   GFRNONAA 102 06/03/2019 1123   GFRAA 118 06/03/2019 1123    Lipid Panel     Component Value Date/Time   CHOL 244 (H) 05/19/2021 0112   TRIG 112 05/19/2021 0112   HDL 106 05/19/2021 0112   CHOLHDL 2.3 05/19/2021 0112   VLDL 22 05/19/2021 0112   LDLCALC 116 (H) 05/19/2021 0112   LDLCALC 69 03/20/2021 1110    CBC    Component Value Date/Time   WBC 6.3 07/30/2021 0610   RBC 3.48 (L) 07/30/2021 0610   HGB 12.4 (L) 07/30/2021 0610   HCT 35.4 (L) 07/30/2021 0610   PLT 253 07/30/2021 0610   MCV 101.7 (H) 07/30/2021 0610   MCH 35.6 (H) 07/30/2021 0610   MCHC 35.0 07/30/2021 0610   RDW 12.6 07/30/2021 0610   LYMPHSABS 1,484 03/20/2021 1110   EOSABS 290 03/20/2021 1110   BASOSABS 41 03/20/2021 1110    Hgb A1C Lab Results  Component Value Date   HGBA1C 6.8 (H) 05/18/2021      Assessment and Plan:   Medicare Annual Wellness Visit:  Diet:DM if diabetic Physical activity: mild to moderate Exercise  Depression/mood screen: Positive   Flowsheet Row Clinical Support from 10/25/2021 in [place]  PHQ-9 Total Score 8      Hearing: Intact to whispered voice Visual acuity: Grossly normal, performs annual eye exam  ADLs: Capable Fall risk: None Home safety: Good Home clear of any clutter, loose rugs, cords or loose rails.  Cognitive evaluation:  6CIT Screen 10/25/2021 08/09/2020  What Year? 0 points 0 points  What month? 0 points 0 points  What time? 0 points 0 points  Count back  from 20 0 points 0 points  Months in reverse 0 points 4 points  Repeat phrase 2 points 2 points  Total Score 2 6     EOL planning: No Adv directives, full code  Nurse's Notes: referral  place to gastroenterology for screening colonoscopy and ophthalmology. He was given information on [facility] and will call and schedule an appointment. I also resubmitted the prescription for his glucometer and test strips to [facility] instead of [facility].    Next appointment: no upcoming appt scheduled.   Wilson Singer, CMA

## 2021-10-26 ENCOUNTER — Other Ambulatory Visit: Payer: Self-pay

## 2021-10-26 DIAGNOSIS — Z1211 Encounter for screening for malignant neoplasm of colon: Secondary | ICD-10-CM

## 2021-10-26 MED ORDER — PEG 3350-KCL-NA BICARB-NACL 420 G PO SOLR: 4000.0000 mL | Freq: Once | ORAL | 0 refills | Status: AC

## 2021-10-26 MED ORDER — PEG 3350-KCL-NA BICARB-NACL 420 G PO SOLR: 4000.0000 mL | Freq: Once | ORAL | 0 refills | Status: DC

## 2021-10-26 NOTE — Progress Notes (Signed)
Gastroenterology Pre-Procedure Review ? ?Request Date: 11/01/2021 ?Requesting Physician: Dr. Marius Ditch ? ?PATIENT REVIEW QUESTIONS: The patient responded to the following health history questions as indicated:   ? ?1. Are you having any GI issues? yes (Gas) ?2. Do you have a personal history of Polyps?  Unsure of last colonoscopy. Polyps were removed. ?3. Do you have a family history of Colon Cancer or Polyps? no ?4. 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 Joint replacements in the past 12 months?no ?6. Major health problems in the past 3 months?no ?7. Any artificial heart valves, MVP, or defibrillator?no ?   ?MEDICATIONS & ALLERGIES:    ?Patient reports the following regarding taking any anticoagulation/antiplatelet therapy:   ?Plavix, Coumadin, Eliquis, Xarelto, Lovenox, Pradaxa, Brilinta, or Effient? yes (Plavix 75 mg) ?Aspirin? no ? ?Patient confirms/reports the following medications:  ?Current Outpatient Medications  ?Medication Sig Dispense Refill  ? amLODipine (NORVASC) 10 MG tablet Take 1 tablet (10 mg total) by mouth daily. 30 tablet 5  ? Blood Glucose Monitoring Suppl (ONETOUCH VERIO) w/Device KIT Use to check blood sugar up to twice a day. 1 kit 0  ? carvedilol (COREG) 12.5 MG tablet Take 1 tablet (12.5 mg total) by mouth 2 (two) times daily with a meal. 60 tablet 5  ? clopidogrel (PLAVIX) 75 MG tablet TAKE 1 TABLET BY MOUTH EVERY DAY 90 tablet 1  ? ezetimibe (ZETIA) 10 MG tablet Take 1 tablet (10 mg total) by mouth daily. 30 tablet 0  ? folic acid (FOLVITE) 1 MG tablet Take 1 tablet (1 mg total) by mouth daily. 90 tablet 3  ? gabapentin (NEURONTIN) 800 MG tablet TAKE 1 TABLET BY MOUTH 4 TIMES DAILY. 120 tablet 5  ? isosorbide mononitrate (IMDUR) 30 MG 24 hr tablet Take 1 tablet (30 mg total) by mouth daily. 90 tablet 3  ? Lancets (ONETOUCH ULTRASOFT) lancets Use to check blood sugar up to twice a day. 200 each 5  ? lisinopril (ZESTRIL) 40 MG tablet TAKE 1 TABLET BY MOUTH EVERY DAY 90 tablet 0  ? metFORMIN  (GLUCOPHAGE) 1000 MG tablet TAKE 1 TABLET BY MOUTH TWICE A DAY WITH A MEAL 180 tablet 1  ? Multiple Vitamin (MULTIVITAMIN WITH MINERALS) TABS tablet Take 1 tablet by mouth daily. (Patient not taking: Reported on 10/25/2021)    ? nicotine (NICODERM CQ - DOSED IN MG/24 HOURS) 21 mg/24hr patch Place 1 patch (21 mg total) onto the skin daily. (Patient not taking: Reported on 10/25/2021) 28 patch 0  ? ONETOUCH VERIO test strip Use to check blood sugar up to twice a day. 200 each 5  ? rosuvastatin (CRESTOR) 20 MG tablet Take 1 tablet (20 mg total) by mouth at bedtime. 30 tablet 0  ? sodium chloride 1 g tablet Take 1 tablet (1 g total) by mouth 2 (two) times daily with a meal. 30 tablet 2  ? traMADol (ULTRAM) 50 MG tablet Take 1 tablet (50 mg total) by mouth every 8 (eight) hours as needed. 60 tablet 2  ? ?No current facility-administered medications for this visit.  ? ? ?Patient confirms/reports the following allergies:  ?Allergies  ?Allergen Reactions  ? Leflunomide Nausea And Vomiting  ? Methotrexate Derivatives Nausea And Vomiting  ?  Have RA but can not tolerate any  Of the med  No components found with this name: [id number] saw Rheumatology in 2005  ? Other Nausea And Vomiting  ?  Have RA but can not tolerate any  Of the med  No components found with this  name: [id number] saw Rheumatology in 2005  ? ? ?No orders of the defined types were placed in this encounter. ? ? ?AUTHORIZATION INFORMATION ?Primary Insurance: ?1D#: ?Group #: ? ?Secondary Insurance: ?1D#: ?Group #: ? ?SCHEDULE INFORMATION: ?Date: 11/01/2021 ?Time: ?Location: [facility] ? ?

## 2021-10-30 ENCOUNTER — Telehealth: Payer: Self-pay

## 2021-10-30 NOTE — Telephone Encounter (Signed)
Contacted patient regarding blood thinner clearance. Unable to leave message. My chart message sent to patient regarding instructions for stopping Plavix. Clearance will be scanned into chart. ?

## 2021-11-01 ENCOUNTER — Ambulatory Visit
Admission: RE | Admit: 2021-11-01 | Discharge: 2021-11-01 | Disposition: A | Discharge status: Home / Self Care | Payer: Medicare Other | Attending: Gastroenterology | Admitting: Gastroenterology

## 2021-11-01 ENCOUNTER — Telehealth: Payer: Self-pay

## 2021-11-01 ENCOUNTER — Other Ambulatory Visit: Payer: Self-pay

## 2021-11-01 ENCOUNTER — Encounter: Payer: Self-pay | Admitting: Anesthesiology

## 2021-11-01 ENCOUNTER — Encounter
Admission: RE | Disposition: A | Discharge status: Home / Self Care | Payer: Self-pay | Source: Home / Self Care | Attending: Gastroenterology

## 2021-11-01 DIAGNOSIS — R109 Unspecified abdominal pain: Secondary | ICD-10-CM

## 2021-11-01 DIAGNOSIS — Z538 Procedure and treatment not carried out for other reasons: Secondary | ICD-10-CM | POA: Diagnosis not present

## 2021-11-01 DIAGNOSIS — Z1211 Encounter for screening for malignant neoplasm of colon: Secondary | ICD-10-CM | POA: Diagnosis not present

## 2021-11-01 DIAGNOSIS — R140 Abdominal distension (gaseous): Secondary | ICD-10-CM

## 2021-11-01 SURGERY — COLONOSCOPY WITH PROPOFOL
Anesthesia: General

## 2021-11-01 MED ORDER — GOLYTELY 236 G PO SOLR: 4000.0000 mL | Freq: Once | ORAL | 0 refills | Status: AC

## 2021-11-01 NOTE — OR Nursing (Signed)
Reports he did not stop Plavix. Also states he did not start having bowel movements until this am. Ate regular diet yesterday. Dr. Marius Ditch spoke with patient and plan to reschedule for Friday, 3/10. Instructions for reprep and clear liquid diet tomorrow given. Dishcharged via ambulation. ?

## 2021-11-01 NOTE — Telephone Encounter (Signed)
Called patient to verify why he did not stop his Plavix that was instructed the day procedure was scheduled. Patient states he must have forgotten what was instructed. Pt states he stopped Plavix yesterday. Patient has been reschedule for 03/10. Per Vanga/Ashley ?

## 2021-11-02 ENCOUNTER — Encounter: Payer: Self-pay | Admitting: Gastroenterology

## 2021-11-03 ENCOUNTER — Ambulatory Visit
Admission: RE | Admit: 2021-11-03 | Discharge: 2021-11-03 | Disposition: A | Discharge status: Home / Self Care | Payer: Medicare Other | Attending: Gastroenterology | Admitting: Gastroenterology

## 2021-11-03 ENCOUNTER — Encounter: Payer: Self-pay | Admitting: Gastroenterology

## 2021-11-03 ENCOUNTER — Ambulatory Visit: Payer: Medicare Other | Admitting: Anesthesiology

## 2021-11-03 ENCOUNTER — Other Ambulatory Visit: Payer: Self-pay

## 2021-11-03 ENCOUNTER — Encounter
Admission: RE | Disposition: A | Discharge status: Home / Self Care | Payer: Self-pay | Source: Home / Self Care | Attending: Gastroenterology

## 2021-11-03 DIAGNOSIS — Z794 Long term (current) use of insulin: Secondary | ICD-10-CM | POA: Insufficient documentation

## 2021-11-03 DIAGNOSIS — J449 Chronic obstructive pulmonary disease, unspecified: Secondary | ICD-10-CM | POA: Insufficient documentation

## 2021-11-03 DIAGNOSIS — F17200 Nicotine dependence, unspecified, uncomplicated: Secondary | ICD-10-CM | POA: Diagnosis not present

## 2021-11-03 DIAGNOSIS — I10 Essential (primary) hypertension: Secondary | ICD-10-CM | POA: Diagnosis not present

## 2021-11-03 DIAGNOSIS — K5730 Diverticulosis of large intestine without perforation or abscess without bleeding: Secondary | ICD-10-CM | POA: Diagnosis not present

## 2021-11-03 DIAGNOSIS — Z1211 Encounter for screening for malignant neoplasm of colon: Secondary | ICD-10-CM | POA: Diagnosis not present

## 2021-11-03 DIAGNOSIS — D128 Benign neoplasm of rectum: Secondary | ICD-10-CM | POA: Diagnosis not present

## 2021-11-03 DIAGNOSIS — R109 Unspecified abdominal pain: Secondary | ICD-10-CM

## 2021-11-03 DIAGNOSIS — K3189 Other diseases of stomach and duodenum: Secondary | ICD-10-CM | POA: Insufficient documentation

## 2021-11-03 DIAGNOSIS — K644 Residual hemorrhoidal skin tags: Secondary | ICD-10-CM | POA: Insufficient documentation

## 2021-11-03 DIAGNOSIS — K2980 Duodenitis without bleeding: Secondary | ICD-10-CM | POA: Insufficient documentation

## 2021-11-03 DIAGNOSIS — E109 Type 1 diabetes mellitus without complications: Secondary | ICD-10-CM | POA: Diagnosis not present

## 2021-11-03 DIAGNOSIS — D126 Benign neoplasm of colon, unspecified: Secondary | ICD-10-CM

## 2021-11-03 DIAGNOSIS — I2510 Atherosclerotic heart disease of native coronary artery without angina pectoris: Secondary | ICD-10-CM | POA: Insufficient documentation

## 2021-11-03 DIAGNOSIS — I252 Old myocardial infarction: Secondary | ICD-10-CM | POA: Insufficient documentation

## 2021-11-03 DIAGNOSIS — D123 Benign neoplasm of transverse colon: Secondary | ICD-10-CM | POA: Diagnosis not present

## 2021-11-03 DIAGNOSIS — D122 Benign neoplasm of ascending colon: Secondary | ICD-10-CM | POA: Diagnosis not present

## 2021-11-03 DIAGNOSIS — R140 Abdominal distension (gaseous): Secondary | ICD-10-CM | POA: Diagnosis not present

## 2021-11-03 DIAGNOSIS — K259 Gastric ulcer, unspecified as acute or chronic, without hemorrhage or perforation: Secondary | ICD-10-CM | POA: Diagnosis not present

## 2021-11-03 DIAGNOSIS — Z79899 Other long term (current) drug therapy: Secondary | ICD-10-CM | POA: Insufficient documentation

## 2021-11-03 DIAGNOSIS — R1010 Upper abdominal pain, unspecified: Secondary | ICD-10-CM | POA: Diagnosis not present

## 2021-11-03 DIAGNOSIS — D127 Benign neoplasm of rectosigmoid junction: Secondary | ICD-10-CM | POA: Diagnosis not present

## 2021-11-03 DIAGNOSIS — K2960 Other gastritis without bleeding: Secondary | ICD-10-CM

## 2021-11-03 DIAGNOSIS — K635 Polyp of colon: Secondary | ICD-10-CM | POA: Diagnosis not present

## 2021-11-03 DIAGNOSIS — K2990 Gastroduodenitis, unspecified, without bleeding: Secondary | ICD-10-CM | POA: Diagnosis not present

## 2021-11-03 HISTORY — PX: ESOPHAGOGASTRODUODENOSCOPY (EGD) WITH PROPOFOL: SHX5813

## 2021-11-03 HISTORY — PX: COLONOSCOPY WITH PROPOFOL: SHX5780

## 2021-11-03 LAB — GLUCOSE, CAPILLARY: Glucose-Capillary: 135 mg/dL — ABNORMAL HIGH (ref 70–99)

## 2021-11-03 SURGERY — COLONOSCOPY WITH PROPOFOL
Anesthesia: General

## 2021-11-03 MED ORDER — DEXMEDETOMIDINE HCL IN NACL 80 MCG/20ML IV SOLN
INTRAVENOUS | Status: AC
  Filled 2021-11-03: qty 20

## 2021-11-03 MED ORDER — LIDOCAINE HCL (CARDIAC) PF 100 MG/5ML IV SOSY
PREFILLED_SYRINGE | INTRAVENOUS | Status: DC | PRN
Start: 2021-11-03 — End: 2021-11-03
  Administered 2021-11-03: 100 mg via INTRAVENOUS

## 2021-11-03 MED ORDER — EPHEDRINE 5 MG/ML INJ
INTRAVENOUS | Status: AC
  Filled 2021-11-03: qty 5

## 2021-11-03 MED ORDER — PROPOFOL 500 MG/50ML IV EMUL
INTRAVENOUS | Status: AC
  Filled 2021-11-03: qty 50

## 2021-11-03 MED ORDER — PROPOFOL 500 MG/50ML IV EMUL
INTRAVENOUS | Status: DC | PRN
Start: 2021-11-03 — End: 2021-11-03
  Administered 2021-11-03: 100 ug/kg/min via INTRAVENOUS

## 2021-11-03 MED ORDER — DEXMEDETOMIDINE HCL IN NACL 200 MCG/50ML IV SOLN
INTRAVENOUS | Status: DC | PRN
  Administered 2021-11-03 (×2): 20 ug via INTRAVENOUS

## 2021-11-03 MED ORDER — SODIUM CHLORIDE 0.9 % IV SOLN: INTRAVENOUS | Status: DC

## 2021-11-03 MED ORDER — FENTANYL CITRATE (PF) 100 MCG/2ML IJ SOLN
INTRAMUSCULAR | Status: AC
  Filled 2021-11-03: qty 2

## 2021-11-03 MED ORDER — MIDAZOLAM HCL 2 MG/2ML IJ SOLN
INTRAMUSCULAR | Status: AC
  Filled 2021-11-03: qty 2

## 2021-11-03 MED ORDER — FENTANYL CITRATE (PF) 100 MCG/2ML IJ SOLN
INTRAMUSCULAR | Status: DC | PRN
  Administered 2021-11-03 (×2): 50 ug via INTRAVENOUS

## 2021-11-03 MED ORDER — LIDOCAINE HCL (PF) 2 % IJ SOLN
INTRAMUSCULAR | Status: AC
  Filled 2021-11-03: qty 5

## 2021-11-03 MED ORDER — MIDAZOLAM HCL 2 MG/2ML IJ SOLN
INTRAMUSCULAR | Status: DC | PRN
Start: 2021-11-03 — End: 2021-11-03
  Administered 2021-11-03 (×2): 2 mg via INTRAVENOUS

## 2021-11-03 MED ORDER — EPHEDRINE SULFATE (PRESSORS) 50 MG/ML IJ SOLN
INTRAMUSCULAR | Status: DC | PRN
  Administered 2021-11-03 (×2): 10 mg via INTRAVENOUS
  Administered 2021-11-03: 15 mg via INTRAVENOUS
  Administered 2021-11-03: 5 mg via INTRAVENOUS
  Administered 2021-11-03: 10 mg via INTRAVENOUS

## 2021-11-03 MED ORDER — PROPOFOL 10 MG/ML IV BOLUS
INTRAVENOUS | Status: DC | PRN
  Administered 2021-11-03: 50 mg via INTRAVENOUS
  Administered 2021-11-03: 30 mg via INTRAVENOUS

## 2021-11-03 NOTE — Op Note (Signed)
[facility] Gastroenterology Patient Name: Tyler Martinez Procedure Date: 11/03/2021 12:03 PM MRN: [id number] Account #: [id number] Date of Birth: 07-18-1971 Admit Type: Outpatient Age: 51 Room: [facility] ENDO ROOM 1 Gender: Male Note Status: Finalized Instrument Name: Jasper Riling [id number] Procedure:             Colonoscopy Indications:           Screening for colorectal malignant neoplasm, This is                         the patient's first colonoscopy Providers:             Lin Landsman MD, MD Referring MD:          Olin Hauser (Referring MD) Medicines:             General Anesthesia Complications:         No immediate complications. Estimated blood loss: None. Procedure:             Pre-Anesthesia Assessment:                        - Prior to the procedure, a History and Physical was                         performed, and patient medications and allergies were                         reviewed. The patient is competent. The risks and                         benefits of the procedure and the sedation options and                         risks were discussed with the patient. All questions                         were answered and informed consent was obtained.                         Patient identification and proposed procedure were                         verified by the physician, the nurse, the                         anesthesiologist, the anesthetist and the technician                         in the pre-procedure area in the procedure room in the                         endoscopy suite. Mental Status Examination: alert and                         oriented. Airway Examination: normal oropharyngeal                         airway and neck mobility. Respiratory Examination:  clear to auscultation. CV Examination: normal.                         Prophylactic Antibiotics: The patient does not require                          prophylactic antibiotics. Prior Anticoagulants: The                         patient has taken Plavix (clopidogrel), last dose was                         4 days prior to procedure. ASA Grade Assessment: III -                         A patient with severe systemic disease. After                         reviewing the risks and benefits, the patient was                         deemed in satisfactory condition to undergo the                         procedure. The anesthesia plan was to use general                         anesthesia. Immediately prior to administration of                         medications, the patient was re-assessed for adequacy                         to receive sedatives. The heart rate, respiratory                         rate, oxygen saturations, blood pressure, adequacy of                         pulmonary ventilation, and response to care were                         monitored throughout the procedure. The physical                         status of the patient was re-assessed after the                         procedure.                        After obtaining informed consent, the colonoscope was                         passed under direct vision. Throughout the procedure,                         the patient's blood pressure, pulse, and oxygen  saturations were monitored continuously. The                         Colonoscope was introduced through the anus and                         advanced to the the cecum, identified by appendiceal                         orifice and ileocecal valve. The colonoscopy was                         performed with moderate difficulty due to inadequate                         bowel prep, significant looping and the patient's body                         habitus. Successful completion of the procedure was                         aided by applying abdominal pressure and lavage. The                         patient  tolerated the procedure well. The quality of                         the bowel preparation was evaluated using the BBPS                         [facility] Bowel Preparation Scale) with scores of: Right                         Colon = 2 (minor amount of residual staining, small                         fragments of stool and/or opaque liquid, but mucosa                         seen well), Transverse Colon = 2 (minor amount of                         residual staining, small fragments of stool and/or                         opaque liquid, but mucosa seen well) and Left Colon =                         2 (minor amount of residual staining, small fragments                         of stool and/or opaque liquid, but mucosa seen well).                         The total BBPS score equals 6. Findings:      The perianal and digital rectal examinations were normal. Pertinent  negatives include normal sphincter tone and no palpable rectal lesions.      Two sessile polyps were found in the ascending colon and cecum. The       polyps were 3 to 4 mm in size. These polyps were removed with a cold       snare. Resection and retrieval were complete. Estimated blood loss: none.      A 10 mm polyp was found in the transverse colon. The polyp was       pedunculated. The polyp was removed with a hot snare. Resection and       retrieval were complete. Estimated blood loss: none.      Two sessile polyps were found in the recto-sigmoid colon. The polyps       were 7 to 9 mm in size. These polyps were removed with a hot snare.       Resection and retrieval were complete.      Three sessile polyps were found in the rectum and recto-sigmoid colon.       The polyps were 4 to 6 mm in size. These polyps were removed with a cold       snare. Resection and retrieval were complete.      Non-bleeding external hemorrhoids were found during retroflexion. The       hemorrhoids were medium-sized. Impression:            - Two 3  to 4 mm polyps in the ascending colon and in                         the cecum, removed with a cold snare. Resected and                         retrieved.                        - One 10 mm polyp in the transverse colon, removed                         with a hot snare. Resected and retrieved.                        - Two 7 to 9 mm polyps at the recto-sigmoid colon,                         removed with a hot snare. Resected and retrieved.                        - Three 4 to 6 mm polyps in the rectum and at the                         recto-sigmoid colon, removed with a cold snare.                         Resected and retrieved.                        - Non-bleeding external hemorrhoids. Recommendation:        - Discharge patient to home (with escort).                        -  Resume previous diet today.                        - Continue present medications.                        - Await pathology results.                        - Repeat colonoscopy in 1 year with 2 day prep because                         the bowel preparation was suboptimal and for                         surveillance of multiple polyps. Procedure Code(s):     --- Professional ---                        [phone], Colonoscopy, flexible; with removal of                         tumor(s), polyp(s), or other lesion(s) by snare                         technique Diagnosis Code(s):     --- Professional ---                        Z12.11, Encounter for screening for malignant neoplasm                         of colon                        K62.1, Rectal polyp                        K64.4, Residual hemorrhoidal skin tags                        K63.5, Polyp of colon CPT copyright 2019 [employer]. All rights reserved. The codes documented in this report are preliminary and upon coder review may  be revised to meet current compliance requirements. Dr. Ulyess Mort Lin Landsman MD, MD 11/03/2021 1:02:29  PM This report has been signed electronically. Number of Addenda: 0 Note Initiated On: 11/03/2021 12:03 PM Scope Withdrawal Time: 0 hours 22 minutes 39 seconds  Total Procedure Duration: 0 hours 25 minutes 13 seconds  Estimated Blood Loss:  Estimated blood loss: none.      [facility]

## 2021-11-03 NOTE — H&P (Signed)
?Tyler Darby, MD ?[address]  ?[address]  ?[place], [place] [address]  ?Main: [phone]  ?Fax: [phone] ?Pager: [phone] ? ?Primary Care Physician:  Tyler Hauser, DO ?Primary Gastroenterologist:  Dr. Cephas Martinez ? ?Pre-Procedure History & Physical: ?HPI:  Tyler Martinez is a 51 y.o. male is here for an endoscopy and colonoscopy. ?  ?Past Medical History:  ?Diagnosis Date  ? Anxiety   ? Arthritis   ? Cardiac arrest [facility]) 2008  ? Coronary artery disease   ? Diabetes mellitus without complication ([place])   ? Heart attack ([place])   ? Hyperlipidemia   ? Hypertension   ? MI (myocardial infarction) ([place])   ? Stroke [facility])   ? ? ?Past Surgical History:  ?Procedure Laterality Date  ? CAROTID STENT  2008  ? EXPLORATION POST OPERATIVE OPEN HEART  2008  ? HERNIA REPAIR  2008  ? inguinal  ? LEFT HEART CATH AND CORONARY ANGIOGRAPHY N/A 05/19/2021  ? Procedure: LEFT HEART CATH AND CORONARY ANGIOGRAPHY;  Surgeon: Wellington Hampshire, MD;  Location: [place] CV LAB;  Service: Cardiovascular;  Laterality: N/A;  ? ? ?Prior to Admission medications   ?Medication Sig Start Date End Date Taking? Authorizing Provider  ?amLODipine (NORVASC) 10 MG tablet Take 1 tablet (10 mg total) by mouth daily. 08/07/21  Yes Tyler Martinez, Tyler Doughty, DO  ?carvedilol (COREG) 12.5 MG tablet Take 1 tablet (12.5 mg total) by mouth 2 (two) times daily with a meal. 08/07/21  Yes Tyler Martinez, Tyler Doughty, DO  ?gabapentin (NEURONTIN) 800 MG tablet TAKE 1 TABLET BY MOUTH 4 TIMES DAILY. 06/27/21  Yes Tyler Martinez, Tyler Doughty, DO  ?lisinopril (ZESTRIL) 40 MG tablet TAKE 1 TABLET BY MOUTH EVERY DAY 09/22/21  Yes Tyler Martinez, Tyler Doughty, DO  ?sodium chloride 1 g tablet Take 1 tablet (1 g total) by mouth 2 (two) times daily with a meal. 10/16/21  Yes Parks Ranger, Tyler Doughty, DO  ?Blood Glucose Monitoring Suppl (ONETOUCH VERIO) w/Device KIT Use to check blood sugar up to twice a day. 10/25/21   Tyler Hauser, DO  ?clopidogrel  (PLAVIX) 75 MG tablet TAKE 1 TABLET BY MOUTH EVERY DAY 09/21/21   Tyler Hauser, DO  ?ezetimibe (ZETIA) 10 MG tablet Take 1 tablet (10 mg total) by mouth daily. 05/21/21 06/26/21  Sidney Ace, MD  ?folic acid (FOLVITE) 1 MG tablet Take 1 tablet (1 mg total) by mouth daily. 08/07/21   Tyler Martinez, Tyler Doughty, DO  ?isosorbide mononitrate (IMDUR) 30 MG 24 hr tablet Take 1 tablet (30 mg total) by mouth daily. 05/25/21   Furth, Cadence H, PA-C  ?Lancets (ONETOUCH ULTRASOFT) lancets Use to check blood sugar up to twice a day. 10/25/21   Tyler Martinez, Tyler Doughty, DO  ?metFORMIN (GLUCOPHAGE) 1000 MG tablet TAKE 1 TABLET BY MOUTH TWICE A DAY WITH A MEAL 08/14/21   Tyler Hauser, DO  ?Multiple Vitamin (MULTIVITAMIN WITH MINERALS) TABS tablet Take 1 tablet by mouth daily. ?Patient not taking: Reported on 10/25/2021 06/28/21   Enzo Bi, MD  ?nicotine (NICODERM CQ - DOSED IN MG/24 HOURS) 21 mg/24hr patch Place 1 patch (21 mg total) onto the skin daily. ?Patient not taking: Reported on 10/25/2021 05/21/21   Sidney Ace, MD  ?[facility] VERIO test strip Use to check blood sugar up to twice a day. 10/25/21   Tyler Martinez, Tyler Doughty, DO  ?rosuvastatin (CRESTOR) 20 MG tablet Take 1 tablet (20 mg total) by mouth at bedtime. 05/20/21 06/26/21  Sidney Ace, MD  ?  traMADol (ULTRAM) 50 MG tablet Take 1 tablet (50 mg total) by mouth every 8 (eight) hours as needed. 08/07/21   Tyler Hauser, DO  ? ? ?Allergies as of 11/01/2021 - Review Complete 11/01/2021  ?Allergen Reaction Noted  ? Leflunomide Nausea And Vomiting 12/19/2011  ? Methotrexate derivatives Nausea And Vomiting 12/19/2011  ? Other Nausea And Vomiting 12/19/2011  ? ? ?Family History  ?Problem Relation Age of Onset  ? Cancer Mother   ? Hyperlipidemia Father   ? Hypertension Father   ? Heart attack Father 8  ? Diabetes Father   ? Hypertension Brother   ? ? ?Social History  ? ?Socioeconomic History  ? Marital status: Divorced  ?  Spouse  name: Not on file  ? Number of children: 2  ? Years of education: Not on file  ? Highest education level: Not on file  ?Occupational History  ? Occupation: disability  ?Tobacco Use  ? Smoking status: Every Day  ?  Packs/day: 1.00  ?  Years: 8.00  ?  Pack years: 8.00  ?  Types: Cigarettes  ? Smokeless tobacco: Never  ?Vaping Use  ? Vaping Use: Never used  ?Substance and Sexual Activity  ? Alcohol use: Yes  ?  Alcohol/week: 2.0 standard drinks  ?  Types: 2 Standard drinks or equivalent per week  ?  Comment: Drinks mixed drinks every evening.no etoh SINCE 11AM YESTERDAY  ? Drug use: Not Currently  ? Sexual activity: Yes  ?Other Topics Concern  ? Not on file  ?Social History Narrative  ? Not on file  ? ?Social Determinants of Health  ? ?Financial Resource Strain: Low Risk   ? Difficulty of Paying Living Expenses: Not hard at all  ?Food Insecurity: No Food Insecurity  ? Worried About Charity fundraiser in the Last Year: Never true  ? Ran Out of Food in the Last Year: Never true  ?Transportation Needs: No Transportation Needs  ? Lack of Transportation (Medical): No  ? Lack of Transportation (Non-Medical): No  ?Physical Activity: Sufficiently Active  ? Days of Exercise per Week: 7 days  ? Minutes of Exercise per Session: 30 min  ?Stress: No Stress Concern Present  ? Feeling of Stress : Not at all  ?Social Connections: Moderately Isolated  ? Frequency of Communication with Friends and Family: Once a week  ? Frequency of Social Gatherings with Friends and Family: Three times a week  ? Attends Religious Services: 1 to 4 times per year  ? Active Member of Clubs or Organizations: No  ? Attends Archivist Meetings: Never  ? Marital Status: Divorced  ?Intimate Partner Violence: Not At Risk  ? Fear of Current or Ex-Partner: No  ? Emotionally Abused: No  ? Physically Abused: No  ? Sexually Abused: No  ? ? ?Review of Systems: ?See HPI, otherwise negative ROS ? ?Physical Exam: ?BP (!) 166/111   Pulse 69   Temp 97.7 ?F  (36.5 ?C) (Temporal)   Resp 18   Ht 6' (1.829 m)   Wt 98.9 kg   SpO2 95%   BMI 29.57 kg/m?  ?General:   Alert,  pleasant and cooperative in NAD ?Head:  Normocephalic and atraumatic. ?Neck:  Supple; no masses or thyromegaly. ?Lungs:  Clear throughout to auscultation.    ?Heart:  Regular rate and rhythm. ?Abdomen:  Soft, nontender and nondistended. Normal bowel sounds, without guarding, and without rebound.   ?Neurologic:  Alert and  oriented x4;  grossly normal neurologically. ? ?  Impression/Plan: ?Tyler Martinez is here for an endoscopy and colonoscopy to be performed for upper abdominal pain, colon cancer screening ? ?Risks, benefits, limitations, and alternatives regarding  endoscopy and colonoscopy have been reviewed with the patient.  Questions have been answered.  All parties agreeable. ? ? ?Sherri Sear, MD  11/03/2021, 12:03 PM ?

## 2021-11-03 NOTE — Anesthesia Postprocedure Evaluation (Signed)
Anesthesia Post Note ? ?Patient: Tyler Martinez ? ?Procedure(s) Performed: COLONOSCOPY WITH PROPOFOL ?ESOPHAGOGASTRODUODENOSCOPY (EGD) WITH PROPOFOL ? ?Patient location during evaluation: PACU ?Anesthesia Type: General ?Level of consciousness: awake and awake and alert ?Pain management: satisfactory to patient ?Vital Signs Assessment: post-procedure vital signs reviewed and stable ?Respiratory status: spontaneous breathing and respiratory function stable ?Cardiovascular status: stable ?Anesthetic complications: no ? ? ?No notable events documented. ? ? ?Last Vitals:  ?Vitals:  ? 11/03/21 1305 11/03/21 1309  ?BP: (!) 85/58 (!) 85/59  ?Pulse: 73 70  ?Resp: 18 16  ?Temp:    ?SpO2: 92% 92%  ?  ?Last Pain:  ?Vitals:  ? 11/03/21 1123  ?TempSrc: Temporal  ?PainSc: 1   ? ? ?  ?  ?  ?  ?  ?  ? ?VAN STAVEREN,Dyasia Firestine ? ? ? ? ?

## 2021-11-03 NOTE — Transfer of Care (Signed)
Immediate Anesthesia Transfer of Care Note ? ?Patient: Tyler Martinez ? ?Procedure(s) Performed: COLONOSCOPY WITH PROPOFOL ?ESOPHAGOGASTRODUODENOSCOPY (EGD) WITH PROPOFOL ? ?Patient Location: PACU ? ?Anesthesia Type:General ? ?Level of Consciousness: sedated ? ?Airway & Oxygen Therapy: Patient Spontanous Breathing and Patient connected to nasal cannula oxygen ? ?Post-op Assessment: Report given to RN and Post -op Vital signs reviewed and stable ? ?Post vital signs: Reviewed and stable ? ?Last Vitals:  ?Vitals Value Taken Time  ?BP    ?Temp    ?Pulse 75 11/03/21 1303  ?Resp 19 11/03/21 1303  ?SpO2 92 % 11/03/21 1303  ?Vitals shown include unvalidated device data. ? ?Last Pain:  ?Vitals:  ? 11/03/21 1123  ?TempSrc: Temporal  ?PainSc: 1   ?   ? ?  ? ?Complications: No notable events documented. ?

## 2021-11-03 NOTE — Op Note (Signed)
[facility] ?Gastroenterology ?Patient Name: Tyler Martinez ?Procedure Date: 11/03/2021 12:08 PM ?MRN: [id number] ?Account #: [id number] ?Date of Birth: 04/13/1971 ?Admit Type: Outpatient ?Age: 51 ?Room: [facility] ENDO ROOM 1 ?Gender: Male ?Note Status: Finalized ?Instrument Name: Upper Endoscope [id number] ?Procedure:             Upper GI endoscopy ?Indications:           Upper abdominal pain ?Providers:             Lin Landsman MD, MD ?Referring MD:          Olin Hauser (Referring MD) ?Medicines:             General Anesthesia ?Complications:         No immediate complications. Estimated blood loss: None. ?Procedure:             Pre-Anesthesia Assessment: ?                       - Prior to the procedure, a History and Physical was  ?                       performed, and patient medications and allergies were  ?                       reviewed. The patient is competent. The risks and  ?                       benefits of the procedure and the sedation options and  ?                       risks were discussed with the patient. All questions  ?                       were answered and informed consent was obtained.  ?                       Patient identification and proposed procedure were  ?                       verified by the physician, the nurse, the  ?                       anesthesiologist, the anesthetist and the technician  ?                       in the pre-procedure area in the procedure room in the  ?                       endoscopy suite. Mental Status Examination: alert and  ?                       oriented. Airway Examination: normal oropharyngeal  ?                       airway and neck mobility. Respiratory Examination:  ?                       clear to auscultation. CV Examination: normal.  ?  Prophylactic Antibiotics: The patient does not require  ?                       prophylactic antibiotics. Prior Anticoagulants: The  ?                       patient  has taken Plavix (clopidogrel), last dose was  ?                       4 days prior to procedure. ASA Grade Assessment: III -  ?                       A patient with severe systemic disease. After  ?                       reviewing the risks and benefits, the patient was  ?                       deemed in satisfactory condition to undergo the  ?                       procedure. The anesthesia plan was to use general  ?                       anesthesia. Immediately prior to administration of  ?                       medications, the patient was re-assessed for adequacy  ?                       to receive sedatives. The heart rate, respiratory  ?                       rate, oxygen saturations, blood pressure, adequacy of  ?                       pulmonary ventilation, and response to care were  ?                       monitored throughout the procedure. The physical  ?                       status of the patient was re-assessed after the  ?                       procedure. ?                       After obtaining informed consent, the endoscope was  ?                       passed under direct vision. Throughout the procedure,  ?                       the patient's blood pressure, pulse, and oxygen  ?                       saturations were monitored continuously. The Endoscope  ?  was introduced through the mouth, and advanced to the  ?                       second part of duodenum. The upper GI endoscopy was  ?                       accomplished without difficulty. The patient tolerated  ?                       the procedure well. ?Findings: ?     Diffuse mild inflammation characterized by congestion (edema), erythema,  ?     friability and linear erosions was found in the second portion of the  ?     duodenum. Biopsies were taken with a cold forceps for histology. ?     The duodenal bulb was normal. ?     Diffuse mildly erythematous mucosa without bleeding was found in the  ?     gastric antrum  and in the prepyloric region of the stomach. Biopsies  ?     were taken with a cold forceps for histology. ?     A few localized small erosions with no bleeding and no stigmata of  ?     recent bleeding were found on the greater curvature of the gastric body.  ?     Biopsies were taken with a cold forceps for Helicobacter pylori testing. ?     The cardia and gastric fundus were normal on retroflexion. ?     Esophagogastric landmarks were identified: the gastroesophageal junction  ?     was found at 41 cm from the incisors. ?     The gastroesophageal junction and examined esophagus were normal. ?Impression:            - Duodenitis. Biopsied. ?                       - Normal duodenal bulb. ?                       - Erythematous mucosa in the antrum and prepyloric  ?                       region of the stomach. Biopsied. ?                       - Erosive gastropathy with no bleeding and no stigmata  ?                       of recent bleeding. Biopsied. ?                       - Esophagogastric landmarks identified. ?                       - Normal gastroesophageal junction and esophagus. ?Recommendation:        - Await pathology results. ?                       - Proceed with colonoscopy as scheduled ?                       See colonoscopy report ?Procedure Code(s):     ---  Professional --- ?                       [phone], Esophagogastroduodenoscopy, flexible,  ?                       transoral; with biopsy, single or multiple ?Diagnosis Code(s):     --- Professional --- ?                       K29.80, Duodenitis without bleeding ?                       K31.89, Other diseases of stomach and duodenum ?                       R10.10, Upper abdominal pain, unspecified ?CPT copyright 2019 American Medical Association. All rights reserved. ?The codes documented in this report are preliminary and upon coder review may  ?be revised to meet current compliance requirements. ?Dr. Ulyess Mort ?Bradyn Soward Raeanne Gathers MD, MD ?11/03/2021  12:31:09 PM ?This report has been signed electronically. ?Number of Addenda: 0 ?Note Initiated On: 11/03/2021 12:08 PM ?Estimated Blood Loss:  Estimated blood loss: none. ?     [facility] ?

## 2021-11-03 NOTE — Anesthesia Preprocedure Evaluation (Signed)
Anesthesia Evaluation  ?Patient identified by MRN, date of birth, ID band ?Patient awake ? ? ? ?Reviewed: ?Allergy & Precautions, NPO status , Patient's Chart, lab work & pertinent test results ? ?Airway ?Mallampati: II ? ?TM Distance: >3 FB ?Neck ROM: full ? ? ? Dental ? ?(+) Edentulous Upper, Edentulous Lower ?  ?Pulmonary ?neg pulmonary ROS, COPD,  COPD inhaler, Current Smoker and Patient abstained from smoking.,  ?  ?Pulmonary exam normal ? ?+ decreased breath sounds ? ? ? ? ? Cardiovascular ?Exercise Tolerance: Good ?hypertension, Pt. on medications ?+ CAD and + Past MI  ?negative cardio ROS ?Normal cardiovascular exam ?Rhythm:Regular Rate:Normal ? ? ?  ?Neuro/Psych ?Anxiety CVA, No Residual Symptoms negative neurological ROS ? negative psych ROS  ? GI/Hepatic ?negative GI ROS, Neg liver ROS,   ?Endo/Other  ?negative endocrine ROSdiabetes, Well Controlled, Type 1, Insulin Dependent ? Renal/GU ?negative Renal ROS  ?negative genitourinary ?  ?Musculoskeletal ? ?(+) Arthritis ,  ? Abdominal ?(+) + obese,   ?Peds ?negative pediatric ROS ?(+)  Hematology ?negative hematology ROS ?(+)   ?Anesthesia Other Findings ?Past Medical History: ?No date: Anxiety ?No date: Arthritis ?2008: Cardiac arrest [facility]) ?No date: Coronary artery disease ?No date: Diabetes mellitus without complication ([place]) ?No date: Heart attack [facility]) ?No date: Hyperlipidemia ?No date: Hypertension ?No date: MI (myocardial infarction) ([place]) ?No date: Stroke [facility]) ? ?Past Surgical History: ?2008: CAROTID STENT ?2008: EXPLORATION POST OPERATIVE OPEN HEART ?2008: HERNIA REPAIR ?    Comment:  inguinal ?05/19/2021: LEFT HEART CATH AND CORONARY ANGIOGRAPHY; N/A ?    Comment:  Procedure: LEFT HEART CATH AND CORONARY ANGIOGRAPHY;   ?             Surgeon: Wellington Hampshire, MD;  Location: [place]  ?             CV LAB;  Service: Cardiovascular;  Laterality: N/A; ? ? ? ? Reproductive/Obstetrics ?negative OB ROS ? ?   ? ? ? ? ? ? ? ? ? ? ? ? ? ?  ?  ? ? ? ? ? ? ? ? ?Anesthesia Physical ?Anesthesia Plan ? ?ASA: 3 ? ?Anesthesia Plan: General  ? ?Post-op Pain Management:   ? ?Induction: Intravenous ? ?PONV Risk Score and Plan: Propofol infusion and TIVA ? ?Airway Management Planned:  ? ?Additional Equipment:  ? ?Intra-op Plan:  ? ?Post-operative Plan:  ? ?Informed Consent: I have reviewed the patients History and Physical, chart, labs and discussed the procedure including the risks, benefits and alternatives for the proposed anesthesia with the patient or authorized representative who has indicated his/her understanding and acceptance.  ? ? ? ?Dental Advisory Given ? ?Plan Discussed with: CRNA and Surgeon ? ?Anesthesia Plan Comments:   ? ? ? ? ? ? ?Anesthesia Quick Evaluation ? ?

## 2021-11-06 ENCOUNTER — Encounter: Payer: Self-pay | Admitting: Gastroenterology

## 2021-11-06 LAB — SURGICAL PATHOLOGY

## 2021-11-13 ENCOUNTER — Telehealth: Payer: Self-pay | Admitting: Family Medicine

## 2021-11-13 NOTE — Telephone Encounter (Signed)
Copied from [place] [id number]. Topic: General - Other ?>> Nov 13, 2021 11:21 AM Tessa Lerner A wrote: ?Reason for CRM: The patient would like to speak with a member of clinical staff regarding the results from their recent colonoscopy on 11/03/21 ? ?The patient has stressed the urgency of their request ? ?Please contact further when available ? ? ? ?Called pt, no answer and his VM isn't set up. He needs to call [facility] in [place] to discuss these results since this is related to his colonoscopy.  ?

## 2021-11-13 NOTE — Telephone Encounter (Signed)
Pt reaching out again from this am as he thinks he has cancer and really needs his lab results, [place] [phone] [place] ?

## 2021-11-13 NOTE — Telephone Encounter (Signed)
Pt called again and given # of [facility] for results ?

## 2021-11-14 ENCOUNTER — Other Ambulatory Visit: Payer: Self-pay | Admitting: Family Medicine

## 2021-11-14 ENCOUNTER — Telehealth: Payer: Self-pay | Admitting: Gastroenterology

## 2021-11-14 ENCOUNTER — Encounter: Payer: Self-pay | Admitting: Family Medicine

## 2021-11-14 DIAGNOSIS — I10 Essential (primary) hypertension: Secondary | ICD-10-CM

## 2021-11-14 DIAGNOSIS — I2510 Atherosclerotic heart disease of native coronary artery without angina pectoris: Secondary | ICD-10-CM

## 2021-11-14 NOTE — Telephone Encounter (Signed)
Pt is requesting results from his colonoscpy and his endoscopy would like a call back ?

## 2021-11-14 NOTE — Telephone Encounter (Signed)
Called patient and went over patient letter that was sent to [facility] and he verbalized understanding  ?

## 2021-11-16 ENCOUNTER — Other Ambulatory Visit: Payer: Self-pay | Admitting: Family Medicine

## 2021-11-16 DIAGNOSIS — I2510 Atherosclerotic heart disease of native coronary artery without angina pectoris: Secondary | ICD-10-CM

## 2021-11-16 DIAGNOSIS — I10 Essential (primary) hypertension: Secondary | ICD-10-CM

## 2021-11-16 NOTE — Telephone Encounter (Signed)
Coreg dose inconsistent with current med list. ?Requested Prescriptions  ?Pending Prescriptions Disp Refills  ?? carvedilol (COREG) 6.25 MG tablet [Pharmacy Med Name: CARVEDILOL 6.25 MG TABLET] 180 tablet 0  ?  Sig: TAKE 1 TABLET BY MOUTH 2 TIMES DAILY WITH A MEAL.  ?  ? Cardiovascular: Beta Blockers 3 Passed - 11/14/2021  2:07 PM  ?  ?  Passed - Cr in normal range and within 360 days  ?  Creat  ?Date Value Ref Range Status  ?08/07/2021 0.84 0.70 - 1.30 mg/dL Final  ?   ?  ?  Passed - AST in normal range and within 360 days  ?  AST  ?Date Value Ref Range Status  ?07/28/2021 17 15 - 41 U/L Final  ?   ?  ?  Passed - ALT in normal range and within 360 days  ?  ALT  ?Date Value Ref Range Status  ?07/28/2021 16 0 - 44 U/L Final  ?   ?  ?  Passed - Last BP in normal range  ?  BP Readings from Last 1 Encounters:  ?11/03/21 120/81  ?   ?  ?  Passed - Last Heart Rate in normal range  ?  Pulse Readings from Last 1 Encounters:  ?11/03/21 77  ?   ?  ?  Passed - Valid encounter within last 6 months  ?  Recent Outpatient Visits   ?      ? 1 month ago Abdominal bloating with cramps  ? [place], DO  ? 3 months ago Hyponatremia  ? [place], DO  ? 3 months ago Diarrhea, unspecified type  ? [facility] [place], [place] W, NP  ? 5 months ago Plantar fasciitis of right foot  ? [place], DO  ? 7 months ago Acute bilateral low back pain with left-sided sciatica  ? [place], DO  ?  ?  ? ?  ?  ?  ?? lisinopril (ZESTRIL) 40 MG tablet [Pharmacy Med Name: LISINOPRIL 40 MG TABLET] 90 tablet 0  ?  Sig: TAKE 1 TABLET BY MOUTH EVERY DAY  ?  ? Cardiovascular:  ACE Inhibitors Passed - 11/14/2021  2:07 PM  ?  ?  Passed - Cr in normal range and within 180 days  ?  Creat  ?Date Value Ref Range Status  ?08/07/2021 0.84 0.70 - 1.30 mg/dL Final  ?   ?  ?  Passed - K in  normal range and within 180 days  ?  Potassium  ?Date Value Ref Range Status  ?08/07/2021 4.7 3.5 - 5.3 mmol/L Final  ?   ?  ?  Passed - Patient is not pregnant  ?  ?  Passed - Last BP in normal range  ?  BP Readings from Last 1 Encounters:  ?11/03/21 120/81  ?   ?  ?  Passed - Valid encounter within last 6 months  ?  Recent Outpatient Visits   ?      ? 1 month ago Abdominal bloating with cramps  ? [place], DO  ? 3 months ago Hyponatremia  ? [place], DO  ? 3 months ago Diarrhea, unspecified type  ? [facility] [place], [place] W, NP  ? 5 months ago Plantar fasciitis of right foot  ?  [place], DO  ? 7 months ago Acute bilateral low back pain with left-sided sciatica  ? [place], DO  ?  ?  ? ?  ?  ?  ? ? ?

## 2021-11-17 ENCOUNTER — Other Ambulatory Visit: Payer: Self-pay | Admitting: Family Medicine

## 2021-11-17 DIAGNOSIS — G894 Chronic pain syndrome: Secondary | ICD-10-CM

## 2021-11-17 DIAGNOSIS — M25311 Other instability, right shoulder: Secondary | ICD-10-CM

## 2021-11-17 DIAGNOSIS — M25312 Other instability, left shoulder: Secondary | ICD-10-CM

## 2021-11-17 DIAGNOSIS — I10 Essential (primary) hypertension: Secondary | ICD-10-CM

## 2021-11-17 DIAGNOSIS — G8929 Other chronic pain: Secondary | ICD-10-CM

## 2021-11-17 DIAGNOSIS — I2510 Atherosclerotic heart disease of native coronary artery without angina pectoris: Secondary | ICD-10-CM

## 2021-11-18 NOTE — Telephone Encounter (Signed)
Requested Prescriptions  ?Pending Prescriptions Disp Refills  ?? carvedilol (COREG) 12.5 MG tablet [Pharmacy Med Name: CARVEDILOL 12.5 MG TABLET] 180 tablet 0  ?  Sig: TAKE 1 TABLET (12.'5MG'$  TOTAL) BY MOUTH TWICE A DAY WITH MEALS  ?  ? Cardiovascular: Beta Blockers 3 Passed - 11/16/2021 12:15 PM  ?  ?  Passed - Cr in normal range and within 360 days  ?  Creat  ?Date Value Ref Range Status  ?08/07/2021 0.84 0.70 - 1.30 mg/dL Final  ?   ?  ?  Passed - AST in normal range and within 360 days  ?  AST  ?Date Value Ref Range Status  ?07/28/2021 17 15 - 41 U/L Final  ?   ?  ?  Passed - ALT in normal range and within 360 days  ?  ALT  ?Date Value Ref Range Status  ?07/28/2021 16 0 - 44 U/L Final  ?   ?  ?  Passed - Last BP in normal range  ?  BP Readings from Last 1 Encounters:  ?11/03/21 120/81  ?   ?  ?  Passed - Last Heart Rate in normal range  ?  Pulse Readings from Last 1 Encounters:  ?11/03/21 77  ?   ?  ?  Passed - Valid encounter within last 6 months  ?  Recent Outpatient Visits   ?      ? 1 month ago Abdominal bloating with cramps  ? [place], DO  ? 3 months ago Hyponatremia  ? [place], DO  ? 3 months ago Diarrhea, unspecified type  ? [facility] [place], [place] W, NP  ? 5 months ago Plantar fasciitis of right foot  ? [place], DO  ? 7 months ago Acute bilateral low back pain with left-sided sciatica  ? [place], DO  ?  ?  ? ?  ?  ?  ? ?

## 2021-11-20 NOTE — Telephone Encounter (Signed)
Requested medications are due for refill today.  Tramadol yes, Coreg no ? ?Requested medications are on the active medications list.  Tramadol yes, Coreg no ? ?Last refill. Tramadol 08/07/2021 #60 2 refills, Coreg 6.25 05/02/2021 ? ?Future visit scheduled.   no ? ?Notes to clinic.  Coreg 6.25 was discontinued 05/20/2021. Tramadol is not delegated. ? ? ? ? ?Requested Prescriptions  ?Pending Prescriptions Disp Refills  ? carvedilol (COREG) 6.25 MG tablet [Pharmacy Med Name: CARVEDILOL 6.25 MG TABLET] 180 tablet 0  ?  Sig: TAKE 1 TABLET BY MOUTH 2 TIMES DAILY WITH A MEAL.  ?  ? Cardiovascular: Beta Blockers 3 Passed - 11/17/2021  8:17 AM  ?  ?  Passed - Cr in normal range and within 360 days  ?  Creat  ?Date Value Ref Range Status  ?08/07/2021 0.84 0.70 - 1.30 mg/dL Final  ?  ?  ?  ?  Passed - AST in normal range and within 360 days  ?  AST  ?Date Value Ref Range Status  ?07/28/2021 17 15 - 41 U/L Final  ?  ?  ?  ?  Passed - ALT in normal range and within 360 days  ?  ALT  ?Date Value Ref Range Status  ?07/28/2021 16 0 - 44 U/L Final  ?  ?  ?  ?  Passed - Last BP in normal range  ?  BP Readings from Last 1 Encounters:  ?11/03/21 120/81  ?  ?  ?  ?  Passed - Last Heart Rate in normal range  ?  Pulse Readings from Last 1 Encounters:  ?11/03/21 77  ?  ?  ?  ?  Passed - Valid encounter within last 6 months  ?  Recent Outpatient Visits   ? ?      ? 1 month ago Abdominal bloating with cramps  ? [place], DO  ? 3 months ago Hyponatremia  ? [place], DO  ? 3 months ago Diarrhea, unspecified type  ? [facility] [place], [place], NP  ? 5 months ago Plantar fasciitis of right foot  ? [place], DO  ? 7 months ago Acute bilateral low back pain with left-sided sciatica  ? [place], DO  ? ?  ?  ? ?  ?  ?  ? traMADol (ULTRAM) 50 MG tablet [Pharmacy  Med Name: TRAMADOL HCL 50 MG TABLET] 60 tablet 2  ?  Sig: TAKE 1 TABLET BY MOUTH EVERY 8 HOURS AS NEEDED.  ?  ? Not Delegated - Analgesics:  Opioid Agonists Failed - 11/17/2021  8:17 AM  ?  ?  Failed - This refill cannot be delegated  ?  ?  Passed - Urine Drug Screen completed in last 360 days  ?  ?  Passed - Valid encounter within last 3 months  ?  Recent Outpatient Visits   ? ?      ? 1 month ago Abdominal bloating with cramps  ? [place], DO  ? 3 months ago Hyponatremia  ? [place], DO  ? 3 months ago Diarrhea, unspecified type  ? [facility] [place], [place], NP  ? 5 months ago Plantar fasciitis of right foot  ? [place], DO  ? 7 months ago  Acute bilateral low back pain with left-sided sciatica  ? [place], DO  ? ?  ?  ? ?  ?  ?  ?  ?

## 2021-11-21 ENCOUNTER — Other Ambulatory Visit: Payer: Self-pay | Admitting: Family Medicine

## 2021-11-21 DIAGNOSIS — I10 Essential (primary) hypertension: Secondary | ICD-10-CM

## 2021-11-22 NOTE — Telephone Encounter (Signed)
Norvasc mg refill request refused because it's being requested too soon. ?

## 2021-11-28 ENCOUNTER — Ambulatory Visit
Admission: EM | Admit: 2021-11-28 | Discharge: 2021-11-28 | Disposition: A | Discharge status: Other Acute Inpatient Hospital | Payer: Medicare Other | Attending: Emergency Medicine | Admitting: Emergency Medicine

## 2021-11-28 ENCOUNTER — Other Ambulatory Visit: Payer: Self-pay

## 2021-11-28 ENCOUNTER — Ambulatory Visit
Admission: RE | Admit: 2021-11-28 | Discharge: 2021-11-28 | Disposition: A | Discharge status: Home / Self Care | Payer: Medicare Other | Source: Ambulatory Visit | Attending: Emergency Medicine | Admitting: Emergency Medicine

## 2021-11-28 DIAGNOSIS — M79662 Pain in left lower leg: Secondary | ICD-10-CM

## 2021-11-28 DIAGNOSIS — R600 Localized edema: Secondary | ICD-10-CM | POA: Insufficient documentation

## 2021-11-28 DIAGNOSIS — M7989 Other specified soft tissue disorders: Secondary | ICD-10-CM | POA: Diagnosis not present

## 2021-11-28 DIAGNOSIS — M79605 Pain in left leg: Secondary | ICD-10-CM | POA: Diagnosis not present

## 2021-11-28 DIAGNOSIS — S8012XA Contusion of left lower leg, initial encounter: Secondary | ICD-10-CM | POA: Diagnosis not present

## 2021-11-28 NOTE — ED Provider Notes (Signed)
?[facility] ? ? ? ?CSN: [id number] ?Arrival date & time: 11/28/21  1421 ? ? ?  ? ?History   ?Chief Complaint ?Chief Complaint  ?Patient presents with  ? Leg Swelling  ? ? ?HPI ?Tyler Martinez is a 51 y.o. male.  ? ?HPI ? ?51 year old male here for evaluation of leg swelling. ? ?Patient reports that he has been experiencing swelling in both of his lower legs for the past 2 days.  Yesterday he noticed a knot on the posterior medial aspect of his left calf that is painful.  Today the swelling has gone down but the bruise remains and it continues to be tender.  Patient has been treating the pain with tramadol.  He is typically on a water pill but he has stopped taking it as of late because his sodium levels have been off.  He denies any chest pain, shortness of breath, dizziness.  No injury and no history of blood clots.  Patient is a smoker and he is concerned that he may have a blood clot. ? ?Past Medical History:  ?Diagnosis Date  ? Anxiety   ? Arthritis   ? Cardiac arrest [facility]) 2008  ? Coronary artery disease   ? Diabetes mellitus without complication ([place])   ? Heart attack ([place])   ? Hyperlipidemia   ? Hypertension   ? MI (myocardial infarction) ([place])   ? Stroke [facility])   ? ? ?Patient Active Problem List  ? Diagnosis Date Noted  ? Gastritis, erosive   ? Abdominal bloating   ? Adenomatous polyp of colon   ? Colon cancer screening   ? Hypomagnesuria 07/29/2021  ? Hyponatremia 06/26/2021  ? Alcohol withdrawal ([place]) 06/26/2021  ? Polysubstance abuse ([place]) 06/26/2021  ? 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 Abnormal nuclear stress test   ? Chest pain 05/18/2021  ? CAD (coronary artery disease) 05/18/2021  ? Tobacco abuse 05/18/2021  ? History of substance abuse ([place]) 01/12/2020  ? Alcohol dependence ([place]) 01/12/2020  ? Coronary artery disease involving native coronary artery of native heart without angina pectoris 04/14/2019  ? Essential hypertension 04/14/2019  ? Hyperlipidemia associated with type 2  diabetes mellitus ([place]) 04/14/2019  ? History of cerebrovascular accident (CVA) with residual deficit 04/14/2019  ? 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 Type 2 diabetes mellitus with vascular disease ([place]) 04/14/2019  ? Diabetic polyneuropathy associated with type 2 diabetes mellitus ([place]) 04/14/2019  ? Rheumatoid arthritis involving multiple sites ([place]) 04/14/2019  ? Flat foot 04/14/2019  ? 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 ? ?Past Surgical History:  ?Procedure Laterality Date  ? CAROTID STENT  2008  ? COLONOSCOPY WITH PROPOFOL N/A 11/03/2021  ? Procedure: COLONOSCOPY WITH PROPOFOL;  Surgeon: Lin Landsman, MD;  Location[facility] ENDOSCOPY;  Service: Gastroenterology;  Laterality: N/A;  OK'D PER PM  ? ESOPHAGOGASTRODUODENOSCOPY (EGD) WITH PROPOFOL N/A 11/03/2021  ? Procedure: ESOPHAGOGASTRODUODENOSCOPY (EGD) WITH PROPOFOL;  Surgeon: Lin Landsman, MD;  Location: [facility] ENDOSCOPY;  Service: Gastroenterology;  Laterality: N/A;  ? EXPLORATION POST OPERATIVE OPEN HEART  2008  ? HERNIA REPAIR  2008  ? inguinal  ? LEFT HEART CATH AND CORONARY ANGIOGRAPHY N/A 05/19/2021  ? Procedure: LEFT HEART CATH AND CORONARY ANGIOGRAPHY;  Surgeon: Wellington Hampshire, MD;  Location: [facility] CV LAB;  Service: Cardiovascular;  Laterality: N/A;  ? ? ? ? ? ?Home Medications   ? ?Prior to Admission medications   ?Medication Sig Start Date End Date Taking? Authorizing  Provider  ?amLODipine (NORVASC) 10 MG tablet Take 1 tablet (10 mg total) by mouth daily. 08/07/21   Olin Hauser, DO  ?Blood Glucose Monitoring Suppl (ONETOUCH VERIO) w/Device KIT Use to check blood sugar up to twice a day. 10/25/21   Karamalegos, Devonne Doughty, DO  ?carvedilol (COREG) 12.5 MG tablet TAKE 1 TABLET (12.5MG TOTAL) BY MOUTH TWICE A DAY WITH MEALS 11/18/21   Parks Ranger, Devonne Doughty, DO  ?clopidogrel (PLAVIX) 75 MG tablet TAKE 1 TABLET BY MOUTH EVERY DAY 09/21/21   Parks Ranger, Devonne Doughty, DO  ?ezetimibe (ZETIA) 10  MG tablet Take 1 tablet (10 mg total) by mouth daily. 05/21/21 06/26/21  Sidney Ace, MD  ?folic acid (FOLVITE) 1 MG tablet Take 1 tablet (1 mg total) by mouth daily. 08/07/21   Karamalegos, Devonne Doughty, DO  ?gabapentin (NEURONTIN) 800 MG tablet TAKE 1 TABLET BY MOUTH 4 TIMES DAILY. 06/27/21   Karamalegos, Devonne Doughty, DO  ?isosorbide mononitrate (IMDUR) 30 MG 24 hr tablet Take 1 tablet (30 mg total) by mouth daily. 05/25/21   Furth, Cadence H, PA-C  ?Lancets (ONETOUCH ULTRASOFT) lancets Use to check blood sugar up to twice a day. 10/25/21   Karamalegos, Devonne Doughty, DO  ?lisinopril (ZESTRIL) 40 MG tablet TAKE 1 TABLET BY MOUTH EVERY DAY 11/16/21   Par[place] Ranger, Devonne Doughty, DO  ?metFORMIN (GLUCOPHAGE) 1000 MG tablet TAKE 1 TABLET BY MOUTH TWICE A DAY WITH A MEAL 08/14/21   Karamalegos, Devonne Doughty, DO  ?ONETOUCH VERIO test strip Use to check blood sugar up to twice a day. 10/25/21   Karamalegos, Devonne Doughty, DO  ?rosuvastatin (CRESTOR) 20 MG tablet Take 1 tablet (20 mg total) by mouth at bedtime. 05/20/21 06/26/21  Sidney Ace, MD  ?sodium chloride 1 g tablet Take 1 tablet (1 g total) by mouth 2 (two) times daily with a meal. 10/16/21   Karamalegos, Devonne Doughty, DO  ?traMADol (ULTRAM) 50 MG tablet Take 1 tablet (50 mg total) by mouth every 8 (eight) hours as needed. 11/20/21   Olin Hauser, DO  ? ? ?Family History ?Family History  ?Problem Relation Age of Onset  ? Cancer Mother   ? Hyperlipidemia Father   ? Hypertension Father   ? Heart attack Father 53  ? Diabetes Father   ? Hypertension Brother   ? ? ?Social History ?Social History  ? ?Tobacco Use  ? Smoking status: Every Day  ?  Packs/day: 1.00  ?  Years: 8.00  ?  Pack years: 8.00  ?  Types: Cigarettes  ? Smokeless tobacco: Never  ?Vaping Use  ? Vaping Use: Never used  ?Substance Use Topics  ? Alcohol use: Yes  ?  Alcohol/week: 2.0 standard drinks  ?  Types: 2 Standard drinks or equivalent per week  ?  Comment: Drinks mixed drinks every  evening.no etoh SINCE 11AM YESTERDAY  ? Drug use: Not Currently  ? ? ? ?Allergies   ?Leflunomide, Methotrexate derivatives, and Other ? ? ?Review of Systems ?Review of Systems  ?Respiratory:  Negative for shortness of breath.   ?Cardiovascular:  Negative for chest pain.  ?Musculoskeletal:  Positive for myalgias.  ?Skin:  Positive for color change.  ?Neurological:  Negative for dizziness and headaches.  ?Hematological: Negative.   ?Psychiatric/Behavioral: Negative.    ? ? ?Physical Exam ?Triage Vital Signs ?ED Triage Vitals  ?Enc Vitals Group  ?   BP 11/28/21 1529 (!) 158/115  ?   Pulse Rate 11/28/21 1529 78  ?   Resp 11/28/21  1529 18  ?   Temp 11/28/21 1529 98.5 ?F (36.9 ?C)  ?   Temp Source 11/28/21 1529 Oral  ?   SpO2 11/28/21 1529 95 %  ?   Weight --   ?   Height --   ?   Head Circumference --   ?   Peak Flow --   ?   Pain Score 11/28/21 1531 8  ?   Pain Loc --   ?   Pain Edu? --   ?   Excl. in [place]? --   ? ?No data found. ? ?Updated Vital Signs ?BP (!) 158/115 (BP Location: Left Arm)   Pulse 78   Temp 98.5 ?F (36.9 ?C) (Oral)   Resp 18   SpO2 95%  ? ?Visual Acuity ?Right Eye Distance:   ?Left Eye Distance:   ?Bilateral Distance:   ? ?Right Eye Near:   ?Left Eye Near:    ?Bilateral Near:    ? ?Physical Exam ?Vitals and nursing note reviewed.  ?Constitutional:   ?   Appearance: Normal appearance. He is not ill-appearing.  ?HENT:  ?   Head: Normocephalic and atraumatic.  ?Cardiovascular:  ?   Rate and Rhythm: Normal rate and regular rhythm.  ?   Pulses: Normal pulses.  ?   Heart sounds: Normal heart sounds. No murmur heard. ?  No friction rub. No gallop.  ?Pulmonary:  ?   Effort: Pulmonary effort is normal.  ?   Breath sounds: Normal breath sounds. No wheezing, rhonchi or rales.  ?Musculoskeletal:     ?   General: Tenderness present. No swelling, deformity or signs of injury. Normal range of motion.  ?   Right lower leg: Edema present.  ?   Left lower leg: Edema present.  ?Skin: ?   General: Skin is warm and dry.   ?   Capillary Refill: Capillary refill takes less than 2 seconds.  ?   Findings: Bruising present.  ?Neurological:  ?   General: No focal deficit present.  ?   Mental Status: He is alert and oriented to person, pl

## 2021-11-28 NOTE — Discharge Instructions (Addendum)
Your ultrasound today did not show any signs of a blood clot. ? ?I believe the bruise is most likely secondary to a ruptured varicose vein. ? ?You may continue to use tramadol as needed for pain. ? ?You may also apply ice to the bruised area for 20 minutes at a time 2-3 times a day. ? ?Return for reevaluation for any new or worsening symptoms. ?

## 2021-11-28 NOTE — ED Triage Notes (Addendum)
Patient presents to Urgent Care with complaints of bilateral leg swelling since yesterday. Pt states he noted bruising to left lower calf yesterday approx at 1100. Treating pain with tramadol. Pt states he has not been taking water pill because it has decreased his sodium levels.  ? ?Denies chest pain, dizziness, or SOB. No leg injury.   ?

## 2021-11-30 ENCOUNTER — Other Ambulatory Visit: Payer: Self-pay | Admitting: Family Medicine

## 2021-11-30 DIAGNOSIS — E1159 Type 2 diabetes mellitus with other circulatory complications: Secondary | ICD-10-CM

## 2021-12-01 NOTE — Telephone Encounter (Signed)
Requested Prescriptions  ?Pending Prescriptions Disp Refills  ?? Blood Glucose Monitoring Suppl (ONETOUCH VERIO REFLECT) w/Device KIT [Pharmacy Med Name: ONE TOUCH VERIO REFLECT METER] 1 kit 0  ?  Sig: USE TO CHECK BLOOD SUGAR UP TO TWICE A DAY.  ?  ? Endocrinology: Diabetes - Testing Supplies Passed - 11/30/2021  7:29 PM  ?  ?  Passed - Valid encounter within last 12 months  ?  Recent Outpatient Visits   ?      ? 1 month ago Abdominal bloating with cramps  ? [place], DO  ? 3 months ago Hyponatremia  ? [place], DO  ? 4 months ago Diarrhea, unspecified type  ? [facility] [place], [place] W, NP  ? 5 months ago Plantar fasciitis of right foot  ? [place], DO  ? 8 months ago Acute bilateral low back pain with left-sided sciatica  ? [place], DO  ?  ?  ? ?  ?  ?  ? ? ?

## 2021-12-22 ENCOUNTER — Other Ambulatory Visit: Payer: Self-pay | Admitting: Family Medicine

## 2021-12-22 DIAGNOSIS — I10 Essential (primary) hypertension: Secondary | ICD-10-CM

## 2021-12-22 DIAGNOSIS — E871 Hypo-osmolality and hyponatremia: Secondary | ICD-10-CM

## 2021-12-22 NOTE — Telephone Encounter (Signed)
Requested medications are due for refill today.  unsure ? ?Requested medications are on the active medications list.  yes ? ?Last refill. 10/16/2021 #30 2 refills ? ?Future visit scheduled.   no ? ?Notes to clinic.  Medication refill is not assigned to a protocol. Please review for refill. Pharmacy needs Dx code. ? ? ? ?Requested Prescriptions  ?Pending Prescriptions Disp Refills  ? sodium chloride 1 g tablet [Pharmacy Med Name: SODIUM CHLORIDE 1 GM TABLET] 30 tablet 2  ?  Sig: TAKE 1 TABLET (1 G TOTAL) BY MOUTH 2 (TWO) TIMES DAILY WITH A MEAL.  ?  ? Off-Protocol Failed - 12/22/2021 11:12 AM  ?  ?  Failed - Medication not assigned to a protocol, review manually.  ?  ?  Passed - Valid encounter within last 12 months  ?  Recent Outpatient Visits   ? ?      ? 2 months ago Abdominal bloating with cramps  ? [place], DO  ? 4 months ago Hyponatremia  ? [place], DO  ? 4 months ago Diarrhea, unspecified type  ? [facility] [place], [place] W, NP  ? 6 months ago Plantar fasciitis of right foot  ? [place], DO  ? 8 months ago Acute bilateral low back pain with left-sided sciatica  ? [place], DO  ? ?  ?  ? ? ?  ?  ?  ?Signed Prescriptions Disp Refills  ? amLODipine (NORVASC) 10 MG tablet 90 tablet 0  ?  Sig: TAKE 1 TABLET BY MOUTH EVERY DAY  ?  ? Cardiovascular: Calcium Channel Blockers 2 Failed - 12/22/2021 11:12 AM  ?  ?  Failed - Last BP in normal range  ?  BP Readings from Last 1 Encounters:  ?11/28/21 (!) 158/115  ?  ?  ?  ?  Passed - Last Heart Rate in normal range  ?  Pulse Readings from Last 1 Encounters:  ?11/28/21 78  ?  ?  ?  ?  Passed - Valid encounter within last 6 months  ?  Recent Outpatient Visits   ? ?      ? 2 months ago Abdominal bloating with cramps  ? [place], DO  ? 4 months  ago Hyponatremia  ? [place], DO  ? 4 months ago Diarrhea, unspecified type  ? [facility] [place], [place] W, NP  ? 6 months ago Plantar fasciitis of right foot  ? [place], DO  ? 8 months ago Acute bilateral low back pain with left-sided sciatica  ? [place], DO  ? ?  ?  ? ? ?  ?  ?  ?  ?

## 2021-12-22 NOTE — Telephone Encounter (Signed)
Requested Prescriptions  ?Pending Prescriptions Disp Refills  ?? amLODipine (NORVASC) 10 MG tablet [Pharmacy Med Name: AMLODIPINE BESYLATE 10 MG TAB] 90 tablet 0  ?  Sig: TAKE 1 TABLET BY MOUTH EVERY DAY  ?  ? Cardiovascular: Calcium Channel Blockers 2 Failed - 12/22/2021 11:12 AM  ?  ?  Failed - Last BP in normal range  ?  BP Readings from Last 1 Encounters:  ?11/28/21 (!) 158/115  ?   ?  ?  Passed - Last Heart Rate in normal range  ?  Pulse Readings from Last 1 Encounters:  ?11/28/21 78  ?   ?  ?  Passed - Valid encounter within last 6 months  ?  Recent Outpatient Visits   ?      ? 2 months ago Abdominal bloating with cramps  ? [place], DO  ? 4 months ago Hyponatremia  ? [place], DO  ? 4 months ago Diarrhea, unspecified type  ? [facility] [place], [place] W, NP  ? 6 months ago Plantar fasciitis of right foot  ? [place], DO  ? 8 months ago Acute bilateral low back pain with left-sided sciatica  ? [place], DO  ?  ?  ? ?  ?  ?  ?? sodium chloride 1 g tablet [Pharmacy Med Name: SODIUM CHLORIDE 1 GM TABLET] 30 tablet 2  ?  Sig: TAKE 1 TABLET (1 G TOTAL) BY MOUTH 2 (TWO) TIMES DAILY WITH A MEAL.  ?  ? Off-Protocol Failed - 12/22/2021 11:12 AM  ?  ?  Failed - Medication not assigned to a protocol, review manually.  ?  ?  Passed - Valid encounter within last 12 months  ?  Recent Outpatient Visits   ?      ? 2 months ago Abdominal bloating with cramps  ? [place], DO  ? 4 months ago Hyponatremia  ? [place], DO  ? 4 months ago Diarrhea, unspecified type  ? [facility] [place], [place] W, NP  ? 6 months ago Plantar fasciitis of right foot  ? [place], DO  ? 8 months ago Acute bilateral low  back pain with left-sided sciatica  ? [place], DO  ?  ?  ? ?  ?  ?  ? ?

## 2022-01-09 ENCOUNTER — Other Ambulatory Visit: Payer: Self-pay | Admitting: Family Medicine

## 2022-01-10 NOTE — Telephone Encounter (Signed)
Refilled 11/16/2021 #90 0 refills. ?Requested Prescriptions  ?Pending Prescriptions Disp Refills  ?? lisinopril (ZESTRIL) 40 MG tablet [Pharmacy Med Name: LISINOPRIL 40 MG TABLET] 90 tablet 0  ?  Sig: TAKE 1 TABLET BY MOUTH EVERY DAY  ?  ? Cardiovascular:  ACE Inhibitors Failed - 01/09/2022 10:31 AM  ?  ?  Failed - Last BP in normal range  ?  BP Readings from Last 1 Encounters:  ?11/28/21 (!) 158/115  ?   ?  ?  Passed - Cr in normal range and within 180 days  ?  Creat  ?Date Value Ref Range Status  ?08/07/2021 0.84 0.70 - 1.30 mg/dL Final  ?   ?  ?  Passed - K in normal range and within 180 days  ?  Potassium  ?Date Value Ref Range Status  ?08/07/2021 4.7 3.5 - 5.3 mmol/L Final  ?   ?  ?  Passed - Patient is not pregnant  ?  ?  Passed - Valid encounter within last 6 months  ?  Recent Outpatient Visits   ?      ? 2 months ago Abdominal bloating with cramps  ? [place], DO  ? 5 months ago Hyponatremia  ? [place], DO  ? 5 months ago Diarrhea, unspecified type  ? [facility] [place], [place] W, NP  ? 7 months ago Plantar fasciitis of right foot  ? [place], DO  ? 9 months ago Acute bilateral low back pain with left-sided sciatica  ? [place], DO  ?  ?  ? ?  ?  ?  ? ?

## 2022-01-11 ENCOUNTER — Other Ambulatory Visit: Payer: Self-pay | Admitting: Family Medicine

## 2022-01-11 DIAGNOSIS — E1142 Type 2 diabetes mellitus with diabetic polyneuropathy: Secondary | ICD-10-CM

## 2022-01-12 NOTE — Telephone Encounter (Signed)
Requested Prescriptions  Pending Prescriptions Disp Refills  . gabapentin (NEURONTIN) 800 MG tablet [Pharmacy Med Name: GABAPENTIN 800 MG TABLET] 120 tablet 5    Sig: TAKE 1 TABLET BY MOUTH FOUR TIMES A DAY     Neurology: Anticonvulsants - gabapentin Passed - 01/11/2022  5:35 PM      Passed - Cr in normal range and within 360 days    Creat  Date Value Ref Range Status  08/07/2021 0.84 0.70 - 1.30 mg/dL Final         Passed - Completed PHQ-2 or PHQ-9 in the last 360 days      Passed - Valid encounter within last 12 months    Recent Outpatient Visits          2 months ago Abdominal bloating with cramps   [place], DO   5 months ago Hyponatremia   [place], DO   5 months ago Diarrhea, unspecified type   [facility] [place], Coralie Keens, NP   7 months ago Plantar fasciitis of right foot   [place], DO   9 months ago Acute bilateral low back pain with left-sided sciatica   [place], Devonne Doughty, DO

## 2022-01-26 ENCOUNTER — Other Ambulatory Visit: Payer: Self-pay | Admitting: Family Medicine

## 2022-01-26 DIAGNOSIS — I10 Essential (primary) hypertension: Secondary | ICD-10-CM

## 2022-01-26 NOTE — Telephone Encounter (Signed)
Requested Prescriptions  Pending Prescriptions Disp Refills  . lisinopril (ZESTRIL) 40 MG tablet [Pharmacy Med Name: LISINOPRIL 40 MG TABLET] 90 tablet 0    Sig: TAKE 1 TABLET BY MOUTH EVERY DAY     Cardiovascular:  ACE Inhibitors Failed - 01/26/2022 11:05 AM      Failed - Last BP in normal range    BP Readings from Last 1 Encounters:  11/28/21 (!) 158/115         Passed - Cr in normal range and within 180 days    Creat  Date Value Ref Range Status  08/07/2021 0.84 0.70 - 1.30 mg/dL Final         Passed - K in normal range and within 180 days    Potassium  Date Value Ref Range Status  08/07/2021 4.7 3.5 - 5.3 mmol/L Final         Passed - Patient is not pregnant      Passed - Valid encounter within last 6 months    Recent Outpatient Visits          3 months ago Abdominal bloating with cramps   [place], DO   5 months ago Hyponatremia   [place], DO   6 months ago Diarrhea, unspecified type   [facility] [place], [place] W, NP   7 months ago Plantar fasciitis of right foot   [facility] [place], Devonne Doughty, DO   9 months ago Acute bilateral low back pain with left-sided sciatica   [place], DO             . amLODipine (NORVASC) 10 MG tablet [Pharmacy Med Name: AMLODIPINE BESYLATE 10 MG TAB] 90 tablet 0    Sig: TAKE 1 TABLET BY MOUTH EVERY DAY     Cardiovascular: Calcium Channel Blockers 2 Failed - 01/26/2022 11:05 AM      Failed - Last BP in normal range    BP Readings from Last 1 Encounters:  11/28/21 (!) 158/115         Passed - Last Heart Rate in normal range    Pulse Readings from Last 1 Encounters:  11/28/21 78         Passed - Valid encounter within last 6 months    Recent Outpatient Visits          3 months ago Abdominal bloating with cramps   [place], DO   5 months ago Hyponatremia   [place], DO   6 months ago Diarrhea, unspecified type   [facility] [place], [place], NP   7 months ago Plantar fasciitis of right foot   [place], DO   9 months ago Acute bilateral low back pain with left-sided sciatica   [place], DO

## 2022-02-08 ENCOUNTER — Other Ambulatory Visit: Payer: Self-pay | Admitting: Family Medicine

## 2022-02-08 DIAGNOSIS — M25312 Other instability, left shoulder: Secondary | ICD-10-CM

## 2022-02-08 DIAGNOSIS — G8929 Other chronic pain: Secondary | ICD-10-CM

## 2022-02-08 DIAGNOSIS — M25311 Other instability, right shoulder: Secondary | ICD-10-CM

## 2022-02-08 DIAGNOSIS — G894 Chronic pain syndrome: Secondary | ICD-10-CM

## 2022-02-08 NOTE — Telephone Encounter (Signed)
Requested medication (s) are due for refill today: yes  Requested medication (s) are on the active medication list: yes  Last refill:  11/20/21 #60 with 2 RF  Future visit scheduled: no, last seen 10/25/21  Notes to clinic:  This medication can not be delegated, please assess.        Requested Prescriptions  Pending Prescriptions Disp Refills   traMADol (ULTRAM) 50 MG tablet [Pharmacy Med Name: TRAMADOL HCL 50 MG TABLET] 60 tablet 2    Sig: TAKE 1 TABLET BY MOUTH EVERY 8 HOURS AS NEEDED.     Not Delegated - Analgesics:  Opioid Agonists Failed - 02/08/2022  1:04 PM      Failed - This refill cannot be delegated      Failed - Valid encounter within last 3 months    Recent Outpatient Visits           3 months ago Abdominal bloating with cramps   [place], DO   6 months ago Hyponatremia   [place], DO   6 months ago Diarrhea, unspecified type   [facility] [place], [place], NP   8 months ago Plantar fasciitis of right foot   [place], DO   10 months ago Acute bilateral low back pain with left-sided sciatica   [place] completed in last 360 days

## 2022-02-15 ENCOUNTER — Other Ambulatory Visit: Payer: Self-pay | Admitting: Family Medicine

## 2022-02-15 DIAGNOSIS — E1159 Type 2 diabetes mellitus with other circulatory complications: Secondary | ICD-10-CM

## 2022-02-15 NOTE — Telephone Encounter (Signed)
Requested Prescriptions  Pending Prescriptions Disp Refills  . metFORMIN (GLUCOPHAGE) 1000 MG tablet [Pharmacy Med Name: METFORMIN HCL 1,000 MG TABLET] 180 tablet 0    Sig: TAKE 1 TABLET BY MOUTH TWICE A DAY WITH MEALS     Endocrinology:  Diabetes - Biguanides Failed - 02/15/2022  2:52 AM      Failed - HBA1C is between 0 and 7.9 and within 180 days    Hgb A1c MFr Bld  Date Value Ref Range Status  05/18/2021 6.8 (H) 4.8 - 5.6 % Final    Comment:    (NOTE)         Prediabetes: 5.7 - 6.4         Diabetes: >6.4         Glycemic control for adults with diabetes: <7.0          Failed - B12 Level in normal range and within 720 days    No results found for: "VITAMINB12"       Passed - Cr in normal range and within 360 days    Creat  Date Value Ref Range Status  08/07/2021 0.84 0.70 - 1.30 mg/dL Final         Passed - eGFR in normal range and within 360 days    GFR, Est African American  Date Value Ref Range Status  06/03/2019 118 > OR = 60 mL/min/1.54m Final   GFR, Est Non African American  Date Value Ref Range Status  06/03/2019 102 > OR = 60 mL/min/1.734mFinal   GFR, Estimated  Date Value Ref Range Status  07/30/2021 >60 >60 mL/min Final    Comment:    (NOTE) Calculated using the CKD-EPI Creatinine Equation (2021)    eGFR  Date Value Ref Range Status  08/07/2021 106 > OR = 60 mL/min/1.7379minal    Comment:    The eGFR is based on the CKD-EPI 2021 equation. To calculate  the new eGFR from a previous Creatinine or Cystatin C result, go to https://www.kidney.org/professionals/ kdoqi/gfr%5Fcalculator          Passed - Valid encounter within last 6 months    Recent Outpatient Visits          4 months ago Abdominal bloating with cramps   [facility]   6 months ago Hyponatremia   [facility]   6 months ago Diarrhea, unspecified type   [facility]   8 months ago Plantar fasciitis of right foot   [facility]   10 months ago Acute bilateral low back pain with left-sided sciatica   [facility]             Passed - CBC within normal limits and completed in the last 12 months    WBC  Date Value Ref Range Status  07/30/2021 6.3 4.0 - 10.5 K/uL Final   RBC  Date Value Ref Range Status  07/30/2021 3.48 (L) 4.22 - 5.81 MIL/uL Final   Hemoglobin  Date Value Ref Range Status  07/30/2021 12.4 (L) 13.0 - 17.0 g/dL Final   HCT  Date Value Ref Range Status  07/30/2021 35.4 (L) 39.0 - 52.0 % Final   MCHC  Date Value Ref Range Status  07/30/2021 35.0 30.0 - 36.0 g/dL Final   [facility] Range Status  07/30/2021 35.6 (H)  26.0 - 34.0 pg Final   MCV  Date Value Ref Range Status  07/30/2021 101.7 (H) 80.0 - 100.0 fL Final   No results found for: "PLTCOUNTKUC", "LABPLAT", "POCPLA" RDW  Date Value Ref Range Status  07/30/2021 12.6 11.5 - 15.5 % Final

## 2022-02-19 ENCOUNTER — Other Ambulatory Visit: Payer: Self-pay | Admitting: Family Medicine

## 2022-02-19 DIAGNOSIS — I2510 Atherosclerotic heart disease of native coronary artery without angina pectoris: Secondary | ICD-10-CM

## 2022-03-01 ENCOUNTER — Other Ambulatory Visit: Payer: Self-pay | Admitting: Family Medicine

## 2022-03-01 DIAGNOSIS — E871 Hypo-osmolality and hyponatremia: Secondary | ICD-10-CM

## 2022-03-02 NOTE — Telephone Encounter (Signed)
Requested medication (s) are due for refill today: yes  Requested medication (s) are on the active medication list: yes  Last refill:  12/22/21 #30 2 RF  Future visit scheduled: no  Notes to clinic:  med not assigned to a protocol   Requested Prescriptions  Pending Prescriptions Disp Refills   sodium chloride 1 g tablet [Pharmacy Med Name: SODIUM CHLORIDE 1 GM TABLET] 30 tablet 2    Sig: TAKE 1 TABLET (1 G TOTAL) BY MOUTH 2 (TWO) TIMES DAILY WITH A MEAL.     Off-Protocol Failed - 03/01/2022  4:32 PM      Failed - Medication not assigned to a protocol, review manually.      Passed - Valid encounter within last 12 months    Recent Outpatient Visits           4 months ago Abdominal bloating with cramps   [place], DO   6 months ago Hyponatremia   [place], DO   7 months ago Diarrhea, unspecified type   [facility] [place]   9 months ago Plantar fasciitis of right foot   [place], DO   11 months ago Acute bilateral low back pain with left-sided sciatica   [place], [place], [place]

## 2022-03-03 ENCOUNTER — Other Ambulatory Visit: Payer: Self-pay | Admitting: Family Medicine

## 2022-03-03 DIAGNOSIS — I2510 Atherosclerotic heart disease of native coronary artery without angina pectoris: Secondary | ICD-10-CM

## 2022-03-03 DIAGNOSIS — I10 Essential (primary) hypertension: Secondary | ICD-10-CM

## 2022-03-05 ENCOUNTER — Emergency Department
Admission: EM | Admit: 2022-03-05 | Discharge: 2022-03-05 | Discharge status: Against Medical Advice | Payer: Medicare Other | Attending: Student | Admitting: Student

## 2022-03-05 ENCOUNTER — Ambulatory Visit: Payer: Self-pay | Admitting: *Deleted

## 2022-03-05 ENCOUNTER — Other Ambulatory Visit: Payer: Self-pay

## 2022-03-05 ENCOUNTER — Emergency Department: Payer: Medicare Other

## 2022-03-05 DIAGNOSIS — R1032 Left lower quadrant pain: Secondary | ICD-10-CM | POA: Insufficient documentation

## 2022-03-05 DIAGNOSIS — Z5321 Procedure and treatment not carried out due to patient leaving prior to being seen by health care provider: Secondary | ICD-10-CM | POA: Diagnosis not present

## 2022-03-05 DIAGNOSIS — R109 Unspecified abdominal pain: Secondary | ICD-10-CM | POA: Diagnosis not present

## 2022-03-05 LAB — COMPREHENSIVE METABOLIC PANEL
ALT: 26 U/L (ref 0–44)
AST: 25 U/L (ref 15–41)
Albumin: 3.9 g/dL (ref 3.5–5.0)
Alkaline Phosphatase: 53 U/L (ref 38–126)
Anion gap: 9 (ref 5–15)
BUN: 11 mg/dL (ref 6–20)
CO2: 24 mmol/L (ref 22–32)
Calcium: 8.6 mg/dL — ABNORMAL LOW (ref 8.9–10.3)
Chloride: 104 mmol/L (ref 98–111)
Creatinine, Ser: 1 mg/dL (ref 0.61–1.24)
GFR, Estimated: >60 mL/min (ref >60)
Glucose, Bld: 174 mg/dL — ABNORMAL HIGH (ref 70–99)
Potassium: 3.7 mmol/L (ref 3.5–5.1)
Sodium: 137 mmol/L (ref 135–145)
Total Bilirubin: 0.9 mg/dL (ref 0.3–1.2)
Total Protein: 6.3 g/dL — ABNORMAL LOW (ref 6.5–8.1)

## 2022-03-05 LAB — URINALYSIS, ROUTINE W REFLEX MICROSCOPIC
Bilirubin Urine: NEGATIVE
Glucose, UA: NEGATIVE mg/dL
Hgb urine dipstick: NEGATIVE
Ketones, ur: NEGATIVE mg/dL
Leukocytes,Ua: NEGATIVE
Nitrite: NEGATIVE
Protein, ur: NEGATIVE mg/dL
Specific Gravity, Urine: 1.008 (ref 1.005–1.030)
pH: 7 (ref 5.0–8.0)

## 2022-03-05 LAB — CBC WITH DIFFERENTIAL/PLATELET
Abs Immature Granulocytes: 0.03 10*3/uL (ref 0.00–0.07)
Basophils Absolute: 0.1 10*3/uL (ref 0.0–0.1)
Basophils Relative: 1 %
Eosinophils Absolute: 0.2 10*3/uL (ref 0.0–0.5)
Eosinophils Relative: 2 %
HCT: 44.3 % (ref 39.0–52.0)
Hemoglobin: 15.1 g/dL (ref 13.0–17.0)
Immature Granulocytes: 0 %
Lymphocytes Relative: 16 %
Lymphs Abs: 1.2 10*3/uL (ref 0.7–4.0)
MCH: 35 pg — ABNORMAL HIGH (ref 26.0–34.0)
MCHC: 34.1 g/dL (ref 30.0–36.0)
MCV: 102.5 fL — ABNORMAL HIGH (ref 80.0–100.0)
Monocytes Absolute: 0.8 10*3/uL (ref 0.1–1.0)
Monocytes Relative: 11 %
Neutro Abs: 5.2 10*3/uL (ref 1.7–7.7)
Neutrophils Relative %: 70 %
Platelets: 217 10*3/uL (ref 150–400)
RBC: 4.32 MIL/uL (ref 4.22–5.81)
RDW: 12.9 % (ref 11.5–15.5)
WBC: 7.5 10*3/uL (ref 4.0–10.5)
nRBC: 0 % (ref 0.0–0.2)

## 2022-03-05 LAB — LIPASE, BLOOD: Lipase: 57 U/L — ABNORMAL HIGH (ref 11–51)

## 2022-03-05 MED ORDER — IOHEXOL 300 MG/ML  SOLN
100.0000 mL | Freq: Once | INTRAMUSCULAR | Status: AC | PRN
  Administered 2022-03-05: 100 mL via INTRAVENOUS

## 2022-03-05 NOTE — ED Notes (Signed)
No answer when called several times from lobby 

## 2022-03-05 NOTE — ED Provider Triage Note (Signed)
Emergency Medicine Provider Triage Evaluation Note  Tyler Martinez , a 51 y.o. male  was evaluated in triage.  Pt complains of LLQ pain. Change in stool caliber, reports size of a pencil. No bulge. No difficulty urinating. No vomiting. No skin changes. No heavy lifting. History of hernias   Review of Systems  Positive: LLQ pain Negative: Fever, vomiting  Physical Exam  There were no vitals taken for this visit. Gen:   Awake, no distress   Resp:  Normal effort  MSK:   Moves extremities without difficulty  Other:    Medical Decision Making  Medically screening exam initiated at 5:30 PM.  Appropriate orders placed.  Gianlucca Szymborski was informed that the remainder of the evaluation will be completed by another provider, this initial triage assessment does not replace that evaluation, and the importance of remaining in the ED until their evaluation is complete.    Marquette Old, PA-C 03/05/22 1733

## 2022-03-05 NOTE — Telephone Encounter (Signed)
  Chief Complaint: abdominal pain Symptoms: left sided abdominal pain "2 inches above belt line" States has hernia at that area "Felt it tear, I've felt this before with an inguinal hernia"Pain sudden, 4am, at 6/10 presently.Some nausea  Frequency: this AM Pertinent Negatives: Patient denies  Disposition: '[x]'$ ED /'[]'$ Urgent Care (no appt availability in office) / '[]'$ Appointment(In office/virtual)/ '[]'$  [facility]/ '[]'$ Home Care/ '[]'$ Refused Recommended Disposition /'[]'$  Mobile Bus/ '[]'$  Follow-up with PCP Additional Notes: Advised ED. Pt states will follow disposition. Care advise provided, verbalizes understanding.  Reason for Disposition  [1] SEVERE pain (e.g., excruciating) AND [2] present > 1 hour  Answer Assessment - Initial Assessment Questions 1. LOCATION: "Where does it hurt?"      Left side of abdominal, 2 in above waist line 2. RADIATION: "Does the pain shoot anywhere else?" (e.g., chest, back)     no 3. ONSET: "When did the pain begin?" (Minutes, hours or days ago)      This AM 0400 4. SUDDEN: "Gradual or sudden onset?"     Sudden 5. PATTERN "Does the pain come and go, or is it constant?"    - If constant: "Is it getting better, staying the same, or worsening?"      (Note: Constant means the pain never goes away completely; most serious pain is constant and it progresses)     - If intermittent: "How long does it last?" "Do you have pain now?"     (Note: Intermittent means the pain goes away completely between bouts)     Constant 6. SEVERITY: "How bad is the pain?"  (e.g., Scale 1-10; mild, moderate, or severe)    - MILD (1-3): doesn't interfere with normal activities, abdomen soft and not tender to touch     - MODERATE (4-7): interferes with normal activities or awakens from sleep, abdomen tender to touch     - SEVERE (8-10): excruciating pain, doubled over, unable to do any normal activities       6/10 with coughing or palpating. 7. RECURRENT SYMPTOM: "Have you  ever had this type of stomach pain before?" If Yes, ask: "When was the last time?" and "What happened that time?"      Yes with hernia tear 8. CAUSE: "What do you think is causing the stomach pain?"     Hernia tear 9. RELIEVING/AGGRAVATING FACTORS: "What makes it better or worse?" (e.g., movement, antacids, bowel movement)      10. OTHER SYMPTOMS: "Do you have any other symptoms?" (e.g., back pain, diarrhea, fever, urination pain, vomiting)       Nausea at times  Protocols used: Abdominal Pain - Male-A-AH

## 2022-03-05 NOTE — ED Triage Notes (Signed)
Pt to ED via POV from home. Pt reports LLQ pain that started this morning. Pt reports hx hernias. Pt states it feels similar to prior hernias. Pt reports small BM. Pt states he feels a bulge. Pt denies urinary symptoms or N/V.

## 2022-03-05 NOTE — Telephone Encounter (Signed)
Requested Prescriptions  Pending Prescriptions Disp Refills  . carvedilol (COREG) 12.5 MG tablet [Pharmacy Med Name: CARVEDILOL 12.5 MG TABLET] 180 tablet 0    Sig: TAKE 1 TABLET (12.'5MG'$  TOTAL) BY MOUTH TWICE A DAY WITH MEALS     Cardiovascular: Beta Blockers 3 Failed - 03/03/2022 11:57 AM      Failed - Last BP in normal range    BP Readings from Last 1 Encounters:  11/28/21 (!) 158/115         Passed - Cr in normal range and within 360 days    Creat  Date Value Ref Range Status  08/07/2021 0.84 0.70 - 1.30 mg/dL Final         Passed - AST in normal range and within 360 days    AST  Date Value Ref Range Status  07/28/2021 17 15 - 41 U/L Final         Passed - ALT in normal range and within 360 days    ALT  Date Value Ref Range Status  07/28/2021 16 0 - 44 U/L Final         Passed - Last Heart Rate in normal range    Pulse Readings from Last 1 Encounters:  11/28/21 78         Passed - Valid encounter within last 6 months    Recent Outpatient Visits          4 months ago Abdominal bloating with cramps   [place], DO   7 months ago Hyponatremia   [place], DO   7 months ago Diarrhea, unspecified type   [facility] [place]   9 months ago Plantar fasciitis of right foot   [place], DO   11 months ago Acute bilateral low back pain with left-sided sciatica   [place], DO

## 2022-03-06 ENCOUNTER — Telehealth: Payer: Self-pay | Admitting: Emergency Medicine

## 2022-03-06 NOTE — Telephone Encounter (Signed)
Called patient due to left emergency department before provider exam to inquire about condition and follow up plans. I told him that his tests were done and asked him to call his pcp to have them reviewed and I told him that he really needs to be examined to determine if he has a hernia.  He will call pcp now.

## 2022-03-09 ENCOUNTER — Encounter: Payer: Self-pay | Admitting: Family Medicine

## 2022-03-09 ENCOUNTER — Ambulatory Visit (INDEPENDENT_AMBULATORY_CARE_PROVIDER_SITE_OTHER): Payer: Medicare Other | Admitting: Family Medicine

## 2022-03-09 VITALS — BP 151/100 | HR 87 | Ht 72.0 in | Wt 208.4 lb

## 2022-03-09 DIAGNOSIS — K439 Ventral hernia without obstruction or gangrene: Secondary | ICD-10-CM

## 2022-03-09 DIAGNOSIS — R1032 Left lower quadrant pain: Secondary | ICD-10-CM

## 2022-03-09 NOTE — Patient Instructions (Addendum)
Thank you for coming to the office today.  Referral for possible hernia  [facility] [address] [place],    [address] Phone: [phone]  This is caused by a weakness in your abdominal or groin muscles, and is caused by bowel or fatty tissue pushing through this weak spot causing pain and bulging.  Recommend a "Hernia Truss", try to wear this regularly (do not need to wear to bed if pain is improved), until it feels better and then only with activities - If it is not improving then you may need to wear it every day, especially if you cannot have a surgery to fix the hernia   Try to find positions that give you most relief, likely laying down with head lower than body will allow the hernia bulging to go back into place and feel better.   May take Tylenol as needed. Recommend to start taking Tylenol Extra Strength '500mg'$  tabs - take 1 to 2 tabs per dose (max '1000mg'$ ) every 6-8 hours for pain (take regularly, don't skip a dose for next 7 days), max 24 hour daily dose is 6 tablets or '3000mg'$ . In the future you can repeat the same everyday Tylenol course for 1-2 weeks at a time.    Can try topical ice packs or muscle rub if burning nerve sensation.    If significant worsening pain or you get bulging that does NOT go down or go away and CANNOT push back in, or nausea, vomiting, then it is very important to go directly to hospital ED for more immediate evaluation, as this can be a life threatening surgical emergency     Please schedule a Follow-up Appointment to: Return if symptoms worsen or fail to improve.  If you have any other questions or concerns, please feel free to call the office or send a message through [facility]. You may also schedule an earlier appointment if necessary.  Additionally, you may be receiving a survey about your experience at our office within a few days to 1 week by e-mail or mail. We value your feedback.  Nobie Putnam,  DO [place]

## 2022-03-09 NOTE — Progress Notes (Signed)
Subjective:    Patient ID: Tyler Martinez, male    DOB: 1971-06-01, 51 y.o.   MRN: [id number]  Tyler Martinez is a 51 y.o. male presenting on 03/09/2022 for Hospitalization Follow-up and Groin Pain   HPI  Left Lower Abdominal Pain / Possible Hernia  Patient went to [facility] for evaluation on 03/05/22 he had testing and CT however he left without being seen by provider.   Recent flare within past 1-2 weeks with had a popping sensation Left abdomen or beltline area, he had a sudden pain and it was bothersome, and now has persistent issue bothering Left testicular pain with straining bowel movements   He has had prior R inguinal hernia repaired before  Denies constipation or straining      03/09/2022    1:49 PM 10/25/2021    2:08 PM 10/16/2021    9:36 AM  Depression screen PHQ 2/9  Decreased Interest 1 0 1  Down, Depressed, Hopeless '2 1 1  '$ PHQ - 2 Score '3 1 2  '$ Altered sleeping '2 3 1  '$ Tired, decreased energy '1 3 1  '$ Change in appetite 1 1 0  Feeling bad or failure about yourself  0 0 0  Trouble concentrating 0 0 1  Moving slowly or fidgety/restless 1 0 0  Suicidal thoughts 0 0 0  PHQ-9 Score '8 8 5  '$ Difficult doing work/chores Not difficult at all Not difficult at all Not difficult at all    Social History   Tobacco Use   Smoking status: Every Day    Packs/day: 1.00    Years: 8.00    Total pack years: 8.00    Types: Cigarettes   Smokeless tobacco: Never  Vaping Use   Vaping Use: Never used  Substance Use Topics   Alcohol use: Yes    Alcohol/week: 2.0 standard drinks of alcohol    Types: 2 Standard drinks or equivalent per week    Comment: Drinks mixed drinks every evening.no etoh SINCE 11AM YESTERDAY   Drug use: Not Currently    Review of Systems Per HPI unless specifically indicated above     Objective:    BP (!) 151/100   Pulse 87   Ht 6' (1.829 m)   Wt 208 lb 6.4 oz (94.5 kg)   SpO2 92%   BMI 28.26 kg/m   Wt Readings from Last 3 Encounters:  03/09/22 208 lb  6.4 oz (94.5 kg)  03/05/22 218 lb 0.6 oz (98.9 kg)  11/03/21 218 lb (98.9 kg)    Physical Exam Vitals and nursing note reviewed.  Constitutional:      General: He is not in acute distress.    Appearance: Normal appearance. He is well-developed. He is not diaphoretic.     Comments: Well-appearing, comfortable, cooperative  HENT:     Head: Normocephalic and atraumatic.  Eyes:     General:        Right eye: No discharge.        Left eye: No discharge.     Conjunctiva/sclera: Conjunctivae normal.  Cardiovascular:     Rate and Rhythm: Normal rate.  Pulmonary:     Effort: Pulmonary effort is normal.  Abdominal:     Hernia: A hernia (Possible left lower abdominal hernia however not truly identifying herniation appears to be area of weakness with significant movement reproduced by sitting up and pain localized with movement.) is present.  Genitourinary:    Comments: No inguinal hernia palpated on exam with valsalva provocation Skin:  General: Skin is warm and dry.     Findings: No erythema or rash.  Neurological:     Mental Status: He is alert and oriented to person, place, and time.  Psychiatric:        Mood and Affect: Mood normal.        Behavior: Behavior normal.        Thought Content: Thought content normal.     Comments: Well groomed, good eye contact, normal speech and thoughts     I have personally reviewed the radiology report from 03/05/22 CT Abd Pelvis.  CLINICAL DATA:  Left lower quadrant pain   EXAM: CT ABDOMEN AND PELVIS WITH CONTRAST   TECHNIQUE: Multidetector CT imaging of the abdomen and pelvis was performed using the standard protocol following bolus administration of intravenous contrast.   RADIATION DOSE REDUCTION: This exam was performed according to the departmental dose-optimization program which includes automated exposure control, adjustment of the mA and/or kV according to patient size and/or use of iterative reconstruction technique.    CONTRAST:  115m OMNIPAQUE IOHEXOL 300 MG/ML  SOLN   COMPARISON:  None Available.   FINDINGS: Lower chest: No acute abnormality.   Hepatobiliary: Small focus of ill-defined enhancement in the right hepatic lobe (series 2, image 34). Liver is otherwise unremarkable. Gallbladder is contracted. No biliary dilatation.   Pancreas: Unremarkable. No pancreatic ductal dilatation or surrounding inflammatory changes.   Spleen: Unremarkable.   Adrenals/Urinary Tract: Right adrenal is unremarkable. Stable 3 cm left adrenal nodule. Previously measured density consistent with adenoma. Kidneys are unremarkable. Mild bladder wall thickening.   Stomach/Bowel: Stomach is within normal limits. Bowel is normal in caliber. Normal appendix. Colonic diverticulosis.   Vascular/Lymphatic: Atherosclerosis.  No enlarged nodes.   Reproductive: Unremarkable.   Other: No free fluid.  Abdominal wall is unremarkable.   Musculoskeletal: No acute osseous abnormality.   IMPRESSION: No acute abnormality.   Colonic diverticulosis.   Small focus of enhancement within the right hepatic lobe is indeterminate and may reflect a flash filling hemangioma. This can be evaluated as an outpatient with MRI.     Electronically Signed   By: PMacy MisM.D.   On: 03/05/2022 18:30  Results for orders placed or performed during the hospital encounter of 03/05/22  CBC with Differential  Result Value Ref Range   WBC 7.5 4.0 - 10.5 K/uL   RBC 4.32 4.22 - 5.81 MIL/uL   Hemoglobin 15.1 13.0 - 17.0 g/dL   HCT 44.3 39.0 - 52.0 %   MCV 102.5 (H) 80.0 - 100.0 fL   MCH 35.0 (H) 26.0 - 34.0 pg   MCHC 34.1 30.0 - 36.0 g/dL   RDW 12.9 11.5 - 15.5 %   Platelets 217 150 - 400 K/uL   nRBC 0.0 0.0 - 0.2 %   Neutrophils Relative % 70 %   Neutro Abs 5.2 1.7 - 7.7 K/uL   Lymphocytes Relative 16 %   Lymphs Abs 1.2 0.7 - 4.0 K/uL   Monocytes Relative 11 %   Monocytes Absolute 0.8 0.1 - 1.0 K/uL   Eosinophils Relative 2 %    Eosinophils Absolute 0.2 0.0 - 0.5 K/uL   Basophils Relative 1 %   Basophils Absolute 0.1 0.0 - 0.1 K/uL   Immature Granulocytes 0 %   Abs Immature Granulocytes 0.03 0.00 - 0.07 K/uL  Comprehensive metabolic panel  Result Value Ref Range   Sodium 137 135 - 145 mmol/L   Potassium 3.7 3.5 - 5.1 mmol/L   Chloride  104 98 - 111 mmol/L   CO2 24 22 - 32 mmol/L   Glucose, Bld 174 (H) 70 - 99 mg/dL   BUN 11 6 - 20 mg/dL   Creatinine, Ser 1.00 0.61 - 1.24 mg/dL   Calcium 8.6 (L) 8.9 - 10.3 mg/dL   Total Protein 6.3 (L) 6.5 - 8.1 g/dL   Albumin 3.9 3.5 - 5.0 g/dL   AST 25 15 - 41 U/L   ALT 26 0 - 44 U/L   Alkaline Phosphatase 53 38 - 126 U/L   Total Bilirubin 0.9 0.3 - 1.2 mg/dL   GFR, Estimated >60 >60 mL/min   Anion gap 9 5 - 15  Lipase, blood  Result Value Ref Range   Lipase 57 (H) 11 - 51 U/L  Urinalysis, Routine w reflex microscopic  Result Value Ref Range   Color, Urine STRAW (A) YELLOW   APPearance CLEAR (A) CLEAR   Specific Gravity, Urine 1.008 1.005 - 1.030   pH 7.0 5.0 - 8.0   Glucose, UA NEGATIVE NEGATIVE mg/dL   Hgb urine dipstick NEGATIVE NEGATIVE   Bilirubin Urine NEGATIVE NEGATIVE   Ketones, ur NEGATIVE NEGATIVE mg/dL   Protein, ur NEGATIVE NEGATIVE mg/dL   Nitrite NEGATIVE NEGATIVE   Leukocytes,Ua NEGATIVE NEGATIVE      Assessment & Plan:   Problem List Items Addressed This Visit   None Visit Diagnoses     Spigelian hernia    -  Primary   Relevant Orders   Ambulatory referral to General Surgery   Left lower quadrant abdominal pain       Relevant Orders   Ambulatory referral to General Surgery       new developing / worsening Left abdominal lower lateral possible Spigelian hernia, not having inguinal symptoms. He had R sided inguinal hernia before. Exam appears to be quite reproducible with palpable bulging uncertain if actual herniation through, CT scan from ED non diagnostic for this. Requesting another opinion   AVS given with general hernia  precautions advice Return criteria if develops acute hernia that is non reducible  Orders Placed This Encounter  Procedures   Ambulatory referral to General Surgery    Referral Priority:   Routine    Referral Type:   Surgical    Referral Reason:   Specialty Services Required    Requested Specialty:   General Surgery    Number of Visits Requested:   1     No orders of the defined types were placed in this encounter.     Follow up plan: Return if symptoms worsen or fail to improve.  Nobie Putnam, [facility] 03/09/2022, 1:41 PM

## 2022-03-14 ENCOUNTER — Encounter: Payer: Self-pay | Admitting: Surgery

## 2022-03-14 ENCOUNTER — Ambulatory Visit: Payer: Medicare Other | Admitting: Surgery

## 2022-03-14 VITALS — BP 150/95 | HR 83 | Temp 98.7°F | Ht 72.0 in | Wt 213.0 lb

## 2022-03-14 DIAGNOSIS — K439 Ventral hernia without obstruction or gangrene: Secondary | ICD-10-CM | POA: Diagnosis not present

## 2022-03-14 DIAGNOSIS — K4090 Unilateral inguinal hernia, without obstruction or gangrene, not specified as recurrent: Secondary | ICD-10-CM | POA: Diagnosis not present

## 2022-03-14 NOTE — Patient Instructions (Addendum)
Our surgery scheduler Barbara will call you within 24-48 hours to get you scheduled. If you have not heard from her after 48 hours, please call our office. Have the blue sheet available when she calls to write down important information.   If you have any concerns or questions, please feel free to call our office.   Hernia, Adult     A hernia happens when an organ or tissue inside your body pushes out through a weak spot in the muscles of your belly (abdomen). This makes a bulge. The bulge may be: In a scar from a surgery that was done in your belly (incisional hernia). Near your belly button (umbilical hernia). In your groin (inguinal hernia). Your groin is the area where your leg meets your lower belly. If you are a male, this type could also be in your scrotum. In your upper thigh (femoral hernia). Inside your belly (hiatal hernia). This happens when your stomach slides above the muscle between your belly and your chest (diaphragm). What are the causes? This condition may be caused by: Lifting heavy things. Coughing over a long period of time. Having trouble pooping (constipation). Trouble pooping can lead to straining. A cut from surgery in your belly. A physical problem that is present at birth. Being very overweight. Smoking. Too much fluid in your belly. A testicle that has not moved down into the scrotum, in males. What are the signs or symptoms? The main symptom is a bulge in the area of the hernia, but a bulge may not always be seen. It may grow bigger or be easier to see when you cough or strain (such as when lifting something heavy). A hernia that can be pushed back into the belly rarely causes pain. A hernia that cannot be pushed back into the belly may lose its blood supply. This may cause: Pain. Fever. A feeling like you may vomit, and vomiting. Swelling. Trouble pooping. How is this treated? A hernia that is small and painless may not need to be treated. A hernia  that is large or painful may be treated with surgery. Surgery to treat a hernia involves pushing the bulge back into place and repairing the weak area of the muscle or belly. Follow these instructions at home: Activity Avoid straining the muscles near your hernia. This can happen when you: Lift something heavy. Poop (have a bowel movement). Do not lift anything that is heavier than 10 lb (4.5 kg), or the limit that you are told. When you lift something heavy, use your leg muscles. Do not use your back muscles to lift. Prevent trouble pooping If told by your doctor, take steps to prevent trouble pooping. You may need to: Drink enough fluid to keep your pee (urine) pale yellow. Take medicines. You will be told what medicines to take. Eat foods that are high in fiber. These include beans, whole grains, and fresh fruits and vegetables. Limit foods that are high in fat and sugar. These include fried or sweet foods. General instructions When you cough, try to cough gently. You may try to push your hernia back in by gently pressing on it when you are lying down. Do not try to force the bulge back in if it will not go in easily. If you are overweight, work with your doctor to lose weight safely. Do not smoke or use any products that contain nicotine or tobacco. If you need help quitting, ask your doctor. If you will be having surgery, watch your hernia   for changes in shape, size, or color. Tell your doctor if you see any changes. Take over-the-counter and prescription medicines only as told by your doctor. Keep all follow-up visits. Contact a doctor if: You get new pain, swelling, or redness near your hernia. You poop fewer times in a week than normal. You have trouble pooping. You have poop that is more dry than normal. You have poop that is harder or larger than normal. Get help right away if: You have a fever or chills. You have belly pain that gets worse. You feel like you may vomit, or  you vomit. Your hernia cannot be pushed in by gently pressing on it when you are lying down. Your hernia: Changes in shape or size. Changes color. Feels hard, or it hurts when you touch it. These symptoms may be an emergency. Get help right away. Call your local emergency services (911 in the [place] Do not wait to see if the symptoms will go away. Do not drive yourself to the hospital. Summary A hernia happens when an organ or tissue inside your body pushes out through a weak spot in the belly muscles. This creates a bulge. If your hernia is small and it does not hurt, you may not need treatment. If your hernia is large or it hurts, you may need surgery. If you will be having surgery, watch your hernia for changes in shape, size, or color. Tell your doctor about any changes. This information is not intended to replace advice given to you by your health care provider. Make sure you discuss any questions you have with your health care provider. Document Revised: 03/21/2020 Document Reviewed: 03/21/2020 Elsevier Patient Education  2023 [employer]  

## 2022-03-15 ENCOUNTER — Encounter: Payer: Self-pay | Admitting: Surgery

## 2022-03-15 ENCOUNTER — Telehealth: Payer: Self-pay

## 2022-03-15 ENCOUNTER — Telehealth: Payer: Self-pay | Admitting: Surgery

## 2022-03-15 NOTE — H&P (View-Only) (Signed)
03/14/2022  Reason for Visit:  Left lower quadrant/inguinal pain  Requesting Provider:  Alexander Karamalegos, DO  History of Present Illness: Tyler Martinez is a 50 y.o. male presenting for evaluation of left lower quadrant/inguinal pain.  The patient reports this has been ongoing for a few weeks.  He actually presented to the ED on 03/05/22 and has labs and CT scan while in the waiting area, but left before being seen by a provider.  Labs were overall unremarkable except for mildly elevated lipase of 57.  He did have a CT scan of abdomen and pelvis, which per report there were no acute abnormalities, he did have colonic diverticulosis and an area in his right liver lobe possible hemangioma.    Overall, the patient reports discomfort/pain that has been in the left lower quadrant and groin area.  He thinks that it could possibly go further back to a year, though more noticeable the last few weeks.  Sometimes the pain is more in the upper groin, sometimes he feels it radiates towards his left thigh.  He also reports that sometimes he has discomfort around the umbilicus after eating and can feel bloated.  The discomfort sometimes can be more aggravated if he has to strain for a bowel movements.  Denies any voiding difficulties.  Denies any fevers, chills, chest pain, shortness of breath.  He does heavy lifting at work.  He does have a history of MI and CVA and is currently on Plavix.  He also has diabetes, HTN, HLP.  He has had a prior open right inguinal hernia repair in 2008.  Past Medical History: Past Medical History:  Diagnosis Date   Anxiety    Arthritis    Cardiac arrest (HCC) 2008   Coronary artery disease    Diabetes mellitus without complication (HCC)    Heart attack (HCC)    Hyperlipidemia    Hypertension    MI (myocardial infarction) (HCC)    Stroke (HCC)      Past Surgical History: Past Surgical History:  Procedure Laterality Date   CAROTID STENT  2008   COLONOSCOPY WITH  PROPOFOL N/A 11/03/2021   Procedure: COLONOSCOPY WITH PROPOFOL;  Surgeon: Vanga, Rohini Reddy, MD;  Location: [facility] ENDOSCOPY;  Service: Gastroenterology;  Laterality: N/A;  OK'D PER PM   ESOPHAGOGASTRODUODENOSCOPY (EGD) WITH PROPOFOL N/A 11/03/2021   Procedure: ESOPHAGOGASTRODUODENOSCOPY (EGD) WITH PROPOFOL;  Surgeon: Vanga, Rohini Reddy, MD;  Location: [facility] ENDOSCOPY;  Service: Gastroenterology;  Laterality: N/A;   EXPLORATION POST OPERATIVE OPEN HEART  2008   HERNIA REPAIR  2008   inguinal   LEFT HEART CATH AND CORONARY ANGIOGRAPHY N/A 05/19/2021   Procedure: LEFT HEART CATH AND CORONARY ANGIOGRAPHY;  Surgeon: Arida, Muhammad A, MD;  Location: [facility] INVASIVE CV [facility];  Service: Cardiovascular;  Laterality: N/A;    Home Medications: Prior to Admission medications   Medication Sig Start Date End Date Taking? Authorizing Provider  amLODipine (NORVASC) 10 MG tablet TAKE 1 TABLET BY MOUTH EVERY DAY 01/26/22  Yes Karamalegos, Alexander J, DO  Blood Glucose Monitoring Suppl (ONETOUCH VERIO REFLECT) w/Device KIT USE TO CHECK BLOOD SUGAR UP TO TWICE A DAY. 12/01/21  Yes Karamalegos, Alexander J, DO  carvedilol (COREG) 12.5 MG tablet TAKE 1 TABLET (12.5MG TOTAL) BY MOUTH TWICE A DAY WITH MEALS 03/05/22  Yes Karamalegos, Alexander J, DO  clopidogrel (PLAVIX) 75 MG tablet TAKE 1 TABLET BY MOUTH EVERY DAY 09/21/21  Yes Karamalegos, Alexander J, DO  folic acid (FOLVITE) 1 MG tablet Take 1 tablet (1   mg total) by mouth daily. 08/07/21  Yes Karamalegos, Alexander J, DO  gabapentin (NEURONTIN) 800 MG tablet TAKE 1 TABLET BY MOUTH FOUR TIMES A DAY 01/12/22  Yes Karamalegos, Alexander J, DO  isosorbide mononitrate (IMDUR) 30 MG 24 hr tablet Take 1 tablet (30 mg total) by mouth daily. 05/25/21  Yes Furth, Cadence H, PA-C  Lancets (ONETOUCH ULTRASOFT) lancets Use to check blood sugar up to twice a day. 10/25/21  Yes Karamalegos, Alexander J, DO  lisinopril (ZESTRIL) 40 MG tablet TAKE 1 TABLET BY MOUTH EVERY DAY 01/26/22  Yes  Karamalegos, Alexander J, DO  metFORMIN (GLUCOPHAGE) 1000 MG tablet TAKE 1 TABLET BY MOUTH TWICE A DAY WITH MEALS 02/15/22  Yes Karamalegos, Alexander J, DO  ONETOUCH VERIO test strip Use to check blood sugar up to twice a day. 10/25/21  Yes Karamalegos, Alexander J, DO  sodium chloride 1 g tablet TAKE 1 TABLET (1 G TOTAL) BY MOUTH 2 (TWO) TIMES DAILY WITH A MEAL. 03/02/22  Yes Karamalegos, Alexander J, DO  traMADol (ULTRAM) 50 MG tablet Take 1 tablet (50 mg total) by mouth every 8 (eight) hours as needed. 02/08/22  Yes Karamalegos, Alexander J, DO  ezetimibe (ZETIA) 10 MG tablet Take 1 tablet (10 mg total) by mouth daily. 05/21/21 06/26/21  Sreenath, Sudheer B, MD  rosuvastatin (CRESTOR) 20 MG tablet Take 1 tablet (20 mg total) by mouth at bedtime. 05/20/21 06/26/21  Sreenath, Sudheer B, MD    Allergies: Allergies  Allergen Reactions   Leflunomide Nausea And Vomiting   Methotrexate Derivatives Nausea And Vomiting    Have RA but can not tolerate any  Of the med  No components found with this name: [id number] saw Rheumatology in 2005   Other Nausea And Vomiting    Have RA but can not tolerate any  Of the med  No components found with this name: [id number] saw Rheumatology in 2005    Social History:  reports that he has been smoking cigarettes. He has a 8.00 pack-year smoking history. He has never used smokeless tobacco. He reports current alcohol use of about 2.0 standard drinks of alcohol per week. He reports that he does not currently use drugs.   Family History: Family History  Problem Relation Age of Onset   Cancer Mother    Hyperlipidemia Father    Hypertension Father    Heart attack Father 50   Diabetes Father    Hypertension Brother     Review of Systems: Review of Systems  Constitutional:  Negative for chills and fever.  HENT:  Negative for hearing loss.   Respiratory:  Negative for shortness of breath.   Cardiovascular:  Negative for chest pain.  Gastrointestinal:  Positive  for abdominal pain. Negative for nausea and vomiting.  Genitourinary:  Negative for dysuria.  Musculoskeletal:  Negative for myalgias.  Skin:  Negative for rash.  Neurological:  Negative for dizziness.  Psychiatric/Behavioral:  Negative for depression.     Physical Exam BP (!) 150/95   Pulse 83   Temp 98.7 F (37.1 C) (Oral)   Ht 6' (1.829 m)   Wt 213 lb (96.6 kg)   SpO2 91%   BMI 28.89 kg/m  CONSTITUTIONAL: No acute distress, well nourished. HEENT:  Normocephalic, atraumatic, extraocular motion intact. NECK: Trachea is midline, and there is no jugular venous distension.  RESPIRATORY:  Lungs are clear, and breath sounds are equal bilaterally. Normal respiratory effort without pathologic use of accessory muscles. CARDIOVASCULAR: Heart is regular without murmurs, gallops, or rubs.   GI: The abdomen is soft, non-distended, currently non-tender to palpation.  The patient does have a left inguinal hernia and he also has a supraumbilical hernia, both likely containing fat only.  No hernia palpable in the right groin.  MUSCULOSKELETAL:  Normal muscle strength and tone in all four extremities.  No peripheral edema or cyanosis. SKIN: Skin turgor is normal. There are no pathologic skin lesions.  NEUROLOGIC:  Motor and sensation is grossly normal.  Cranial nerves are grossly intact. PSYCH:  Alert and oriented to person, place and time. Affect is normal.  Laboratory Analysis: Labs from 03/05/22: Na 137, K 3.7, Cl 104, CO2 24, BUN 11, Cr 1.00.  Total bili 0.9, AST 25, ALT 26, Alk Phos 53, lipase 57, albumin 3.9.  WBC 7.5, Hgb 15.1, Hct 44.3, Plt 217  Imaging: CT abdomen/pelvis on 03/05/22: IMPRESSION: No acute abnormality.   Colonic diverticulosis.   Small focus of enhancement within the right hepatic lobe is indeterminate and may reflect a flash filling hemangioma. This can be evaluated as an outpatient with MRI.  Assessment and Plan: This is a 50 y.o. male with a left inguinal hernia and  a supraumbilical hernia.  --On personal view/review of the patient's CT scan from 03/05/22, I would disagree with the read in that there is a left inguinal hernia which contains fat, as well as a small supraumbilical hernia that also contains fat.  This would be consistent with the patient's physical exam on today's evaluation.  This would also explain some of the discomfort in the lower abdomen/groin area and how it can sometimes radiate to the upper inner thigh based on on the distribution of the ilioinguinal nerves. --Discussed with him the plan for surgical repair of these hernias in order to help with his symptoms.  He's in agreement.  Surgical plan would be that of a robotic assisted left inguinal hernia repair with open supraumbilical hernia repair.  Discussed with him the surgery at length and reviewed the risks of bleeding, infection, injury to surrounding structures, the incisions used, the post-operative activity restrictions, pain control, and he's willing to proceed. --Will schedule patient for surgery on 03/22/21.  Will send for medical clearance to Dr. Karamalegos.  Today would be his last dose of Plavix if we can proceed with surgery. --All of his questions have been answered.  I spent 80 minutes dedicated to the care of this patient on the date of this encounter to include pre-visit review of records, face-to-face time with the patient discussing diagnosis and management, and any post-visit coordination of care.   Nixon Sparr Luis Harumi Yamin, MD [facility]    

## 2022-03-15 NOTE — Telephone Encounter (Signed)
Outgoing call, unable to leave message, voice mailbox not set up.  If patient calls, please inform him of the following regarding scheduled surgery:   Surgery Date: 03/22/22 Preadmission Testing Date: 03/19/22 (phone 8a-1p)  Also patient to call at [phone], between 1-3:00pm the day before surgery, to find out what time to arrive for surgery.

## 2022-03-15 NOTE — Telephone Encounter (Signed)
Faxed medical clearance to Dr. Parks Ranger at [phone].

## 2022-03-15 NOTE — Progress Notes (Signed)
03/14/2022  Reason for Visit:  Left lower quadrant/inguinal pain  Requesting Provider:  Nobie Putnam, DO  History of Present Illness: Tyler Martinez is a 51 y.o. male presenting for evaluation of left lower quadrant/inguinal pain.  The patient reports this has been ongoing for a few weeks.  He actually presented to the ED on 03/05/22 and has labs and CT scan while in the waiting area, but left before being seen by a provider.  Labs were overall unremarkable except for mildly elevated lipase of 57.  He did have a CT scan of abdomen and pelvis, which per report there were no acute abnormalities, he did have colonic diverticulosis and an area in his right liver lobe possible hemangioma.    Overall, the patient reports discomfort/pain that has been in the left lower quadrant and groin area.  He thinks that it could possibly go further back to a year, though more noticeable the last few weeks.  Sometimes the pain is more in the upper groin, sometimes he feels it radiates towards his left thigh.  He also reports that sometimes he has discomfort around the umbilicus after eating and can feel bloated.  The discomfort sometimes can be more aggravated if he has to strain for a bowel movements.  Denies any voiding difficulties.  Denies any fevers, chills, chest pain, shortness of breath.  He does heavy lifting at work.  He does have a history of MI and CVA and is currently on Plavix.  He also has diabetes, HTN, HLP.  He has had a prior open right inguinal hernia repair in 2008.  Past Medical History: Past Medical History:  Diagnosis Date   Anxiety    Arthritis    Cardiac arrest [facility]) 2008   Coronary artery disease    Diabetes mellitus without complication ([place])    Heart attack ([place])    Hyperlipidemia    Hypertension    MI (myocardial infarction) ([place])    Stroke [facility] [place])      Past Surgical History: Past Surgical History:  Procedure Laterality Date   CAROTID STENT  2008   COLONOSCOPY WITH  PROPOFOL N/A 11/03/2021   Procedure: COLONOSCOPY WITH PROPOFOL;  Surgeon: Lin Landsman, MD;  Location: [facility] ENDOSCOPY;  Service: Gastroenterology;  Laterality: N/A;  OK'D PER PM   ESOPHAGOGASTRODUODENOSCOPY (EGD) WITH PROPOFOL N/A 11/03/2021   Procedure: ESOPHAGOGASTRODUODENOSCOPY (EGD) WITH PROPOFOL;  Surgeon: Lin Landsman, MD;  Location: [facility];  Service: Gastroenterology;  Laterality: N/A;   EXPLORATION POST OPERATIVE OPEN HEART  2008   HERNIA REPAIR  2008   inguinal   LEFT HEART CATH AND CORONARY ANGIOGRAPHY N/A 05/19/2021   Procedure: LEFT HEART CATH AND CORONARY ANGIOGRAPHY;  Surgeon: Wellington Hampshire, MD;  Location: [facility];  Service: Cardiovascular;  Laterality: N/A;    Home Medications: Prior to Admission medications   Medication Sig Start Date End Date Taking? Authorizing Provider  amLODipine (NORVASC) 10 MG tablet TAKE 1 TABLET BY MOUTH EVERY DAY 01/26/22  Yes Karamalegos, Devonne Doughty, DO  Blood Glucose Monitoring Suppl (ONETOUCH VERIO REFLECT) w/Device KIT USE TO CHECK BLOOD SUGAR UP TO TWICE A DAY. 12/01/21  Yes Karamalegos, Alexander J, DO  carvedilol (COREG) 12.5 MG tablet TAKE 1 TABLET (12.5MG TOTAL) BY MOUTH TWICE A DAY WITH MEALS 03/05/22  Yes Karamalegos, Devonne Doughty, DO  clopidogrel (PLAVIX) 75 MG tablet TAKE 1 TABLET BY MOUTH EVERY DAY 09/21/21  Yes Karamalegos, Devonne Doughty, DO  folic acid (FOLVITE) 1 MG tablet Take 1 tablet (1  mg total) by mouth daily. 08/07/21  Yes Karamalegos, Devonne Doughty, DO  gabapentin (NEURONTIN) 800 MG tablet TAKE 1 TABLET BY MOUTH FOUR TIMES A DAY 01/12/22  Yes Karamalegos, Devonne Doughty, DO  isosorbide mononitrate (IMDUR) 30 MG 24 hr tablet Take 1 tablet (30 mg total) by mouth daily. 05/25/21  Yes Furth, Cadence H, PA-C  Lancets (ONETOUCH ULTRASOFT) lancets Use to check blood sugar up to twice a day. 10/25/21  Yes Karamalegos, Alexander J, DO  lisinopril (ZESTRIL) 40 MG tablet TAKE 1 TABLET BY MOUTH EVERY DAY 01/26/22  Yes  Karamalegos, Devonne Doughty, DO  metFORMIN (GLUCOPHAGE) 1000 MG tablet TAKE 1 TABLET BY MOUTH TWICE A DAY WITH MEALS 02/15/22  Yes Karamalegos, Devonne Doughty, DO  ONETOUCH VERIO test strip Use to check blood sugar up to twice a day. 10/25/21  Yes Karamalegos, Alexander J, DO  sodium chloride 1 g tablet TAKE 1 TABLET (1 G TOTAL) BY MOUTH 2 (TWO) TIMES DAILY WITH A MEAL. 03/02/22  Yes Karamalegos, Devonne Doughty, DO  traMADol (ULTRAM) 50 MG tablet Take 1 tablet (50 mg total) by mouth every 8 (eight) hours as needed. 02/08/22  Yes Karamalegos, Devonne Doughty, DO  ezetimibe (ZETIA) 10 MG tablet Take 1 tablet (10 mg total) by mouth daily. 05/21/21 06/26/21  Sidney Ace, MD  rosuvastatin (CRESTOR) 20 MG tablet Take 1 tablet (20 mg total) by mouth at bedtime. 05/20/21 06/26/21  Sidney Ace, MD    Allergies: Allergies  Allergen Reactions   Leflunomide Nausea And Vomiting   Methotrexate Derivatives Nausea And Vomiting    Have RA but can not tolerate any  Of the med  No components found with this name: [id number] saw Rheumatology in 2005   Other Nausea And Vomiting    Have RA but can not tolerate any  Of the med  No components found with this name: [id number] saw Rheumatology in 2005    Social History:  reports that he has been smoking cigarettes. He has a 8.00 pack-year smoking history. He has never used smokeless tobacco. He reports current alcohol use of about 2.0 standard drinks of alcohol per week. He reports that he does not currently use drugs.   Family History: Family History  Problem Relation Age of Onset   Cancer Mother    Hyperlipidemia Father    Hypertension Father    Heart attack Father 62   Diabetes Father    Hypertension Brother     Review of Systems: Review of Systems  Constitutional:  Negative for chills and fever.  HENT:  Negative for hearing loss.   Respiratory:  Negative for shortness of breath.   Cardiovascular:  Negative for chest pain.  Gastrointestinal:  Positive  for abdominal pain. Negative for nausea and vomiting.  Genitourinary:  Negative for dysuria.  Musculoskeletal:  Negative for myalgias.  Skin:  Negative for rash.  Neurological:  Negative for dizziness.  Psychiatric/Behavioral:  Negative for depression.     Physical Exam BP (!) 150/95   Pulse 83   Temp 98.7 F (37.1 C) (Oral)   Ht 6' (1.829 m)   Wt 213 lb (96.6 kg)   SpO2 91%   BMI 28.89 kg/m  CONSTITUTIONAL: No acute distress, well nourished. HEENT:  Normocephalic, atraumatic, extraocular motion intact. NECK: Trachea is midline, and there is no jugular venous distension.  RESPIRATORY:  Lungs are clear, and breath sounds are equal bilaterally. Normal respiratory effort without pathologic use of accessory muscles. CARDIOVASCULAR: Heart is regular without murmurs, gallops, or rubs.  GI: The abdomen is soft, non-distended, currently non-tender to palpation.  The patient does have a left inguinal hernia and he also has a supraumbilical hernia, both likely containing fat only.  No hernia palpable in the right groin.  MUSCULOSKELETAL:  Normal muscle strength and tone in all four extremities.  No peripheral edema or cyanosis. SKIN: Skin turgor is normal. There are no pathologic skin lesions.  NEUROLOGIC:  Motor and sensation is grossly normal.  Cranial nerves are grossly intact. PSYCH:  Alert and oriented to person, place and time. Affect is normal.  Laboratory Analysis: Labs from 03/05/22: Na 137, K 3.7, Cl 104, CO2 24, BUN 11, Cr 1.00.  Total bili 0.9, AST 25, ALT 26, Alk Phos 53, lipase 57, albumin 3.9.  WBC 7.5, Hgb 15.1, Hct 44.3, Plt 217  Imaging: CT abdomen/pelvis on 03/05/22: IMPRESSION: No acute abnormality.   Colonic diverticulosis.   Small focus of enhancement within the right hepatic lobe is indeterminate and may reflect a flash filling hemangioma. This can be evaluated as an outpatient with MRI.  Assessment and Plan: This is a 51 y.o. male with a left inguinal hernia and  a supraumbilical hernia.  --On personal view/review of the patient's CT scan from 03/05/22, I would disagree with the read in that there is a left inguinal hernia which contains fat, as well as a small supraumbilical hernia that also contains fat.  This would be consistent with the patient's physical exam on today's evaluation.  This would also explain some of the discomfort in the lower abdomen/groin area and how it can sometimes radiate to the upper inner thigh based on on the distribution of the ilioinguinal nerves. --Discussed with him the plan for surgical repair of these hernias in order to help with his symptoms.  He's in agreement.  Surgical plan would be that of a robotic assisted left inguinal hernia repair with open supraumbilical hernia repair.  Discussed with him the surgery at length and reviewed the risks of bleeding, infection, injury to surrounding structures, the incisions used, the post-operative activity restrictions, pain control, and he's willing to proceed. --Will schedule patient for surgery on 03/22/21.  Will send for medical clearance to Dr. Parks Ranger.  Today would be his last dose of Plavix if we can proceed with surgery. --All of his questions have been answered.  I spent 80 minutes dedicated to the care of this patient on the date of this encounter to include pre-visit review of records, face-to-face time with the patient discussing diagnosis and management, and any post-visit coordination of care.   Melvyn Neth, [facility]

## 2022-03-15 NOTE — Telephone Encounter (Signed)
Patient calls back, he is informed of all dates regarding his surgery and verbalized understanding. 

## 2022-03-19 ENCOUNTER — Encounter
Admission: RE | Admit: 2022-03-19 | Discharge: 2022-03-19 | Disposition: A | Discharge status: Home / Self Care | Payer: Medicare Other | Source: Ambulatory Visit | Attending: Surgery | Admitting: Surgery

## 2022-03-19 ENCOUNTER — Other Ambulatory Visit: Payer: Self-pay | Admitting: Family Medicine

## 2022-03-19 ENCOUNTER — Encounter: Payer: Self-pay | Admitting: Surgery

## 2022-03-19 DIAGNOSIS — I10 Essential (primary) hypertension: Secondary | ICD-10-CM

## 2022-03-19 DIAGNOSIS — E119 Type 2 diabetes mellitus without complications: Secondary | ICD-10-CM

## 2022-03-19 DIAGNOSIS — Z01818 Encounter for other preprocedural examination: Secondary | ICD-10-CM

## 2022-03-19 DIAGNOSIS — F1910 Other psychoactive substance abuse, uncomplicated: Secondary | ICD-10-CM

## 2022-03-19 HISTORY — DX: Personal history of Methicillin resistant Staphylococcus aureus infection: Z86.14

## 2022-03-19 HISTORY — DX: Tobacco use: Z72.0

## 2022-03-19 HISTORY — DX: Gastritis, unspecified, without bleeding: K29.70

## 2022-03-19 HISTORY — DX: Rheumatoid arthritis, unspecified: M06.9

## 2022-03-19 HISTORY — DX: Alcohol dependence, uncomplicated: F10.20

## 2022-03-19 NOTE — Patient Instructions (Signed)
Your procedure is scheduled on:03-22-22 Thursday Report to the Registration Desk on the 1st floor of the [facility].Then proceed to the 2nd floor Surgery Desk To find out your arrival time, please call [phone] between 1PM - 3PM on:03-21-22 Wednesday If your arrival time is 6:00 am, do not arrive prior to that time as the [facility] entrance doors do not open until 6:00 am.  REMEMBER: Instructions that are not followed completely may result in serious medical risk, up to and including death; or upon the discretion of your surgeon and anesthesiologist your surgery may need to be rescheduled.  Do not eat food after midnight the night before surgery.  No gum chewing, lozengers or hard candies.  You may however, drink Water up to 2 hours before you are scheduled to arrive for your surgery. Do not drink anything within 2 hours of your scheduled arrival time.  TAKE THESE MEDICATIONS THE MORNING OF SURGERY WITH A SIP OF WATER: -carvedilol (COREG)  -gabapentin (NEURONTIN)   Stop your metFORMIN (GLUCOPHAGE) 2 days prior to surgery-Last dose today 03-19-22 Monday  Last dose of clopidogrel (PLAVIX) was on 03-13-22 as instructed by Dr Hampton Abbot  One week prior to surgery: Stop Anti-inflammatories (NSAIDS) such as Advil, Aleve, Ibuprofen, Motrin, Naproxen, Naprosyn and Aspirin based products such as Excedrin, Goodys Powder, BC Powder.You may however, take Tylenol if needed for pain up until the day of surgery.  Stop ANY OVER THE COUNTER supplements/vitamins NOW (03-19-22) until after surgery.  No Alcohol for 24 hours before or after surgery.  No Smoking including e-cigarettes for 24 hours prior to surgery.  No chewable tobacco products for at least 6 hours prior to surgery.  No nicotine patches on the day of surgery.  Do not use any "recreational" drugs for at least a week prior to your surgery.  Please be advised that the combination of cocaine and anesthesia may have negative outcomes, up to  and including death. If you test positive for cocaine, your surgery will be cancelled.  On the morning of surgery brush your teeth with toothpaste and water, you may rinse your mouth with mouthwash if you wish. Do not swallow any toothpaste or mouthwash.  Use CHG Soap as directed on instruction sheet.  Do not wear jewelry, make-up, hairpins, clips or nail polish.  Do not wear lotions, powders, or perfumes.   Do not shave body from the neck down 48 hours prior to surgery just in case you cut yourself which could leave a site for infection.  Also, freshly shaved skin may become irritated if using the CHG soap.  Contact lenses, hearing aids and dentures may not be worn into surgery.  Do not bring valuables to the hospital. [facility] is not responsible for any missing/lost belongings or valuables.  Notify your doctor if there is any change in your medical condition (cold, fever, infection).  Wear comfortable clothing (specific to your surgery type) to the hospital.  After surgery, you can help prevent lung complications by doing breathing exercises.  Take deep breaths and cough every 1-2 hours. Your doctor may order a device called an Incentive Spirometer to help you take deep breaths. When coughing or sneezing, hold a pillow firmly against your incision with both hands. This is called "splinting." Doing this helps protect your incision. It also decreases belly discomfort.  If you are being admitted to the hospital overnight, leave your suitcase in the car. After surgery it may be brought to your room.  If you are  being discharged the day of surgery, you will not be allowed to drive home. You will need a responsible adult (18 years or older) to drive you home and stay with you that night.   If you are taking public transportation, you will need to have a responsible adult (18 years or older) with you. Please confirm with your physician that it is acceptable to use public  transportation.   Please call the [facility]. at [phone] if you have any questions about these instructions.  Surgery Visitation Policy:  Patients undergoing a surgery or procedure may have two family members or support persons with them as long as the person is not COVID-19 positive or experiencing its symptoms.
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
## 2022-03-20 ENCOUNTER — Encounter
Admission: RE | Admit: 2022-03-20 | Discharge: 2022-03-20 | Disposition: A | Discharge status: Home / Self Care | Payer: Medicare Other | Source: Ambulatory Visit | Attending: Surgery | Admitting: Surgery

## 2022-03-20 DIAGNOSIS — E119 Type 2 diabetes mellitus without complications: Secondary | ICD-10-CM | POA: Diagnosis not present

## 2022-03-20 DIAGNOSIS — Z01818 Encounter for other preprocedural examination: Secondary | ICD-10-CM | POA: Diagnosis not present

## 2022-03-20 DIAGNOSIS — F1290 Cannabis use, unspecified, uncomplicated: Secondary | ICD-10-CM | POA: Diagnosis not present

## 2022-03-20 DIAGNOSIS — F1910 Other psychoactive substance abuse, uncomplicated: Secondary | ICD-10-CM | POA: Insufficient documentation

## 2022-03-20 LAB — URINE DRUG SCREEN, QUALITATIVE (ARMC ONLY)
Amphetamines, Ur Screen: NOT DETECTED
Barbiturates, Ur Screen: NOT DETECTED
Benzodiazepine, Ur Scrn: NOT DETECTED
Cannabinoid 50 Ng, Ur ~~LOC~~: POSITIVE — AB
Cocaine Metabolite,Ur ~~LOC~~: NOT DETECTED
MDMA (Ecstasy)Ur Screen: NOT DETECTED
Methadone Scn, Ur: NOT DETECTED
Opiate, Ur Screen: NOT DETECTED
Phencyclidine (PCP) Ur S: NOT DETECTED
Tricyclic, Ur Screen: NOT DETECTED

## 2022-03-20 LAB — HEMOGLOBIN A1C
Hgb A1c MFr Bld: 6.7 % — ABNORMAL HIGH (ref 4.8–5.6)
Mean Plasma Glucose: 145.59 mg/dL

## 2022-03-20 NOTE — Telephone Encounter (Signed)
Requested Prescriptions  Pending Prescriptions Disp Refills  . lisinopril (ZESTRIL) 40 MG tablet [Pharmacy Med Name: LISINOPRIL 40 MG TABLET] 90 tablet 0    Sig: TAKE 1 TABLET BY MOUTH EVERY DAY     Cardiovascular:  ACE Inhibitors Failed - 03/19/2022 10:11 AM      Failed - Last BP in normal range    BP Readings from Last 1 Encounters:  03/14/22 (!) 150/95         Passed - Cr in normal range and within 180 days    Creat  Date Value Ref Range Status  08/07/2021 0.84 0.70 - 1.30 mg/dL Final   Creatinine, Ser  Date Value Ref Range Status  03/05/2022 1.00 0.61 - 1.24 mg/dL Final         Passed - K in normal range and within 180 days    Potassium  Date Value Ref Range Status  03/05/2022 3.7 3.5 - 5.1 mmol/L Final         Passed - Patient is not pregnant      Passed - Valid encounter within last 6 months    Recent Outpatient Visits          1 week ago Spigelian hernia   [place], DO   5 months ago Abdominal bloating with cramps   [place], DO   7 months ago Hyponatremia   [place], DO   7 months ago Diarrhea, unspecified type   [facility] [place], Coralie Keens, NP   9 months ago Plantar fasciitis of right foot   [place], Devonne Doughty, [place]

## 2022-03-21 ENCOUNTER — Encounter: Payer: Self-pay | Admitting: Surgery

## 2022-03-21 NOTE — Progress Notes (Unsigned)
Medical Clearance has been received from Dr Parks Ranger. The patient is cleared at Medium risk for surgery and may hold his Plavix for 5-7 days prior to surgery and restart 1 day after surgery.

## 2022-03-22 ENCOUNTER — Encounter: Payer: Self-pay | Admitting: Surgery

## 2022-03-22 ENCOUNTER — Encounter
Admission: RE | Disposition: A | Discharge status: Home / Self Care | Payer: Self-pay | Source: Home / Self Care | Attending: Surgery

## 2022-03-22 ENCOUNTER — Ambulatory Visit
Admission: RE | Admit: 2022-03-22 | Discharge: 2022-03-22 | Disposition: A | Discharge status: Home / Self Care | Payer: Medicare Other | Attending: Surgery | Admitting: Surgery

## 2022-03-22 ENCOUNTER — Other Ambulatory Visit: Payer: Self-pay

## 2022-03-22 ENCOUNTER — Ambulatory Visit: Payer: Medicare Other | Admitting: Urgent Care

## 2022-03-22 DIAGNOSIS — K420 Umbilical hernia with obstruction, without gangrene: Secondary | ICD-10-CM | POA: Diagnosis not present

## 2022-03-22 DIAGNOSIS — I10 Essential (primary) hypertension: Secondary | ICD-10-CM | POA: Insufficient documentation

## 2022-03-22 DIAGNOSIS — I252 Old myocardial infarction: Secondary | ICD-10-CM | POA: Diagnosis not present

## 2022-03-22 DIAGNOSIS — Z8673 Personal history of transient ischemic attack (TIA), and cerebral infarction without residual deficits: Secondary | ICD-10-CM | POA: Diagnosis not present

## 2022-03-22 DIAGNOSIS — I2510 Atherosclerotic heart disease of native coronary artery without angina pectoris: Secondary | ICD-10-CM | POA: Insufficient documentation

## 2022-03-22 DIAGNOSIS — Z7902 Long term (current) use of antithrombotics/antiplatelets: Secondary | ICD-10-CM | POA: Diagnosis not present

## 2022-03-22 DIAGNOSIS — K439 Ventral hernia without obstruction or gangrene: Secondary | ICD-10-CM

## 2022-03-22 DIAGNOSIS — K4090 Unilateral inguinal hernia, without obstruction or gangrene, not specified as recurrent: Secondary | ICD-10-CM | POA: Diagnosis not present

## 2022-03-22 DIAGNOSIS — E119 Type 2 diabetes mellitus without complications: Secondary | ICD-10-CM | POA: Insufficient documentation

## 2022-03-22 DIAGNOSIS — F17200 Nicotine dependence, unspecified, uncomplicated: Secondary | ICD-10-CM | POA: Insufficient documentation

## 2022-03-22 DIAGNOSIS — Z01818 Encounter for other preprocedural examination: Secondary | ICD-10-CM

## 2022-03-22 DIAGNOSIS — E785 Hyperlipidemia, unspecified: Secondary | ICD-10-CM | POA: Insufficient documentation

## 2022-03-22 DIAGNOSIS — Z8674 Personal history of sudden cardiac arrest: Secondary | ICD-10-CM | POA: Diagnosis not present

## 2022-03-22 HISTORY — DX: Long term (current) use of antithrombotics/antiplatelets: Z79.02

## 2022-03-22 HISTORY — DX: Bilateral inguinal hernia, without obstruction or gangrene, not specified as recurrent: K40.20

## 2022-03-22 HISTORY — DX: Umbilical hernia without obstruction or gangrene: K42.9

## 2022-03-22 HISTORY — DX: Other ill-defined heart diseases: I51.89

## 2022-03-22 HISTORY — DX: Benign neoplasm of left adrenal gland: D35.02

## 2022-03-22 HISTORY — DX: Type 2 diabetes mellitus without complications: E11.9

## 2022-03-22 HISTORY — DX: Male erectile dysfunction, unspecified: N52.9

## 2022-03-22 HISTORY — DX: Attention-deficit hyperactivity disorder, unspecified type: F90.9

## 2022-03-22 HISTORY — DX: Angina pectoris, unspecified: I20.9

## 2022-03-22 HISTORY — PX: SUPRA-UMBILICAL HERNIA: SHX6105

## 2022-03-22 HISTORY — DX: Diverticulosis of intestine, part unspecified, without perforation or abscess without bleeding: K57.90

## 2022-03-22 HISTORY — DX: Other psychoactive substance abuse, uncomplicated: F19.10

## 2022-03-22 LAB — GLUCOSE, CAPILLARY
Glucose-Capillary: 187 mg/dL — ABNORMAL HIGH (ref 70–99)
Glucose-Capillary: 193 mg/dL — ABNORMAL HIGH (ref 70–99)

## 2022-03-22 SURGERY — HERNIORRHAPHY, INGUINAL, ROBOT-ASSISTED, LAPAROSCOPIC
Anesthesia: General | Site: Inguinal

## 2022-03-22 MED ORDER — FAMOTIDINE 20 MG PO TABS
ORAL_TABLET | ORAL | Status: AC
  Filled 2022-03-22: qty 1

## 2022-03-22 MED ORDER — CHLORHEXIDINE GLUCONATE 0.12 % MT SOLN
OROMUCOSAL | Status: AC
  Filled 2022-03-22: qty 15

## 2022-03-22 MED ORDER — OXYCODONE HCL 5 MG PO TABS: 5.0000 mg | ORAL_TABLET | ORAL | 0 refills | Status: DC | PRN

## 2022-03-22 MED ORDER — ONDANSETRON HCL 4 MG/2ML IJ SOLN: 4.0000 mg | Freq: Once | INTRAMUSCULAR | Status: DC | PRN

## 2022-03-22 MED ORDER — FENTANYL CITRATE (PF) 100 MCG/2ML IJ SOLN
25.0000 ug | INTRAMUSCULAR | Status: DC | PRN
  Administered 2022-03-22: 50 ug via INTRAVENOUS

## 2022-03-22 MED ORDER — OXYCODONE HCL 5 MG PO TABS
5.0000 mg | ORAL_TABLET | Freq: Once | ORAL | Status: AC | PRN
  Administered 2022-03-22: 5 mg via ORAL

## 2022-03-22 MED ORDER — PROPOFOL 10 MG/ML IV BOLUS
INTRAVENOUS | Status: DC | PRN
  Administered 2022-03-22: 200 mg via INTRAVENOUS

## 2022-03-22 MED ORDER — LIDOCAINE HCL (CARDIAC) PF 100 MG/5ML IV SOSY
PREFILLED_SYRINGE | INTRAVENOUS | Status: DC | PRN
  Administered 2022-03-22: 100 mg via INTRAVENOUS

## 2022-03-22 MED ORDER — ACETAMINOPHEN 500 MG PO TABS
1000.0000 mg | ORAL_TABLET | ORAL | Status: AC
  Administered 2022-03-22: 1000 mg via ORAL

## 2022-03-22 MED ORDER — PHENYLEPHRINE 80 MCG/ML (10ML) SYRINGE FOR IV PUSH (FOR BLOOD PRESSURE SUPPORT)
PREFILLED_SYRINGE | INTRAVENOUS | Status: DC | PRN
  Administered 2022-03-22: 80 ug via INTRAVENOUS
  Administered 2022-03-22 (×3): 160 ug via INTRAVENOUS

## 2022-03-22 MED ORDER — CHLORHEXIDINE GLUCONATE 0.12 % MT SOLN
15.0000 mL | Freq: Once | OROMUCOSAL | Status: AC
  Administered 2022-03-22: 15 mL via OROMUCOSAL

## 2022-03-22 MED ORDER — HYDROMORPHONE HCL 1 MG/ML IJ SOLN
INTRAMUSCULAR | Status: AC
  Filled 2022-03-22: qty 1

## 2022-03-22 MED ORDER — GABAPENTIN 300 MG PO CAPS: 300.0000 mg | ORAL_CAPSULE | ORAL | Status: DC

## 2022-03-22 MED ORDER — SUGAMMADEX SODIUM 500 MG/5ML IV SOLN
INTRAVENOUS | Status: DC | PRN
  Administered 2022-03-22: 400 mg via INTRAVENOUS

## 2022-03-22 MED ORDER — BUPIVACAINE-EPINEPHRINE (PF) 0.5% -1:200000 IJ SOLN
INTRAMUSCULAR | Status: DC | PRN
  Administered 2022-03-22: 50 mL via SURGICAL_CAVITY

## 2022-03-22 MED ORDER — FENTANYL CITRATE (PF) 100 MCG/2ML IJ SOLN
INTRAMUSCULAR | Status: AC
  Administered 2022-03-22: 50 ug via INTRAVENOUS
  Filled 2022-03-22: qty 2

## 2022-03-22 MED ORDER — ALBUTEROL SULFATE HFA 108 (90 BASE) MCG/ACT IN AERS
INHALATION_SPRAY | RESPIRATORY_TRACT | Status: DC | PRN
  Administered 2022-03-22: 4 via RESPIRATORY_TRACT
  Administered 2022-03-22: 2 via RESPIRATORY_TRACT
  Administered 2022-03-22: 4 via RESPIRATORY_TRACT

## 2022-03-22 MED ORDER — CHLORHEXIDINE GLUCONATE CLOTH 2 % EX PADS: 6.0000 | MEDICATED_PAD | Freq: Once | CUTANEOUS | Status: DC

## 2022-03-22 MED ORDER — ACETAMINOPHEN 500 MG PO TABS: 1000.0000 mg | ORAL_TABLET | Freq: Four times a day (QID) | ORAL | Status: DC | PRN

## 2022-03-22 MED ORDER — HYDROMORPHONE HCL 1 MG/ML IJ SOLN
INTRAMUSCULAR | Status: AC
  Administered 2022-03-22: 0.5 mg via INTRAVENOUS
  Filled 2022-03-22: qty 1

## 2022-03-22 MED ORDER — EPHEDRINE SULFATE (PRESSORS) 50 MG/ML IJ SOLN
INTRAMUSCULAR | Status: DC | PRN
  Administered 2022-03-22 (×2): 5 mg via INTRAVENOUS

## 2022-03-22 MED ORDER — PHENYLEPHRINE HCL-NACL 20-0.9 MG/250ML-% IV SOLN
INTRAVENOUS | Status: DC | PRN
  Administered 2022-03-22: 25 ug/min via INTRAVENOUS

## 2022-03-22 MED ORDER — SODIUM CHLORIDE 0.9 % IV SOLN: INTRAVENOUS | Status: DC

## 2022-03-22 MED ORDER — CEFAZOLIN SODIUM-DEXTROSE 2-4 GM/100ML-% IV SOLN
2.0000 g | INTRAVENOUS | Status: AC
  Administered 2022-03-22: 2 g via INTRAVENOUS

## 2022-03-22 MED ORDER — OXYCODONE HCL 5 MG/5ML PO SOLN: 5.0000 mg | Freq: Once | ORAL | Status: AC | PRN

## 2022-03-22 MED ORDER — DEXMEDETOMIDINE HCL IN NACL 200 MCG/50ML IV SOLN
INTRAVENOUS | Status: DC | PRN
  Administered 2022-03-22 (×2): 8 ug via INTRAVENOUS
  Administered 2022-03-22: 12 ug via INTRAVENOUS

## 2022-03-22 MED ORDER — ROCURONIUM BROMIDE 100 MG/10ML IV SOLN
INTRAVENOUS | Status: DC | PRN
  Administered 2022-03-22 (×3): 50 mg via INTRAVENOUS

## 2022-03-22 MED ORDER — HYDROMORPHONE HCL 1 MG/ML IJ SOLN
0.5000 mg | INTRAMUSCULAR | Status: AC | PRN
  Administered 2022-03-22 (×2): 0.5 mg via INTRAVENOUS

## 2022-03-22 MED ORDER — CEFAZOLIN SODIUM-DEXTROSE 2-4 GM/100ML-% IV SOLN
INTRAVENOUS | Status: AC
  Filled 2022-03-22: qty 100

## 2022-03-22 MED ORDER — FENTANYL CITRATE (PF) 100 MCG/2ML IJ SOLN
INTRAMUSCULAR | Status: DC | PRN
  Administered 2022-03-22 (×2): 50 ug via INTRAVENOUS

## 2022-03-22 MED ORDER — 0.9 % SODIUM CHLORIDE (POUR BTL) OPTIME
TOPICAL | Status: DC | PRN
  Administered 2022-03-22: 500 mL

## 2022-03-22 MED ORDER — ORAL CARE MOUTH RINSE: 15.0000 mL | Freq: Once | OROMUCOSAL | Status: AC

## 2022-03-22 MED ORDER — BUPIVACAINE LIPOSOME 1.3 % IJ SUSP: 20.0000 mL | Freq: Once | INTRAMUSCULAR | Status: DC

## 2022-03-22 MED ORDER — FAMOTIDINE 20 MG PO TABS
20.0000 mg | ORAL_TABLET | Freq: Once | ORAL | Status: AC
  Administered 2022-03-22: 20 mg via ORAL

## 2022-03-22 MED ORDER — BUPIVACAINE-EPINEPHRINE (PF) 0.5% -1:200000 IJ SOLN
INTRAMUSCULAR | Status: AC
  Filled 2022-03-22: qty 30

## 2022-03-22 MED ORDER — GLYCOPYRROLATE 0.2 MG/ML IJ SOLN
INTRAMUSCULAR | Status: DC | PRN
  Administered 2022-03-22 (×2): .2 mg via INTRAVENOUS

## 2022-03-22 MED ORDER — OXYCODONE HCL 5 MG PO TABS
ORAL_TABLET | ORAL | Status: AC
  Filled 2022-03-22: qty 1

## 2022-03-22 MED ORDER — BUPIVACAINE LIPOSOME 1.3 % IJ SUSP
INTRAMUSCULAR | Status: AC
  Filled 2022-03-22: qty 20

## 2022-03-22 MED ORDER — ACETAMINOPHEN 500 MG PO TABS
ORAL_TABLET | ORAL | Status: AC
  Filled 2022-03-22: qty 2

## 2022-03-22 MED ORDER — VASOPRESSIN 20 UNIT/ML IV SOLN
INTRAVENOUS | Status: DC | PRN
  Administered 2022-03-22 (×2): 2 [IU] via INTRAVENOUS

## 2022-03-22 MED ORDER — KETOROLAC TROMETHAMINE 30 MG/ML IJ SOLN
INTRAMUSCULAR | Status: DC | PRN
  Administered 2022-03-22: 30 mg via INTRAVENOUS

## 2022-03-22 MED ORDER — ONDANSETRON HCL 4 MG/2ML IJ SOLN
INTRAMUSCULAR | Status: DC | PRN
  Administered 2022-03-22 (×2): 4 mg via INTRAVENOUS

## 2022-03-22 MED ORDER — IBUPROFEN 600 MG PO TABS: 600.0000 mg | ORAL_TABLET | Freq: Three times a day (TID) | ORAL | 1 refills | Status: DC | PRN

## 2022-03-22 MED ORDER — DEXAMETHASONE SODIUM PHOSPHATE 10 MG/ML IJ SOLN
INTRAMUSCULAR | Status: DC | PRN
  Administered 2022-03-22: 5 mg via INTRAVENOUS

## 2022-03-22 MED ORDER — MIDAZOLAM HCL 2 MG/2ML IJ SOLN
INTRAMUSCULAR | Status: DC | PRN
  Administered 2022-03-22: 2 mg via INTRAVENOUS

## 2022-03-22 SURGICAL SUPPLY — 57 items
CANNULA REDUC XI 12-8 STAPL (CANNULA) ×1
CANNULA REDUCER 12-8 DVNC XI (CANNULA) ×2 IMPLANT
COVER TIP SHEARS 8 DVNC (MISCELLANEOUS) ×2 IMPLANT
COVER TIP SHEARS 8MM DA VINCI (MISCELLANEOUS) ×1
COVER WAND RF STERILE (DRAPES) ×3 IMPLANT
DERMABOND ADVANCED (GAUZE/BANDAGES/DRESSINGS) ×1
DERMABOND ADVANCED .7 DNX12 (GAUZE/BANDAGES/DRESSINGS) ×2 IMPLANT
DRAPE ARM DVNC X/XI (DISPOSABLE) ×6 IMPLANT
DRAPE COLUMN DVNC XI (DISPOSABLE) ×2 IMPLANT
DRAPE DA VINCI XI ARM (DISPOSABLE) ×3
DRAPE DA VINCI XI COLUMN (DISPOSABLE) ×1
ELECT CAUTERY BLADE TIP 2.5 (TIP) ×3
ELECT COATED BLADE 2.86 ST (ELECTRODE) ×1 IMPLANT
ELECT REM PT RETURN 9FT ADLT (ELECTROSURGICAL) ×3
ELECTRODE CAUTERY BLDE TIP 2.5 (TIP) ×2 IMPLANT
ELECTRODE REM PT RTRN 9FT ADLT (ELECTROSURGICAL) ×2 IMPLANT
GLOVE SURG SYN 7.0 (GLOVE) ×6 IMPLANT
GLOVE SURG SYN 7.0 PF PI (GLOVE) ×4 IMPLANT
GLOVE SURG SYN 7.5  E (GLOVE) ×2
GLOVE SURG SYN 7.5 E (GLOVE) ×4 IMPLANT
GLOVE SURG SYN 7.5 PF PI (GLOVE) ×4 IMPLANT
GOWN STRL REUS W/ TWL LRG LVL3 (GOWN DISPOSABLE) ×8 IMPLANT
GOWN STRL REUS W/TWL LRG LVL3 (GOWN DISPOSABLE) ×4
IRRIGATION STRYKERFLOW (MISCELLANEOUS) ×2 IMPLANT
IRRIGATOR STRYKERFLOW (MISCELLANEOUS)
IV NS 1000ML (IV SOLUTION)
IV NS 1000ML BAXH (IV SOLUTION) IMPLANT
KIT PINK PAD W/HEAD ARE REST (MISCELLANEOUS) ×3
KIT PINK PAD W/HEAD ARM REST (MISCELLANEOUS) ×2 IMPLANT
LABEL OR SOLS (LABEL) ×3 IMPLANT
MANIFOLD NEPTUNE II (INSTRUMENTS) ×3 IMPLANT
MESH 3DMAX MID 4X6 LT LRG (Mesh General) ×1 IMPLANT
NDL INSUFFLATION 14GA 120MM (NEEDLE) ×2 IMPLANT
NEEDLE HYPO 22GX1.5 SAFETY (NEEDLE) ×3 IMPLANT
NEEDLE INSUFFLATION 14GA 120MM (NEEDLE) ×3 IMPLANT
OBTURATOR OPTICAL STANDARD 8MM (TROCAR) ×1
OBTURATOR OPTICAL STND 8 DVNC (TROCAR) ×2
OBTURATOR OPTICALSTD 8 DVNC (TROCAR) ×2 IMPLANT
PACK LAP CHOLECYSTECTOMY (MISCELLANEOUS) ×3 IMPLANT
SEAL CANN UNIV 5-8 DVNC XI (MISCELLANEOUS) ×6 IMPLANT
SEAL XI 5MM-8MM UNIVERSAL (MISCELLANEOUS) ×3
SET TUBE SMOKE EVAC HIGH FLOW (TUBING) ×3 IMPLANT
SOLUTION ELECTROLUBE (MISCELLANEOUS) ×3 IMPLANT
SPONGE T-LAP 18X18 ~~LOC~~+RFID (SPONGE) ×2 IMPLANT
STAPLER CANNULA SEAL DVNC XI (STAPLE) ×2 IMPLANT
STAPLER CANNULA SEAL XI (STAPLE) ×1
SUT MNCRL AB 4-0 PS2 18 (SUTURE) ×4 IMPLANT
SUT VIC AB 2-0 SH 27 (SUTURE) ×2
SUT VIC AB 2-0 SH 27XBRD (SUTURE) ×4 IMPLANT
SUT VIC AB 3-0 SH 27 (SUTURE)
SUT VIC AB 3-0 SH 27X BRD (SUTURE) IMPLANT
SUT VICRYL 0 AB UR-6 (SUTURE) ×5 IMPLANT
SUT VLOC 90 S/L VL9 GS22 (SUTURE) ×3 IMPLANT
TAPE TRANSPORE STRL 2 31045 (GAUZE/BANDAGES/DRESSINGS) ×3 IMPLANT
TRAY FOLEY SLVR 16FR LF STAT (SET/KITS/TRAYS/PACK) ×3 IMPLANT
TROCAR BALLN GELPORT 12X130M (ENDOMECHANICALS) ×3 IMPLANT
WATER STERILE IRR 500ML POUR (IV SOLUTION) ×2 IMPLANT

## 2022-03-22 NOTE — Interval H&P Note (Signed)
History and Physical Interval Note:  03/22/2022 8:28 AM  Tyler Martinez  has presented today for surgery, with the diagnosis of Left inguinal hernia, supraumbilical herna 1 cm.  The various methods of treatment have been discussed with the patient and family. After consideration of risks, benefits and other options for treatment, the patient has consented to  Procedure(s): XI ROBOTIC ASSISTED INGUINAL HERNIA (Left) SUPRA-UMBILICAL HERNIA, open (N/A) as a surgical intervention.  The patient's history has been reviewed, patient examined, no change in status, stable for surgery.  I have reviewed the patient's chart and labs.  Questions were answered to the patient's satisfaction.     [name]

## 2022-03-22 NOTE — Op Note (Signed)
Procedure Date:  03/22/2022  Pre-operative Diagnosis:  Left inguinal hernia, supraumbilical hernia  Post-operative Diagnosis: Left direct inguinal hernia, supraumbilical 1 cm reducible hernia.   Procedure: 1.  Robotic assisted Left Inguinal Hernia Repair 2.  Creation of Left Posterior Rectus-Transversalis Fascia Advancment Flap for Coverage of Pelvic Wound (200 cm) 3.  Open supraumbilical hernia repair.  Surgeon:  Melvyn Neth, MD  Anesthesia:  General endotracheal  Estimated Blood Loss:  10 ml  Specimens:  None  Complications:  None  Indications for Procedure:  This is a 51 y.o. male who presents with a left inguinal hernia and a supraumbilical hernia.  The options of surgery versus observation were reviewed with the patient and/or family. The risks of bleeding, abscess or infection, recurrence of symptoms, potential for an open procedure, injury to surrounding structures, and chronic pain were all discussed with the patient and he was willing to proceed.  We have planned this transabdominal procedure with the creation of left peritoneal flap based on the posterior rectus sheath and transversalis fascia in order to fully cover the mesh, creating a natural tisssue barrier for the bowel and peritoneal cavity.  Description of Procedure: The patient was correctly identified in the preoperative area and brought into the operating room.  The patient was placed supine with VTE prophylaxis in place.  Appropriate time-outs were performed.  Anesthesia was induced and the patient was intubated.  Foley catheter was placed.  Appropriate antibiotics were infused.  The abdomen was prepped and draped in a sterile fashion. A supraumbilical incision was made. Cautery was used to dissect down the subcutaneous tissue to the patient's hernia sac.  The sac and protruding preperitoneal fat were resected, revealing a 1 cm hernia defect.  A 12 mm port was introduced and pneumoperitoneum was obtained with  appropriate opening pressures.  A Veress needle was used to start dissecting the peritoneal flap.  Two 8-mm robotic ports were placed in the right and left lateral positions under direct visualization.  A large left Bard 3D Max Mid Mesh, a 2-0 Vicryl, and 2-0 vlock suture were placed through the umbilical port under direct visualization.  The [employer] platform was docked onto the patient, the camera was inserted and targeted, and the instruments were placed under direct visualization.  Both inguinal regions were inspected for hernias and it was confirmed that the patient had a left direct inguinal hernia.  Using electocautery, the peritoneal and posterior rectus tissue flap was created.  The peritoneum on the left side was scored from the median umbilical ligament laterally towards the ASIS.  The flap was mobilized using robotic scissors and the bipolar instruments, creating a plane along the posterior rectus sheath and transversalis fascia down to the pubic tubercle medially. It was then further mobilized laterally across the inguinal canal and femoral vessels and onto the psoas muscle. The inferior epigastric vessels were identified and preserved. This created a posterior rectus and peritoneal flap measuring roughly 17 cm x 12 cm.  The hernia sac and contents were reduced preserving all structures.  A large left Bard 3D Max Mid mesh was placed with good overlap along all the potential hernia defects and secured in place with 2-0 Vicryl along the medial superomedial and superolateral aspects.  Then, the peritoneal flap was advanced over the mesh and carried over to close the defect. A running 2-0 V lock suture was used to approximate the edge of the flap onto the peritoneum.  All needles were removed under direct visualization.  The 8- mm ports were removed under direct visualization and the Hasson trocar was removed.  The hernia defect was closed using 0 vicryl sutures.  Local anesthetic was infused in all  incisions as well as a left ilioinguinal block.  The supraumbilical incision was closed in layers using 3-0 Vicryl and 4-0 Monocryl, and the other incisions were closed with 4-0 Monocryl.  The wounds were cleaned and sealed with DermaBond.  Foley catheter was removed and the patient was emerged from anesthesia and extubated and brought to the recovery room for further management.  The patient tolerated the procedure well and all counts were correct at the end of the case.   Melvyn Neth, MD
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
## 2022-03-22 NOTE — Anesthesia Postprocedure Evaluation (Signed)
Anesthesia Post Note  Patient: Tyler Martinez  Procedure(s) Performed: XI ROBOTIC ASSISTED INGUINAL HERNIA (Left: Inguinal) SUPRA-UMBILICAL HERNIA, open (Abdomen)  Patient location during evaluation: PACU Anesthesia Type: General Level of consciousness: awake and alert Pain management: pain level controlled Vital Signs Assessment: post-procedure vital signs reviewed and stable Respiratory status: spontaneous breathing, nonlabored ventilation, respiratory function stable and patient connected to nasal cannula oxygen Cardiovascular status: blood pressure returned to baseline and stable Postop Assessment: no apparent nausea or vomiting Anesthetic complications: no   No notable events documented.   Last Vitals:  Vitals:   03/22/22 1332 03/22/22 1358  BP: 118/78 125/86  Pulse: 66 62  Resp: 16 16  Temp: 36.6 C 36.7 C  SpO2: 92% 92%    Last Pain:  Vitals:   03/22/22 1358  TempSrc: Temporal  PainSc: 4                  Arita Miss

## 2022-03-22 NOTE — Anesthesia Procedure Notes (Signed)
Procedure Name: Intubation Date/Time: 03/22/2022 8:57 AM  Performed by: Kelton Pillar, CRNAPre-anesthesia Checklist: Patient identified, Emergency Drugs available, Suction available and Patient being monitored Patient Re-evaluated:Patient Re-evaluated prior to induction Oxygen Delivery Method: Circle system utilized Preoxygenation: Pre-oxygenation with 100% oxygen Induction Type: IV induction Ventilation: Mask ventilation without difficulty Laryngoscope Size: McGraph and 3 Grade View: Grade I Tube type: Oral Tube size: 7.0 mm Number of attempts: 1 Airway Equipment and Method: Stylet and Oral airway Placement Confirmation: ETT inserted through vocal cords under direct vision, positive ETCO2, breath sounds checked- equal and bilateral and CO2 detector Secured at: 21 cm Tube secured with: Tape Dental Injury: Teeth and Oropharynx as per pre-operative assessment

## 2022-03-22 NOTE — Transfer of Care (Signed)
Immediate Anesthesia Transfer of Care Note  Patient: Tyler Martinez  Procedure(s) Performed: XI ROBOTIC ASSISTED INGUINAL HERNIA (Left: Inguinal) SUPRA-UMBILICAL HERNIA, open (Abdomen)  Patient Location: PACU  Anesthesia Type:General  Level of Consciousness: awake, drowsy and patient cooperative  Airway & Oxygen Therapy: Patient Spontanous Breathing and Patient connected to face mask oxygen  Post-op Assessment: Report given to RN and Post -op Vital signs reviewed and stable  Post vital signs: Reviewed and stable  Last Vitals:  Vitals Value Taken Time  BP 110/81 03/22/22 1130  Temp 36.7 C 03/22/22 1130  Pulse 62 03/22/22 1135  Resp 20 03/22/22 1135  SpO2 94 % 03/22/22 1135  Vitals shown include unvalidated device data.  Last Pain:  Vitals:   03/22/22 1130  TempSrc:   PainSc: Asleep         Complications: No notable events documented.

## 2022-03-22 NOTE — Discharge Instructions (Signed)

## 2022-03-23 ENCOUNTER — Encounter: Payer: Self-pay | Admitting: Surgery

## 2022-04-05 ENCOUNTER — Encounter: Payer: Medicare Other | Admitting: Physician Assistant

## 2022-04-05 ENCOUNTER — Ambulatory Visit (INDEPENDENT_AMBULATORY_CARE_PROVIDER_SITE_OTHER): Payer: Medicare Other | Admitting: Family Medicine

## 2022-04-05 VITALS — BP 140/85 | HR 87 | Ht 72.0 in | Wt 214.0 lb

## 2022-04-05 DIAGNOSIS — M1712 Unilateral primary osteoarthritis, left knee: Secondary | ICD-10-CM

## 2022-04-05 DIAGNOSIS — M25562 Pain in left knee: Secondary | ICD-10-CM | POA: Diagnosis not present

## 2022-04-05 DIAGNOSIS — J9801 Acute bronchospasm: Secondary | ICD-10-CM | POA: Diagnosis not present

## 2022-04-05 MED ORDER — LIDOCAINE HCL (PF) 1 % IJ SOLN
4.0000 mL | Freq: Once | INTRAMUSCULAR | Status: AC
  Administered 2022-04-05: 4 mL

## 2022-04-05 MED ORDER — ALBUTEROL SULFATE HFA 108 (90 BASE) MCG/ACT IN AERS: 2.0000 | INHALATION_SPRAY | Freq: Four times a day (QID) | RESPIRATORY_TRACT | 2 refills | Status: DC | PRN

## 2022-04-05 MED ORDER — METHYLPREDNISOLONE ACETATE 40 MG/ML IJ SUSP
40.0000 mg | Freq: Once | INTRAMUSCULAR | Status: AC
  Administered 2022-04-05: 40 mg via INTRA_ARTICULAR

## 2022-04-05 NOTE — Patient Instructions (Addendum)
Thank you for coming to the office today.  [facility] [place], [place]  [address] Phone: [phone]  Referral to Ortho for Left knee  You received a Left Knee Joint steroid injection today. - Lidocaine numbing medicine may ease the pain initially for a few hours until it wears off - As discussed, you may experience a "steroid flare" this evening or within 24-48 hours, anytime medicine is injected into an inflamed joint it can cause the pain to get worse temporarily - Everyone responds differently to these injections, it depends on the patient and the severity of the joint problem, it may provide anywhere from days to weeks, to months of relief. Ideal response is >6 months relief - Try to take it easy for next 1-2 days, avoid over activity and strain on joint (limit walking for knee) - Recommend the following:   - For swelling - rest, compression sleeve / ACE wrap, elevation, and ice packs as needed for first few days   - For pain in future may use heating pad or moist heat as needed  Referral has already been placed to Orthopedics, see below.  Recommend brace / support  [facility] [address] [place], [place] [address] Ph: [place]. [address] [place], [place] [address] Ph: [phone] Fax: [phone]  [facility] [address] [place], [place] [address] Open until [place] Phone: [phone] Fax: [phone]  [facility] [place], [place] [address] Ph [phone] Fax [phone] - Lateral shoe wedges as well   Please schedule a Follow-up Appointment to: Return if symptoms worsen or fail to improve.  If you have any other questions or concerns, please feel free to call the office or send a message through [place]. You may also schedule an earlier  appointment if necessary.  Additionally, you may be receiving a survey about your experience at our office within a few days to 1 week by e-mail or mail. We value your feedback.  Nobie Putnam, DO [place]

## 2022-04-05 NOTE — Progress Notes (Signed)
Subjective:    Patient ID: Baruch Lewers, male    DOB: 1970/10/19, 51 y.o.   MRN: [id number]  Mando Blatz is a 51 y.o. male presenting on 04/05/2022 for Knee Pain   HPI  Post-op Hernia repair 2 weeks, 03/22/22 with robotic hernia repair, x 2 hernias with ventral supraumbilical and left inguinal hernia repair  Left Knee Pain, acute on chronic Osteoarthritis Long history of knee pain, OA / DJD, previous ortho years ago told him he may need knee replacement surgery in future by age 68, he is here to follow up on this now, recent flare of pain left knee Previously tried Voltaren without relief Admits balance and instability with pain Taking Gabapentin 800 QID Had some temporary oxycodone from post op, out now, and also was on Tramadol PRN from our office for pain but it was ineffective He cannot take NSAID due to Plavix Not using knee brace      03/09/2022    1:49 PM 10/25/2021    2:08 PM 10/16/2021    9:36 AM  Depression screen PHQ 2/9  Decreased Interest 1 0 1  Down, Depressed, Hopeless '2 1 1  '$ PHQ - 2 Score '3 1 2  '$ Altered sleeping '2 3 1  '$ Tired, decreased energy '1 3 1  '$ Change in appetite 1 1 0  Feeling bad or failure about yourself  0 0 0  Trouble concentrating 0 0 1  Moving slowly or fidgety/restless 1 0 0  Suicidal thoughts 0 0 0  PHQ-9 Score '8 8 5  '$ Difficult doing work/chores Not difficult at all Not difficult at all Not difficult at all    Social History   Tobacco Use   Smoking status: Every Day    Packs/day: 1.00    Years: 8.00    Total pack years: 8.00    Types: Cigarettes   Smokeless tobacco: Never  Vaping Use   Vaping Use: Never used  Substance Use Topics   Alcohol use: Yes    Alcohol/week: 2.0 standard drinks of alcohol    Types: 2 Standard drinks or equivalent per week    Comment: Drinks vodka every evening   Drug use: Not Currently    Types: Amphetamines    Review of Systems Per HPI unless specifically indicated above     Objective:    BP (!)  140/85   Pulse 87   Ht 6' (1.829 m)   Wt 214 lb (97.1 kg)   SpO2 95%   BMI 29.02 kg/m   Wt Readings from Last 3 Encounters:  04/05/22 214 lb (97.1 kg)  03/22/22 212 lb (96.2 kg)  03/14/22 213 lb (96.6 kg)    Physical Exam Vitals and nursing note reviewed.  Constitutional:      General: He is not in acute distress.    Appearance: Normal appearance. He is well-developed. He is not diaphoretic.     Comments: Well-appearing, comfortable, cooperative  HENT:     Head: Normocephalic and atraumatic.  Eyes:     General:        Right eye: No discharge.        Left eye: No discharge.     Conjunctiva/sclera: Conjunctivae normal.  Cardiovascular:     Rate and Rhythm: Normal rate.  Pulmonary:     Effort: Pulmonary effort is normal.  Skin:    General: Skin is warm and dry.     Findings: No erythema or rash.  Neurological:     Mental Status: He is alert and  oriented to person, place, and time.  Psychiatric:        Mood and Affect: Mood normal.        Behavior: Behavior normal.        Thought Content: Thought content normal.     Comments: Well groomed, good eye contact, normal speech and thoughts      ________________________________________________________ PROCEDURE NOTE Date: 04/05/22 Left knee corticosteroid injection Discussed benefits and risks (including pain, bleeding, infection, steroid flare). Verbal consent given by patient. Medication:  1 cc Depo-medrol '40mg'$  and 4 cc Lidocaine 1% without epi Time Out taken  Landmarks identified. Area cleansed with alcohol wipes. Using 21 gauge and 1, 1/2 inch needle, Left knee joint space was injected (with above listed medication) via medial approach cold spray used for superficial anesthetic. Sterile bandage placed. Patient tolerated procedure well , note he did have some oozing bleeding, hemostasis was achieved with direct pressure. Pressure dressing placed.   Results for orders placed or performed during the hospital encounter of  03/22/22  Glucose, capillary  Result Value Ref Range   Glucose-Capillary 187 (H) 70 - 99 mg/dL   Comment 1 Notify RN    Comment 2 Document in Chart   Glucose, capillary  Result Value Ref Range   Glucose-Capillary 193 (H) 70 - 99 mg/dL      Assessment & Plan:   Problem List Items Addressed This Visit   None Visit Diagnoses     Acute pain of left knee    -  Primary   Relevant Medications   methylPREDNISolone acetate (DEPO-MEDROL) injection 40 mg (Start on 04/05/2022 11:00 AM)   lidocaine (PF) (XYLOCAINE) 1 % injection 4 mL (Start on 04/05/2022 11:00 AM)   Other Relevant Orders   Ambulatory referral to Orthopedic Surgery   Primary osteoarthritis of left knee       Relevant Medications   methylPREDNISolone acetate (DEPO-MEDROL) injection 40 mg (Start on 04/05/2022 11:00 AM)   lidocaine (PF) (XYLOCAINE) 1 % injection 4 mL (Start on 04/05/2022 11:00 AM)   Other Relevant Orders   Ambulatory referral to Orthopedic Surgery   Cough due to bronchospasm       Relevant Medications   albuterol (VENTOLIN HFA) 108 (90 Base) MCG/ACT inhaler       Acute on chronic L medial vs generalized Knee pain and swelling without known injury or trauma Known knee OA/DJD. Suspected likely due to underlying osteoarthritis / DJD with known OA/DJD in other joints. Able to bear weight, some instability Question if any hemarthrosis present given bleeding on injection today, but likely just from plavix Prior arthroscopic knee surgery L side  Plan: 1. Proceed with L Knee Corticosteroid injection today, tolerated well 2. Referral to [place] 3. Deferred X-ray today, will do at Ortho 4. Recommend topical Voltaren, cannot do NSAID w/ plavix 5. Knee brace recommended / vs compression support, RICE therapy 6. Limited other options for pain, failed Tramadol PRN, not much else I can order for longer term pain management here as PCP   Orders Placed This Encounter  Procedures   Ambulatory referral to  Orthopedic Surgery    Referral Priority:   Routine    Referral Type:   Surgical    Referral Reason:   Specialty Services Required    Requested Specialty:   Orthopedic Surgery    Number of Visits Requested:   1     Meds ordered this encounter  Medications   methylPREDNISolone acetate (DEPO-MEDROL) injection 40 mg   lidocaine (PF) (XYLOCAINE)  1 % injection 4 mL   albuterol (VENTOLIN HFA) 108 (90 Base) MCG/ACT inhaler    Sig: Inhale 2 puffs into the lungs every 6 (six) hours as needed for wheezing or shortness of breath.    Dispense:  8 g    Refill:  2     Follow up plan: Return if symptoms worsen or fail to improve.    Nobie Putnam, [facility] 04/05/2022, 10:46 AM

## 2022-04-09 ENCOUNTER — Other Ambulatory Visit: Payer: Self-pay | Admitting: Family Medicine

## 2022-04-09 DIAGNOSIS — I10 Essential (primary) hypertension: Secondary | ICD-10-CM

## 2022-04-09 DIAGNOSIS — E1142 Type 2 diabetes mellitus with diabetic polyneuropathy: Secondary | ICD-10-CM

## 2022-04-10 ENCOUNTER — Ambulatory Visit (INDEPENDENT_AMBULATORY_CARE_PROVIDER_SITE_OTHER): Payer: Medicare Other | Admitting: Physician Assistant

## 2022-04-10 ENCOUNTER — Other Ambulatory Visit: Payer: Self-pay

## 2022-04-10 ENCOUNTER — Encounter: Payer: Self-pay | Admitting: Physician Assistant

## 2022-04-10 VITALS — BP 151/101 | HR 80 | Temp 98.4°F | Ht 72.0 in | Wt 205.4 lb

## 2022-04-10 DIAGNOSIS — Z09 Encounter for follow-up examination after completed treatment for conditions other than malignant neoplasm: Secondary | ICD-10-CM

## 2022-04-10 DIAGNOSIS — G4700 Insomnia, unspecified: Secondary | ICD-10-CM | POA: Insufficient documentation

## 2022-04-10 DIAGNOSIS — F419 Anxiety disorder, unspecified: Secondary | ICD-10-CM | POA: Insufficient documentation

## 2022-04-10 DIAGNOSIS — N529 Male erectile dysfunction, unspecified: Secondary | ICD-10-CM | POA: Insufficient documentation

## 2022-04-10 DIAGNOSIS — K4090 Unilateral inguinal hernia, without obstruction or gangrene, not specified as recurrent: Secondary | ICD-10-CM

## 2022-04-10 DIAGNOSIS — K439 Ventral hernia without obstruction or gangrene: Secondary | ICD-10-CM

## 2022-04-10 DIAGNOSIS — M1990 Unspecified osteoarthritis, unspecified site: Secondary | ICD-10-CM | POA: Insufficient documentation

## 2022-04-10 NOTE — Telephone Encounter (Signed)
Requested Prescriptions  Pending Prescriptions Disp Refills  . gabapentin (NEURONTIN) 800 MG tablet [Pharmacy Med Name: GABAPENTIN 800 MG TABLET] 120 tablet 2    Sig: TAKE 1 TABLET BY MOUTH FOUR TIMES A DAY     Neurology: Anticonvulsants - gabapentin Passed - 04/09/2022  2:22 PM      Passed - Cr in normal range and within 360 days    Creat  Date Value Ref Range Status  08/07/2021 0.84 0.70 - 1.30 mg/dL Final   Creatinine, Ser  Date Value Ref Range Status  03/05/2022 1.00 0.61 - 1.24 mg/dL Final         Passed - Completed PHQ-2 or PHQ-9 in the last 360 days      Passed - Valid encounter within last 12 months    Recent Outpatient Visits          5 days ago Acute pain of left knee   [facility] Olin Hauser, DO   1 month ago Spigelian hernia   [place], DO   5 months ago Abdominal bloating with cramps   [place], DO   8 months ago Hyponatremia   [place], DO   8 months ago Diarrhea, unspecified type   [facility] [place] [place] W, NP             . amLODipine ([place]) 10 MG tablet [Pharmacy Med Name: AMLODIPINE BESYLATE 10 MG TAB] 90 tablet 0    Sig: TAKE 1 TABLET BY MOUTH EVERY DAY     Cardiovascular: Calcium Channel Blockers 2 Failed - 04/09/2022  2:22 PM      Failed - Last BP in normal range    BP Readings from Last 1 Encounters:  04/05/22 (!) 140/85         Passed - Last Heart Rate in normal range    Pulse Readings from Last 1 Encounters:  04/05/22 87         Passed - Valid encounter within last 6 months    Recent Outpatient Visits          5 days ago Acute pain of left knee   [place], DO   1 month ago Spigelian hernia   [place], DO   5 months ago Abdominal bloating with cramps   [place], DO   8 months ago Hyponatremia   [place], DO   8 months ago Diarrhea, unspecified type   [facility] [place], Coralie Keens, NP

## 2022-04-10 NOTE — Progress Notes (Signed)
[facility] POST-OP OFFICE VISIT  04/10/2022  HPI: Tyler Martinez is a 51 y.o. male 25 days s/p robotic assisted laparoscopic left inguinal hernia repair and open supraumbilical hernia repair with Dr Hampton Abbot   He is doing well Still with mild "shooting" pains in his left groin and down his medial thigh which is similar to pre-operatively but much improved now Sore at supraumbilical incision No fever, chills, nausea, emesis, or bowel changes No issues with incisions No  further complaints   Vital signs: BP (!) 151/101   Pulse 80   Temp 98.4 F (36.9 C) (Oral)   Ht 6' (1.829 m)   Wt 205 lb 6.4 oz (93.2 kg)   SpO2 92%   BMI 27.86 kg/m    Physical Exam: Constitutional: Well appearing male, NAD Abdomen: Soft, non-tender, non-distended, no rebound/guarding. Left groin is non-tender, no swelling, no evidence of recurrence  Skin: Laparoscopic incisions are healing well, no erythema or drainage   Assessment/Plan: This is a 51 y.o. male 19 days s/p robotic assisted laparoscopic left inguinal hernia repair and open supraumbilical hernia repair with Dr Hampton Abbot    - Pain control prn  - Reviewed wound care recommendation  - Reviewed lifting restrictions; 6 weeks total  - he can follow up on as needed basis; He understands to call with questions/concerns  -- Edison Simon, PA-C [facility] 04/10/2022, 2:22 PM M-F: 7am - 4pm

## 2022-04-10 NOTE — Patient Instructions (Signed)

## 2022-04-17 ENCOUNTER — Emergency Department (HOSPITAL_COMMUNITY): Payer: Medicare Other

## 2022-04-17 ENCOUNTER — Inpatient Hospital Stay (HOSPITAL_COMMUNITY)
Admission: EM | Admit: 2022-04-17 | Discharge: 2022-04-19 | DRG: 872 | Discharge status: Against Medical Advice | Payer: Medicare Other | Attending: Internal Medicine | Admitting: Internal Medicine

## 2022-04-17 DIAGNOSIS — Z5329 Procedure and treatment not carried out because of patient's decision for other reasons: Secondary | ICD-10-CM | POA: Diagnosis not present

## 2022-04-17 DIAGNOSIS — Z888 Allergy status to other drugs, medicaments and biological substances status: Secondary | ICD-10-CM

## 2022-04-17 DIAGNOSIS — Z955 Presence of coronary angioplasty implant and graft: Secondary | ICD-10-CM

## 2022-04-17 DIAGNOSIS — R509 Fever, unspecified: Secondary | ICD-10-CM | POA: Diagnosis not present

## 2022-04-17 DIAGNOSIS — M7989 Other specified soft tissue disorders: Secondary | ICD-10-CM | POA: Diagnosis not present

## 2022-04-17 DIAGNOSIS — Z8614 Personal history of Methicillin resistant Staphylococcus aureus infection: Secondary | ICD-10-CM

## 2022-04-17 DIAGNOSIS — I110 Hypertensive heart disease with heart failure: Secondary | ICD-10-CM | POA: Diagnosis present

## 2022-04-17 DIAGNOSIS — F1021 Alcohol dependence, in remission: Secondary | ICD-10-CM | POA: Diagnosis present

## 2022-04-17 DIAGNOSIS — Z833 Family history of diabetes mellitus: Secondary | ICD-10-CM | POA: Diagnosis not present

## 2022-04-17 DIAGNOSIS — E1142 Type 2 diabetes mellitus with diabetic polyneuropathy: Secondary | ICD-10-CM | POA: Diagnosis present

## 2022-04-17 DIAGNOSIS — I252 Old myocardial infarction: Secondary | ICD-10-CM | POA: Diagnosis not present

## 2022-04-17 DIAGNOSIS — E785 Hyperlipidemia, unspecified: Secondary | ICD-10-CM | POA: Diagnosis present

## 2022-04-17 DIAGNOSIS — Z8249 Family history of ischemic heart disease and other diseases of the circulatory system: Secondary | ICD-10-CM

## 2022-04-17 DIAGNOSIS — Z720 Tobacco use: Secondary | ICD-10-CM | POA: Diagnosis not present

## 2022-04-17 DIAGNOSIS — F1020 Alcohol dependence, uncomplicated: Secondary | ICD-10-CM | POA: Diagnosis present

## 2022-04-17 DIAGNOSIS — I10 Essential (primary) hypertension: Secondary | ICD-10-CM | POA: Diagnosis not present

## 2022-04-17 DIAGNOSIS — L03114 Cellulitis of left upper limb: Secondary | ICD-10-CM | POA: Diagnosis not present

## 2022-04-17 DIAGNOSIS — R7881 Bacteremia: Secondary | ICD-10-CM | POA: Diagnosis not present

## 2022-04-17 DIAGNOSIS — F17200 Nicotine dependence, unspecified, uncomplicated: Secondary | ICD-10-CM | POA: Diagnosis present

## 2022-04-17 DIAGNOSIS — E871 Hypo-osmolality and hyponatremia: Secondary | ICD-10-CM | POA: Diagnosis present

## 2022-04-17 DIAGNOSIS — M069 Rheumatoid arthritis, unspecified: Secondary | ICD-10-CM | POA: Diagnosis present

## 2022-04-17 DIAGNOSIS — A419 Sepsis, unspecified organism: Secondary | ICD-10-CM | POA: Diagnosis not present

## 2022-04-17 DIAGNOSIS — H5462 Unqualified visual loss, left eye, normal vision right eye: Secondary | ICD-10-CM | POA: Diagnosis present

## 2022-04-17 DIAGNOSIS — Z7984 Long term (current) use of oral hypoglycemic drugs: Secondary | ICD-10-CM

## 2022-04-17 DIAGNOSIS — I6930 Unspecified sequelae of cerebral infarction: Secondary | ICD-10-CM

## 2022-04-17 DIAGNOSIS — I2510 Atherosclerotic heart disease of native coronary artery without angina pectoris: Secondary | ICD-10-CM | POA: Diagnosis not present

## 2022-04-17 DIAGNOSIS — A4102 Sepsis due to Methicillin resistant Staphylococcus aureus: Secondary | ICD-10-CM | POA: Diagnosis not present

## 2022-04-17 DIAGNOSIS — Z83438 Family history of other disorder of lipoprotein metabolism and other lipidemia: Secondary | ICD-10-CM

## 2022-04-17 DIAGNOSIS — E1169 Type 2 diabetes mellitus with other specified complication: Secondary | ICD-10-CM | POA: Diagnosis not present

## 2022-04-17 DIAGNOSIS — I5032 Chronic diastolic (congestive) heart failure: Secondary | ICD-10-CM | POA: Diagnosis present

## 2022-04-17 DIAGNOSIS — B9561 Methicillin susceptible Staphylococcus aureus infection as the cause of diseases classified elsewhere: Secondary | ICD-10-CM | POA: Diagnosis not present

## 2022-04-17 DIAGNOSIS — Z8674 Personal history of sudden cardiac arrest: Secondary | ICD-10-CM | POA: Diagnosis not present

## 2022-04-17 DIAGNOSIS — Z7902 Long term (current) use of antithrombotics/antiplatelets: Secondary | ICD-10-CM

## 2022-04-17 DIAGNOSIS — Z79899 Other long term (current) drug therapy: Secondary | ICD-10-CM | POA: Diagnosis not present

## 2022-04-17 DIAGNOSIS — F17210 Nicotine dependence, cigarettes, uncomplicated: Secondary | ICD-10-CM | POA: Diagnosis present

## 2022-04-17 DIAGNOSIS — I25118 Atherosclerotic heart disease of native coronary artery with other forms of angina pectoris: Secondary | ICD-10-CM | POA: Diagnosis not present

## 2022-04-17 DIAGNOSIS — F1029 Alcohol dependence with unspecified alcohol-induced disorder: Secondary | ICD-10-CM | POA: Diagnosis not present

## 2022-04-17 LAB — CBC WITH DIFFERENTIAL/PLATELET
Abs Immature Granulocytes: 0.01 10*3/uL (ref 0.00–0.07)
Basophils Absolute: 0 10*3/uL (ref 0.0–0.1)
Basophils Relative: 1 %
Eosinophils Absolute: 0 10*3/uL (ref 0.0–0.5)
Eosinophils Relative: 1 %
HCT: 44.1 % (ref 39.0–52.0)
Hemoglobin: 16 g/dL (ref 13.0–17.0)
Immature Granulocytes: 0 %
Lymphocytes Relative: 8 %
Lymphs Abs: 0.5 10*3/uL — ABNORMAL LOW (ref 0.7–4.0)
MCH: 36.2 pg — ABNORMAL HIGH (ref 26.0–34.0)
MCHC: 36.3 g/dL — ABNORMAL HIGH (ref 30.0–36.0)
MCV: 99.8 fL (ref 80.0–100.0)
Monocytes Absolute: 0.8 10*3/uL (ref 0.1–1.0)
Monocytes Relative: 14 %
Neutro Abs: 4.2 10*3/uL (ref 1.7–7.7)
Neutrophils Relative %: 76 %
Platelets: 170 10*3/uL (ref 150–400)
RBC: 4.42 MIL/uL (ref 4.22–5.81)
RDW: 13.2 % (ref 11.5–15.5)
WBC: 5.5 10*3/uL (ref 4.0–10.5)
nRBC: 0 % (ref 0.0–0.2)

## 2022-04-17 LAB — COMPREHENSIVE METABOLIC PANEL
ALT: 22 U/L (ref 0–44)
AST: 24 U/L (ref 15–41)
Albumin: 4 g/dL (ref 3.5–5.0)
Alkaline Phosphatase: 73 U/L (ref 38–126)
Anion gap: 14 (ref 5–15)
BUN: 6 mg/dL (ref 6–20)
CO2: 23 mmol/L (ref 22–32)
Calcium: 9.1 mg/dL (ref 8.9–10.3)
Chloride: 92 mmol/L — ABNORMAL LOW (ref 98–111)
Creatinine, Ser: 0.69 mg/dL (ref 0.61–1.24)
GFR, Estimated: >60 mL/min (ref >60)
Glucose, Bld: 170 mg/dL — ABNORMAL HIGH (ref 70–99)
Potassium: 3.7 mmol/L (ref 3.5–5.1)
Sodium: 129 mmol/L — ABNORMAL LOW (ref 135–145)
Total Bilirubin: 0.8 mg/dL (ref 0.3–1.2)
Total Protein: 6.6 g/dL (ref 6.5–8.1)

## 2022-04-17 LAB — LACTIC ACID, PLASMA: Lactic Acid, Venous: 1.2 mmol/L (ref 0.5–1.9)

## 2022-04-17 MED ORDER — ACETAMINOPHEN 325 MG PO TABS
650.0000 mg | ORAL_TABLET | Freq: Once | ORAL | Status: AC
Start: 2022-04-17 — End: 2022-04-17
  Administered 2022-04-17: 650 mg via ORAL
  Filled 2022-04-17: qty 2

## 2022-04-17 NOTE — ED Provider Triage Note (Signed)
Emergency Medicine Provider Triage Evaluation Note  Tydus Sanmiguel , a 51 y.o. male  was evaluated in triage.  Pt complains of left elbow pain, redness, fever onset 2 days ago. Went to UC today, found to have fever and was sent to the ER. Last had APAP yesterday. Review of Systems  Positive: Fever, elbow redness, pain Negative: Joint pain  Physical Exam  BP (!) 163/105 (BP Location: Right Arm)   Pulse (!) 111   Temp (!) 101.4 F (38.6 C) (Oral)   Resp 20   SpO2 95%  Gen:   Awake, no distress   Resp:  Normal effort  MSK:   Moves extremities without difficulty  Other:  Erythema and swelling to left posterior elbow without boggy bursa. Normal ROM left elbow.   Medical Decision Making  Medically screening exam initiated at 8:11 PM.  Appropriate orders placed.  Boris Engelmann was informed that the remainder of the evaluation will be completed by another provider, this initial triage assessment does not replace that evaluation, and the importance of remaining in the ED until their evaluation is complete.     Tacy Learn, PA-C 04/17/22 2012

## 2022-04-17 NOTE — ED Triage Notes (Signed)
Pt w swelling & redness to L elbow x2 days. Endorses associated fever, last dose of tylenol yesterday. Full ROM in L elbow, went to UC today, sent to ED for further follow up.

## 2022-04-18 ENCOUNTER — Inpatient Hospital Stay (HOSPITAL_COMMUNITY): Payer: Medicare Other

## 2022-04-18 DIAGNOSIS — E1169 Type 2 diabetes mellitus with other specified complication: Secondary | ICD-10-CM

## 2022-04-18 DIAGNOSIS — E785 Hyperlipidemia, unspecified: Secondary | ICD-10-CM

## 2022-04-18 DIAGNOSIS — I10 Essential (primary) hypertension: Secondary | ICD-10-CM | POA: Diagnosis not present

## 2022-04-18 DIAGNOSIS — F1029 Alcohol dependence with unspecified alcohol-induced disorder: Secondary | ICD-10-CM

## 2022-04-18 DIAGNOSIS — M069 Rheumatoid arthritis, unspecified: Secondary | ICD-10-CM

## 2022-04-18 DIAGNOSIS — R7881 Bacteremia: Secondary | ICD-10-CM

## 2022-04-18 DIAGNOSIS — E1142 Type 2 diabetes mellitus with diabetic polyneuropathy: Secondary | ICD-10-CM | POA: Diagnosis present

## 2022-04-18 DIAGNOSIS — Z8674 Personal history of sudden cardiac arrest: Secondary | ICD-10-CM | POA: Diagnosis not present

## 2022-04-18 DIAGNOSIS — I6930 Unspecified sequelae of cerebral infarction: Secondary | ICD-10-CM | POA: Diagnosis not present

## 2022-04-18 DIAGNOSIS — A419 Sepsis, unspecified organism: Secondary | ICD-10-CM | POA: Diagnosis present

## 2022-04-18 DIAGNOSIS — Z79899 Other long term (current) drug therapy: Secondary | ICD-10-CM | POA: Diagnosis not present

## 2022-04-18 DIAGNOSIS — I110 Hypertensive heart disease with heart failure: Secondary | ICD-10-CM | POA: Diagnosis present

## 2022-04-18 DIAGNOSIS — L03114 Cellulitis of left upper limb: Secondary | ICD-10-CM | POA: Diagnosis present

## 2022-04-18 DIAGNOSIS — B9561 Methicillin susceptible Staphylococcus aureus infection as the cause of diseases classified elsewhere: Secondary | ICD-10-CM | POA: Diagnosis not present

## 2022-04-18 DIAGNOSIS — I252 Old myocardial infarction: Secondary | ICD-10-CM | POA: Diagnosis not present

## 2022-04-18 DIAGNOSIS — F1020 Alcohol dependence, uncomplicated: Secondary | ICD-10-CM | POA: Diagnosis present

## 2022-04-18 DIAGNOSIS — Z888 Allergy status to other drugs, medicaments and biological substances status: Secondary | ICD-10-CM | POA: Diagnosis not present

## 2022-04-18 DIAGNOSIS — Z83438 Family history of other disorder of lipoprotein metabolism and other lipidemia: Secondary | ICD-10-CM | POA: Diagnosis not present

## 2022-04-18 DIAGNOSIS — F17210 Nicotine dependence, cigarettes, uncomplicated: Secondary | ICD-10-CM | POA: Diagnosis present

## 2022-04-18 DIAGNOSIS — Z8249 Family history of ischemic heart disease and other diseases of the circulatory system: Secondary | ICD-10-CM | POA: Diagnosis not present

## 2022-04-18 DIAGNOSIS — Z720 Tobacco use: Secondary | ICD-10-CM | POA: Diagnosis not present

## 2022-04-18 DIAGNOSIS — Z8614 Personal history of Methicillin resistant Staphylococcus aureus infection: Secondary | ICD-10-CM | POA: Diagnosis not present

## 2022-04-18 DIAGNOSIS — I25118 Atherosclerotic heart disease of native coronary artery with other forms of angina pectoris: Secondary | ICD-10-CM | POA: Diagnosis not present

## 2022-04-18 DIAGNOSIS — Z5329 Procedure and treatment not carried out because of patient's decision for other reasons: Secondary | ICD-10-CM | POA: Diagnosis present

## 2022-04-18 DIAGNOSIS — Z7902 Long term (current) use of antithrombotics/antiplatelets: Secondary | ICD-10-CM | POA: Diagnosis not present

## 2022-04-18 DIAGNOSIS — E871 Hypo-osmolality and hyponatremia: Secondary | ICD-10-CM

## 2022-04-18 DIAGNOSIS — I2510 Atherosclerotic heart disease of native coronary artery without angina pectoris: Secondary | ICD-10-CM | POA: Diagnosis present

## 2022-04-18 DIAGNOSIS — Z955 Presence of coronary angioplasty implant and graft: Secondary | ICD-10-CM | POA: Diagnosis not present

## 2022-04-18 DIAGNOSIS — I5032 Chronic diastolic (congestive) heart failure: Secondary | ICD-10-CM | POA: Diagnosis present

## 2022-04-18 DIAGNOSIS — A4102 Sepsis due to Methicillin resistant Staphylococcus aureus: Secondary | ICD-10-CM | POA: Diagnosis present

## 2022-04-18 DIAGNOSIS — Z833 Family history of diabetes mellitus: Secondary | ICD-10-CM | POA: Diagnosis not present

## 2022-04-18 LAB — CBC WITH DIFFERENTIAL/PLATELET
Abs Immature Granulocytes: 0.03 10*3/uL (ref 0.00–0.07)
Basophils Absolute: 0 10*3/uL (ref 0.0–0.1)
Basophils Relative: 0 %
Eosinophils Absolute: 0 10*3/uL (ref 0.0–0.5)
Eosinophils Relative: 0 %
HCT: 48.7 % (ref 39.0–52.0)
Hemoglobin: 17.4 g/dL — ABNORMAL HIGH (ref 13.0–17.0)
Immature Granulocytes: 0 %
Lymphocytes Relative: 5 %
Lymphs Abs: 0.4 10*3/uL — ABNORMAL LOW (ref 0.7–4.0)
MCH: 35.4 pg — ABNORMAL HIGH (ref 26.0–34.0)
MCHC: 35.7 g/dL (ref 30.0–36.0)
MCV: 99 fL (ref 80.0–100.0)
Monocytes Absolute: 1 10*3/uL (ref 0.1–1.0)
Monocytes Relative: 13 %
Neutro Abs: 6.4 10*3/uL (ref 1.7–7.7)
Neutrophils Relative %: 82 %
Platelets: 169 10*3/uL (ref 150–400)
RBC: 4.92 MIL/uL (ref 4.22–5.81)
RDW: 13.1 % (ref 11.5–15.5)
WBC: 7.8 10*3/uL (ref 4.0–10.5)
nRBC: 0 % (ref 0.0–0.2)

## 2022-04-18 LAB — BLOOD CULTURE ID PANEL (REFLEXED) - BCID2

## 2022-04-18 LAB — ECHOCARDIOGRAM COMPLETE
Area-P 1/2: 4.15 cm2
S' Lateral: 3.1 cm

## 2022-04-18 LAB — C-REACTIVE PROTEIN: CRP: 11.8 mg/dL — ABNORMAL HIGH (ref <1.0)

## 2022-04-18 LAB — LACTIC ACID, PLASMA: Lactic Acid, Venous: 1.4 mmol/L (ref 0.5–1.9)

## 2022-04-18 MED ORDER — HYDROCODONE-ACETAMINOPHEN 5-325 MG PO TABS
1.0000 | ORAL_TABLET | Freq: Four times a day (QID) | ORAL | Status: DC | PRN
  Administered 2022-04-18: 1 via ORAL
  Filled 2022-04-18: qty 1

## 2022-04-18 MED ORDER — MAGNESIUM SULFATE 2 GM/50ML IV SOLN
2.0000 g | Freq: Once | INTRAVENOUS | Status: AC
  Administered 2022-04-18: 2 g via INTRAVENOUS
  Filled 2022-04-18: qty 50

## 2022-04-18 MED ORDER — SODIUM CHLORIDE 0.9% FLUSH
3.0000 mL | Freq: Two times a day (BID) | INTRAVENOUS | Status: DC
Start: 2022-04-18 — End: 2022-04-19
  Administered 2022-04-18 – 2022-04-19 (×3): 3 mL via INTRAVENOUS

## 2022-04-18 MED ORDER — AMLODIPINE BESYLATE 10 MG PO TABS
10.0000 mg | ORAL_TABLET | Freq: Every day | ORAL | Status: DC
  Administered 2022-04-18 – 2022-04-19 (×2): 10 mg via ORAL
  Filled 2022-04-18: qty 2
  Filled 2022-04-18: qty 1

## 2022-04-18 MED ORDER — OXYCODONE HCL 5 MG PO TABS
5.0000 mg | ORAL_TABLET | ORAL | Status: DC | PRN
  Administered 2022-04-18 – 2022-04-19 (×5): 5 mg via ORAL
  Filled 2022-04-18 (×5): qty 1

## 2022-04-18 MED ORDER — NICOTINE 21 MG/24HR TD PT24
21.0000 mg | MEDICATED_PATCH | Freq: Every day | TRANSDERMAL | Status: DC
  Administered 2022-04-18 – 2022-04-19 (×2): 21 mg via TRANSDERMAL
  Filled 2022-04-18 (×2): qty 1

## 2022-04-18 MED ORDER — VANCOMYCIN HCL IN DEXTROSE 1-5 GM/200ML-% IV SOLN
1000.0000 mg | Freq: Once | INTRAVENOUS | Status: AC
  Administered 2022-04-18: 1000 mg via INTRAVENOUS
  Filled 2022-04-18: qty 200

## 2022-04-18 MED ORDER — MORPHINE SULFATE (PF) 2 MG/ML IV SOLN
2.0000 mg | INTRAVENOUS | Status: AC | PRN
  Administered 2022-04-18 (×3): 2 mg via INTRAVENOUS
  Filled 2022-04-18 (×3): qty 1

## 2022-04-18 MED ORDER — CEFAZOLIN SODIUM-DEXTROSE 2-4 GM/100ML-% IV SOLN
2.0000 g | Freq: Three times a day (TID) | INTRAVENOUS | Status: DC
  Administered 2022-04-18 – 2022-04-19 (×3): 2 g via INTRAVENOUS
  Filled 2022-04-18 (×5): qty 100

## 2022-04-18 MED ORDER — ISOSORBIDE MONONITRATE ER 30 MG PO TB24
30.0000 mg | ORAL_TABLET | Freq: Every evening | ORAL | Status: DC
  Administered 2022-04-18: 30 mg via ORAL
  Filled 2022-04-18: qty 1

## 2022-04-18 MED ORDER — ENOXAPARIN SODIUM 40 MG/0.4ML IJ SOSY
40.0000 mg | PREFILLED_SYRINGE | INTRAMUSCULAR | Status: DC
  Administered 2022-04-18 – 2022-04-19 (×2): 40 mg via SUBCUTANEOUS
  Filled 2022-04-18 (×2): qty 0.4

## 2022-04-18 MED ORDER — ALBUTEROL SULFATE (2.5 MG/3ML) 0.083% IN NEBU: 2.5000 mg | INHALATION_SOLUTION | Freq: Four times a day (QID) | RESPIRATORY_TRACT | Status: DC | PRN

## 2022-04-18 MED ORDER — LORAZEPAM 2 MG/ML IJ SOLN
0.0000 mg | Freq: Four times a day (QID) | INTRAMUSCULAR | Status: DC
  Administered 2022-04-18: 1 mg via INTRAVENOUS
  Administered 2022-04-18 – 2022-04-19 (×2): 2 mg via INTRAVENOUS
  Filled 2022-04-18 (×3): qty 1

## 2022-04-18 MED ORDER — FENTANYL CITRATE PF 50 MCG/ML IJ SOSY
50.0000 ug | PREFILLED_SYRINGE | Freq: Once | INTRAMUSCULAR | Status: AC
  Administered 2022-04-18: 50 ug via INTRAVENOUS
  Filled 2022-04-18: qty 1

## 2022-04-18 MED ORDER — ONDANSETRON HCL 4 MG/2ML IJ SOLN: 4.0000 mg | Freq: Four times a day (QID) | INTRAMUSCULAR | Status: DC | PRN

## 2022-04-18 MED ORDER — CARVEDILOL 12.5 MG PO TABS
12.5000 mg | ORAL_TABLET | Freq: Two times a day (BID) | ORAL | Status: DC
  Administered 2022-04-18 – 2022-04-19 (×2): 12.5 mg via ORAL
  Filled 2022-04-18 (×2): qty 1

## 2022-04-18 MED ORDER — FOLIC ACID 1 MG PO TABS
1.0000 mg | ORAL_TABLET | Freq: Every day | ORAL | Status: DC
  Administered 2022-04-18 – 2022-04-19 (×2): 1 mg via ORAL
  Filled 2022-04-18 (×2): qty 1

## 2022-04-18 MED ORDER — LORAZEPAM 2 MG/ML IJ SOLN: 0.0000 mg | Freq: Two times a day (BID) | INTRAMUSCULAR | Status: DC

## 2022-04-18 MED ORDER — THIAMINE HCL 100 MG PO TABS
100.0000 mg | ORAL_TABLET | Freq: Every day | ORAL | Status: DC
  Administered 2022-04-18 – 2022-04-19 (×2): 100 mg via ORAL
  Filled 2022-04-18 (×2): qty 1

## 2022-04-18 MED ORDER — ACETAMINOPHEN 325 MG PO TABS
650.0000 mg | ORAL_TABLET | Freq: Four times a day (QID) | ORAL | Status: DC | PRN
  Administered 2022-04-18 (×2): 650 mg via ORAL
  Filled 2022-04-18 (×2): qty 2

## 2022-04-18 MED ORDER — VANCOMYCIN HCL 1750 MG/350ML IV SOLN
1750.0000 mg | Freq: Two times a day (BID) | INTRAVENOUS | Status: DC
Start: 2022-04-18 — End: 2022-04-18

## 2022-04-18 MED ORDER — SODIUM CHLORIDE 1 G PO TABS
1.0000 g | ORAL_TABLET | Freq: Two times a day (BID) | ORAL | Status: DC
  Administered 2022-04-18 – 2022-04-19 (×2): 1 g via ORAL
  Filled 2022-04-18 (×3): qty 1

## 2022-04-18 MED ORDER — LORAZEPAM 2 MG/ML IJ SOLN: 1.0000 mg | INTRAMUSCULAR | Status: DC | PRN

## 2022-04-18 MED ORDER — OXYCODONE-ACETAMINOPHEN 5-325 MG PO TABS: 1.0000 | ORAL_TABLET | ORAL | Status: DC | PRN

## 2022-04-18 MED ORDER — SIMETHICONE 80 MG PO CHEW: 160.0000 mg | CHEWABLE_TABLET | Freq: Four times a day (QID) | ORAL | Status: DC | PRN

## 2022-04-18 MED ORDER — THIAMINE HCL 100 MG/ML IJ SOLN: 100.0000 mg | Freq: Every day | INTRAMUSCULAR | Status: DC

## 2022-04-18 MED ORDER — VANCOMYCIN HCL IN DEXTROSE 1-5 GM/200ML-% IV SOLN
1000.0000 mg | Freq: Once | INTRAVENOUS | Status: AC
Start: 2022-04-18 — End: 2022-04-18
  Administered 2022-04-18: 1000 mg via INTRAVENOUS
  Filled 2022-04-18: qty 200

## 2022-04-18 MED ORDER — LORAZEPAM 1 MG PO TABS
1.0000 mg | ORAL_TABLET | ORAL | Status: DC | PRN
  Administered 2022-04-18 (×2): 1 mg via ORAL
  Administered 2022-04-18 – 2022-04-19 (×2): 2 mg via ORAL
  Filled 2022-04-18 (×2): qty 1
  Filled 2022-04-18 (×2): qty 2

## 2022-04-18 MED ORDER — SODIUM CHLORIDE 0.9 % IV BOLUS
500.0000 mL | Freq: Once | INTRAVENOUS | Status: AC
  Administered 2022-04-18: 500 mL via INTRAVENOUS

## 2022-04-18 MED ORDER — ACETAMINOPHEN 650 MG RE SUPP: 650.0000 mg | Freq: Four times a day (QID) | RECTAL | Status: DC | PRN

## 2022-04-18 MED ORDER — CLOPIDOGREL BISULFATE 75 MG PO TABS
75.0000 mg | ORAL_TABLET | Freq: Every day | ORAL | Status: DC
  Administered 2022-04-18 – 2022-04-19 (×2): 75 mg via ORAL
  Filled 2022-04-18 (×2): qty 1

## 2022-04-18 MED ORDER — ADULT MULTIVITAMIN W/MINERALS CH
1.0000 | ORAL_TABLET | Freq: Every day | ORAL | Status: DC
  Administered 2022-04-18 – 2022-04-19 (×2): 1 via ORAL
  Filled 2022-04-18 (×2): qty 1

## 2022-04-18 MED ORDER — LISINOPRIL 20 MG PO TABS
40.0000 mg | ORAL_TABLET | Freq: Every day | ORAL | Status: DC
  Administered 2022-04-18 – 2022-04-19 (×2): 40 mg via ORAL
  Filled 2022-04-18 (×2): qty 2

## 2022-04-18 MED ORDER — GABAPENTIN 400 MG PO CAPS
800.0000 mg | ORAL_CAPSULE | Freq: Four times a day (QID) | ORAL | Status: DC
  Administered 2022-04-18 – 2022-04-19 (×5): 800 mg via ORAL
  Filled 2022-04-18 (×5): qty 2

## 2022-04-18 MED ORDER — ONDANSETRON HCL 4 MG PO TABS: 4.0000 mg | ORAL_TABLET | Freq: Four times a day (QID) | ORAL | Status: DC | PRN

## 2022-04-18 MED ORDER — POTASSIUM CHLORIDE CRYS ER 20 MEQ PO TBCR
40.0000 meq | EXTENDED_RELEASE_TABLET | ORAL | Status: AC
  Administered 2022-04-18: 40 meq via ORAL
  Filled 2022-04-18: qty 2

## 2022-04-18 MED ORDER — HYDRALAZINE HCL 20 MG/ML IJ SOLN: 10.0000 mg | INTRAMUSCULAR | Status: DC | PRN

## 2022-04-18 NOTE — ED Notes (Signed)
Echo at BS 
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
## 2022-04-18 NOTE — ED Notes (Signed)
Pt SpO2 89% while pt is resting. Pt placed on 2L via [place].

## 2022-04-18 NOTE — Progress Notes (Signed)
Pharmacy Antibiotic Note  Tyler Martinez is a 51 y.o. male admitted on 04/17/2022 with cellulitis. Pharmacy has been consulted for vancomcyin dosing. Pt with Tmax 1016 and WBC is WNL. Scr is WNL and lactic acid is <2.  Plan: Give additional 1g vancomycin now, then '1750mg'$  IV Q12H  F/u renal fxn, C&S, clinical status and peak/trough at SS    Temp (24hrs), Avg:100.7 F (38.2 C), Min:99.2 F (37.3 C), Max:101.6 F (38.7 C)  Recent Labs  Lab 04/17/22 2034 04/18/22 0700  WBC 5.5 7.8  CREATININE 0.69  --   LATICACIDVEN 1.2  --     Estimated Creatinine Clearance: 130.9 mL/min (by C-G formula based on SCr of 0.69 mg/dL).    Allergies  Allergen Reactions   Leflunomide Nausea And Vomiting   Methotrexate Derivatives Nausea And Vomiting    Have RA but can not tolerate any  Of the med  No components found with this name: [id number] saw Rheumatology in 2005   Other Nausea And Vomiting    Have RA but can not tolerate any  Of the med  No components found with this name: [id number] saw Rheumatology in 2005    Antimicrobials this admission: Vanc 8/23>>  Dose adjustments this admission: N/A  Microbiology results: Pending  Thank you for allowing pharmacy to be a part of this patient's care.  Kiera Hussey, Rande Lawman 04/18/2022 8:14 AM
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
## 2022-04-18 NOTE — Progress Notes (Signed)
PHARMACY - PHYSICIAN COMMUNICATION CRITICAL VALUE ALERT - BLOOD CULTURE IDENTIFICATION (BCID)  Tyler Martinez is an 51 y.o. male who presented to [facility] on 04/17/2022 with a chief complaint of arm pain  Assessment: 67 YOM being treated for L-arm/elbow cellulitis and now with 4/4 bottles growing GPC with BCID detecting MSSA.  Name of physician (or Provider) Contacted: Vu (automatic ID consult)  Current antibiotics: Vancomycin  Changes to prescribed antibiotics recommended:  [place] to Cefazolin 2g IV every 8 hours  Results for orders placed or performed during the hospital encounter of 04/17/22  Blood Culture ID Panel (Reflexed) (Collected: 04/17/2022  8:34 PM)  Result Value Ref Range   Enterococcus faecalis NOT DETECTED NOT DETECTED   Enterococcus Faecium NOT DETECTED NOT DETECTED   Listeria monocytogenes NOT DETECTED NOT DETECTED   Staphylococcus species DETECTED (A) NOT DETECTED   Staphylococcus aureus (BCID) DETECTED (A) NOT DETECTED   Staphylococcus epidermidis NOT DETECTED NOT DETECTED   Staphylococcus lugdunensis NOT DETECTED NOT DETECTED   Streptococcus species NOT DETECTED NOT DETECTED   Streptococcus agalactiae NOT DETECTED NOT DETECTED   Streptococcus pneumoniae NOT DETECTED NOT DETECTED   Streptococcus pyogenes NOT DETECTED NOT DETECTED   A.calcoaceticus-baumannii NOT DETECTED NOT DETECTED   Bacteroides fragilis NOT DETECTED NOT DETECTED   Enterobacterales NOT DETECTED NOT DETECTED   Enterobacter cloacae complex NOT DETECTED NOT DETECTED   Escherichia coli NOT DETECTED NOT DETECTED   Klebsiella aerogenes NOT DETECTED NOT DETECTED   Klebsiella oxytoca NOT DETECTED NOT DETECTED   Klebsiella pneumoniae NOT DETECTED NOT DETECTED   Proteus species NOT DETECTED NOT DETECTED   Salmonella species NOT DETECTED NOT DETECTED   Serratia marcescens NOT DETECTED NOT DETECTED   Haemophilus influenzae NOT DETECTED NOT DETECTED   Neisseria meningitidis NOT DETECTED NOT DETECTED    Pseudomonas aeruginosa NOT DETECTED NOT DETECTED   Stenotrophomonas maltophilia NOT DETECTED NOT DETECTED   Candida albicans NOT DETECTED NOT DETECTED   Candida auris NOT DETECTED NOT DETECTED   Candida glabrata NOT DETECTED NOT DETECTED   Candida krusei NOT DETECTED NOT DETECTED   Candida parapsilosis NOT DETECTED NOT DETECTED   Candida tropicalis NOT DETECTED NOT DETECTED   Cryptococcus neoformans/gattii NOT DETECTED NOT DETECTED   Meth resistant mecA/C and MREJ NOT DETECTED NOT DETECTED    Thank you for allowing pharmacy to be a part of this patient's care.  Alycia Rossetti, PharmD, BCPS Infectious Diseases Clinical Pharmacist 04/18/2022 10:20 AM   **Pharmacist phone directory can now be found on [email or web address] (PW [email or web address]).  Listed under [place].

## 2022-04-18 NOTE — ED Notes (Signed)
Triage RN notified of patient temp
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
## 2022-04-18 NOTE — ED Notes (Signed)
Chlorhexadine scrub to L arm and elbow

## 2022-04-18 NOTE — Progress Notes (Signed)
  Echocardiogram 2D Echocardiogram has been performed.  Eartha Inch 04/18/2022, 2:17 PM

## 2022-04-19 DIAGNOSIS — A419 Sepsis, unspecified organism: Secondary | ICD-10-CM | POA: Diagnosis not present

## 2022-04-19 LAB — BASIC METABOLIC PANEL
Anion gap: 9 (ref 5–15)
BUN: 17 mg/dL (ref 6–20)
CO2: 27 mmol/L (ref 22–32)
Calcium: 8.4 mg/dL — ABNORMAL LOW (ref 8.9–10.3)
Chloride: 95 mmol/L — ABNORMAL LOW (ref 98–111)
Creatinine, Ser: 1.2 mg/dL (ref 0.61–1.24)
GFR, Estimated: >60 mL/min (ref >60)
Glucose, Bld: 182 mg/dL — ABNORMAL HIGH (ref 70–99)
Potassium: 4 mmol/L (ref 3.5–5.1)
Sodium: 131 mmol/L — ABNORMAL LOW (ref 135–145)

## 2022-04-19 LAB — SEDIMENTATION RATE: Sed Rate: 2 mm/hr (ref 0–16)

## 2022-04-19 LAB — CBC
HCT: 44.1 % (ref 39.0–52.0)
Hemoglobin: 15.4 g/dL (ref 13.0–17.0)
MCH: 35.1 pg — ABNORMAL HIGH (ref 26.0–34.0)
MCHC: 34.9 g/dL (ref 30.0–36.0)
MCV: 100.5 fL — ABNORMAL HIGH (ref 80.0–100.0)
Platelets: 150 10*3/uL (ref 150–400)
RBC: 4.39 MIL/uL (ref 4.22–5.81)
RDW: 13.2 % (ref 11.5–15.5)
WBC: 5 10*3/uL (ref 4.0–10.5)
nRBC: 0 % (ref 0.0–0.2)

## 2022-04-19 LAB — FOLATE: Folate: 18.7 ng/mL (ref >5.9)

## 2022-04-19 LAB — VITAMIN B12: Vitamin B-12: 142 pg/mL — ABNORMAL LOW (ref 180–914)

## 2022-04-19 LAB — MAGNESIUM: Magnesium: 1.8 mg/dL (ref 1.7–2.4)

## 2022-04-19 LAB — PHOSPHORUS: Phosphorus: 4.1 mg/dL (ref 2.5–4.6)

## 2022-04-19 MED ORDER — CYANOCOBALAMIN 1000 MCG/ML IJ SOLN
1000.0000 ug | Freq: Every day | INTRAMUSCULAR | Status: DC
Start: 2022-04-19 — End: 2022-04-19
  Filled 2022-04-19: qty 1

## 2022-04-19 NOTE — Progress Notes (Addendum)
CSW received consult for substance use for patient. CSW went to patients room to complete assessment. Patient was not in room. CSW was informed by secretary patient left AMA. CSW unable to complete consult.

## 2022-04-19 NOTE — Progress Notes (Addendum)
Called to room for pt saying that he is leaving the hospital. States he feels like he is better and we are not doing anything for him here. Pt A&O X 4. Pt made aware that the infection he has still requires IV abx and that he has not been medically cleared. Explained to pt risk of not receiving continued abx and treatment may result in loss of limb and death. Pt verbalized understanding, but still insists that he is leaving to go home. Dr Tyrell Antonio notified via telephone. Dr Tyrell Antonio spoke with pt and requests that ID be notified.  Dr Gale Journey with ID notified that pt is signing out [place] and has left the hospital.

## 2022-04-19 NOTE — TOC Initial Note (Signed)
Transition of Care [facility]) - Initial/Assessment Note    Patient Details  Name: Tyler Martinez MRN: [id number] Date of Birth: 12-23-1970  Transition of Care [facility]) CM/SW Contact:    Ninfa Meeker, RN Phone Number: 04/19/2022, 9:29 AM  Clinical Narrative:                 Transition of Care Screening Note: Transition of Care Department [facility]) has reviewed patient and no TOC needs have been identified at this time. We will continue to monitor patient advancement through Interdisciplinary progressions. If new patient transition needs arise, please place a consult.          Patient Goals and CMS Choice        Expected Discharge Plan and Services                                                Prior Living Arrangements/Services                       Activities of Daily Living      Permission Sought/Granted                  Emotional Assessment              Admission diagnosis:  Cellulitis of left forearm [L03.114] Sepsis ([place]) [A41.9] Patient Active Problem List   Diagnosis Date Noted   Sepsis ([place]) 04/18/2022   Cellulitis of left forearm 04/18/2022   MSSA bacteremia    Anxiety 04/10/2022   Arthritis 04/10/2022   ED (erectile dysfunction) 04/10/2022   Insomnia 95/18/8416   Supraumbilical hernia without gangrene and without obstruction    Left inguinal hernia    Gastritis, erosive    Abdominal bloating    Adenomatous polyp of colon    Colon cancer screening    Hypomagnesuria 07/29/2021   Hyponatremia 06/26/2021   Alcohol withdrawal ([place]) 06/26/2021   Polysubstance abuse ([place]) 06/26/2021   Incidental adrenal cortical adenoma 06/26/2021   Abnormal nuclear stress test    Chest pain 05/18/2021   CAD (coronary artery disease) 05/18/2021   Tobacco abuse 05/18/2021   History of substance abuse ([place]) 01/12/2020   Alcohol dependence ([place]) 01/12/2020   Coronary artery disease involving native coronary artery of native heart without  angina pectoris 04/14/2019   Essential hypertension 04/14/2019   Hyperlipidemia associated with type 2 diabetes mellitus ([place]) 04/14/2019   History of cerebrovascular accident (CVA) with residual deficit 04/14/2019   Residual cognitive deficit as late effect of stroke 04/14/2019   Type 2 diabetes mellitus with vascular disease ([place]) 04/14/2019   Diabetic polyneuropathy associated with type 2 diabetes mellitus ([place]) 04/14/2019   Rheumatoid arthritis involving multiple sites ([place]) 04/14/2019   Flat foot 04/14/2019   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   Concentration deficit 07/29/2018   Non compliance w medication regimen 05/22/2018   Laceration of left wrist 10/04/2017   Major depressive disorder, recurrent severe without psychotic features ([place]) 10/04/2017   Other stimulant dependence, uncomplicated ([place]) 60/63/0160   Foot deformity, bilateral 09/17/2017   Attention deficit hyperactivity disorder (ADHD), 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   Bilateral ankle pain 12/21/2015   Short-term memory loss 12/13/2015   Combined hyperlipidemia associated with type 2 diabetes mellitus ([place]) 10/20/2015   Acute respiratory failure ([place]) 12/22/2013   Head trauma 12/22/2013   Anemia 12/21/2013   Chronic  pain 12/21/2013   Leukocytosis 12/21/2013   Chronic patellofemoral pain 08/02/2011   PCP:  Olin Hauser, DO Pharmacy:   [id number] [place] [id number]: [phone]: [phone] [id number] [place] [address]: [phone]: [phone]   Social Determinants of Health (SDOH) Interventions    Readmission Risk Interventions     No data to display
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
## 2022-04-20 LAB — CULTURE, BLOOD (ROUTINE X 2)
Special Requests: ADEQUATE
Special Requests: ADEQUATE

## 2022-04-20 SURGERY — ECHOCARDIOGRAM, TRANSESOPHAGEAL
Anesthesia: Monitor Anesthesia Care

## 2022-04-23 ENCOUNTER — Telehealth: Payer: Self-pay

## 2022-04-23 DIAGNOSIS — L03114 Cellulitis of left upper limb: Secondary | ICD-10-CM

## 2022-04-23 MED ORDER — SULFAMETHOXAZOLE-TRIMETHOPRIM 800-160 MG PO TABS: 1.0000 | ORAL_TABLET | Freq: Two times a day (BID) | ORAL | 0 refills | Status: AC

## 2022-04-23 NOTE — Telephone Encounter (Signed)
I reviewed the chart. I am concerned that he left Against Medical Advice.  I will order Bactrim twice a day for 10 days, please let him know that he should keep apt to follow up as scheduled already and if worsening - needs to go back to hospital ED may need more IV antibiotics if it doesn't respond to oral antibiotic.  Nobie Putnam, [facility] 04/23/2022, 11:35 AM

## 2022-04-23 NOTE — Telephone Encounter (Signed)
Copied from [place] [phone]. Topic: General - Other >> Apr 20, 2022  9:26 AM Cyndi Bender wrote: Reason for CRM: Pt reports that he was admitted to [facility] but he left early so he did not receive any medications. Pt requests that a Rx for an antibiotic [id number] [place] [address]  Phone: [phone] Fax: [phone]

## 2022-04-24 LAB — CULTURE, BLOOD (ROUTINE X 2)
Culture: NO GROWTH
Culture: NO GROWTH
Special Requests: ADEQUATE
Special Requests: ADEQUATE

## 2022-04-25 NOTE — Progress Notes (Unsigned)
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 neonatal or myeloproliferative syndrome specimens for Vitamin B12 levels. Performed at [facility], [place] [address], [place], [place] [address]   Folate     Status: None   Collection Time: 04/19/22  3:50 AM  Result Value Ref Range   Folate 18.7 >5.9 ng/mL    Comment: Performed at [facility], [place] [address], [place], [place] [address]  Phosphorus     Status: None   Collection Time: 04/19/22  3:50 AM  Result Value Ref Range   Phosphorus 4.1 2.5 - 4.6 mg/dL    Comment: Performed at [facility] [address], [place], [place] [address]   Magnesium     Status: None   Collection Time: 04/19/22  3:50 AM  Result Value Ref Range   Magnesium 1.8 1.7 - 2.4 mg/dL    Comment: Performed at [facility] [address] [place], [place] [address]  Culture, blood (Routine X 2) w Reflex to ID Panel     Status: None   Collection Time: 04/19/22  3:51 AM   Specimen: BLOOD  Result Value Ref Range   Specimen Description BLOOD LEFT ANTECUBITAL    Special Requests      BOTTLES DRAWN AEROBIC AND ANAEROBIC Blood Culture adequate volume   Culture      NO GROWTH 5 DAYS Performed at [facility], [place] [address], [place], [place] [address]    Report Status 04/24/2022 FINAL   Culture, blood (Routine X 2) w Reflex to ID Panel     Status: None   Collection Time: 04/19/22  3:52 AM   Specimen: BLOOD  Result Value Ref Range   Specimen Description BLOOD LEFT ANTECUBITAL    Special Requests      BOTTLES DRAWN AEROBIC AND ANAEROBIC Blood Culture adequate volume   Culture      NO GROWTH 5 DAYS Performed at [facility], [place] [address], [place],  [address]    Report Status 04/24/2022 FINAL      PHQ2/9:    03/09/2022    1:49 PM 10/25/2021    2:08 PM 10/16/2021    9:36 AM 06/05/2021    9:57 AM 04/04/2021    1:40 PM  Depression screen PHQ 2/9  Decreased Interest 1 0 _0 Down, Depressed, Hopeless _1 PHQ - 2 Score _2 Altered sleeping _3 0 1  Tired, decreased energy _4 Change in appetite 1 1 0 0 1  Feeling bad or failure about yourself  0 0 0 0 0  Trouble concentrating 0 0 _5 Moving slowly or fidgety/restless 1 0 0 2 1  Suicidal thoughts 0 0 0 0 0  PHQ-9 Score _6 Difficult  doing work/chores Not difficult at all Not difficult at all Not difficult at all Extremely dIfficult Somewhat difficult      Fall Risk:    04/10/2022    2:18 PM 03/09/2022    1:49 PM 10/25/2021    2:12 PM 10/16/2021    9:36 AM 08/07/2021    2:27 PM  [employer] in the past year? 0 0 0 0 0   Number falls in past yr:  0 0 0 0  Injury with Fall?  0 0 0 0  Risk for fall due to :  No Fall Risks No Fall Risks No Fall Risks   Follow up  Falls evaluation completed Falls evaluation completed Falls evaluation completed Falls evaluation completed      Functional Status Survey:      Assessment & Plan  Problem List Items Addressed This Visit       Other   Cellulitis of left forearm - Primary    Acute, ongoing concern Patient was hospitalized for this and sepsis on 04/18/22 but left AMA on 04/19/22 without oral abx PCP has called in bactrim BID for 10 days - reviewed this with patient and he states he will go pick this up and start today PE was reassuring today - cellulitis appears to be improved since hospitalization Recommend he proceed with Bactrim and can use Tylenol as needed for discomfort Reviewed return and ED precautions- patient voiced understanding and agreement Follow up as needed for persistent or progressing symptoms         No follow-ups on file.   I, Jerrel Tiberio E Nathanyal Ashmead, PA-C, have reviewed all documentation for this visit. The documentation on 04/26/22 for the exam, diagnosis, procedures, and orders are all accurate and complete.   Talitha Givens, MHS, PA-C [facility]

## 2022-04-26 ENCOUNTER — Ambulatory Visit (INDEPENDENT_AMBULATORY_CARE_PROVIDER_SITE_OTHER): Payer: Medicare Other | Admitting: Physician Assistant

## 2022-04-26 ENCOUNTER — Encounter: Payer: Self-pay | Admitting: Physician Assistant

## 2022-04-26 VITALS — BP 169/107 | HR 84 | Ht 72.0 in | Wt 200.2 lb

## 2022-04-26 DIAGNOSIS — L03114 Cellulitis of left upper limb: Secondary | ICD-10-CM | POA: Diagnosis not present

## 2022-04-26 NOTE — Assessment & Plan Note (Addendum)
Acute, ongoing concern Patient was hospitalized for this and sepsis on 04/18/22 but left AMA on 04/19/22 without oral abx PCP has called in bactrim BID for 10 days - reviewed this with patient and he states he will go pick this up and start today PE was reassuring today - cellulitis appears to be improved since hospitalization Recommend he proceed with Bactrim and can use Tylenol as needed for discomfort Reviewed return and ED precautions- patient voiced understanding and agreement Follow up as needed for persistent or progressing symptoms

## 2022-05-07 ENCOUNTER — Other Ambulatory Visit: Payer: Self-pay | Admitting: Family Medicine

## 2022-05-07 DIAGNOSIS — E871 Hypo-osmolality and hyponatremia: Secondary | ICD-10-CM

## 2022-05-07 DIAGNOSIS — M25311 Other instability, right shoulder: Secondary | ICD-10-CM

## 2022-05-07 DIAGNOSIS — M25511 Pain in right shoulder: Secondary | ICD-10-CM

## 2022-05-08 NOTE — Telephone Encounter (Signed)
Requested medication (s) are due for refill today: yes  Requested medication (s) are on the active medication list: yes  Last refill: Sodium Chloride 03/02/22 #30 with 2 RF, Tramadol 04/09/22 historical provider  Future visit scheduled: no, seen 04/05/22  Notes to clinic:  Not delegated, but the Na Chloride is twice a day and rx is for #30, please assess.    Requested Prescriptions  Pending Prescriptions Disp Refills   sodium chloride 1 g tablet [Pharmacy Med Name: SODIUM CHLORIDE 1 GM TABLET] 30 tablet 2    Sig: TAKE 1 TABLET (1 G TOTAL) BY MOUTH 2 (TWO) TIMES DAILY WITH A MEAL.     Off-Protocol Failed - 05/07/2022 12:55 PM      Failed - Medication not assigned to a protocol, review manually.      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     Not Delegated - Analgesics:  Opioid Agonists Failed - 05/07/2022 12:55 PM      Failed - This refill cannot be delegated      Passed - Urine Drug Screen completed in last 360 days      Passed - Valid encounter within last 3 months    Recent Outpatient Visits           1 week ago Cellulitis of left forearm   [facility] Mecum, [place], PA-C   1 month ago Acute pain of left knee   [facility] [place], Devonne Doughty, DO   2 months ago  Spigelian hernia   [place], DO   6 months ago Abdominal bloating with cramps   [facility] Olin Hauser, DO   9 months ago Hyponatremia   [place], DO              Refused Prescriptions Disp Refills   lisinopril (ZESTRIL) 40 MG tablet [Pharmacy Med Name: LISINOPRIL 40 MG TABLET] 90 tablet 0    Sig: TAKE 1 TABLET BY MOUTH EVERY DAY     Cardiovascular:  ACE Inhibitors Passed - 05/07/2022 12:55 PM      Passed - Cr in normal range and within 180 days    Creat  Date Value Ref Range Status  08/07/2021 0.84 0.70 - 1.30 mg/dL Final   Creatinine, Ser  Date Value Ref Range Status  04/19/2022 1.20 0.61 - 1.24 mg/dL Final         Passed - K in normal range and within 180 days    Potassium  Date Value Ref Range Status  04/19/2022 4.0 3.5 -  5.1 mmol/L Final         Passed - Patient is not pregnant      Passed - Last BP in normal range    BP Readings from Last 1 Encounters:  04/26/22 (!) 169/107         Passed - Valid encounter within last 6 months    Recent Outpatient Visits           1 week ago Cellulitis of left forearm   [place], [place]   1 month ago Acute pain of left knee   [place], DO   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

## 2022-05-08 NOTE — Telephone Encounter (Signed)
Requested Prescriptions  Pending Prescriptions Disp Refills  . lisinopril (ZESTRIL) 40 MG tablet [Pharmacy Med Name: LISINOPRIL 40 MG TABLET] 90 tablet 0    Sig: TAKE 1 TABLET BY MOUTH EVERY DAY     Cardiovascular:  ACE Inhibitors Passed - 05/07/2022 12:55 PM      Passed - Cr in normal range and within 180 days    Creat  Date Value Ref Range Status  08/07/2021 0.84 0.70 - 1.30 mg/dL Final   Creatinine, Ser  Date Value Ref Range Status  04/19/2022 1.20 0.61 - 1.24 mg/dL Final         Passed - K in normal range and within 180 days    Potassium  Date Value Ref Range Status  04/19/2022 4.0 3.5 - 5.1 mmol/L Final         Passed - Patient is not pregnant      Passed - Last BP in normal range    BP Readings from Last 1 Encounters:  04/26/22 (!) 169/107         Passed - Valid encounter within last 6 months    Recent Outpatient Visits          1 week ago Cellulitis of left forearm   [facility] Mecum, [place], PA-C   1 month ago Acute pain of left knee   [place], DO   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 sodium chloride 1 g tablet [Pharmacy Med Name: SODIUM CHLORIDE 1 GM TABLET] 30 tablet 2    Sig: TAKE 1 TABLET (1 G TOTAL) BY MOUTH 2 (TWO) TIMES DAILY WITH A MEAL.     Off-Protocol Failed - 05/07/2022 12:55 PM      Failed - Medication not assigned to a protocol, review manually.      Passed - Valid encounter within last 12 months    Recent Outpatient Visits          1 week ago Cellulitis of left forearm   [place], [place]   1 month ago Acute pain of left knee   [facility] [place], Devonne Doughty, DO   2  months ago Spigelian hernia   [place], DO   6 months ago Abdominal bloating with cramps   [place], DO   9 months ago Hyponatremia   [place], DO             . traMADol (ULTRAM) 50 MG tablet [Pharmacy Med Name: TRAMADOL HCL 50 MG TABLET] 60 tablet 2    Sig: TAKE 1 TABLET BY MOUTH EVERY 8 HOURS AS NEEDED.     Not Delegated - Analgesics:  Opioid Agonists Failed - 05/07/2022 12:55 PM      Failed - This refill cannot be delegated      Passed - Urine Drug Screen completed in last 360 days      Passed - Valid encounter within last 3 months    Recent Outpatient Visits          1  week ago Cellulitis of left forearm   [facility] Mecum, [place] E, [place]   1 month ago Acute pain of left knee   [place], DO   2 months ago Spigelian hernia   [place], DO   6 months ago Abdominal bloating with cramps   [place], DO   9 months ago Hyponatremia   [place], Devonne Doughty, [place]

## 2022-05-11 ENCOUNTER — Other Ambulatory Visit: Payer: Self-pay | Admitting: Family Medicine

## 2022-05-14 ENCOUNTER — Other Ambulatory Visit: Payer: Self-pay

## 2022-05-14 ENCOUNTER — Other Ambulatory Visit: Payer: Self-pay | Admitting: Family Medicine

## 2022-05-14 DIAGNOSIS — I10 Essential (primary) hypertension: Secondary | ICD-10-CM

## 2022-05-14 MED ORDER — LISINOPRIL 40 MG PO TABS: 40.0000 mg | ORAL_TABLET | Freq: Every day | ORAL | 0 refills | Status: DC

## 2022-05-14 NOTE — Telephone Encounter (Signed)
Refused lisinopril 40 mg refill request because requested too early.

## 2022-05-14 NOTE — Telephone Encounter (Signed)
Pt states he only received a 30 day supply in July  Please assist further

## 2022-05-14 NOTE — Telephone Encounter (Signed)
Refilled 03/20/22 # 90. Pt. Should still have medication.

## 2022-05-15 NOTE — Telephone Encounter (Signed)
Requested medication (s) are due for refill today - no  Requested medication (s) are on the active medication list -yes  Future visit scheduled -yes  Last refill: 05/14/22 #30  Notes to clinic: Requesting 90 day supply- patient states he takes double dosing at times- request sent for provider review   Requested Prescriptions  Pending Prescriptions Disp Refills   lisinopril (ZESTRIL) 40 MG tablet [Pharmacy Med Name: LISINOPRIL 40 MG TABLET] 90 tablet 0    Sig: TAKE 1 TABLET BY MOUTH EVERY DAY     Cardiovascular:  ACE Inhibitors Passed - 05/14/2022 12:08 PM      Passed - Cr in normal range and within 180 days    Creat  Date Value Ref Range Status  08/07/2021 0.84 0.70 - 1.30 mg/dL Final   Creatinine, Ser  Date Value Ref Range Status  04/19/2022 1.20 0.61 - 1.24 mg/dL Final         Passed - K in normal range and within 180 days    Potassium  Date Value Ref Range Status  04/19/2022 4.0 3.5 - 5.1 mmol/L Final         Passed - Patient is not pregnant      Passed - Last BP in normal range    BP Readings from Last 1 Encounters:  04/26/22 (!) 169/107         Passed - Valid encounter within last 6 months    Recent Outpatient Visits           2 weeks ago Cellulitis of left forearm   [place], PA-C   1 month ago Acute pain of left knee   [facility] [place], Devonne Doughty, DO   2 months ago Spigelian hernia   [place], DO   7 months ago Abdominal bloating with cramps   [facility] [place], Devonne Doughty, DO   9 months ago Hyponatremia   [place], DO       Future Appointments             In 1 week [place], Devonne Doughty, [facility], [facility]               Requested Prescriptions  Pending Prescriptions Disp Refills   lisinopril (ZESTRIL) 40 MG tablet [Pharmacy Med Name: LISINOPRIL 40 MG  TABLET] 90 tablet 0    Sig: TAKE 1 TABLET BY MOUTH EVERY DAY     Cardiovascular:  ACE Inhibitors Passed - 05/14/2022 12:08 PM      Passed - Cr in normal range and within 180 days    Creat  Date Value Ref Range Status  08/07/2021 0.84 0.70 - 1.30 mg/dL Final   Creatinine, Ser  Date Value Ref Range Status  04/19/2022 1.20 0.61 - 1.24 mg/dL Final         Passed - K in normal range and within 180 days    Potassium  Date Value Ref Range Status  04/19/2022 4.0 3.5 - 5.1 mmol/L Final         Passed - Patient is not pregnant      Passed - Last BP in normal range    BP Readings from Last 1 Encounters:  04/26/22 (!) 169/107         Passed - Valid encounter within last 6 months    Recent Outpatient Visits  2 weeks ago Cellulitis of left forearm   [facility] Mecum, [place], [place]   1 month ago Acute pain of left knee   [place], DO   2 months ago Spigelian hernia   [place], DO   7 months ago Abdominal bloating with cramps   [place], DO   9 months ago Hyponatremia   [facility] [place], Devonne Doughty, DO       Future Appointments             In 1 week [place], Devonne Doughty, DO [facility], [facility]

## 2022-05-22 ENCOUNTER — Other Ambulatory Visit: Payer: Self-pay | Admitting: Family Medicine

## 2022-05-22 DIAGNOSIS — I10 Essential (primary) hypertension: Secondary | ICD-10-CM

## 2022-05-22 NOTE — Telephone Encounter (Signed)
Refilled 05/14/2022 #30 0 rf - confirmed by same pharmacy. Requested Prescriptions  Pending Prescriptions Disp Refills  . lisinopril (ZESTRIL) 40 MG tablet [Pharmacy Med Name: LISINOPRIL 40 MG TABLET] 30 tablet 0    Sig: TAKE 1 TABLET BY MOUTH EVERY DAY     Cardiovascular:  ACE Inhibitors Passed - 05/22/2022  9:29 AM      Passed - Cr in normal range and within 180 days    Creat  Date Value Ref Range Status  08/07/2021 0.84 0.70 - 1.30 mg/dL Final   Creatinine, Ser  Date Value Ref Range Status  04/19/2022 1.20 0.61 - 1.24 mg/dL Final         Passed - K in normal range and within 180 days    Potassium  Date Value Ref Range Status  04/19/2022 4.0 3.5 - 5.1 mmol/L Final         Passed - Patient is not pregnant      Passed - Last BP in normal range    BP Readings from Last 1 Encounters:  04/26/22 (!) 169/107         Passed - Valid encounter within last 6 months    Recent Outpatient Visits          3 weeks ago Cellulitis of left forearm   [place], PA-C   1 month ago Acute pain of left knee   [place], DO   2 months ago Spigelian hernia   [place], DO   7 months ago Abdominal bloating with cramps   [place], DO   9 months ago Hyponatremia   [place], DO      Future Appointments            In 2 days [place], Devonne Doughty, [facility], [facility]

## 2022-05-24 ENCOUNTER — Ambulatory Visit: Payer: Medicare Other | Admitting: Family Medicine

## 2022-05-27 ENCOUNTER — Other Ambulatory Visit: Payer: Self-pay | Admitting: Family Medicine

## 2022-05-27 DIAGNOSIS — I10 Essential (primary) hypertension: Secondary | ICD-10-CM

## 2022-05-28 NOTE — Telephone Encounter (Signed)
Requested Prescriptions  Pending Prescriptions Disp Refills  . lisinopril (ZESTRIL) 40 MG tablet [Pharmacy Med Name: LISINOPRIL 40 MG TABLET] 90 tablet 0    Sig: TAKE 1 TABLET BY MOUTH EVERY DAY     Cardiovascular:  ACE Inhibitors Passed - 05/27/2022  4:29 PM      Passed - Cr in normal range and within 180 days    Creat  Date Value Ref Range Status  08/07/2021 0.84 0.70 - 1.30 mg/dL Final   Creatinine, Ser  Date Value Ref Range Status  04/19/2022 1.20 0.61 - 1.24 mg/dL Final         Passed - K in normal range and within 180 days    Potassium  Date Value Ref Range Status  04/19/2022 4.0 3.5 - 5.1 mmol/L Final         Passed - Patient is not pregnant      Passed - Last BP in normal range    BP Readings from Last 1 Encounters:  04/26/22 (!) 169/107         Passed - Valid encounter within last 6 months    Recent Outpatient Visits          1 month ago Cellulitis of left forearm   [facility] Mecum, [place], PA-C   1 month ago Acute pain of left knee   [place], DO   2 months ago Spigelian hernia   [place], DO   7 months ago Abdominal bloating with cramps   [place], DO   9 months ago Hyponatremia   [place], DO      Future Appointments            In 4 days [place], Devonne Doughty, [facility], [facility]

## 2022-06-01 ENCOUNTER — Ambulatory Visit: Payer: Medicare Other | Admitting: Family Medicine

## 2022-06-12 ENCOUNTER — Ambulatory Visit: Payer: Medicare Other | Admitting: Family Medicine

## 2022-06-20 ENCOUNTER — Other Ambulatory Visit: Payer: Self-pay | Admitting: Family Medicine

## 2022-06-20 DIAGNOSIS — I10 Essential (primary) hypertension: Secondary | ICD-10-CM

## 2022-06-21 NOTE — Telephone Encounter (Signed)
Requested medication (s) are due for refill today:   Yes  Requested medication (s) are on the active medication list:   Yes  Future visit scheduled:   No   Last ordered: Returned because pharmacy requesting a 90 day supply and a DX Code.   Requested Prescriptions  Pending Prescriptions Disp Refills   amLODipine (NORVASC) 10 MG tablet [Pharmacy Med Name: AMLODIPINE BESYLATE 10 MG TAB] 90 tablet 1    Sig: TAKE 1 TABLET BY MOUTH EVERY DAY     Cardiovascular: Calcium Channel Blockers 2 Passed - 06/20/2022  2:31 PM      Passed - Last BP in normal range    BP Readings from Last 1 Encounters:  04/26/22 (!) 169/107         Passed - Last Heart Rate in normal range    Pulse Readings from Last 1 Encounters:  04/26/22 84         Passed - Valid encounter within last 6 months    Recent Outpatient Visits           1 month ago Cellulitis of left forearm   [facility] Mecum, [place], [place]   2 months ago Acute pain of left knee   [place], DO   3 months ago Spigelian hernia   [place], DO   8 months ago Abdominal bloating with cramps   [place], DO   10 months ago Hyponatremia   [place], Devonne Doughty, [place]

## 2022-07-04 ENCOUNTER — Telehealth: Payer: Self-pay | Admitting: Pharmacist

## 2022-07-04 NOTE — Telephone Encounter (Signed)
   Outreach Note  07/04/2022 Name: Ashdon Gillson MRN: [id number] DOB: 06/07/1971  Referred by: Olin Hauser, DO Reason for referral : No chief complaint on file.   This patient is appearing on the insurance-provided list for being at risk of failing the adherence measure for Statin Therapy for Patients with Cardiovascular Disease (SPC) and Statin Use in Persons with Diabetes (SUPD) medications this calendar year. Outreach attempt #2   Was unable to reach patient via telephone today and unable to leave a message as no voicemail is setup.   Wallace Cullens, PharmD, [place] [phone]

## 2022-07-05 ENCOUNTER — Other Ambulatory Visit: Payer: Self-pay | Admitting: Family Medicine

## 2022-07-05 DIAGNOSIS — I10 Essential (primary) hypertension: Secondary | ICD-10-CM

## 2022-07-05 DIAGNOSIS — I2510 Atherosclerotic heart disease of native coronary artery without angina pectoris: Secondary | ICD-10-CM

## 2022-07-05 NOTE — Telephone Encounter (Signed)
Requested Prescriptions  Pending Prescriptions Disp Refills   carvedilol (COREG) 12.5 MG tablet [Pharmacy Med Name: CARVEDILOL 12.5 MG TABLET] 180 tablet 1    Sig: TAKE 1 TABLET (12.'5MG'$  TOTAL) BY MOUTH TWICE A DAY WITH MEALS     Cardiovascular: Beta Blockers 3 Passed - 07/05/2022 10:31 AM      Passed - Cr in normal range and within 360 days    Creat  Date Value Ref Range Status  08/07/2021 0.84 0.70 - 1.30 mg/dL Final   Creatinine, Ser  Date Value Ref Range Status  04/19/2022 1.20 0.61 - 1.24 mg/dL Final         Passed - AST in normal range and within 360 days    AST  Date Value Ref Range Status  04/17/2022 24 15 - 41 U/L Final         Passed - ALT in normal range and within 360 days    ALT  Date Value Ref Range Status  04/17/2022 22 0 - 44 U/L Final         Passed - Last BP in normal range    BP Readings from Last 1 Encounters:  04/26/22 (!) 169/107         Passed - Last Heart Rate in normal range    Pulse Readings from Last 1 Encounters:  04/26/22 84         Passed - Valid encounter within last 6 months    Recent Outpatient Visits           2 months ago Cellulitis of left forearm   [facility] Mecum, [place], [place]   3 months ago Acute pain of left knee   [place], DO   3 months ago Spigelian hernia   [place], DO   8 months ago Abdominal bloating with cramps   [place], DO   11 months ago Hyponatremia   [place], Devonne Doughty, [place]

## 2022-07-12 ENCOUNTER — Other Ambulatory Visit: Payer: Self-pay | Admitting: Family Medicine

## 2022-07-12 ENCOUNTER — Other Ambulatory Visit: Payer: Self-pay | Admitting: Medical

## 2022-07-12 DIAGNOSIS — I10 Essential (primary) hypertension: Secondary | ICD-10-CM

## 2022-07-12 DIAGNOSIS — E1142 Type 2 diabetes mellitus with diabetic polyneuropathy: Secondary | ICD-10-CM

## 2022-07-12 NOTE — Telephone Encounter (Signed)
Requested Prescriptions  Pending Prescriptions Disp Refills   lisinopril (ZESTRIL) 40 MG tablet [Pharmacy Med Name: LISINOPRIL 40 MG TABLET] 90 tablet 1    Sig: TAKE 1 TABLET BY MOUTH EVERY DAY     Cardiovascular:  ACE Inhibitors Passed - 07/12/2022 11:39 AM      Passed - Cr in normal range and within 180 days    Creat  Date Value Ref Range Status  08/07/2021 0.84 0.70 - 1.30 mg/dL Final   Creatinine, Ser  Date Value Ref Range Status  04/19/2022 1.20 0.61 - 1.24 mg/dL Final         Passed - K in normal range and within 180 days    Potassium  Date Value Ref Range Status  04/19/2022 4.0 3.5 - 5.1 mmol/L Final         Passed - Patient is not pregnant      Passed - Last BP in normal range    BP Readings from Last 1 Encounters:  04/26/22 (!) 169/107         Passed - Valid encounter within last 6 months    Recent Outpatient Visits           2 months ago Cellulitis of left forearm   [facility] Mecum, [place], [place]   3 months ago Acute pain of left knee   [facility] [place], Devonne Doughty, DO   4 months ago Spigelian hernia   [place], DO   8 months ago Abdominal bloating with cramps   [facility] Olin Hauser, DO   11 months ago Hyponatremia   [place], DO               gabapentin (NEURONTIN) 800 MG tablet [Pharmacy Med Name: GABAPENTIN 800 MG TABLET] 360 tablet 3    Sig: TAKE 1 TABLET BY [place]     Neurology: Anticonvulsants - gabapentin Passed - 07/12/2022 11:39 AM      Passed - Cr in normal range and within 360 days    Creat  Date Value Ref Range Status  08/07/2021 0.84 0.70 - 1.30 mg/dL Final   Creatinine, Ser  Date Value Ref Range Status  04/19/2022 1.20 0.61 - 1.24 mg/dL Final         Passed - Completed PHQ-2 or PHQ-9 in the last 360 days      Passed - Valid encounter within last 12  months    Recent Outpatient Visits           2 months ago Cellulitis of left forearm   [place], [place]   3 months ago Acute pain of left knee   [place], DO   4 months ago Spigelian hernia   [place], DO   8 months ago Abdominal bloating with cramps   [place], DO   11 months ago Hyponatremia   [place], Devonne Doughty, [place]

## 2022-08-02 ENCOUNTER — Other Ambulatory Visit: Payer: Self-pay | Admitting: Family Medicine

## 2022-08-02 DIAGNOSIS — E1159 Type 2 diabetes mellitus with other circulatory complications: Secondary | ICD-10-CM

## 2022-08-07 ENCOUNTER — Other Ambulatory Visit: Payer: Self-pay | Admitting: Medical

## 2022-08-10 ENCOUNTER — Other Ambulatory Visit: Payer: Self-pay | Admitting: Family Medicine

## 2022-08-10 DIAGNOSIS — M25311 Other instability, right shoulder: Secondary | ICD-10-CM

## 2022-08-10 DIAGNOSIS — M25511 Pain in right shoulder: Secondary | ICD-10-CM

## 2022-08-10 NOTE — Telephone Encounter (Signed)
Requested medications are due for refill today.  unsure  Requested medications are on the active medications list.  yes  Last refill. 05/09/2022 #60 2 rf  Future visit scheduled.   no  Notes to clinic.  Refill not delegated.    Requested Prescriptions  Pending Prescriptions Disp Refills   traMADol (ULTRAM) 50 MG tablet [Pharmacy Med Name: TRAMADOL HCL 50 MG TABLET] 60 tablet 2    Sig: TAKE 1 TABLET BY MOUTH EVERY 8 HOURS AS NEEDED.     Not Delegated - Analgesics:  Opioid Agonists Failed - 08/10/2022  2:27 PM      Failed - This refill cannot be delegated      Failed - Valid encounter within last 3 months    Recent Outpatient Visits           3 months ago Cellulitis of left forearm   [place], [place]   4 months ago Acute pain of left knee   [place], DO   5 months ago Spigelian hernia   [place], DO   9 months ago Abdominal bloating with cramps   [place], DO   1 year ago Hyponatremia   [place], DO              Passed - Urine Drug Screen completed in last 360 days

## 2022-08-31 ENCOUNTER — Ambulatory Visit: Payer: Self-pay

## 2022-08-31 NOTE — Telephone Encounter (Signed)
   Chief Complaint: Cough, congestion, fever Symptoms: Above Frequency: 3 days ago Pertinent Negatives: Patient denies  Disposition: '[]'$ ED /'[]'$ Urgent Care (no appt availability in office) / '[x]'$ Appointment(In office/virtual)/ '[]'$  [place] [facility]/ '[]'$ Home Care/ '[]'$ Refused Recommended Disposition /'[]'$ [place] [facility]/ '[]'$  Follow-up with PCP Additional Notes: States he is feeling better today.  Answer Assessment - Initial Assessment Questions 1. ONSET: "When did the cough begin?"      3 days ago 2. SEVERITY: "How bad is the cough today?"      Severe 3. SPUTUM: "Describe the color of your sputum" (none, dry cough; clear, white, yellow, green)     Clear 4. HEMOPTYSIS: "Are you coughing up any blood?" If so ask: "How much?" (flecks, streaks, tablespoons, etc.)     No 5. DIFFICULTY BREATHING: "Are you having difficulty breathing?" If Yes, ask: "How bad is it?" (e.g., mild, moderate, severe)    - MILD: No SOB at rest, mild SOB with walking, speaks normally in sentences, can lie down, no retractions, pulse < 100.    - MODERATE: SOB at rest, SOB with minimal exertion and prefers to sit, cannot lie down flat, speaks in phrases, mild retractions, audible wheezing, pulse 100-120.    - SEVERE: Very SOB at rest, speaks in single words, struggling to breathe, sitting hunched forward, retractions, pulse > 120      Mild 6. FEVER: "Do you have a fever?" If Yes, ask: "What is your temperature, how was it measured, and when did it start?"     Not today 7. CARDIAC HISTORY: "Do you have any history of heart disease?" (e.g., heart attack, congestive heart failure)      No 8. LUNG HISTORY: "Do you have any history of lung disease?"  (e.g., pulmonary embolus, asthma, emphysema)     No 9. PE RISK FACTORS: "Do you have a history of blood clots?" (or: recent major surgery, recent prolonged travel, bedridden)     No 10. OTHER SYMPTOMS: "Do you have any other symptoms?" (e.g., runny nose, wheezing, chest  pain)       Runny nose 11. PREGNANCY: "Is there any chance you are pregnant?" "When was your last menstrual period?"       N/a 12. TRAVEL: "Have you traveled out of the country in the last month?" (e.g., travel history, exposures)       No  Protocols used: Cough - Acute Productive-A-AH

## 2022-08-31 NOTE — Telephone Encounter (Signed)
  Pt has Fever, matted eyes and cough with runny nose/ pt is not sure if he has covid or the flu / please advise / pt scheduled appt for Monday     Voice mailbox not set up, unable to leave a message.

## 2022-08-31 NOTE — Telephone Encounter (Signed)
2nd attempt, pt called, unable to LVM d/t mailbox not set up.  Summary: cold symptoms   Pt has Fever, matted eyes and cough with runny nose/ pt is not sure if he has covid or the flu / please advise / pt scheduled appt for Monday

## 2022-09-03 ENCOUNTER — Encounter: Payer: Self-pay | Admitting: Family Medicine

## 2022-09-03 ENCOUNTER — Ambulatory Visit (INDEPENDENT_AMBULATORY_CARE_PROVIDER_SITE_OTHER): Payer: Medicare Other | Admitting: Family Medicine

## 2022-09-03 VITALS — BP 139/95 | HR 89 | Ht 72.0 in | Wt 200.0 lb

## 2022-09-03 DIAGNOSIS — J441 Chronic obstructive pulmonary disease with (acute) exacerbation: Secondary | ICD-10-CM | POA: Diagnosis not present

## 2022-09-03 DIAGNOSIS — R0981 Nasal congestion: Secondary | ICD-10-CM | POA: Diagnosis not present

## 2022-09-03 DIAGNOSIS — H1033 Unspecified acute conjunctivitis, bilateral: Secondary | ICD-10-CM | POA: Diagnosis not present

## 2022-09-03 DIAGNOSIS — I2510 Atherosclerotic heart disease of native coronary artery without angina pectoris: Secondary | ICD-10-CM

## 2022-09-03 LAB — POC COVID19 BINAXNOW: SARS Coronavirus 2 Ag: NEGATIVE

## 2022-09-03 LAB — POC INFLUENZA A&B (BINAX/QUICKVUE)
Influenza A, POC: NEGATIVE
Influenza B, POC: NEGATIVE

## 2022-09-03 MED ORDER — ERYTHROMYCIN 5 MG/GM OP OINT: 1.0000 | TOPICAL_OINTMENT | Freq: Three times a day (TID) | OPHTHALMIC | 0 refills | Status: DC

## 2022-09-03 MED ORDER — AZITHROMYCIN 250 MG PO TABS: ORAL_TABLET | ORAL | 0 refills | Status: DC

## 2022-09-03 MED ORDER — IPRATROPIUM BROMIDE 0.06 % NA SOLN: 2.0000 | Freq: Four times a day (QID) | NASAL | 0 refills | Status: DC

## 2022-09-03 MED ORDER — PREDNISONE 20 MG PO TABS: ORAL_TABLET | ORAL | 0 refills | Status: DC

## 2022-09-03 NOTE — Patient Instructions (Addendum)
   Please schedule a Follow-up Appointment to: Return if symptoms worsen or fail to improve.  If you have any other questions or concerns, please feel free to call the office or send a message through MyChart. You may also schedule an earlier appointment if necessary.  Additionally, you may be receiving a survey about your experience at our office within a few days to 1 week by e-mail or mail. We value your feedback.  Itzel Lowrimore, DO [facility], [facility] 

## 2022-09-03 NOTE — Progress Notes (Signed)
Subjective:    Patient ID: Tyler Martinez, male    DOB: 1970-10-10, 52 y.o.   MRN: [id number]  Tyler Martinez is a 52 y.o. male presenting on 09/03/2022 for watery eyes and Nasal Congestion  Patient presents for a same day appointment.  HPI  Acute Viral Syndrome COPD Exacerbation Reports onset 1 week ago, bilateral eye watering redness drainage, coughing respiratory symptoms, fever. Now has worsening wheezing cough dyspnea at times, productive cough. Has Albuterol PRN Admits bilateral eye irritation still persistent Admits some diarrhea today      03/09/2022    1:49 PM 10/25/2021    2:08 PM 10/16/2021    9:36 AM  Depression screen PHQ 2/9  Decreased Interest 1 0 1  Down, Depressed, Hopeless '2 1 1  '$ PHQ - 2 Score '3 1 2  '$ Altered sleeping '2 3 1  '$ Tired, decreased energy '1 3 1  '$ Change in appetite 1 1 0  Feeling bad or failure about yourself  0 0 0  Trouble concentrating 0 0 1  Moving slowly or fidgety/restless 1 0 0  Suicidal thoughts 0 0 0  PHQ-9 Score '8 8 5  '$ Difficult doing work/chores Not difficult at all Not difficult at all Not difficult at all    Social History   Tobacco Use   Smoking status: Every Day    Packs/day: 1.00    Years: 8.00    Total pack years: 8.00    Types: Cigarettes   Smokeless tobacco: Never  Vaping Use   Vaping Use: Never used  Substance Use Topics   Alcohol use: Yes    Alcohol/week: 2.0 standard drinks of alcohol    Types: 2 Standard drinks or equivalent per week    Comment: Drinks vodka every evening   Drug use: Not Currently    Types: Amphetamines    Review of Systems Per HPI unless specifically indicated above     Objective:    BP (!) 139/95   Pulse 89   Ht 6' (1.829 m)   Wt 200 lb (90.7 kg)   SpO2 96%   BMI 27.12 kg/m   Wt Readings from Last 3 Encounters:  09/03/22 200 lb (90.7 kg)  04/26/22 200 lb 3.2 oz (90.8 kg)  04/18/22 192 lb 3.2 oz (87.2 kg)    Physical Exam Vitals and nursing note reviewed.  Constitutional:       General: He is not in acute distress.    Appearance: He is well-developed. He is not diaphoretic.     Comments: Well-appearing, comfortable, cooperative  HENT:     Head: Normocephalic and atraumatic.     Right Ear: Tympanic membrane, ear canal and external ear normal. There is no impacted cerumen.     Left Ear: Tympanic membrane, ear canal and external ear normal. There is no impacted cerumen.     Nose: Congestion present.  Eyes:     General:        Right eye: No discharge.        Left eye: No discharge.     Extraocular Movements: Extraocular movements intact.     Comments: Conjunctival injection bilateral  Neck:     Thyroid: No thyromegaly.  Cardiovascular:     Rate and Rhythm: Normal rate and regular rhythm.     Pulses: Normal pulses.     Heart sounds: Normal heart sounds. No murmur heard. Pulmonary:     Effort: Pulmonary effort is normal. No respiratory distress.     Breath sounds: Wheezing present. No  rales.  Musculoskeletal:        General: Normal range of motion.     Cervical back: Normal range of motion and neck supple.  Lymphadenopathy:     Cervical: No cervical adenopathy.  Skin:    General: Skin is warm and dry.     Findings: No erythema or rash.  Neurological:     Mental Status: He is alert and oriented to person, place, and time. Mental status is at baseline.  Psychiatric:        Behavior: Behavior normal.     Comments: Well groomed, good eye contact, normal speech and thoughts    Results for orders placed or performed in visit on 09/03/22  POC COVID-19  Result Value Ref Range   SARS Coronavirus 2 Ag Negative Negative  POC Influenza A&B (Binax test)  Result Value Ref Range   Influenza A, POC Negative Negative   Influenza B, POC Negative Negative      Assessment & Plan:   Problem List Items Addressed This Visit     Coronary artery disease involving native coronary artery of native heart without angina pectoris   Other Visit Diagnoses     COPD with  acute exacerbation ([place])    -  Primary   Relevant Medications   azithromycin (ZITHROMAX Z-PAK) 250 MG tablet   predniSONE (DELTASONE) 20 MG tablet   ipratropium (ATROVENT) 0.06 % nasal spray   Nasal congestion       Relevant Medications   ipratropium (ATROVENT) 0.06 % nasal spray   Other Relevant Orders   POC COVID-19 (Completed)   POC Influenza A&B (Binax test) (Completed)   Acute bacterial conjunctivitis of both eyes       Relevant Medications   erythromycin ophthalmic ointment   azithromycin (ZITHROMAX Z-PAK) 250 MG tablet       Clinically with acute viral syndrome Negative Flu and COVID rapid test today in office Recent fever, currently not active Conjunctivitis, bilateral - question if bacterial component Will order Erythromycin eye ointment, has used this in the past.  For URI symptoms, seems to have provoked COPD Exacerbation, notable bronchospasm on exam Start Azithromycin Z pak (antibiotic) 2 tabs day 1, then 1 tab x 4 days, complete entire course even if improved Start Prednisone taper Start Atrovent nasal spray decongestant 2 sprays in each nostril up to 4 times daily for 7 days    Meds ordered this encounter  Medications   erythromycin ophthalmic ointment    Sig: Place 1 Application into both eyes 3 (three) times daily. For 7 days    Dispense:  3.5 g    Refill:  0   azithromycin (ZITHROMAX Z-PAK) 250 MG tablet    Sig: Take 2 tabs ('500mg'$  total) on Day 1. Take 1 tab ('250mg'$ ) daily for next 4 days.    Dispense:  6 tablet    Refill:  0   predniSONE (DELTASONE) 20 MG tablet    Sig: Take daily with food. Start with '60mg'$  (3 pills) x 2 days, then reduce to '40mg'$  (2 pills) x 2 days, then '20mg'$  (1 pill) x 3 days    Dispense:  13 tablet    Refill:  0   ipratropium (ATROVENT) 0.06 % nasal spray    Sig: Place 2 sprays into both nostrils 4 (four) times daily. For up to 5-7 days then stop.    Dispense:  15 mL    Refill:  0      Follow up plan: Return if symptoms worsen  or fail to improve.   Nobie Putnam, [facility] 09/03/2022, 2:54 PM

## 2022-09-06 ENCOUNTER — Other Ambulatory Visit: Payer: Self-pay | Admitting: Medical

## 2022-09-13 ENCOUNTER — Other Ambulatory Visit: Payer: Self-pay | Admitting: Family Medicine

## 2022-09-13 DIAGNOSIS — E871 Hypo-osmolality and hyponatremia: Secondary | ICD-10-CM

## 2022-09-13 NOTE — Telephone Encounter (Signed)
Requested medication (s) are due for refill today: Yes  Requested medication (s) are on the active medication list: Yes  Last refill:  05/09/22  Future visit scheduled: No  Notes to clinic:  No protocol.    Requested Prescriptions  Pending Prescriptions Disp Refills   sodium chloride 1 g tablet [Pharmacy Med Name: SODIUM CHLORIDE 1 GM TABLET] 60 tablet 2    Sig: TAKE 1 TABLET (1 G TOTAL) BY MOUTH 2 (TWO) TIMES DAILY WITH A MEAL.     Off-Protocol Failed - 09/13/2022  3:14 PM      Failed - Medication not assigned to a protocol, review manually.      Passed - Valid encounter within last 12 months    Recent Outpatient Visits           1 week ago COPD with acute exacerbation [facility])   [place], DO   4 months ago Cellulitis of left forearm   [facility] Mecum, [place] E, [place]   5 months ago Acute pain of left knee   [place], DO   6 months ago Spigelian hernia   [place], DO   11 months ago Abdominal bloating with cramps   [place], [place]

## 2022-09-26 ENCOUNTER — Other Ambulatory Visit: Payer: Self-pay | Admitting: Family Medicine

## 2022-09-26 DIAGNOSIS — R0981 Nasal congestion: Secondary | ICD-10-CM

## 2022-09-26 NOTE — Telephone Encounter (Signed)
Requested medication (s) are due for refill today: yes  Requested medication (s) are on the active medication list: yes  Last refill:  09/03/22  Future visit scheduled: yes  Notes to clinic:   Medication not assigned to a protocol, review manually.      Requested Prescriptions  Pending Prescriptions Disp Refills   ipratropium (ATROVENT) 0.06 % nasal spray [Pharmacy Med Name: IPRATROPIUM 0.06% SPRAY]  1    Sig: Place 2 sprays into both nostrils 4 (four) times daily. For up to 5-7 days then stop.     Off-Protocol Failed - 09/26/2022  8:31 AM      Failed - Medication not assigned to a protocol, review manually.      Passed - Valid encounter within last 12 months    Recent Outpatient Visits           3 weeks ago COPD with acute exacerbation [facility])   [place], DO   5 months ago Cellulitis of left forearm   [facility] Mecum, [place] E, [place]   5 months ago Acute pain of left knee   [facility] Olin Hauser, DO   6 months ago Spigelian hernia   [place], [place]   11 months ago Abdominal bloating with cramps   [place], DO             Off-Protocol Failed - 09/26/2022  8:31 AM      Failed - Medication not assigned to a protocol, review manually.      Passed - Valid encounter within last 12 months    Recent Outpatient Visits           3 weeks ago COPD with acute exacerbation [facility])   [place], DO   5 months ago Cellulitis of left forearm   [facility] Mecum, [place] E, [place]   5 months ago Acute pain of left knee   [facility] Olin Hauser, DO   6 months ago Spigelian hernia   [place], [place]   11 months ago Abdominal bloating with cramps   [facility] [place], Devonne Doughty, [place]

## 2022-10-12 ENCOUNTER — Telehealth: Payer: Self-pay | Admitting: Family Medicine

## 2022-10-12 NOTE — Telephone Encounter (Signed)
Spoke with patient to sched his AWV he was driving and req CB later

## 2022-10-19 ENCOUNTER — Telehealth: Payer: Self-pay | Admitting: Family Medicine

## 2022-10-19 NOTE — Telephone Encounter (Signed)
Called patient to schedule Medicare Annual Wellness Visit (AWV). No voicemail available to leave a message.  Last date of AWV: 10/25/2021  Please schedule an appointment at any time with Kirke Shaggy, NHA      Thank you ,  Sherol Dade; [place]: [phone]

## 2022-11-07 ENCOUNTER — Other Ambulatory Visit: Payer: Self-pay | Admitting: Family Medicine

## 2022-11-07 DIAGNOSIS — M25511 Pain in right shoulder: Secondary | ICD-10-CM

## 2022-11-07 DIAGNOSIS — M25311 Other instability, right shoulder: Secondary | ICD-10-CM

## 2022-11-08 NOTE — Telephone Encounter (Signed)
Requested medication (s) are due for refill today: yes  Requested medication (s) are on the active medication list: yes  Last refill:  08/10/22  Future visit scheduled: no  Notes to clinic:  Unable to refill per protocol, cannot delegate.      Requested Prescriptions  Pending Prescriptions Disp Refills   traMADol (ULTRAM) 50 MG tablet [Pharmacy Med Name: TRAMADOL HCL 50 MG TABLET] 60 tablet 2    Sig: TAKE 1 TABLET BY MOUTH EVERY 8 HOURS AS NEEDED     Not Delegated - Analgesics:  Opioid Agonists Failed - 11/07/2022  9:48 AM      Failed - This refill cannot be delegated      Passed - Urine Drug Screen completed in last 360 days      Passed - Valid encounter within last 3 months    Recent Outpatient Visits           2 months ago COPD with acute exacerbation [facility])   [place], DO   6 months ago Cellulitis of left forearm   [facility] Mecum, [place] E, [place]   7 months ago Acute pain of left knee   [place], DO   8 months ago Spigelian hernia   [place], DO   1 year ago Abdominal bloating with cramps   [place], Devonne Dought[place], [place]

## 2022-11-30 ENCOUNTER — Other Ambulatory Visit: Payer: Self-pay | Admitting: Family Medicine

## 2022-11-30 DIAGNOSIS — I10 Essential (primary) hypertension: Secondary | ICD-10-CM

## 2022-11-30 NOTE — Telephone Encounter (Signed)
Requested Prescriptions  Refused Prescriptions Disp Refills   lisinopril (ZESTRIL) 40 MG tablet [Pharmacy Med Name: LISINOPRIL 40 MG TABLET] 90 tablet 1    Sig: TAKE 1 TABLET BY MOUTH EVERY DAY     Cardiovascular:  ACE Inhibitors Failed - 11/30/2022  1:21 PM      Failed - Cr in normal range and within 180 days    Creat  Date Value Ref Range Status  08/07/2021 0.84 0.70 - 1.30 mg/dL Final   Creatinine, Ser  Date Value Ref Range Status  04/19/2022 1.20 0.61 - 1.24 mg/dL Final         Failed - K in normal range and within 180 days    Potassium  Date Value Ref Range Status  04/19/2022 4.0 3.5 - 5.1 mmol/L Final         Failed - Last BP in normal range    BP Readings from Last 1 Encounters:  09/03/22 (!) 139/95         Passed - Patient is not pregnant      Passed - Valid encounter within last 6 months    Recent Outpatient Visits           2 months ago COPD with acute exacerbation [facility])   [place] [facility] [place], Netta Neat, DO   7 months ago Cellulitis of left forearm   [facility] Mecum, [place] E, [place]   7 months ago Acute pain of left knee   [facility] Smitty Cords, DO   8 months ago Spigelian hernia   [place] Smitty Cords, DO   1 year ago Abdominal bloating with cramps   [place] [facility] [place], Netta Neat, [place]

## 2022-12-10 ENCOUNTER — Telehealth: Payer: Self-pay | Admitting: Pharmacist

## 2022-12-10 NOTE — Telephone Encounter (Signed)
   Outreach Note  12/10/2022 Name: Tyler Martinez MRN: [id number] DOB: 07-10-1971  Referred by: Smitty Cords, DO Reason for referral : No chief complaint on file.   Outreach to patient today regarding medication adherence as requested by patient's health plan.   Was unable to reach patient via telephone today and unable to leave a message as no voicemail is setup    Estelle Grumbles, PharmD, [employer] Clinical Pharmacist [facility] [phone]

## 2022-12-13 ENCOUNTER — Other Ambulatory Visit: Payer: Self-pay | Admitting: Family Medicine

## 2022-12-13 ENCOUNTER — Telehealth: Payer: Self-pay | Admitting: Family Medicine

## 2022-12-13 DIAGNOSIS — I10 Essential (primary) hypertension: Secondary | ICD-10-CM

## 2022-12-13 NOTE — Telephone Encounter (Signed)
Ordered today in a separate refill encounter.

## 2022-12-13 NOTE — Telephone Encounter (Signed)
Medication Refill - Medication: lisinopril (ZESTRIL) 40 MG tablet ,amLODipine (NORVASC) 10 MG tablet   Pt calling to f/u on his medication refill. Stated he is out of his medications.   Has the patient contacted their pharmacy? Yes.   No, more refills.   (Agent: If yes, when and what did the pharmacy advise?)  Preferred Pharmacy (with phone number or street name):  [id number] - [place], [place] - [address]  [address] [place] [address]  Phone: [phone] Fax: [phone]  Hours: Not open 24 hours   Has the patient been seen for an appointment in the last year OR does the patient have an upcoming appointment? Yes.    Agent: Please be advised that RX refills may take up to 3 business days. We ask that you follow-up with your pharmacy.

## 2022-12-13 NOTE — Telephone Encounter (Signed)
Requested Prescriptions  Pending Prescriptions Disp Refills   lisinopril (ZESTRIL) 40 MG tablet [Pharmacy Med Name: LISINOPRIL 40 MG TABLET] 90 tablet 0    Sig: TAKE 1 TABLET BY MOUTH EVERY DAY     Cardiovascular:  ACE Inhibitors Failed - 12/13/2022 10:57 AM      Failed - Cr in normal range and within 180 days    Creat  Date Value Ref Range Status  08/07/2021 0.84 0.70 - 1.30 mg/dL Final   Creatinine, Ser  Date Value Ref Range Status  04/19/2022 1.20 0.61 - 1.24 mg/dL Final         Failed - K in normal range and within 180 days    Potassium  Date Value Ref Range Status  04/19/2022 4.0 3.5 - 5.1 mmol/L Final         Failed - Last BP in normal range    BP Readings from Last 1 Encounters:  09/03/22 (!) 139/95         Passed - Patient is not pregnant      Passed - Valid encounter within last 6 months    Recent Outpatient Visits           3 months ago COPD with acute exacerbation (HCC)   [facility] [place], Netta Neat, DO   7 months ago Cellulitis of left forearm   [facility] Mecum, [place] E, [place]   8 months ago Acute pain of left knee   [facility] Smitty Cords, DO   9 months ago Spigelian hernia   [place] [facility] [place], Netta Neat, DO   1 year ago Abdominal bloating with cramps   [facility] Althea Charon, Netta Neat, DO       Future Appointments             In 1 week Althea Charon, Netta Neat, DO [place] [facility]             amLODipine (NORVASC) 10 MG tablet [Pharmacy Med Name: AMLODIPINE BESYLATE 10 MG TAB] 90 tablet 0    Sig: TAKE 1 TABLET BY MOUTH EVERY DAY     Cardiovascular: Calcium Channel Blockers 2 Failed - 12/13/2022 10:57 AM      Failed - Last BP in normal range    BP Readings from Last 1 Encounters:  09/03/22 (!) 139/95         Passed - Last Heart Rate in  normal range    Pulse Readings from Last 1 Encounters:  09/03/22 89         Passed - Valid encounter within last 6 months    Recent Outpatient Visits           3 months ago COPD with acute exacerbation [facility])   [place] [facility] [place], Netta Neat, DO   7 months ago Cellulitis of left forearm   [place] [facility] Mecum, [place] E, [place]   8 months ago Acute pain of left knee   [facility] Smitty Cords, DO   9 months ago Spigelian hernia   [place] [facility] [place], Netta Neat, DO   1 year ago Abdominal bloating with cramps    [facility] Smitty Cords, DO       Future Appointments  In 1 week Althea Charon, Netta Neat, DO [place] [facility], [facility]

## 2022-12-21 ENCOUNTER — Ambulatory Visit: Payer: Medicare Other | Admitting: Family Medicine

## 2023-01-09 ENCOUNTER — Other Ambulatory Visit: Payer: Self-pay | Admitting: Family Medicine

## 2023-01-09 DIAGNOSIS — I10 Essential (primary) hypertension: Secondary | ICD-10-CM

## 2023-01-09 DIAGNOSIS — I2510 Atherosclerotic heart disease of native coronary artery without angina pectoris: Secondary | ICD-10-CM

## 2023-01-09 NOTE — Telephone Encounter (Signed)
Requested Prescriptions  Pending Prescriptions Disp Refills   carvedilol (COREG) 12.5 MG tablet [Pharmacy Med Name: CARVEDILOL 12.5 MG TABLET] 180 tablet 0    Sig: TAKE 1 TABLET (12.5MG  TOTAL) BY MOUTH TWICE A DAY WITH MEALS     Cardiovascular: Beta Blockers 3 Failed - 01/09/2023  2:22 AM      Failed - Last BP in normal range    BP Readings from Last 1 Encounters:  09/03/22 (!) 139/95         Passed - Cr in normal range and within 360 days    Creat  Date Value Ref Range Status  08/07/2021 0.84 0.70 - 1.30 mg/dL Final   Creatinine, Ser  Date Value Ref Range Status  04/19/2022 1.20 0.61 - 1.24 mg/dL Final         Passed - AST in normal range and within 360 days    AST  Date Value Ref Range Status  04/17/2022 24 15 - 41 U/L Final         Passed - ALT in normal range and within 360 days    ALT  Date Value Ref Range Status  04/17/2022 22 0 - 44 U/L Final         Passed - Last Heart Rate in normal range    Pulse Readings from Last 1 Encounters:  09/03/22 89         Passed - Valid encounter within last 6 months    Recent Outpatient Visits           4 months ago COPD with acute exacerbation [facility])   [place] [facility] [place], Netta Neat, DO   8 months ago Cellulitis of left forearm   [place] [facility] Mecum, [place] E, [place]   9 months ago Acute pain of left knee   [facility] Smitty Cords, DO   10 months ago Spigelian hernia   [place] [facility] [place], Netta Neat, DO   1 year ago Abdominal bloating with cramps   [place] [facility] [place], Netta Neat, [place]

## 2023-03-07 ENCOUNTER — Other Ambulatory Visit: Payer: Self-pay

## 2023-03-07 ENCOUNTER — Encounter: Payer: Self-pay | Admitting: Emergency Medicine

## 2023-03-07 ENCOUNTER — Emergency Department
Admission: EM | Admit: 2023-03-07 | Discharge: 2023-03-07 | Disposition: A | Discharge status: Home / Self Care | Payer: Medicare Other | Attending: Emergency Medicine | Admitting: Emergency Medicine

## 2023-03-07 DIAGNOSIS — F10920 Alcohol use, unspecified with intoxication, uncomplicated: Secondary | ICD-10-CM

## 2023-03-07 DIAGNOSIS — F10120 Alcohol abuse with intoxication, uncomplicated: Secondary | ICD-10-CM | POA: Insufficient documentation

## 2023-03-07 DIAGNOSIS — R4182 Altered mental status, unspecified: Secondary | ICD-10-CM | POA: Diagnosis not present

## 2023-03-07 DIAGNOSIS — F1010 Alcohol abuse, uncomplicated: Secondary | ICD-10-CM

## 2023-03-07 DIAGNOSIS — Y908 Blood alcohol level of 240 mg/100 ml or more: Secondary | ICD-10-CM | POA: Insufficient documentation

## 2023-03-07 LAB — COMPREHENSIVE METABOLIC PANEL
ALT: 53 U/L — ABNORMAL HIGH (ref 0–44)
AST: 120 U/L — ABNORMAL HIGH (ref 15–41)
Albumin: 3.2 g/dL — ABNORMAL LOW (ref 3.5–5.0)
Alkaline Phosphatase: 73 U/L (ref 38–126)
Anion gap: 12 (ref 5–15)
BUN: 10 mg/dL (ref 6–20)
CO2: 22 mmol/L (ref 22–32)
Calcium: 8 mg/dL — ABNORMAL LOW (ref 8.9–10.3)
Chloride: 98 mmol/L (ref 98–111)
Creatinine, Ser: 0.62 mg/dL (ref 0.61–1.24)
GFR, Estimated: >60 mL/min (ref >60)
Glucose, Bld: 173 mg/dL — ABNORMAL HIGH (ref 70–99)
Potassium: 4.5 mmol/L (ref 3.5–5.1)
Sodium: 132 mmol/L — ABNORMAL LOW (ref 135–145)
Total Bilirubin: 0.8 mg/dL (ref 0.3–1.2)
Total Protein: 5.5 g/dL — ABNORMAL LOW (ref 6.5–8.1)

## 2023-03-07 LAB — CBC
Hemoglobin: 15.4 g/dL (ref 13.0–17.0)
Platelets: 139 10*3/uL — ABNORMAL LOW (ref 150–400)
WBC: 3.9 10*3/uL — ABNORMAL LOW (ref 4.0–10.5)

## 2023-03-07 LAB — ETHANOL: Alcohol, Ethyl (B): 463 mg/dL (ref <10)

## 2023-03-07 NOTE — ED Triage Notes (Addendum)
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 Patient stumbling upon entering ED. States he would like to go to rehab. Last drink approx 3 hours ago.  Brother concerned that this could be a possible suicide attempt since he has been drinking non-stop since Friday. Patient denies SI/HI.

## 2023-03-07 NOTE — ED Provider Notes (Signed)
[facility] Provider Note   Event Date/Time   First MD Initiated Contact with Patient 03/07/23 1847     (approximate) History  Detox  HPI Tyler Martinez is a 52 y.o. male with a stated past medical history of alcohol abuse who presents requesting resources for detoxification.  Patient has not tried any detoxification services in the past.  Patient's last drink was prior to arrival ROS: Patient currently denies any vision changes, tinnitus, difficulty speaking, facial droop, sore throat, chest pain, shortness of breath, abdominal pain, nausea/vomiting/diarrhea, dysuria, or weakness/numbness/paresthesias in any extremity   Physical Exam  Triage Vital Signs: ED Triage Vitals  Encounter Vitals Group     BP 03/07/23 1648 110/85     Systolic BP Percentile --      Diastolic BP Percentile --      Pulse Rate 03/07/23 1648 95     Resp 03/07/23 1648 18     Temp 03/07/23 1648 98.2 F (36.8 C)     Temp Source 03/07/23 1648 Oral     SpO2 03/07/23 1648 93 %     Weight --      Height --      Head Circumference --      Peak Flow --      Pain Score 03/07/23 1649 0     Pain Loc --      Pain Education --      Exclude from Growth Chart --    Most recent vital signs: Vitals:   03/07/23 1648  BP: 110/85  Pulse: 95  Resp: 18  Temp: 98.2 F (36.8 C)  SpO2: 93%   General: Awake, oriented x4. CV:  Good peripheral perfusion.  Resp:  Normal effort.  Abd:  No distention.  Other:  Elderly well-developed, well-nourished Caucasian male sitting in recliner in no acute distress ED Results / Procedures / Treatments  Labs (all labs ordered are listed, but only abnormal results are displayed) Labs Reviewed  COMPREHENSIVE METABOLIC PANEL - Abnormal; Notable for the following components:      Result Value   Sodium 132 (*)    Glucose, Bld 173 (*)    Calcium 8.0 (*)    Total Protein 5.5 (*)    Albumin 3.2 (*)    AST 120 (*)    ALT 53 (*)    All other components within  normal limits  ETHANOL - Abnormal; Notable for the following components:   Alcohol, Ethyl (B) 463 (*)    All other components within normal limits  CBC - Abnormal; Notable for the following components:   WBC 3.9 (*)    Platelets 139 (*)    All other components within normal limits  URINE DRUG SCREEN, QUALITATIVE ([facility] ONLY)  PROCEDURES: Critical Care performed: No Procedures MEDICATIONS ORDERED IN ED: Medications - No data to display IMPRESSION / MDM / ASSESSMENT AND PLAN / ED COURSE  I reviewed the triage vital signs and the nursing notes.                             The patient is on the cardiac monitor to evaluate for evidence of arrhythmia and/or significant heart rate changes. Patient's presentation is most consistent with acute presentation with potential threat to life or bodily function. Presents with altered mental status. +Slurred, sluggish behavior. Stated EtOH intoxication. Airway maintained. Unlikely intracranial bleed, opioid intoxication or coingestion, sepsis, hypothyroidism. Suspect likely transient course of intoxication with expected  improvement of symptoms as patient metabolizes offending agent.  Plan: frequent reassessments  Reassessment Note: Time: 2 hours since initial presentation. Evaluation: Frequent mental status exams showed improving symptoms and evidence that the patients AMS was secondary to intoxication. Pt able to ambulate without difficulty and PO tolerant.  Patient given resources for inpatient and outpatient detoxification programs in the surrounding area.  Plan DC home with ride and return precautions. Disposition: Discharge home    FINAL CLINICAL IMPRESSION(S) / ED DIAGNOSES   Final diagnoses:  Alcoholic intoxication without complication (HCC)  Alcohol abuse   Rx / DC Orders   ED Discharge Orders     None      Note:  This document was prepared using Dragon voice recognition software and may include unintentional dictation  errors.   Merwyn Katos, MD 03/07/23 2322

## 2023-03-07 NOTE — ED Notes (Signed)
Brother called to pick pt up and take home.  Should be here around 815p tonight.   Pt aware.

## 2023-03-10 ENCOUNTER — Other Ambulatory Visit: Payer: Self-pay | Admitting: Family Medicine

## 2023-03-10 ENCOUNTER — Observation Stay
Admission: EM | Admit: 2023-03-10 | Discharge: 2023-03-11 | Disposition: A | Discharge status: Home / Self Care | Payer: Medicare Other | Attending: Internal Medicine | Admitting: Internal Medicine

## 2023-03-10 ENCOUNTER — Emergency Department: Payer: Medicare Other

## 2023-03-10 ENCOUNTER — Other Ambulatory Visit: Payer: Self-pay

## 2023-03-10 DIAGNOSIS — F1021 Alcohol dependence, in remission: Secondary | ICD-10-CM | POA: Diagnosis present

## 2023-03-10 DIAGNOSIS — Z720 Tobacco use: Secondary | ICD-10-CM | POA: Diagnosis not present

## 2023-03-10 DIAGNOSIS — F1020 Alcohol dependence, uncomplicated: Secondary | ICD-10-CM | POA: Diagnosis not present

## 2023-03-10 DIAGNOSIS — E785 Hyperlipidemia, unspecified: Secondary | ICD-10-CM | POA: Diagnosis present

## 2023-03-10 DIAGNOSIS — E1159 Type 2 diabetes mellitus with other circulatory complications: Secondary | ICD-10-CM | POA: Diagnosis present

## 2023-03-10 DIAGNOSIS — Z7984 Long term (current) use of oral hypoglycemic drugs: Secondary | ICD-10-CM | POA: Insufficient documentation

## 2023-03-10 DIAGNOSIS — I6930 Unspecified sequelae of cerebral infarction: Secondary | ICD-10-CM | POA: Diagnosis not present

## 2023-03-10 DIAGNOSIS — E119 Type 2 diabetes mellitus without complications: Secondary | ICD-10-CM | POA: Diagnosis not present

## 2023-03-10 DIAGNOSIS — E871 Hypo-osmolality and hyponatremia: Secondary | ICD-10-CM | POA: Diagnosis present

## 2023-03-10 DIAGNOSIS — M069 Rheumatoid arthritis, unspecified: Secondary | ICD-10-CM | POA: Insufficient documentation

## 2023-03-10 DIAGNOSIS — I110 Hypertensive heart disease with heart failure: Secondary | ICD-10-CM | POA: Diagnosis not present

## 2023-03-10 DIAGNOSIS — Z8673 Personal history of transient ischemic attack (TIA), and cerebral infarction without residual deficits: Secondary | ICD-10-CM | POA: Insufficient documentation

## 2023-03-10 DIAGNOSIS — R7989 Other specified abnormal findings of blood chemistry: Secondary | ICD-10-CM | POA: Diagnosis present

## 2023-03-10 DIAGNOSIS — Z8674 Personal history of sudden cardiac arrest: Secondary | ICD-10-CM | POA: Insufficient documentation

## 2023-03-10 DIAGNOSIS — Z79899 Other long term (current) drug therapy: Secondary | ICD-10-CM | POA: Insufficient documentation

## 2023-03-10 DIAGNOSIS — R197 Diarrhea, unspecified: Secondary | ICD-10-CM | POA: Diagnosis present

## 2023-03-10 DIAGNOSIS — F1510 Other stimulant abuse, uncomplicated: Secondary | ICD-10-CM | POA: Diagnosis not present

## 2023-03-10 DIAGNOSIS — T675XXA Heat exhaustion, unspecified, initial encounter: Secondary | ICD-10-CM | POA: Diagnosis not present

## 2023-03-10 DIAGNOSIS — D696 Thrombocytopenia, unspecified: Secondary | ICD-10-CM | POA: Diagnosis present

## 2023-03-10 DIAGNOSIS — Y909 Presence of alcohol in blood, level not specified: Secondary | ICD-10-CM | POA: Insufficient documentation

## 2023-03-10 DIAGNOSIS — R531 Weakness: Secondary | ICD-10-CM | POA: Diagnosis not present

## 2023-03-10 DIAGNOSIS — Z7901 Long term (current) use of anticoagulants: Secondary | ICD-10-CM | POA: Diagnosis not present

## 2023-03-10 DIAGNOSIS — R778 Other specified abnormalities of plasma proteins: Secondary | ICD-10-CM | POA: Diagnosis not present

## 2023-03-10 DIAGNOSIS — F17200 Nicotine dependence, unspecified, uncomplicated: Secondary | ICD-10-CM | POA: Diagnosis present

## 2023-03-10 DIAGNOSIS — T679XXA Effect of heat and light, unspecified, initial encounter: Secondary | ICD-10-CM | POA: Insufficient documentation

## 2023-03-10 DIAGNOSIS — I5032 Chronic diastolic (congestive) heart failure: Secondary | ICD-10-CM | POA: Insufficient documentation

## 2023-03-10 DIAGNOSIS — I10 Essential (primary) hypertension: Secondary | ICD-10-CM | POA: Diagnosis not present

## 2023-03-10 DIAGNOSIS — S2241XA Multiple fractures of ribs, right side, initial encounter for closed fracture: Secondary | ICD-10-CM | POA: Diagnosis not present

## 2023-03-10 DIAGNOSIS — R079 Chest pain, unspecified: Secondary | ICD-10-CM | POA: Diagnosis not present

## 2023-03-10 DIAGNOSIS — I2510 Atherosclerotic heart disease of native coronary artery without angina pectoris: Secondary | ICD-10-CM | POA: Diagnosis present

## 2023-03-10 DIAGNOSIS — F17210 Nicotine dependence, cigarettes, uncomplicated: Secondary | ICD-10-CM | POA: Diagnosis not present

## 2023-03-10 DIAGNOSIS — F1910 Other psychoactive substance abuse, uncomplicated: Secondary | ICD-10-CM | POA: Diagnosis present

## 2023-03-10 DIAGNOSIS — R945 Abnormal results of liver function studies: Secondary | ICD-10-CM | POA: Diagnosis present

## 2023-03-10 LAB — PROTIME-INR
INR: 0.9 (ref 0.8–1.2)
Prothrombin Time: 12.7 seconds (ref 11.4–15.2)

## 2023-03-10 LAB — CBC
Hemoglobin: 15.1 g/dL (ref 13.0–17.0)
Platelets: 108 10*3/uL — ABNORMAL LOW (ref 150–400)
WBC: 3.8 10*3/uL — ABNORMAL LOW (ref 4.0–10.5)

## 2023-03-10 LAB — URINALYSIS, ROUTINE W REFLEX MICROSCOPIC
Bacteria, UA: NONE SEEN
Bilirubin Urine: NEGATIVE
Glucose, UA: 50 mg/dL — AB
Ketones, ur: NEGATIVE mg/dL
Leukocytes,Ua: NEGATIVE
Nitrite: NEGATIVE
Protein, ur: 30 mg/dL — AB
Specific Gravity, Urine: 1.012 (ref 1.005–1.030)
pH: 6 (ref 5.0–8.0)

## 2023-03-10 LAB — COMPREHENSIVE METABOLIC PANEL
ALT: 51 U/L — ABNORMAL HIGH (ref 0–44)
AST: 88 U/L — ABNORMAL HIGH (ref 15–41)
Albumin: 4.2 g/dL (ref 3.5–5.0)
Alkaline Phosphatase: 95 U/L (ref 38–126)
Anion gap: 15 (ref 5–15)
BUN: 5 mg/dL — ABNORMAL LOW (ref 6–20)
CO2: 25 mmol/L (ref 22–32)
Calcium: 8.9 mg/dL (ref 8.9–10.3)
Chloride: 84 mmol/L — ABNORMAL LOW (ref 98–111)
Creatinine, Ser: 0.67 mg/dL (ref 0.61–1.24)
GFR, Estimated: >60 mL/min (ref >60)
Glucose, Bld: 247 mg/dL — ABNORMAL HIGH (ref 70–99)
Potassium: 4.1 mmol/L (ref 3.5–5.1)
Sodium: 124 mmol/L — ABNORMAL LOW (ref 135–145)
Total Bilirubin: 1.6 mg/dL — ABNORMAL HIGH (ref 0.3–1.2)
Total Protein: 7.5 g/dL (ref 6.5–8.1)

## 2023-03-10 LAB — URINE DRUG SCREEN, QUALITATIVE (ARMC ONLY)
Amphetamines, Ur Screen: NOT DETECTED
Barbiturates, Ur Screen: NOT DETECTED
Benzodiazepine, Ur Scrn: NOT DETECTED
Cannabinoid 50 Ng, Ur ~~LOC~~: NOT DETECTED
Cocaine Metabolite,Ur ~~LOC~~: NOT DETECTED
MDMA (Ecstasy)Ur Screen: NOT DETECTED
Methadone Scn, Ur: NOT DETECTED
Opiate, Ur Screen: NOT DETECTED
Phencyclidine (PCP) Ur S: NOT DETECTED
Tricyclic, Ur Screen: NOT DETECTED

## 2023-03-10 LAB — APTT: aPTT: 27 seconds (ref 24–36)

## 2023-03-10 LAB — BRAIN NATRIURETIC PEPTIDE: B Natriuretic Peptide: 117.7 pg/mL — ABNORMAL HIGH (ref 0.0–100.0)

## 2023-03-10 LAB — CBG MONITORING, ED
Glucose-Capillary: 199 mg/dL — ABNORMAL HIGH (ref 70–99)
Glucose-Capillary: 230 mg/dL — ABNORMAL HIGH (ref 70–99)

## 2023-03-10 LAB — SODIUM, URINE, RANDOM: Sodium, Ur: 23 mmol/L

## 2023-03-10 LAB — TROPONIN I (HIGH SENSITIVITY)
Troponin I (High Sensitivity): 47 ng/L — ABNORMAL HIGH (ref <18)
Troponin I (High Sensitivity): 39 ng/L — ABNORMAL HIGH (ref <18)
Troponin I (High Sensitivity): 41 ng/L — ABNORMAL HIGH (ref <18)

## 2023-03-10 LAB — TECHNOLOGIST SMEAR REVIEW: Plt Morphology: DECREASED

## 2023-03-10 LAB — ETHANOL: Alcohol, Ethyl (B): <10 mg/dL (ref <10)

## 2023-03-10 LAB — PHOSPHORUS: Phosphorus: 2.9 mg/dL (ref 2.5–4.6)

## 2023-03-10 LAB — OSMOLALITY, URINE: Osmolality, Ur: 105 mOsm/kg — ABNORMAL LOW (ref 300–900)

## 2023-03-10 LAB — LIPASE, BLOOD: Lipase: 49 U/L (ref 11–51)

## 2023-03-10 LAB — MAGNESIUM: Magnesium: 1.3 mg/dL — ABNORMAL LOW (ref 1.7–2.4)

## 2023-03-10 LAB — OSMOLALITY: Osmolality: 266 mOsm/kg — ABNORMAL LOW (ref 275–295)

## 2023-03-10 MED ORDER — SODIUM CHLORIDE 0.9 % IV BOLUS
500.0000 mL | Freq: Once | INTRAVENOUS | Status: AC
  Administered 2023-03-10: 500 mL via INTRAVENOUS

## 2023-03-10 MED ORDER — IPRATROPIUM BROMIDE 0.06 % NA SOLN
2.0000 | Freq: Three times a day (TID) | NASAL | Status: DC
  Administered 2023-03-10 – 2023-03-11 (×2): 2 via NASAL
  Filled 2023-03-10: qty 15

## 2023-03-10 MED ORDER — NICOTINE 21 MG/24HR TD PT24
21.0000 mg | MEDICATED_PATCH | Freq: Every day | TRANSDERMAL | Status: DC
  Administered 2023-03-10 – 2023-03-11 (×2): 21 mg via TRANSDERMAL
  Filled 2023-03-10 (×2): qty 1

## 2023-03-10 MED ORDER — ROSUVASTATIN CALCIUM 20 MG PO TABS
10.0000 mg | ORAL_TABLET | ORAL | Status: DC
  Administered 2023-03-10: 10 mg via ORAL
  Filled 2023-03-10: qty 1

## 2023-03-10 MED ORDER — ALBUTEROL SULFATE (2.5 MG/3ML) 0.083% IN NEBU: 2.0000 mg | INHALATION_SOLUTION | RESPIRATORY_TRACT | Status: DC | PRN

## 2023-03-10 MED ORDER — DM-GUAIFENESIN ER 30-600 MG PO TB12: 1.0000 | ORAL_TABLET | Freq: Two times a day (BID) | ORAL | Status: DC | PRN

## 2023-03-10 MED ORDER — ASPIRIN 325 MG PO TABS: 325.0000 mg | ORAL_TABLET | Freq: Every day | ORAL | Status: DC

## 2023-03-10 MED ORDER — HYDRALAZINE HCL 20 MG/ML IJ SOLN: 5.0000 mg | INTRAMUSCULAR | Status: DC | PRN

## 2023-03-10 MED ORDER — ONDANSETRON HCL 4 MG/2ML IJ SOLN: 4.0000 mg | Freq: Three times a day (TID) | INTRAMUSCULAR | Status: DC | PRN

## 2023-03-10 MED ORDER — THIAMINE HCL 100 MG/ML IJ SOLN: 100.0000 mg | Freq: Every day | INTRAMUSCULAR | Status: DC

## 2023-03-10 MED ORDER — LORAZEPAM 2 MG PO TABS
0.0000 mg | ORAL_TABLET | Freq: Four times a day (QID) | ORAL | Status: DC
  Administered 2023-03-10: 1 mg via ORAL
  Filled 2023-03-10: qty 1

## 2023-03-10 MED ORDER — INSULIN ASPART 100 UNIT/ML IJ SOLN
0.0000 [IU] | Freq: Every day | INTRAMUSCULAR | Status: DC
  Administered 2023-03-10: 2 [IU] via SUBCUTANEOUS
  Filled 2023-03-10: qty 1

## 2023-03-10 MED ORDER — THIAMINE MONONITRATE 100 MG PO TABS
100.0000 mg | ORAL_TABLET | Freq: Every day | ORAL | Status: DC
  Administered 2023-03-10 – 2023-03-11 (×2): 100 mg via ORAL
  Filled 2023-03-10 (×2): qty 1

## 2023-03-10 MED ORDER — MORPHINE SULFATE (PF) 2 MG/ML IV SOLN
2.0000 mg | INTRAVENOUS | Status: DC | PRN
  Administered 2023-03-10: 2 mg via SUBCUTANEOUS
  Filled 2023-03-10: qty 1

## 2023-03-10 MED ORDER — CHLORDIAZEPOXIDE HCL 25 MG PO CAPS
25.0000 mg | ORAL_CAPSULE | Freq: Three times a day (TID) | ORAL | Status: DC
  Administered 2023-03-10 – 2023-03-11 (×3): 25 mg via ORAL
  Filled 2023-03-10 (×3): qty 1

## 2023-03-10 MED ORDER — LORAZEPAM 2 MG/ML IJ SOLN: 0.0000 mg | Freq: Two times a day (BID) | INTRAMUSCULAR | Status: DC

## 2023-03-10 MED ORDER — MORPHINE SULFATE (PF) 2 MG/ML IV SOLN: 2.0000 mg | INTRAVENOUS | Status: DC | PRN

## 2023-03-10 MED ORDER — ASPIRIN 81 MG PO TBEC
81.0000 mg | DELAYED_RELEASE_TABLET | Freq: Every day | ORAL | Status: DC
  Administered 2023-03-11: 81 mg via ORAL
  Filled 2023-03-10: qty 1

## 2023-03-10 MED ORDER — IBUPROFEN 400 MG PO TABS: 200.0000 mg | ORAL_TABLET | Freq: Four times a day (QID) | ORAL | Status: DC | PRN

## 2023-03-10 MED ORDER — NITROGLYCERIN 0.4 MG SL SUBL: 0.4000 mg | SUBLINGUAL_TABLET | SUBLINGUAL | Status: DC | PRN

## 2023-03-10 MED ORDER — PANTOPRAZOLE SODIUM 40 MG PO TBEC
40.0000 mg | DELAYED_RELEASE_TABLET | Freq: Every day | ORAL | Status: DC
  Administered 2023-03-10 – 2023-03-11 (×2): 40 mg via ORAL
  Filled 2023-03-10 (×2): qty 1

## 2023-03-10 MED ORDER — OXYCODONE HCL 5 MG PO TABS
5.0000 mg | ORAL_TABLET | Freq: Four times a day (QID) | ORAL | Status: DC | PRN
  Administered 2023-03-10: 5 mg via ORAL
  Filled 2023-03-10: qty 1

## 2023-03-10 MED ORDER — MAGNESIUM SULFATE 4 GM/100ML IV SOLN
4.0000 g | Freq: Once | INTRAVENOUS | Status: AC
  Administered 2023-03-10: 4 g via INTRAVENOUS
  Filled 2023-03-10: qty 100

## 2023-03-10 MED ORDER — AMLODIPINE BESYLATE 5 MG PO TABS: 10.0000 mg | ORAL_TABLET | Freq: Every day | ORAL | Status: DC

## 2023-03-10 MED ORDER — LORAZEPAM 2 MG/ML IJ SOLN
0.0000 mg | Freq: Four times a day (QID) | INTRAMUSCULAR | Status: DC
  Administered 2023-03-11: 2 mg via INTRAVENOUS
  Administered 2023-03-11: 1 mg via INTRAVENOUS
  Filled 2023-03-10 (×2): qty 1

## 2023-03-10 MED ORDER — LORAZEPAM 2 MG PO TABS: 0.0000 mg | ORAL_TABLET | Freq: Two times a day (BID) | ORAL | Status: DC

## 2023-03-10 MED ORDER — ASPIRIN 325 MG PO TABS
325.0000 mg | ORAL_TABLET | Freq: Once | ORAL | Status: AC
  Administered 2023-03-10: 325 mg via ORAL

## 2023-03-10 MED ORDER — INSULIN ASPART 100 UNIT/ML IJ SOLN
0.0000 [IU] | Freq: Three times a day (TID) | INTRAMUSCULAR | Status: DC
  Administered 2023-03-10 – 2023-03-11 (×2): 2 [IU] via SUBCUTANEOUS
  Administered 2023-03-11: 1 [IU] via SUBCUTANEOUS
  Filled 2023-03-10 (×3): qty 1

## 2023-03-10 MED ORDER — SODIUM CHLORIDE 1 G PO TABS
1.0000 g | ORAL_TABLET | Freq: Two times a day (BID) | ORAL | Status: DC
  Administered 2023-03-10 – 2023-03-11 (×2): 1 g via ORAL
  Filled 2023-03-10 (×2): qty 1

## 2023-03-10 MED ORDER — SODIUM CHLORIDE 0.9 % IV SOLN: INTRAVENOUS | Status: DC

## 2023-03-10 NOTE — ED Triage Notes (Signed)
Pt reports thinks he is dehydrated because he was in the bathroom all night with diarrhea and then today he was out in the sun weed eating for 6 minutes and had to come back inside because he got weak and has some chest pain. Pt face flushed.   Pt was here a few days ago for alcohol detox and reports he has not drank anything at all since then.   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

## 2023-03-10 NOTE — ED Notes (Signed)
Pt reports having chest tightness that started yesterday, the pain is in the center of his chest that is a 4/10. Pt states they had diarrhea started about 0230 this morning, and had at least 5 instances through the night and pt had some gatorade to help with the fluid loss. Pt took an antidiarrheal med to help. Pt states they have been really sweaty today even when sitting still in the a/c.

## 2023-03-10 NOTE — ED Notes (Signed)
Tyler Martinez, Consulting civil engineer, was called and informed that Dr. Marisa Severin would like to move the patient to the main side. No beds on Main are apparently available at this time. Dr. Marisa Severin aware.

## 2023-03-10 NOTE — ED Provider Notes (Signed)
[facility] Provider Note    Event Date/Time   First MD Initiated Contact with Patient 03/10/23 1317     (approximate)   History   Diarrhea and Dehydration   HPI  Tyler Martinez is a 52 y.o. male with history of HTN, CAD, T2DM, daily alcohol use presenting to the emergency department for evaluation of weakness.  Patient states that yesterday he had onset of multiple episodes of nonbloody diarrhea with associated weakness.  Denies abdominal pain.  Has had poor p.o. intake since that time, but tolerating some fluids.  Is try to cut back on alcohol, but reports drinking a 12 pack of beer yesterday.  Earlier today he was in the sun when he felt weak and had some brief chest pain, reports this is now resolved.  Denies shortness of breath.     Physical Exam   Triage Vital Signs: ED Triage Vitals  Encounter Vitals Group     BP 03/10/23 1106 (!) 146/130     Systolic BP Percentile --      Diastolic BP Percentile --      Pulse Rate 03/10/23 1106 (!) 106     Resp 03/10/23 1106 20     Temp 03/10/23 1200 98.4 F (36.9 C)     Temp Source 03/10/23 1200 Oral     SpO2 03/10/23 1106 94 %     Weight 03/10/23 1106 200 lb 9.9 oz (91 kg)     Height 03/10/23 1106 6' (1.829 m)     Head Circumference --      Peak Flow --      Pain Score 03/10/23 1106 0     Pain Loc --      Pain Education --      Exclude from Growth Chart --     Most recent vital signs: Vitals:   03/10/23 1230 03/10/23 1300  BP: (!) 131/96 (!) 133/97  Pulse: 88 93  Resp: 18 20  Temp:    SpO2: 95% 91%     General: Awake, interactive  CV:  Regular rate, good peripheral perfusion.  Resp:  Lungs clear, unlabored respirations.  Abd:  Soft, nondistended, no tenderness to palpation Neuro:  Symmetric facial movement, fluid speech, not tremulous on exam, moving all extremities spontaneously and equally   ED Results / Procedures / Treatments   Labs (all labs ordered are listed, but only abnormal  results are displayed) Labs Reviewed  COMPREHENSIVE METABOLIC PANEL - Abnormal; Notable for the following components:      Result Value   Sodium 124 (*)    Chloride 84 (*)    Glucose, Bld 247 (*)    BUN 5 (*)    AST 88 (*)    ALT 51 (*)    Total Bilirubin 1.6 (*)    All other components within normal limits  URINALYSIS, ROUTINE W REFLEX MICROSCOPIC - Abnormal; Notable for the following components:   Color, Urine AMBER (*)    APPearance CLEAR (*)    Glucose, UA 50 (*)    Hgb urine dipstick SMALL (*)    Protein, ur 30 (*)    All other components within normal limits  CBC - Abnormal; Notable for the following components:   WBC 3.8 (*)    Platelets 108 (*)    All other components within normal limits  OSMOLALITY - Abnormal; Notable for the following components:   Osmolality 266 (*)    All other components within normal limits  TROPONIN I (HIGH  SENSITIVITY) - Abnormal; Notable for the following components:   Troponin I (High Sensitivity) 47 (*)    All other components within normal limits  LIPASE, BLOOD  ETHANOL  PROTIME-INR  OSMOLALITY, URINE  SODIUM, URINE, RANDOM  TROPONIN I (HIGH SENSITIVITY)     EKG EKG independently reviewed interpreted by myself (ER attending) demonstrates:  EKG demonstrates sinus tachycardia at a rate of 107, PR 156, QRS 80, QTc 475, no acute ST changes  RADIOLOGY Imaging independently reviewed and interpreted by myself demonstrates:  CXR without focal consolidation  PROCEDURES:  Critical Care performed: Yes, see critical care procedure note(s)  CRITICAL CARE Performed by: Trinna Post   Total critical care time: 31 minutes  Critical care time was exclusive of separately billable procedures and treating other patients.  Critical care was necessary to treat or prevent imminent or life-threatening deterioration.  Critical care was time spent personally by me on the following activities: development of treatment plan with patient and/or  surrogate as well as nursing, discussions with consultants, evaluation of patient's response to treatment, examination of patient, obtaining history from patient or surrogate, ordering and performing treatments and interventions, ordering and review of laboratory studies, ordering and review of radiographic studies, pulse oximetry and re-evaluation of patient's condition.   Procedures   MEDICATIONS ORDERED IN ED: Medications  LORazepam (ATIVAN) injection 0-4 mg (has no administration in time range)    Or  LORazepam (ATIVAN) tablet 0-4 mg (has no administration in time range)  LORazepam (ATIVAN) injection 0-4 mg (has no administration in time range)    Or  LORazepam (ATIVAN) tablet 0-4 mg (has no administration in time range)  thiamine (VITAMIN B1) tablet 100 mg (has no administration in time range)    Or  thiamine (VITAMIN B1) injection 100 mg (has no administration in time range)  sodium chloride 0.9 % bolus 500 mL (has no administration in time range)  aspirin tablet 325 mg (has no administration in time range)  0.9 %  sodium chloride infusion (has no administration in time range)  morphine (PF) 2 MG/ML injection 2 mg (has no administration in time range)  nitroGLYCERIN (NITROSTAT) SL tablet 0.4 mg (has no administration in time range)  albuterol (VENTOLIN HFA) 108 (90 Base) MCG/ACT inhaler 2 puff (has no administration in time range)  dextromethorphan-guaiFENesin (MUCINEX DM) 30-600 MG per 12 hr tablet 1 tablet (has no administration in time range)     IMPRESSION / MDM / ASSESSMENT AND PLAN / ED COURSE  I reviewed the triage vital signs and the nursing notes.  Differential diagnosis includes, but is not limited to, ACS, occluded stent in the setting of medication nonadherence, electrolyte abnormality, alcohol intoxication or withdrawal, anemia  Patient's presentation is most consistent with acute presentation with potential threat to life or bodily function.  52 year old male  presenting with weakness and chest pain with known history of CAD.  Initial troponin here slightly elevated at 47.  Possibly demand related in the setting of being in the heat, but cannot exclude ACS.  Ordered for aspirin.  Urine without evidence of infection.  Normal hemoglobin.   CMP does demonstrate hyponatremia with a sodium of 124.  This is significantly decreased from 3 days ago when it was 132, but on review of patient's chart he has previously had hyponatremia down to a sodium of 118.  Clinical history suggestive of some component of dehydration, consideration for dilutional hyponatremia in the setting of alcohol use as well.  Given acute hyponatremia, do think patient  is appropriate for admission.  Does not appear to be acutely withdrawing, but is at risk for this.  I have initiated CIWA protocol.  Will reach out to hospitalist team.  Case discussed with Dr. Clyde Lundborg. He will evaluate the patient for anticipated admission.    FINAL CLINICAL IMPRESSION(S) / ED DIAGNOSES   Final diagnoses:  Hyponatremia  Elevated troponin  Diarrhea, unspecified type     Rx / DC Orders   ED Discharge Orders     None        Note:  This document was prepared using Dragon voice recognition software and may include unintentional dictation errors.   Trinna Post, MD 03/10/23 [phone]
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
## 2023-03-10 NOTE — ED Notes (Signed)
Report given to Meghan RN

## 2023-03-11 ENCOUNTER — Emergency Department
Admission: EM | Admit: 2023-03-11 | Discharge: 2023-03-11 | Disposition: A | Discharge status: Home / Self Care | Payer: Medicare Other | Source: Home / Self Care | Attending: Emergency Medicine | Admitting: Emergency Medicine

## 2023-03-11 ENCOUNTER — Other Ambulatory Visit: Payer: Self-pay

## 2023-03-11 DIAGNOSIS — F1510 Other stimulant abuse, uncomplicated: Secondary | ICD-10-CM

## 2023-03-11 DIAGNOSIS — T679XXA Effect of heat and light, unspecified, initial encounter: Secondary | ICD-10-CM | POA: Insufficient documentation

## 2023-03-11 DIAGNOSIS — Y909 Presence of alcohol in blood, level not specified: Secondary | ICD-10-CM | POA: Insufficient documentation

## 2023-03-11 DIAGNOSIS — T675XXA Heat exhaustion, unspecified, initial encounter: Secondary | ICD-10-CM | POA: Diagnosis not present

## 2023-03-11 DIAGNOSIS — F1029 Alcohol dependence with unspecified alcohol-induced disorder: Secondary | ICD-10-CM

## 2023-03-11 DIAGNOSIS — F1010 Alcohol abuse, uncomplicated: Secondary | ICD-10-CM | POA: Insufficient documentation

## 2023-03-11 DIAGNOSIS — I10 Essential (primary) hypertension: Secondary | ICD-10-CM | POA: Insufficient documentation

## 2023-03-11 LAB — CBC WITH DIFFERENTIAL/PLATELET
Abs Immature Granulocytes: 0.02 10*3/uL (ref 0.00–0.07)
Basophils Absolute: 0 10*3/uL (ref 0.0–0.1)
Basophils Relative: 1 %
Eosinophils Absolute: 0 10*3/uL (ref 0.0–0.5)
Eosinophils Relative: 1 %
HCT: 40.6 % (ref 39.0–52.0)
Hemoglobin: 14.7 g/dL (ref 13.0–17.0)
Immature Granulocytes: 1 %
Lymphocytes Relative: 15 %
Lymphs Abs: 0.6 10*3/uL — ABNORMAL LOW (ref 0.7–4.0)
MCH: 37.4 pg — ABNORMAL HIGH (ref 26.0–34.0)
MCHC: 36.2 g/dL — ABNORMAL HIGH (ref 30.0–36.0)
MCV: 103.3 fL — ABNORMAL HIGH (ref 80.0–100.0)
Monocytes Absolute: 0.4 10*3/uL (ref 0.1–1.0)
Monocytes Relative: 10 %
Neutro Abs: 2.7 10*3/uL (ref 1.7–7.7)
Neutrophils Relative %: 72 %
Platelets: 100 10*3/uL — ABNORMAL LOW (ref 150–400)
RBC: 3.93 MIL/uL — ABNORMAL LOW (ref 4.22–5.81)
RDW: 12.5 % (ref 11.5–15.5)
WBC: 3.7 10*3/uL — ABNORMAL LOW (ref 4.0–10.5)
nRBC: 0 % (ref 0.0–0.2)

## 2023-03-11 LAB — BASIC METABOLIC PANEL
Anion gap: 8 (ref 5–15)
BUN: 8 mg/dL (ref 6–20)
CO2: 26 mmol/L (ref 22–32)
Calcium: 7.9 mg/dL — ABNORMAL LOW (ref 8.9–10.3)
Chloride: 96 mmol/L — ABNORMAL LOW (ref 98–111)
Creatinine, Ser: 0.6 mg/dL — ABNORMAL LOW (ref 0.61–1.24)
GFR, Estimated: >60 mL/min (ref >60)
Glucose, Bld: 222 mg/dL — ABNORMAL HIGH (ref 70–99)
Potassium: 3.2 mmol/L — ABNORMAL LOW (ref 3.5–5.1)
Sodium: 130 mmol/L — ABNORMAL LOW (ref 135–145)

## 2023-03-11 LAB — LIPID PANEL
Cholesterol: 142 mg/dL (ref 0–200)
HDL: 80 mg/dL (ref >40)
LDL Cholesterol: 45 mg/dL (ref 0–99)
Total CHOL/HDL Ratio: 1.8 RATIO
Triglycerides: 84 mg/dL (ref <150)
VLDL: 17 mg/dL (ref 0–40)

## 2023-03-11 LAB — HEMOGLOBIN A1C
Hgb A1c MFr Bld: 7.2 % — ABNORMAL HIGH (ref 4.8–5.6)
Mean Plasma Glucose: 159.94 mg/dL

## 2023-03-11 LAB — COMPREHENSIVE METABOLIC PANEL
ALT: 40 U/L (ref 0–44)
AST: 57 U/L — ABNORMAL HIGH (ref 15–41)
Albumin: 3.6 g/dL (ref 3.5–5.0)
Alkaline Phosphatase: 72 U/L (ref 38–126)
Anion gap: 10 (ref 5–15)
BUN: 9 mg/dL (ref 6–20)
CO2: 22 mmol/L (ref 22–32)
Calcium: 8.4 mg/dL — ABNORMAL LOW (ref 8.9–10.3)
Chloride: 100 mmol/L (ref 98–111)
Creatinine, Ser: 0.72 mg/dL (ref 0.61–1.24)
GFR, Estimated: >60 mL/min (ref >60)
Glucose, Bld: 251 mg/dL — ABNORMAL HIGH (ref 70–99)
Potassium: 3.5 mmol/L (ref 3.5–5.1)
Sodium: 132 mmol/L — ABNORMAL LOW (ref 135–145)
Total Bilirubin: 1.5 mg/dL — ABNORMAL HIGH (ref 0.3–1.2)
Total Protein: 6.4 g/dL — ABNORMAL LOW (ref 6.5–8.1)

## 2023-03-11 LAB — CBC
Hemoglobin: 14 g/dL (ref 13.0–17.0)
Platelets: 92 10*3/uL — ABNORMAL LOW (ref 150–400)
WBC: 3.9 10*3/uL — ABNORMAL LOW (ref 4.0–10.5)

## 2023-03-11 LAB — CK: Total CK: 173 U/L (ref 49–397)

## 2023-03-11 LAB — TROPONIN I (HIGH SENSITIVITY)
Troponin I (High Sensitivity): 41 ng/L — ABNORMAL HIGH (ref <18)
Troponin I (High Sensitivity): 37 ng/L — ABNORMAL HIGH (ref <18)
Troponin I (High Sensitivity): 48 ng/L — ABNORMAL HIGH (ref <18)

## 2023-03-11 LAB — MAGNESIUM: Magnesium: 1.9 mg/dL (ref 1.7–2.4)

## 2023-03-11 LAB — HIV ANTIBODY (ROUTINE TESTING W REFLEX): HIV Screen 4th Generation wRfx: NONREACTIVE

## 2023-03-11 LAB — LACTATE DEHYDROGENASE: LDH: 245 U/L — ABNORMAL HIGH (ref 98–192)

## 2023-03-11 LAB — CBG MONITORING, ED
Glucose-Capillary: 189 mg/dL — ABNORMAL HIGH (ref 70–99)
Glucose-Capillary: 123 mg/dL — ABNORMAL HIGH (ref 70–99)

## 2023-03-11 LAB — PHOSPHORUS: Phosphorus: 2.3 mg/dL — ABNORMAL LOW (ref 2.5–4.6)

## 2023-03-11 MED ORDER — FOLIC ACID 1 MG PO TABS
1.0000 mg | ORAL_TABLET | Freq: Every day | ORAL | Status: DC
  Administered 2023-03-11: 1 mg via ORAL
  Filled 2023-03-11: qty 1

## 2023-03-11 MED ORDER — LORAZEPAM 1 MG PO TABS: 0.0000 mg | ORAL_TABLET | ORAL | Status: DC

## 2023-03-11 MED ORDER — SODIUM CHLORIDE 0.9 % IV BOLUS
1000.0000 mL | Freq: Once | INTRAVENOUS | Status: AC
  Administered 2023-03-11: 1000 mL via INTRAVENOUS

## 2023-03-11 MED ORDER — LORAZEPAM 2 MG/ML IJ SOLN
0.0000 mg | INTRAMUSCULAR | Status: DC
  Administered 2023-03-11: 1 mg via INTRAVENOUS
  Filled 2023-03-11: qty 1

## 2023-03-11 MED ORDER — POTASSIUM CHLORIDE CRYS ER 20 MEQ PO TBCR
40.0000 meq | EXTENDED_RELEASE_TABLET | Freq: Once | ORAL | Status: AC
  Administered 2023-03-11: 40 meq via ORAL
  Filled 2023-03-11: qty 2

## 2023-03-11 MED ORDER — THIAMINE MONONITRATE 100 MG PO TABS
100.0000 mg | ORAL_TABLET | Freq: Every day | ORAL | Status: DC
  Filled 2023-03-11: qty 1

## 2023-03-11 MED ORDER — K PHOS MONO-SOD PHOS DI & MONO 155-852-130 MG PO TABS
500.0000 mg | ORAL_TABLET | ORAL | Status: DC
  Administered 2023-03-11: 500 mg via ORAL
  Filled 2023-03-11 (×3): qty 2

## 2023-03-11 MED ORDER — GABAPENTIN 400 MG PO CAPS
800.0000 mg | ORAL_CAPSULE | Freq: Four times a day (QID) | ORAL | Status: DC
  Filled 2023-03-11 (×2): qty 2
  Filled 2023-03-11: qty 8

## 2023-03-11 MED ORDER — ALBUTEROL SULFATE HFA 108 (90 BASE) MCG/ACT IN AERS: 2.0000 | INHALATION_SPRAY | Freq: Four times a day (QID) | RESPIRATORY_TRACT | Status: DC | PRN

## 2023-03-11 MED ORDER — ORAL CARE MOUTH RINSE: 15.0000 mL | OROMUCOSAL | Status: DC | PRN

## 2023-03-11 NOTE — Telephone Encounter (Signed)
Requested medication (s) are due for refill today: Yes  Requested medication (s) are on the active medication list: Yes  Last refill:  12/13/22  Future visit scheduled: No  Notes to clinic:  Unable to refill per protocol, appointment needed.      Requested Prescriptions  Pending Prescriptions Disp Refills   lisinopril (ZESTRIL) 40 MG tablet [Pharmacy Med Name: LISINOPRIL 40 MG TABLET] 90 tablet 0    Sig: TAKE 1 TABLET BY MOUTH EVERY DAY     Cardiovascular:  ACE Inhibitors Failed - 03/10/2023  1:26 AM      Failed - Valid encounter within last 6 months    Recent Outpatient Visits           6 months ago COPD with acute exacerbation [facility])   [place] [facility] [place], Netta Neat, DO   10 months ago Cellulitis of left forearm   [place] [facility] [place], Oswaldo [place], [place]   11 months ago Acute pain of left knee   [place] [facility] Smitty Cords, DO   1 year ago Spigelian hernia   [place] [facility] [place], Netta Neat, DO   1 year ago Abdominal bloating with cramps   [place] [facility] [place], Netta Neat, DO              Passed - Cr in normal range and within 180 days    Creat  Date Value Ref Range Status  08/07/2021 0.84 0.70 - 1.30 mg/dL Final   Creatinine, Ser  Date Value Ref Range Status  03/11/2023 0.60 (L) 0.61 - 1.24 mg/dL Final         Passed - K in normal range and within 180 days    Potassium  Date Value Ref Range Status  03/11/2023 3.2 (L) 3.5 - 5.1 mmol/L Final         Passed - Patient is not pregnant      Passed - Last BP in normal range    BP Readings from Last 1 Encounters:  03/11/23 (!) 112/54          amLODipine (NORVASC) 10 MG tablet [Pharmacy Med Name: AMLODIPINE BESYLATE 10 MG TAB] 90 tablet 0    Sig: TAKE 1 TABLET BY MOUTH EVERY DAY     Cardiovascular: Calcium Channel Blockers 2 Failed - 03/10/2023  1:26 AM       Failed - Valid encounter within last 6 months    Recent Outpatient Visits           6 months ago COPD with acute exacerbation [facility])   [place] [facility], Netta Neat, DO   10 months ago Cellulitis of left forearm   [place] [facility] [place], Oswaldo [place], [place]   11 months ago Acute pain of left knee   [place] [facility] [place] Smitty Cords, DO   1 year ago Spigelian hernia   [place] [facility] [place], Netta Neat, DO   1 year ago Abdominal bloating with cramps   [place] [facility] Smitty Cords, DO              Passed - Last BP in normal range    BP Readings from Last 1 Encounters:  03/11/23 (!) 112/54         Passed - Last Heart Rate in normal range  Pulse Readings from Last 1 Encounters:  03/11/23 65
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
## 2023-03-11 NOTE — ED Provider Notes (Signed)
[facility] Provider Note    Event Date/Time   First MD Initiated Contact with Patient 03/11/23 1547     (approximate)   History   Heat Exposure   HPI  Tyler Martinez is a 52 y.o. male with history of hypertension, stroke, MI, substance abuse, alcohol dependency, elevated A1c presents emergency department stating that he has changes mind would like to be admitted.  Patient was seen here for alcohol withdrawal along with heat exposure.  Noted to have hyponatremia, hypocalcemia, elevated A1c at 7.2, elevated troponin.  Patient states he was out in the heat for another 3 hours.  Quit drinking 2 days ago.  States he was drinking 1/5 of liquor at night and the fifth in the morning decided he would switch over drinking beer and just drinks beer all day long.  Last drink was 2 days ago.  Denies drinking when he left AMA today.      Physical Exam   Triage Vital Signs: ED Triage Vitals  Encounter Vitals Group     BP 03/11/23 1539 108/88     Systolic BP Percentile --      Diastolic BP Percentile --      Pulse Rate 03/11/23 1539 73     Resp 03/11/23 1539 19     Temp 03/11/23 1539 99 F (37.2 C)     Temp Source 03/11/23 1539 Oral     SpO2 03/11/23 1539 98 %     Weight 03/11/23 1540 218 lb (98.9 kg)     Height 03/11/23 1540 6\' 1"  (1.854 m)     Head Circumference --      Peak Flow --      Pain Score 03/11/23 1539 7     Pain Loc --      Pain Education --      Exclude from Growth Chart --     Most recent vital signs: Vitals:   03/11/23 1640 03/11/23 1700  BP:  (!) 114/94  Pulse: (!) 109 (!) 105  Resp:  17  Temp:    SpO2: 95% 98%     General: Awake, no distress.   CV:  Good peripheral perfusion. regular rate and  rhythm Resp:  Normal effort. Lungs cta Abd:  No distention.   Other:      ED Results / Procedures / Treatments   Labs (all labs ordered are listed, but only abnormal results are displayed) Labs Reviewed  CBC WITH DIFFERENTIAL/PLATELET  - Abnormal; Notable for the following components:      Result Value   WBC 3.7 (*)    RBC 3.93 (*)    MCV 103.3 (*)    MCH 37.4 (*)    MCHC 36.2 (*)    Platelets 100 (*)    Lymphs Abs 0.6 (*)    All other components within normal limits  TROPONIN I (HIGH SENSITIVITY) - Abnormal; Notable for the following components:   Troponin I (High Sensitivity) 37 (*)    All other components within normal limits  CK  COMPREHENSIVE METABOLIC PANEL  TROPONIN I (HIGH SENSITIVITY)     EKG  EKG   RADIOLOGY     PROCEDURES:   Procedures   MEDICATIONS ORDERED IN ED: Medications  sodium chloride 0.9 % bolus 1,000 mL (0 mLs Intravenous Stopped 03/11/23 1740)     IMPRESSION / MDM / ASSESSMENT AND PLAN / ED COURSE  I reviewed the triage vital signs and the nursing notes.  Differential diagnosis includes, but is not limited to, EtOH withdrawal, dehydration, new onset diabetes, electrolyte imbalance, rhabdomyolysis  Patient's presentation is most consistent with acute presentation with potential threat to life or bodily function.   Since patient left AMA this morning we will go ahead and repeat his lab work along with start fluids.  Will repeat CIWA protocol.   CBC and troponin are reassuring.  They are in patient's normal trend.  Patient states he would like to leave,     Patient requesting to leave AMA.   This patient has elected to leave against medical advice. In my opinion, the patient has capacity to leave AMA. The patient is clinically sober, free from distracting injury, appears to have intact insight and judgment and reason, and in my opinion has capacity to make decisions.    [name]      FINAL CLINICAL IMPRESSION(S) / ED DIAGNOSES   Final diagnoses:  Heat exposure, initial encounter  Alcohol abuse     Rx / DC Orders   ED Discharge Orders     None        Note:  This document was prepared using Dragon voice recognition software and may  include unintentional dictation errors.    Faythe Ghee, PA-C 03/11/23 Cephus Shelling, MD 03/11/23 [phone]

## 2023-03-11 NOTE — Progress Notes (Addendum)
PHARMACY CONSULT NOTE - FOLLOW UP  Pharmacy Consult for Electrolyte Monitoring and Replacement   Recent Labs: Potassium (mmol/L)  Date Value  03/11/2023 3.2 (L)   Magnesium (mg/dL)  Date Value  40/98/1191 1.3 (L)   Calcium (mg/dL)  Date Value  47/82/9562 7.9 (L)   Albumin (g/dL)  Date Value  13/03/6577 4.2   Phosphorus (mg/dL)  Date Value  46/96/2952 2.9   Sodium (mmol/L)  Date Value  03/11/2023 130 (L)     Assessment: 52 yo admitted w/ chest  pain, diarrhea. Hx polysubstance abuse (alcohol, tobacco, amphetamine, opioids, BZOs + mushrooms + marijuana), hypertension, hyperlipidemia, diabetes mellitus, CAD, diastolic CHF, stroke, anxiety, ADHD, gastritis, cardiac arrest, rheumatoid arthritis   Goal of Therapy:  Electrolytes WNL  Plan:  Na 124 >130 (6 meq over ~20 hrs)  Currently ordered Sodium Chloride 1 gm PO BID K 3.2  Scr 0.60-  Will order KCL 40 meq PO x 1 Phos 2.3- Will order KPhos neutral tabs 500mg  PO q4h x 3 doses Magnesium level pending F/u BMP ordered for 1400 and 2200 and in am  Angelique Blonder ,PharmD Clinical Pharmacist 03/11/2023 8:04 AM

## 2023-03-11 NOTE — ED Notes (Signed)
Pt sitting in stretcher holding leaking IV tubing. IV removed per pt request. Floor cover in IVF - chux applied to dry spill.

## 2023-03-11 NOTE — ED Triage Notes (Signed)
Pt sts that he was admitted to [facility] and left AMA. Pt sts that he is back and wants to get the heat taken out of him due himself being outside in the heat for hours for the last three days.

## 2023-03-11 NOTE — Discharge Summary (Signed)
  Physician Discharge Summary   Patient: [name] MRN: [id number] DOB: April 28, 1971  Admit date:     03/10/2023  Discharge date: 03/11/23 pt left AMA  Discharge Physician: Enedina Finner   PCP: Smitty Cords, DO    Discharge Diagnoses: Principal Problem:   Chest pain Active Problems:   CAD (coronary artery disease)   Hyponatremia   Essential hypertension   Diarrhea   Type 2 diabetes mellitus with vascular disease (HCC)   HLD (hyperlipidemia)   History of cerebrovascular accident (CVA) with residual deficit   Abnormal LFTs   Polysubstance abuse (HCC)   Amphetamine abuse (HCC)   Alcohol dependence (HCC)   Tobacco abuse   Thrombocytopenia (HCC)   Abnormal liver function   Hypomagnesemia   Tyler Martinez is a 52 y.o. male with medical history significant of polysubstance abuse (alcohol, tobacco, amphetamine), hypertension, hyperlipidemia, diabetes mellitus, CAD, diastolic CHF, stroke, anxiety, ADHD, gastritis, cardiac arrest, rheumatoid arthritis, who presents with chest pain and diarrhea.   Pt has extensive h/o alcohol abuse. He was placed on CIWA. He was getting worried about his brother's health and left the ER AMA. [name]  Discharge time spent: less than 30 minutes.  Signed: Enedina Finner, MD [facility] 03/11/2023

## 2023-03-11 NOTE — TOC CM/SW Note (Signed)
Cm received TOC consult regarding Substance Abuse. Cm went to speak with pt and he declined substance abuse education at this time. TOC continues to follow for needs

## 2023-03-11 NOTE — ED Notes (Signed)
Pt presents to main ED nurses station with steady gait stating he was going to "sign myself out". Pt states his brother is dying of cancer. Pt educated on the risks of leaving AMA, pt verbalized understanding. NAD noted on D/C.

## 2023-04-12 ENCOUNTER — Other Ambulatory Visit: Payer: Self-pay | Admitting: Internal Medicine

## 2023-04-12 DIAGNOSIS — I10 Essential (primary) hypertension: Secondary | ICD-10-CM

## 2023-04-12 NOTE — Telephone Encounter (Signed)
Requested medication (s) are due for refill today: yes  Requested medication (s) are on the active medication list: yes  Last refill:  03/11/23  Future visit scheduled: no  Notes to clinic:  Unable to refill per protocol, courtesy refill already given, routing for provider approval.      Requested Prescriptions  Pending Prescriptions Disp Refills   amLODipine (NORVASC) 10 MG tablet [Pharmacy Med Name: AMLODIPINE BESYLATE 10 MG TAB] 90 tablet 1    Sig: TAKE 1 TABLET BY MOUTH EVERY DAY     Cardiovascular: Calcium Channel Blockers 2 Failed - 04/12/2023  9:31 AM      Failed - Last BP in normal range    BP Readings from Last 1 Encounters:  03/11/23 (!) 114/94         Failed - Valid encounter within last 6 months    Recent Outpatient Visits           7 months ago COPD with acute exacerbation [facility])   [place] [facility] [place], Netta Neat, DO   11 months ago Cellulitis of left forearm   [place] [facility] Mecum, Oswaldo Conroy, [place]   1 year ago Acute pain of left knee   [place] Smitty Cords, DO   1 year ago Spigelian hernia   [place] [facility] Smitty Cords, DO   1 year ago Abdominal bloating with cramps   [place] [facility] [place], Netta Neat, DO              Passed - Last Heart Rate in normal range    Pulse Readings from Last 1 Encounters:  03/11/23 (!) 105          lisinopril (ZESTRIL) 40 MG tablet [Pharmacy Med Name: LISINOPRIL 40 MG TABLET] 90 tablet 1    Sig: TAKE 1 TABLET BY MOUTH EVERY DAY     Cardiovascular:  ACE Inhibitors Failed - 04/12/2023  9:31 AM      Failed - Last BP in normal range    BP Readings from Last 1 Encounters:  03/11/23 (!) 114/94         Failed - Valid encounter within last 6 months    Recent Outpatient Visits           7 months ago COPD with acute exacerbation [facility])   [place] [facility] [place], Netta Neat, DO   11 months ago Cellulitis of left forearm   [place] [facility] Mecum, Oswaldo Conroy, [place]   1 year ago Acute pain of left knee   [place] [facility] Smitty Cords, DO   1 year ago Spigelian hernia   [place] [facility] [place], Netta Neat, DO   1 year ago Abdominal bloating with cramps   [place] [facility] [place], Netta Neat, DO              Passed - Cr in normal range and within 180 days    Creat  Date Value Ref Range Status  08/07/2021 0.84 0.70 - 1.30 mg/dL Final   Creatinine, Ser  Date Value Ref Range Status  03/11/2023 0.72 0.61 - 1.24 mg/dL Final         Passed - K in normal range and within 180 days    Potassium  Date Value Ref Range Status  03/11/2023 3.5 3.5 -  5.1 mmol/L Final         Passed - Patient is not pregnant

## 2023-04-15 ENCOUNTER — Observation Stay
Admission: EM | Admit: 2023-04-15 | Discharge: 2023-04-16 | Disposition: A | Discharge status: Home / Self Care | Payer: Medicare Other | Attending: Hospitalist | Admitting: Hospitalist

## 2023-04-15 ENCOUNTER — Emergency Department: Payer: Medicare Other

## 2023-04-15 ENCOUNTER — Encounter: Payer: Self-pay | Admitting: Emergency Medicine

## 2023-04-15 ENCOUNTER — Other Ambulatory Visit: Payer: Self-pay

## 2023-04-15 DIAGNOSIS — J9811 Atelectasis: Secondary | ICD-10-CM | POA: Diagnosis not present

## 2023-04-15 DIAGNOSIS — Z7984 Long term (current) use of oral hypoglycemic drugs: Secondary | ICD-10-CM | POA: Diagnosis not present

## 2023-04-15 DIAGNOSIS — F1021 Alcohol dependence, in remission: Secondary | ICD-10-CM | POA: Diagnosis present

## 2023-04-15 DIAGNOSIS — F17200 Nicotine dependence, unspecified, uncomplicated: Secondary | ICD-10-CM | POA: Diagnosis present

## 2023-04-15 DIAGNOSIS — F332 Major depressive disorder, recurrent severe without psychotic features: Secondary | ICD-10-CM | POA: Diagnosis present

## 2023-04-15 DIAGNOSIS — F1020 Alcohol dependence, uncomplicated: Secondary | ICD-10-CM | POA: Diagnosis present

## 2023-04-15 DIAGNOSIS — E871 Hypo-osmolality and hyponatremia: Principal | ICD-10-CM | POA: Insufficient documentation

## 2023-04-15 DIAGNOSIS — S2241XA Multiple fractures of ribs, right side, initial encounter for closed fracture: Secondary | ICD-10-CM | POA: Diagnosis not present

## 2023-04-15 DIAGNOSIS — R42 Dizziness and giddiness: Secondary | ICD-10-CM

## 2023-04-15 DIAGNOSIS — Z1152 Encounter for screening for COVID-19: Secondary | ICD-10-CM | POA: Insufficient documentation

## 2023-04-15 DIAGNOSIS — E1159 Type 2 diabetes mellitus with other circulatory complications: Secondary | ICD-10-CM | POA: Insufficient documentation

## 2023-04-15 DIAGNOSIS — Z720 Tobacco use: Secondary | ICD-10-CM | POA: Diagnosis present

## 2023-04-15 DIAGNOSIS — R0602 Shortness of breath: Secondary | ICD-10-CM | POA: Diagnosis not present

## 2023-04-15 DIAGNOSIS — F17210 Nicotine dependence, cigarettes, uncomplicated: Secondary | ICD-10-CM | POA: Insufficient documentation

## 2023-04-15 DIAGNOSIS — F419 Anxiety disorder, unspecified: Secondary | ICD-10-CM | POA: Diagnosis present

## 2023-04-15 DIAGNOSIS — G4700 Insomnia, unspecified: Secondary | ICD-10-CM | POA: Diagnosis present

## 2023-04-15 DIAGNOSIS — Z8673 Personal history of transient ischemic attack (TIA), and cerebral infarction without residual deficits: Secondary | ICD-10-CM | POA: Insufficient documentation

## 2023-04-15 DIAGNOSIS — I10 Essential (primary) hypertension: Secondary | ICD-10-CM | POA: Diagnosis not present

## 2023-04-15 DIAGNOSIS — F1911 Other psychoactive substance abuse, in remission: Secondary | ICD-10-CM | POA: Diagnosis present

## 2023-04-15 DIAGNOSIS — R079 Chest pain, unspecified: Secondary | ICD-10-CM | POA: Diagnosis not present

## 2023-04-15 DIAGNOSIS — I6930 Unspecified sequelae of cerebral infarction: Secondary | ICD-10-CM

## 2023-04-15 DIAGNOSIS — J9601 Acute respiratory failure with hypoxia: Secondary | ICD-10-CM

## 2023-04-15 DIAGNOSIS — N529 Male erectile dysfunction, unspecified: Secondary | ICD-10-CM | POA: Diagnosis present

## 2023-04-15 DIAGNOSIS — R059 Cough, unspecified: Secondary | ICD-10-CM | POA: Insufficient documentation

## 2023-04-15 DIAGNOSIS — F10239 Alcohol dependence with withdrawal, unspecified: Secondary | ICD-10-CM | POA: Diagnosis not present

## 2023-04-15 DIAGNOSIS — F1511 Other stimulant abuse, in remission: Secondary | ICD-10-CM | POA: Diagnosis not present

## 2023-04-15 DIAGNOSIS — E785 Hyperlipidemia, unspecified: Secondary | ICD-10-CM | POA: Diagnosis present

## 2023-04-15 DIAGNOSIS — D3502 Benign neoplasm of left adrenal gland: Secondary | ICD-10-CM | POA: Diagnosis not present

## 2023-04-15 DIAGNOSIS — F10939 Alcohol use, unspecified with withdrawal, unspecified: Secondary | ICD-10-CM | POA: Diagnosis present

## 2023-04-15 LAB — BASIC METABOLIC PANEL
Anion gap: 14 (ref 5–15)
BUN: 6 mg/dL (ref 6–20)
CO2: 22 mmol/L (ref 22–32)
Calcium: 8 mg/dL — ABNORMAL LOW (ref 8.9–10.3)
Chloride: 88 mmol/L — ABNORMAL LOW (ref 98–111)
Creatinine, Ser: 1.08 mg/dL (ref 0.61–1.24)
GFR, Estimated: >60 mL/min (ref >60)
Glucose, Bld: 134 mg/dL — ABNORMAL HIGH (ref 70–99)
Potassium: 3.8 mmol/L (ref 3.5–5.1)
Sodium: 124 mmol/L — ABNORMAL LOW (ref 135–145)

## 2023-04-15 LAB — CBC
HCT: 37.2 % — ABNORMAL LOW (ref 39.0–52.0)
Hemoglobin: 13.8 g/dL (ref 13.0–17.0)
MCH: 39 pg — ABNORMAL HIGH (ref 26.0–34.0)
MCHC: 37.1 g/dL — ABNORMAL HIGH (ref 30.0–36.0)
MCV: 105.1 fL — ABNORMAL HIGH (ref 80.0–100.0)
Platelets: 136 10*3/uL — ABNORMAL LOW (ref 150–400)
RBC: 3.54 MIL/uL — ABNORMAL LOW (ref 4.22–5.81)
RDW: 12.9 % (ref 11.5–15.5)
WBC: 4.1 10*3/uL (ref 4.0–10.5)
nRBC: 0 % (ref 0.0–0.2)

## 2023-04-15 LAB — SARS CORONAVIRUS 2 BY RT PCR: SARS Coronavirus 2 by RT PCR: NEGATIVE

## 2023-04-15 LAB — PROCALCITONIN: Procalcitonin: 0.16 ng/mL

## 2023-04-15 LAB — LACTIC ACID, PLASMA
Lactic Acid, Venous: 2.2 mmol/L (ref 0.5–1.9)
Lactic Acid, Venous: 1.5 mmol/L (ref 0.5–1.9)

## 2023-04-15 LAB — TROPONIN I (HIGH SENSITIVITY)
Troponin I (High Sensitivity): 17 ng/L (ref <18)
Troponin I (High Sensitivity): 15 ng/L (ref <18)

## 2023-04-15 LAB — CBG MONITORING, ED: Glucose-Capillary: 157 mg/dL — ABNORMAL HIGH (ref 70–99)

## 2023-04-15 MED ORDER — HYDRALAZINE HCL 20 MG/ML IJ SOLN: 5.0000 mg | Freq: Three times a day (TID) | INTRAMUSCULAR | Status: DC | PRN

## 2023-04-15 MED ORDER — ADULT MULTIVITAMIN W/MINERALS CH
1.0000 | ORAL_TABLET | Freq: Every day | ORAL | Status: DC
  Administered 2023-04-15 – 2023-04-16 (×2): 1 via ORAL
  Filled 2023-04-15 (×2): qty 1

## 2023-04-15 MED ORDER — AMLODIPINE BESYLATE 10 MG PO TABS
10.0000 mg | ORAL_TABLET | Freq: Every day | ORAL | Status: DC
  Administered 2023-04-16: 10 mg via ORAL
  Filled 2023-04-15: qty 1

## 2023-04-15 MED ORDER — MELATONIN 5 MG PO TABS: 5.0000 mg | ORAL_TABLET | Freq: Every evening | ORAL | Status: DC | PRN

## 2023-04-15 MED ORDER — LISINOPRIL 20 MG PO TABS
40.0000 mg | ORAL_TABLET | Freq: Every day | ORAL | Status: DC
  Administered 2023-04-16: 40 mg via ORAL
  Filled 2023-04-15: qty 2

## 2023-04-15 MED ORDER — SODIUM CHLORIDE 0.9 % IV SOLN
500.0000 mg | INTRAVENOUS | Status: DC
  Administered 2023-04-15: 500 mg via INTRAVENOUS
  Filled 2023-04-15: qty 5

## 2023-04-15 MED ORDER — THIAMINE HCL 100 MG PO TABS: 100.0000 mg | ORAL_TABLET | Freq: Every day | ORAL | Status: DC

## 2023-04-15 MED ORDER — INSULIN ASPART 100 UNIT/ML IJ SOLN: 0.0000 [IU] | Freq: Every day | INTRAMUSCULAR | Status: DC

## 2023-04-15 MED ORDER — ACETAMINOPHEN 650 MG RE SUPP: 650.0000 mg | Freq: Four times a day (QID) | RECTAL | Status: DC | PRN

## 2023-04-15 MED ORDER — GABAPENTIN 400 MG PO CAPS
800.0000 mg | ORAL_CAPSULE | Freq: Four times a day (QID) | ORAL | Status: DC
  Administered 2023-04-15 – 2023-04-16 (×2): 800 mg via ORAL
  Filled 2023-04-15 (×4): qty 2
  Filled 2023-04-15: qty 8

## 2023-04-15 MED ORDER — ONDANSETRON HCL 4 MG PO TABS: 4.0000 mg | ORAL_TABLET | Freq: Four times a day (QID) | ORAL | Status: DC | PRN

## 2023-04-15 MED ORDER — LORAZEPAM 2 MG/ML IJ SOLN
1.0000 mg | INTRAMUSCULAR | Status: DC | PRN
  Administered 2023-04-15: 1 mg via INTRAVENOUS
  Filled 2023-04-15: qty 1

## 2023-04-15 MED ORDER — ONDANSETRON HCL 4 MG/2ML IJ SOLN: 4.0000 mg | Freq: Four times a day (QID) | INTRAMUSCULAR | Status: DC | PRN

## 2023-04-15 MED ORDER — FOLIC ACID 1 MG PO TABS: 1.0000 mg | ORAL_TABLET | Freq: Every day | ORAL | Status: DC

## 2023-04-15 MED ORDER — SODIUM CHLORIDE 0.9 % IV SOLN
2.0000 g | INTRAVENOUS | Status: DC
  Administered 2023-04-15: 2 g via INTRAVENOUS
  Filled 2023-04-15: qty 20

## 2023-04-15 MED ORDER — LACTATED RINGERS IV SOLN: INTRAVENOUS | Status: DC

## 2023-04-15 MED ORDER — IPRATROPIUM-ALBUTEROL 0.5-2.5 (3) MG/3ML IN SOLN
3.0000 mL | Freq: Once | RESPIRATORY_TRACT | Status: AC
  Administered 2023-04-15: 3 mL via RESPIRATORY_TRACT
  Filled 2023-04-15: qty 3

## 2023-04-15 MED ORDER — LORAZEPAM 1 MG PO TABS
1.0000 mg | ORAL_TABLET | ORAL | Status: DC | PRN
  Administered 2023-04-16: 1 mg via ORAL
  Administered 2023-04-16: 2 mg via ORAL
  Filled 2023-04-15: qty 1
  Filled 2023-04-15: qty 4
  Filled 2023-04-15: qty 2

## 2023-04-15 MED ORDER — HEPARIN SODIUM (PORCINE) 5000 UNIT/ML IJ SOLN
5000.0000 [IU] | Freq: Three times a day (TID) | INTRAMUSCULAR | Status: DC
  Administered 2023-04-16: 5000 [IU] via SUBCUTANEOUS
  Filled 2023-04-15: qty 1

## 2023-04-15 MED ORDER — ALBUTEROL SULFATE (2.5 MG/3ML) 0.083% IN NEBU: 2.5000 mg | INHALATION_SOLUTION | Freq: Four times a day (QID) | RESPIRATORY_TRACT | Status: DC | PRN

## 2023-04-15 MED ORDER — ACETAMINOPHEN 325 MG PO TABS: 650.0000 mg | ORAL_TABLET | Freq: Four times a day (QID) | ORAL | Status: DC | PRN

## 2023-04-15 MED ORDER — SODIUM CHLORIDE 0.9 % IV BOLUS
1000.0000 mL | Freq: Once | INTRAVENOUS | Status: AC
  Administered 2023-04-15: 1000 mL via INTRAVENOUS

## 2023-04-15 MED ORDER — POLYETHYLENE GLYCOL 3350 17 G PO PACK: 17.0000 g | PACK | Freq: Two times a day (BID) | ORAL | Status: DC | PRN

## 2023-04-15 MED ORDER — SODIUM CHLORIDE 0.9 % IV BOLUS (SEPSIS)
1000.0000 mL | Freq: Once | INTRAVENOUS | Status: AC
  Administered 2023-04-15: 1000 mL via INTRAVENOUS

## 2023-04-15 MED ORDER — THIAMINE MONONITRATE 100 MG PO TABS
100.0000 mg | ORAL_TABLET | Freq: Every day | ORAL | Status: DC
  Administered 2023-04-15 – 2023-04-16 (×2): 100 mg via ORAL
  Filled 2023-04-15 (×2): qty 1

## 2023-04-15 MED ORDER — NICOTINE POLACRILEX 2 MG MT GUM: 2.0000 mg | CHEWING_GUM | OROMUCOSAL | Status: DC | PRN

## 2023-04-15 MED ORDER — INSULIN ASPART 100 UNIT/ML IJ SOLN
0.0000 [IU] | Freq: Three times a day (TID) | INTRAMUSCULAR | Status: DC
  Administered 2023-04-16: 2 [IU] via SUBCUTANEOUS
  Filled 2023-04-15: qty 1

## 2023-04-15 MED ORDER — LORAZEPAM 2 MG/ML IJ SOLN
1.0000 mg | INTRAMUSCULAR | Status: DC | PRN
  Administered 2023-04-15 – 2023-04-16 (×2): 2 mg via INTRAVENOUS
  Administered 2023-04-16: 3 mg via INTRAVENOUS
  Filled 2023-04-15: qty 2
  Filled 2023-04-15 (×2): qty 1

## 2023-04-15 MED ORDER — SODIUM CHLORIDE 0.9 % IV BOLUS
967.0000 mL | Freq: Once | INTRAVENOUS | Status: AC
  Administered 2023-04-15: 967 mL via INTRAVENOUS

## 2023-04-15 MED ORDER — FOLIC ACID 1 MG PO TABS
1.0000 mg | ORAL_TABLET | Freq: Every day | ORAL | Status: DC
  Administered 2023-04-15 – 2023-04-16 (×2): 1 mg via ORAL
  Filled 2023-04-15 (×2): qty 1

## 2023-04-15 MED ORDER — NICOTINE 21 MG/24HR TD PT24
21.0000 mg | MEDICATED_PATCH | Freq: Every day | TRANSDERMAL | Status: DC | PRN
  Administered 2023-04-16: 21 mg via TRANSDERMAL
  Filled 2023-04-15: qty 1

## 2023-04-15 MED ORDER — SENNOSIDES-DOCUSATE SODIUM 8.6-50 MG PO TABS: 1.0000 | ORAL_TABLET | Freq: Every evening | ORAL | Status: DC | PRN

## 2023-04-15 MED ORDER — IOHEXOL 350 MG/ML SOLN
75.0000 mL | Freq: Once | INTRAVENOUS | Status: AC | PRN
  Administered 2023-04-15: 75 mL via INTRAVENOUS

## 2023-04-15 MED ORDER — THIAMINE HCL 100 MG/ML IJ SOLN
100.0000 mg | Freq: Every day | INTRAMUSCULAR | Status: DC
  Filled 2023-04-15: qty 2

## 2023-04-15 NOTE — Sepsis Progress Note (Signed)
sep

## 2023-04-15 NOTE — Assessment & Plan Note (Signed)
Ativan 1 mg as needed for seizure, alcohol withdrawal symptoms, 2 doses with instructions to let provider know
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
## 2023-04-15 NOTE — Hospital Course (Signed)
Tyler Martinez is a 52 year old male with history of hypertension, overweight, neuropathy, hyperlipidemia, non-insulin-dependent diabetes mellitus type 2, who presents emergency department for chief concerns of dizziness when standing and chest pain for 2 days.  Vitals in the ED showed temperature of 99.2, respiration rate of 18, heart rate 71, blood pressure 85/66, SpO2 of 95% on 2 L nasal cannula.  Serum sodium is 124, potassium 3.8, chloride 88, bicarb of 22, BUN of 6, serum creatinine 1.08, EGFR greater than 60, nonfasting blood glucose 134, WBC 4.1, chloride 13.8, platelets of 136.  Lactic acid was initially 2.1 and on repeat was 1.5.  High sensitive troponin was 17.  Blood cultures x 2 ordered and in process.  High sensitive troponin was 17 and on recheck was 15.  COVID PCR was negative.  ED treatment: Azithromycin 500 mg IV one-time dose, ceftriaxone 2 g IV one-time dose, sodium chloride 2.67 L bolus were ordered in the ED.

## 2023-04-15 NOTE — Sepsis Progress Note (Signed)
Notified provider and bedside nurse of need to order and administer fluid bolus, pt needs 2967 cc per protocol.

## 2023-04-15 NOTE — Assessment & Plan Note (Signed)
Home amlodipine 10 mg daily, lisinopril 40 mg daily, hydralazine 5 mg IV every 8 hours as needed for SBP greater than 175, 4 days ordered

## 2023-04-15 NOTE — ED Triage Notes (Signed)
Pt to ED via POV. Pt states that for the past 2 days he has been having chest pain, dizziness when standing. Pt states that he fell yesterday due to feeling dizzy. Pt states that he is not actively having chest pain. Pt states that he pain lasted about 30 minutes yesterday. Pt is in NAD.

## 2023-04-15 NOTE — ED Notes (Signed)
Patient transported to CT 

## 2023-04-15 NOTE — ED Notes (Signed)
Full rainbow including 1 set of blood cultures and lactic acid sent to lab.

## 2023-04-15 NOTE — Assessment & Plan Note (Addendum)
With white sputum with possible left upper lobe pneumonia/atelectasis Check procalcitonin on admission Status post azithromycin 500 mg IV daily, ceftriaxone 2 g IV daily, for 5 days, per EDP

## 2023-04-15 NOTE — ED Notes (Signed)
Provider at bedside

## 2023-04-15 NOTE — Sepsis Progress Note (Signed)
Elink monitoring for the code sepsis protocol.  

## 2023-04-15 NOTE — Assessment & Plan Note (Signed)
Presumed secondary to excessive beer intake Status post NS 2.967 L bolus ordered by EDP Discussed with nursing to stop the last bolus infusion Ordered LR infusion at 125 mL/h, 1 day ordered Goal sodium correction is 6-8 in the next 24 hours Recheck BMP in the a.m.

## 2023-04-15 NOTE — ED Notes (Signed)
Called for transport for patient 

## 2023-04-15 NOTE — Progress Notes (Signed)
CODE SEPSIS - PHARMACY COMMUNICATION  **Broad Spectrum Antibiotics should be administered within 1 hour of Sepsis diagnosis**  Time Code Sepsis Called/Page Received: 1541  Antibiotics Ordered: ceftriaxone and azithromycin  Time of 1st antibiotic administration: 1556  Additional action taken by pharmacy: None  If necessary, Name of Provider/Nurse Contacted: None   Tyler Martinez ,PharmD Clinical Pharmacist  04/15/2023  3:41 PM

## 2023-04-15 NOTE — Assessment & Plan Note (Addendum)
Counseled patient on cessation, patient states that he is bored at home.  I suggested that patient should volunteer at a soup kitchen helping those less fortunate that he is in consider joining a church and helping out his community Patient endorses understanding and states "I guess I could volunteer "

## 2023-04-15 NOTE — Sepsis Progress Note (Signed)
Notified bedside nurse of need to draw repeat lactic acid. 

## 2023-04-15 NOTE — ED Provider Notes (Signed)
[facility] Provider Note    Event Date/Time   First MD Initiated Contact with Patient 04/15/23 1341     (approximate)   History   Chest Pain and Dizziness   HPI  Tyler Martinez is a 52 y.o. male Modena Jansky to the ER for 48 hours of worsening shortness of breath cough lightheadedness and weakness.  States that whenever he is walking feels like is about to pass out.  Feels like he has been having some wheezing.  Does drink alcohol.  Denies any history of COPD.  Denies any abdominal pain.  Does not wear home oxygen.  In triage was found to be hypotensive as well as hypoxic on room air.     Physical Exam   Triage Vital Signs: ED Triage Vitals  Encounter Vitals Group     BP 04/15/23 1311 (!) 80/62     Systolic BP Percentile --      Diastolic BP Percentile --      Pulse Rate 04/15/23 1311 89     Resp 04/15/23 1311 18     Temp 04/15/23 1311 98.8 F (37.1 C)     Temp Source 04/15/23 1311 Oral     SpO2 04/15/23 1311 (!) 87 %     Weight 04/15/23 1307 218 lb (98.9 kg)     Height 04/15/23 1307 6' (1.829 m)     Head Circumference --      Peak Flow --      Pain Score 04/15/23 1307 0     Pain Loc --      Pain Education --      Exclude from Growth Chart --     Most recent vital signs: Vitals:   04/15/23 1430 04/15/23 1500  BP: 97/70 111/80  Pulse: 67 79  Resp: 20 (!) 21  Temp: 99.2 F (37.3 C)   SpO2: 100% 95%     Constitutional: Alert  Eyes: Conjunctivae are normal.  Head: Atraumatic. Nose: No congestion/rhinnorhea. Mouth/Throat: Mucous membranes are moist.   Neck: Painless ROM.  Cardiovascular:   Good peripheral circulation. Respiratory: Normal respiratory effort.  Coarse bs throughout Gastrointestinal: Soft and nontender.  Musculoskeletal:  no deformity Neurologic:  MAE spontaneously. No gross focal neurologic deficits are appreciated.  Skin:  Skin is warm, dry and intact. No rash noted. Psychiatric: Mood and affect are normal. Speech and  behavior are normal.    ED Results / Procedures / Treatments   Labs (all labs ordered are listed, but only abnormal results are displayed) Labs Reviewed  BASIC METABOLIC PANEL - Abnormal; Notable for the following components:      Result Value   Sodium 124 (*)    Chloride 88 (*)    Glucose, Bld 134 (*)    Calcium 8.0 (*)    All other components within normal limits  CBC - Abnormal; Notable for the following components:   RBC 3.54 (*)    HCT 37.2 (*)    MCV 105.1 (*)    MCH 39.0 (*)    MCHC 37.1 (*)    Platelets 136 (*)    All other components within normal limits  LACTIC ACID, PLASMA - Abnormal; Notable for the following components:   Lactic Acid, Venous 2.2 (*)    All other components within normal limits  SARS CORONAVIRUS 2 BY RT PCR  CULTURE, BLOOD (ROUTINE X 2)  CULTURE, BLOOD (ROUTINE X 2)  LACTIC ACID, PLASMA  TROPONIN I (HIGH SENSITIVITY)  TROPONIN I (HIGH SENSITIVITY)  EKG  ED ECG REPORT I, Willy Eddy, the attending physician, personally viewed and interpreted this ECG.   Date: 04/15/2023  EKG Time: 13:07  Rate: 90  Rhythm: sinus  Axis: normal  Intervals: normal  ST&T Change:  no stemi, no depressions    RADIOLOGY Please see ED Course for my review and interpretation.  I personally reviewed all radiographic images ordered to evaluate for the above acute complaints and reviewed radiology reports and findings.  These findings were personally discussed with the patient.  Please see medical record for radiology report.    PROCEDURES:  Critical Care performed: Yes, see critical care procedure note(s)  .Critical Care  Performed by: Willy Eddy, MD Authorized by: Willy Eddy, MD   Critical care provider statement:    Critical care time (minutes):  35   Critical care was necessary to treat or prevent imminent or life-threatening deterioration of the following conditions:  Respiratory failure   Critical care was time spent  personally by me on the following activities:  Ordering and performing treatments and interventions, ordering and review of laboratory studies, ordering and review of radiographic studies, pulse oximetry, re-evaluation of patient's condition, review of old charts, obtaining history from patient or surrogate, examination of patient, evaluation of patient's response to treatment, discussions with primary provider, discussions with consultants and development of treatment plan with patient or surrogate    MEDICATIONS ORDERED IN ED: Medications  cefTRIAXone (ROCEPHIN) 2 g in sodium chloride 0.9 % 100 mL IVPB (has no administration in time range)  azithromycin (ZITHROMAX) 500 mg in sodium chloride 0.9 % 250 mL IVPB (has no administration in time range)  sodium chloride 0.9 % bolus 1,000 mL (has no administration in time range)  sodium chloride 0.9 % bolus 1,000 mL (1,000 mLs Intravenous New Bag/Given 04/15/23 1420)  ipratropium-albuterol (DUONEB) 0.5-2.5 (3) MG/3ML nebulizer solution 3 mL (3 mLs Nebulization Given 04/15/23 1421)     IMPRESSION / MDM / ASSESSMENT AND PLAN / ED COURSE  I reviewed the triage vital signs and the nursing notes.                              Differential diagnosis includes, but is not limited to, Asthma, copd, CHF, pna, ptx, malignancy, Pe, anemia  Patient presenting to the ER for evaluation of symptoms as described above.  Based on symptoms, risk factors and considered above differential, this presenting complaint could reflect a potentially life-threatening illness therefore the patient will be placed on continuous pulse oximetry and telemetry for monitoring.  Laboratory evaluation will be sent to evaluate for the above complaints.      Clinical Course as of 04/15/23 1540  Mon Apr 15, 2023  1511 COVID panel is negative.  Trope negative.  Lactate is elevated.  Given patient's hypotension hypoxia will order CTA to evaluate for PE.  Will cover for pneumonia given mildly  elevated lactate.  Patient be signed out to oncoming physician pending follow-up CTA. [PR]    Clinical Course User Index [PR] Willy Eddy, MD     FINAL CLINICAL IMPRESSION(S) / ED DIAGNOSES   Final diagnoses:  Shortness of breath  Acute respiratory failure with hypoxia (HCC)     Rx / DC Orders   ED Discharge Orders     None        Note:  This document was prepared using Dragon voice recognition software and may include unintentional dictation errors.    Willy Eddy,  MD 04/15/23 1540

## 2023-04-15 NOTE — Assessment & Plan Note (Signed)
CIWA precaution initiated

## 2023-04-15 NOTE — Progress Notes (Signed)
CODE SEPSIS - PHARMACY COMMUNICATION  **Broad Spectrum Antibiotics should be administered within 1 hour of Sepsis diagnosis**  Time Code Sepsis Called/Page Received: 1512  Antibiotics Ordered: azithromycin and ceftriaxone  Time of 1st antibiotic administration: 1556  Additional action taken by pharmacy:    If necessary, Name of Provider/Nurse Contacted:      Angelique Blonder ,PharmD Clinical Pharmacist  04/15/2023  10:12 PM

## 2023-04-16 DIAGNOSIS — R42 Dizziness and giddiness: Secondary | ICD-10-CM

## 2023-04-16 DIAGNOSIS — E871 Hypo-osmolality and hyponatremia: Secondary | ICD-10-CM | POA: Diagnosis not present

## 2023-04-16 LAB — CBC
HCT: 40.3 % (ref 39.0–52.0)
Hemoglobin: 14.7 g/dL (ref 13.0–17.0)
MCH: 38.9 pg — ABNORMAL HIGH (ref 26.0–34.0)
MCHC: 36.5 g/dL — ABNORMAL HIGH (ref 30.0–36.0)
MCV: 106.6 fL — ABNORMAL HIGH (ref 80.0–100.0)
Platelets: 137 10*3/uL — ABNORMAL LOW (ref 150–400)
RBC: 3.78 MIL/uL — ABNORMAL LOW (ref 4.22–5.81)
RDW: 13.2 % (ref 11.5–15.5)
WBC: 4.4 10*3/uL (ref 4.0–10.5)
nRBC: 0 % (ref 0.0–0.2)

## 2023-04-16 LAB — ETHANOL: Alcohol, Ethyl (B): <10 mg/dL (ref <10)

## 2023-04-16 LAB — BASIC METABOLIC PANEL
Anion gap: 9 (ref 5–15)
BUN: 7 mg/dL (ref 6–20)
CO2: 27 mmol/L (ref 22–32)
Calcium: 8.6 mg/dL — ABNORMAL LOW (ref 8.9–10.3)
Chloride: 99 mmol/L (ref 98–111)
Creatinine, Ser: 0.7 mg/dL (ref 0.61–1.24)
GFR, Estimated: >60 mL/min (ref >60)
Glucose, Bld: 137 mg/dL — ABNORMAL HIGH (ref 70–99)
Potassium: 3.9 mmol/L (ref 3.5–5.1)
Sodium: 135 mmol/L (ref 135–145)

## 2023-04-16 LAB — PHOSPHORUS: Phosphorus: 3.6 mg/dL (ref 2.5–4.6)

## 2023-04-16 LAB — MAGNESIUM: Magnesium: 1.6 mg/dL — ABNORMAL LOW (ref 1.7–2.4)

## 2023-04-16 LAB — PROCALCITONIN: Procalcitonin: 0.11 ng/mL

## 2023-04-16 LAB — GLUCOSE, CAPILLARY: Glucose-Capillary: 141 mg/dL — ABNORMAL HIGH (ref 70–99)

## 2023-04-16 MED ORDER — ORAL CARE MOUTH RINSE: 15.0000 mL | OROMUCOSAL | Status: DC | PRN

## 2023-04-16 MED ORDER — ADULT MULTIVITAMIN W/MINERALS CH: 1.0000 | ORAL_TABLET | Freq: Every day | ORAL | Status: AC

## 2023-04-16 MED ORDER — ENOXAPARIN SODIUM 40 MG/0.4ML IJ SOSY: 40.0000 mg | PREFILLED_SYRINGE | INTRAMUSCULAR | Status: DC

## 2023-04-16 MED ORDER — AZITHROMYCIN 500 MG PO TABS: 500.0000 mg | ORAL_TABLET | Freq: Every day | ORAL | Status: DC

## 2023-04-16 NOTE — Progress Notes (Addendum)
1250 Dr Fran Lowes at bedside to see pt as pt began to become agitated making multiple attempts to leave off floor. Dr Fran Lowes ordered Ethanol lab and wanting  pt to call a friend to drive him home.   1300 Pt removed telebox  1525 D/c orders placed by Dr Fran Lowes. Case manager has spoke with patient. Per pt brother [name] will pick him up.   1539 D/c papers reviewed with pt. IV's removed. Pt wheeled to medical mall entrance and d/c to brother [name] in [employer] truck.

## 2023-04-16 NOTE — Care Management CC44 (Signed)
Condition Code 44 Documentation Completed  Patient Details  Name: Easton Pfuhl MRN: [id number] Date of Birth: 10-Feb-1971   Condition Code 44 given:  Yes Patient signature on Condition Code 44 notice:    Documentation of 2 MD's agreement:  Yes Code 44 added to claim:  Yes    Marlowe Sax, RN 04/16/2023, 3:29 PM

## 2023-04-16 NOTE — Evaluation (Signed)
Physical Therapy Evaluation Patient Details Name: Onyekachi Slee MRN: [id number] DOB: Nov 18, 1970 Today's Date: 04/16/2023  History of Present Illness  52 y/o male presented to ED on 04/15/23 for chest pain and dizziness with standing x 2 days along with fall. Admitted for hyponatremia. PMH: HTN, neuropathy, T2DM, alcohol dependence/abuse  Clinical Impression  Patient admitted with the above. PTA, patient lives alone and reports independence with working and driving. Patient functioning at modI level with minimal use of IV pole with hallway ambulation. Seems to be at baseline functionally and denies dizziness with mobility. No further skilled PT needs identified acutely. PT will complete orders at this time.         If plan is discharge home, recommend the following: Assist for transportation;Supervision due to cognitive status   Can travel by private vehicle        Equipment Recommendations None recommended by PT  Recommendations for Other Services       Functional Status Assessment Patient has not had a recent decline in their functional status     Precautions / Restrictions Precautions Precautions: Fall Precaution Comments: CIWA Restrictions Weight Bearing Restrictions: No      Mobility  Bed Mobility Overal bed mobility: Modified Independent                  Transfers Overall transfer level: Modified independent Equipment used: None                    Ambulation/Gait Ambulation/Gait assistance: Modified independent (Device/Increase time) Gait Distance (Feet): 180 Feet Assistive device: IV Pole Gait Pattern/deviations: WFL(Within Functional Limits) Gait velocity: decreased        Stairs            Wheelchair Mobility     Tilt Bed    Modified Rankin (Stroke Patients Only)       Balance Overall balance assessment: Mild deficits observed, not formally tested                                            Pertinent Vitals/Pain Pain Assessment Pain Assessment: No/denies pain    Home Living Family/patient expects to be discharged to:: Private residence Living Arrangements: Alone Available Help at Discharge: Family Type of Home: House Home Access: Stairs to enter Entrance Stairs-Rails: Doctor, general practice of Steps: 4   Home Layout: One level        Prior Function Prior Level of Function : Independent/Modified Independent;Working/employed;Driving                     Extremity/Trunk Assessment   Upper Extremity Assessment Upper Extremity Assessment: Overall WFL for tasks assessed    Lower Extremity Assessment Lower Extremity Assessment: Overall WFL for tasks assessed    Cervical / Trunk Assessment Cervical / Trunk Assessment: Normal  Communication   Communication Communication: No apparent difficulties  Cognition Arousal: Alert Behavior During Therapy: WFL for tasks assessed/performed Overall Cognitive Status: Within Functional Limits for tasks assessed                                          General Comments      Exercises     Assessment/Plan    PT Assessment Patient does not need any further PT services  PT Problem List         PT Treatment Interventions      PT Goals (Current goals can be found in the Care Plan section)  Acute Rehab PT Goals Patient Stated Goal: to go home PT Goal Formulation: All assessment and education complete, DC therapy    Frequency       Co-evaluation               AM-PAC PT "6 Clicks" Mobility  Outcome Measure Help needed turning from your back to your side while in a flat bed without using bedrails?: None Help needed moving from lying on your back to sitting on the side of a flat bed without using bedrails?: None Help needed moving to and from a bed to a chair (including a wheelchair)?: None Help needed standing up from a chair using your arms (e.g., wheelchair or  bedside chair)?: None Help needed to walk in hospital room?: None Help needed climbing 3-5 steps with a railing? : None 6 Click Score: 24    End of Session   Activity Tolerance: Patient tolerated treatment well Patient left: in bed;with call bell/phone within reach Nurse Communication: Mobility status PT Visit Diagnosis: Muscle weakness (generalized) (M62.81)    Time: 8657-8469 PT Time Calculation (min) (ACUTE ONLY): 10 min   Charges:   PT Evaluation $PT Eval Low Complexity: 1 Low   PT General Charges $$ ACUTE PT VISIT: 1 Visit         Maylon Peppers, PT, DPT Physical Therapist - [facility]  [facility]   Hazelgrace Bonham A Milissa Fesperman 04/16/2023, 9:53 AM

## 2023-04-16 NOTE — Progress Notes (Signed)
Dr. Fran Lowes came to see patient at bedside. CIWA score made aware. CIWA score 18, given 3 mg of Ativan. Will recheck in an hour.

## 2023-04-16 NOTE — Discharge Summary (Signed)
Physician Discharge Summary   Tyler Martinez  male DOB: 1971-03-14  XBJ:[id number]  PCP: Tyler Cords, DO  Admit date: 04/15/2023 Discharge date: 04/16/2023  Admitted From: home Disposition:  home CODE STATUS: Full code   Hospital Course:  For full details, please see H&P, progress notes, consult notes and ancillary notes.  Briefly,  [name] is a 52 year old male with history of hypertension, non-insulin-dependent diabetes mellitus type 2, alcohol abuse who presented to emergency department for chief concerns of dizziness when standing and chest pain for 2 days.   Patient states that he has felt increasing tremors and generalized weakness since the last 1 to 2 weeks.  He states he presented today because the tremors have worsened.  He reports that he normally drinks 1/5 of vodka per day, last vodka ingestion was Saturday (2 days prior to arrival).  On Sunday he had a pack of 12 bottles of beer (each bottle 12 ounce).  He states that he had beer on Sunday because the liquor store was closed.  He reports that he can only afford to buy 1/5 of vodka per day.  * Hyponatremia, chronic Presumed secondary to excessive beer intake.  Na 124 on presentation, improved with IVF to 135 the next day.  Alcohol withdrawal (HCC) Alcohol abuse CIWA initiated.  Pt appeared to be in active withdrawal with anxiety and tremors which improved with Ativan.  However, pt asked to be discharged and did not want to stay for detox.  Pt was alert, oriented x4, knew why he was in the hospital, and said that he had worked with PT and ambulated without problem.  Ethanol level checked which was <10.  Pt was able to call his brother to come pick him up, so pt was discharged.  Chest pain, ACS ruled out --trop neg x2.  No complaint of chest pain prior to discharge.  Dizziness --dizziness when standing likely due to excess alcohol use.  PT eval found pt to be at baseline function and pt denied  dizzness with mobility.   Essential hypertension --cont home amlodipine and Lisinopril   Cough With white sputum.  CTA chest showed Linear band of atelectasis in the left upper lobe.  Pt was started on ceftriaxone and azithromycin on presentation.  Procal not elevated, no leukocytosis, no fever, no hypoxia to suggest PNA.  Abx were d/c'ed.   Unless noted above, medications under "STOP" list are ones pt was not taking PTA.  Discharge Diagnoses:  Principal Problem:   Hyponatremia Active Problems:   History of substance abuse (HCC)   Alcohol dependence (HCC)   Alcohol withdrawal (HCC)   Essential hypertension   History of cerebrovascular accident (CVA) with residual deficit   Type 2 diabetes mellitus with vascular disease (HCC)   Tobacco abuse   HLD (hyperlipidemia)   Anxiety   ED (erectile dysfunction)   Insomnia   Major depressive disorder, recurrent severe without psychotic features (HCC)   Cough   Dizziness   30 Day Unplanned Readmission Risk Score    Flowsheet Row ED to Hosp-Admission (Current) from 04/15/2023 in [facility] (1A)  30 Day Unplanned Readmission Risk Score (%) 30.36 Filed at 04/16/2023 1200       This score is the patient's risk of an unplanned readmission within 30 days of being discharged (0 -100%). The score is based on dignosis, age, lab data, medications, orders, and past utilization.   Low:  0-14.9   Medium: 15-21.9   High:  22-29.9   Extreme: 30 and above         Discharge Instructions:  Allergies as of 04/16/2023       Reactions   Leflunomide Nausea And Vomiting   Methotrexate Derivatives Nausea And Vomiting   Have RA but can not tolerate any  Of the med  No components found with this name: [id number] saw Rheumatology in 2005   Other Nausea And Vomiting   Have RA but can not tolerate any  Of the med  No components found with this name: [id number] saw Rheumatology in 2005        Medication List      STOP taking these medications    ipratropium 0.06 % nasal spray Commonly known as: ATROVENT   rosuvastatin 10 MG tablet Commonly known as: CRESTOR       TAKE these medications    albuterol 108 (90 Base) MCG/ACT inhaler Commonly known as: VENTOLIN HFA Inhale 2 puffs into the lungs every 6 (six) hours as needed for wheezing or shortness of breath. What changed: when to take this   amLODipine 10 MG tablet Commonly known as: NORVASC TAKE 1 TABLET BY MOUTH EVERY DAY   folic acid 1 MG tablet Commonly known as: FOLVITE Take 1 tablet (1 mg total) by mouth daily.   gabapentin 800 MG tablet Commonly known as: NEURONTIN TAKE 1 TABLET BY MOUTH FOUR TIMES A DAY   GAS-X PO Take 3 capsules by mouth daily as needed (flatulence).   lisinopril 40 MG tablet Commonly known as: ZESTRIL TAKE 1 TABLET BY MOUTH EVERY DAY   metFORMIN 1000 MG tablet Commonly known as: GLUCOPHAGE Take 1 tablet (1,000 mg total) by mouth 2 (two) times daily with a meal.   multivitamin with minerals Tabs tablet Take 1 tablet by mouth daily. Start taking on: April 17, 2023   sodium chloride 1 g tablet TAKE 1 TABLET (1 G TOTAL) BY MOUTH 2 (TWO) TIMES DAILY WITH A MEAL.   thiamine 100 MG tablet Commonly known as: VITAMIN B1 Take 100 mg by mouth daily.   traMADol 50 MG tablet Commonly known as: ULTRAM TAKE 1 TABLET BY MOUTH EVERY 8 HOURS AS NEEDED What changed: reasons to take this         Follow-up Information     Tyler Cords, DO Follow up in 1 week(s).   Specialty: Family Medicine Contact information: [address] [place] [address] [phone]                 Allergies  Allergen Reactions   Leflunomide Nausea And Vomiting   Methotrexate Derivatives Nausea And Vomiting    Have RA but can not tolerate any  Of the med  No components found with this name: [id number] saw Rheumatology in 2005   Other Nausea And Vomiting    Have RA but can not tolerate any  Of the  med  No components found with this name: [id number] saw Rheumatology in 2005     The results of significant diagnostics from this hospitalization (including imaging, microbiology, ancillary and laboratory) are listed below for reference.   Consultations:   Procedures/Studies: CT Angio Chest PE W and/or Wo Contrast  Result Date: 04/15/2023 CLINICAL DATA:  High probability for PE.  Shortness of breath. EXAM: CT ANGIOGRAPHY CHEST WITH CONTRAST TECHNIQUE: Multidetector CT imaging of the chest was performed using the standard protocol during bolus administration of intravenous contrast. Multiplanar CT image reconstructions and MIPs were obtained to evaluate the vascular anatomy.  RADIATION DOSE REDUCTION: This exam was performed according to the departmental dose-optimization program which includes automated exposure control, adjustment of the mA and/or kV according to patient size and/or use of iterative reconstruction technique. CONTRAST:  75mL OMNIPAQUE IOHEXOL 350 MG/ML SOLN COMPARISON:  CT angiogram chest 06/26/2021 FINDINGS: Cardiovascular: Satisfactory opacification of the pulmonary arteries to the segmental level. No evidence of pulmonary embolism. Normal heart size. No pericardial effusion. Mediastinum/Nodes: No enlarged mediastinal, hilar, or axillary lymph nodes. Thyroid gland, trachea, and esophagus demonstrate no significant findings. Lungs/Pleura: There is a linear band of atelectasis in the inferior left upper lobe. The lungs are otherwise clear. Upper Abdomen: No acute abnormality. There is a low-density left adrenal nodule measuring 2.4 cm compatible with adenoma, unchanged. Musculoskeletal: Sternotomy wires are present. There are multiple healed right-sided rib fractures. Review of the MIP images confirms the above findings. IMPRESSION: 1. No evidence for pulmonary embolism. 2. Linear band of atelectasis in the left upper lobe. 3. Stable left adrenal adenoma. Electronically Signed   By: Darliss Cheney M.D.   On: 04/15/2023 17:26   DG Chest 2 View  Result Date: 04/15/2023 CLINICAL DATA:  Chest pain and dizziness. EXAM: CHEST - 2 VIEW COMPARISON:  March 10, 2023 FINDINGS: Multiple sternal wires are noted. The heart size and mediastinal contours are within normal limits. Both lungs are clear. Multiple chronic right-sided rib fractures are seen. IMPRESSION: 1. Evidence of prior median sternotomy. 2. No active cardiopulmonary disease. Electronically Signed   By: Aram Candela M.D.   On: 04/15/2023 15:51      Labs: BNP (last 3 results) Recent Labs    03/10/23 1205  BNP 117.7*   Basic Metabolic Panel: Recent Labs  Lab 04/15/23 1324 04/16/23 0354  NA 124* 135  K 3.8 3.9  CL 88* 99  CO2 22 27  GLUCOSE 134* 137*  BUN 6 7  CREATININE 1.08 0.70  CALCIUM 8.0* 8.6*  MG  --  1.6*  PHOS  --  3.6   Liver Function Tests: No results for input(s): "AST", "ALT", "ALKPHOS", "BILITOT", "PROT", "ALBUMIN" in the last 168 hours. No results for input(s): "LIPASE", "AMYLASE" in the last 168 hours. No results for input(s): "AMMONIA" in the last 168 hours. CBC: Recent Labs  Lab 04/15/23 1324 04/16/23 0354  WBC 4.1 4.4  HGB 13.8 14.7  HCT 37.2* 40.3  MCV 105.1* 106.6*  PLT 136* 137*   Cardiac Enzymes: No results for input(s): "CKTOTAL", "CKMB", "CKMBINDEX", "TROPONINI" in the last 168 hours. BNP: Invalid input(s): "POCBNP" CBG: Recent Labs  Lab 04/15/23 2131 04/16/23 0802  GLUCAP 157* 141*   D-Dimer No results for input(s): "DDIMER" in the last 72 hours. Hgb A1c No results for input(s): "HGBA1C" in the last 72 hours. Lipid Profile No results for input(s): "CHOL", "HDL", "LDLCALC", "TRIG", "CHOLHDL", "LDLDIRECT" in the last 72 hours. Thyroid function studies No results for input(s): "TSH", "T4TOTAL", "T3FREE", "THYROIDAB" in the last 72 hours.  Invalid input(s): "FREET3" Anemia work up No results for input(s): "VITAMINB12", "FOLATE", "FERRITIN", "TIBC", "IRON",  "RETICCTPCT" in the last 72 hours. Urinalysis    Component Value Date/Time   COLORURINE AMBER (A) 03/10/2023 1110   APPEARANCEUR CLEAR (A) 03/10/2023 1110   LABSPEC 1.012 03/10/2023 1110   PHURINE 6.0 03/10/2023 1110   GLUCOSEU 50 (A) 03/10/2023 1110   HGBUR SMALL (A) 03/10/2023 1110   BILIRUBINUR NEGATIVE 03/10/2023 1110   KETONESUR NEGATIVE 03/10/2023 1110   PROTEINUR 30 (A) 03/10/2023 1110   NITRITE NEGATIVE 03/10/2023 1110  LEUKOCYTESUR NEGATIVE 03/10/2023 1110   Sepsis Labs Recent Labs  Lab 04/15/23 1324 04/16/23 0354  WBC 4.1 4.4   Microbiology Recent Results (from the past 240 hour(s))  Blood culture (routine x 2)     Status: None (Preliminary result)   Collection Time: 04/15/23  1:24 PM   Specimen: BLOOD  Result Value Ref Range Status   Specimen Description BLOOD RIGTH [facility]  Final   Special Requests   Final    BOTTLES DRAWN AEROBIC AND ANAEROBIC Blood Culture adequate volume   Culture   Final    NO GROWTH < 24 HOURS Performed at [facility], [address] [place], [place] [address]    Report Status PENDING  Incomplete  SARS Coronavirus 2 by RT PCR (hospital order, performed in [facility] hospital lab) *cepheid single result test* Anterior Nasal Swab     Status: None   Collection Time: 04/15/23  2:10 PM   Specimen: Anterior Nasal Swab  Result Value Ref Range Status   SARS Coronavirus 2 by RT PCR NEGATIVE NEGATIVE Final    Comment: (NOTE) SARS-CoV-2 target nucleic acids are NOT DETECTED.  The SARS-CoV-2 RNA is generally detectable in upper and lower respiratory specimens during the acute phase of infection. The lowest concentration of SARS-CoV-2 viral copies this assay can detect is 250 copies / mL. A negative result does not preclude SARS-CoV-2 infection and should not be used as the sole basis for treatment or other patient management decisions.  A negative result may occur with improper specimen collection / handling, submission of specimen  other than nasopharyngeal swab, presence of viral mutation(s) within the areas targeted by this assay, and inadequate number of viral copies (<250 copies / mL). A negative result must be combined with clinical observations, patient history, and epidemiological information.  Fact Sheet for Patients:   RoadLapTop.co.za  Fact Sheet for Healthcare Providers: http://kim-miller.com/  This test is not yet approved or  cleared by the [place] FDA and has been authorized for detection and/or diagnosis of SARS-CoV-2 by FDA under an Emergency Use Authorization (EUA).  This EUA will remain in effect (meaning this test can be used) for the duration of the COVID-19 declaration under Section 564(b)(1) of the Act, 21 U.S.C. section 360bbb-3(b)(1), unless the authorization is terminated or revoked sooner.  Performed at [facility], [address] [place], [place] [address]   Blood culture (routine x 2)     Status: None (Preliminary result)   Collection Time: 04/15/23  2:10 PM   Specimen: BLOOD  Result Value Ref Range Status   Specimen Description BLOOD BLOOD LEFT ARM  Final   Special Requests   Final    BOTTLES DRAWN AEROBIC AND ANAEROBIC Blood Culture adequate volume   Culture   Final    NO GROWTH < 24 HOURS Performed at [facility], [address], [place], [place] [address]    Report Status PENDING  Incomplete     Total time spend on discharging this patient, including the last patient exam, discussing the hospital stay, instructions for ongoing care as it relates to all pertinent caregivers, as well as preparing the medical discharge records, prescriptions, and/or referrals as applicable, is 45 minutes.    Darlin Priestly, MD  [facility] 04/16/2023, 3:26 PM

## 2023-04-16 NOTE — Plan of Care (Signed)
  Problem: Clinical Measurements: Goal: Ability to maintain clinical measurements within normal limits will improve Outcome: Progressing Goal: Will remain free from infection Outcome: Progressing Goal: Diagnostic test results will improve Outcome: Progressing Goal: Respiratory complications will improve Outcome: Progressing Goal: Cardiovascular complication will be avoided Outcome: Progressing   Problem: Pain Managment: Goal: General experience of comfort will improve Outcome: Progressing   Problem: Safety: Goal: Ability to remain free from injury will improve Outcome: Progressing   Problem: Skin Integrity: Goal: Risk for impaired skin integrity will decrease Outcome: Progressing

## 2023-04-16 NOTE — Plan of Care (Signed)
  Problem: Education: Goal: Knowledge of General Education information will improve Description: Including pain rating scale, medication(s)/side effects and non-pharmacologic comfort measures Outcome: Progressing   Problem: Activity: Goal: Risk for activity intolerance will decrease Outcome: Progressing   Problem: Nutrition: Goal: Adequate nutrition will be maintained Outcome: Progressing   

## 2023-04-16 NOTE — Progress Notes (Signed)
Assumed care of this patient from ED at 0040. Patient is alert and oriented. On RA, breathing is even and unlabored.Denies pain at this time. Skin assessment done. Patient was oriented to room and call bell placed within reach.

## 2023-04-16 NOTE — TOC Progression Note (Signed)
Transition of Care [facility]) - Progression Note    Patient Details  Name: Tyler Martinez MRN: [id number] Date of Birth: 1970-09-03  Transition of Care [facility]) CM/SW Contact  Marlowe Sax, RN Phone Number: 04/16/2023, 11:48 AM  Clinical Narrative:    Transition of Care [facility]) - Inpatient Brief Assessment   Patient Details  Name: Tyler Martinez MRN: [id number] Date of Birth: [birth year]  Transition of Care [facility]) CM/SW Contact:    Marlowe Sax, RN Phone Number: 04/16/2023, 11:48 AM   Clinical Narrative:    Transition of Care Asessment: Insurance and Status: Insurance coverage has been reviewed Patient has primary care physician: Yes Althea Charon, Netta Neat, DO    Provider Role General - Family Medicine  Office Phone [phone]) Home environment has been reviewed: yes Prior level of function:: independent Prior/Current Home Services: No current home services Social Determinants of Health Reivew: SDOH reviewed interventions complete (Addedd Substance use resources) Readmission risk has been reviewed: Yes Transition of care needs: no transition of care needs at this time         Expected Discharge Plan and Services                                               Social Determinants of Health (SDOH) Interventions SDOH Screenings   Food Insecurity: Food Insecurity Present (04/16/2023)  Housing: Medium Risk (04/16/2023)  Transportation Needs: No Transportation Needs (04/16/2023)  Utilities: Not At Risk (04/16/2023)  Alcohol Screen: Low Risk  (10/25/2021)  Depression (PHQ2-9): Medium Risk (03/09/2022)  Financial Resource Strain: Low Risk  (10/25/2021)  Physical Activity: Sufficiently Active (10/25/2021)  Social Connections: Unknown (01/07/2022)   Received from [facility]  Recent Concern: Social Connections - Moderately Isolated (10/25/2021)  Stress: No Stress Concern Present (10/25/2021)  Tobacco Use: High Risk (04/15/2023)    Readmission  Risk Interventions     No data to display

## 2023-04-16 NOTE — Consult Note (Signed)
PHARMACIST - PHYSICIAN COMMUNICATION DR: Darlin Priestly, MD CONCERNING: Antibiotic IV to Oral Route Change Policy  RECOMMENDATION: This patient is receiving azithromycin by the intravenous route.  Based on criteria approved by the Pharmacy and Therapeutics Committee, the antibiotic(s) is/are being converted to the equivalent oral dose form(s).  DESCRIPTION: These criteria include: Patient being treated for a respiratory tract infection, urinary tract infection, cellulitis or clostridium difficile associated diarrhea if on metronidazole The patient is not neutropenic and does not exhibit a GI malabsorption state The patient is eating (either orally or via tube) and/or has been taking other orally administered medications for a least 24 hours The patient is improving clinically and has a Tmax < 100.5  If you have questions about this conversion, please contact the Pharmacy Department  []   [phone] )  [facility] []   [phone] )  [facility]  []   [phone] )  [facility] []   [phone] )  [facility]  [x]   [phone] )  [facility]   Celene Squibb, PharmD Clinical Pharmacist 04/16/2023 7:45 AM

## 2023-04-16 NOTE — Discharge Instructions (Signed)

## 2023-04-18 ENCOUNTER — Other Ambulatory Visit (INDEPENDENT_AMBULATORY_CARE_PROVIDER_SITE_OTHER)
Admission: EM | Admit: 2023-04-18 | Discharge: 2023-04-23 | Disposition: A | Discharge status: Other Acute Inpatient Hospital | Payer: Medicare Other | Source: Home / Self Care

## 2023-04-18 ENCOUNTER — Encounter (HOSPITAL_COMMUNITY): Payer: Self-pay

## 2023-04-18 ENCOUNTER — Ambulatory Visit (HOSPITAL_COMMUNITY)
Admission: EM | Admit: 2023-04-18 | Discharge: 2023-04-18 | Disposition: A | Discharge status: Other Acute Inpatient Hospital | Payer: Medicare Other | Attending: Family | Admitting: Family

## 2023-04-18 DIAGNOSIS — Z720 Tobacco use: Secondary | ICD-10-CM | POA: Insufficient documentation

## 2023-04-18 DIAGNOSIS — F1020 Alcohol dependence, uncomplicated: Secondary | ICD-10-CM | POA: Insufficient documentation

## 2023-04-18 DIAGNOSIS — I10 Essential (primary) hypertension: Secondary | ICD-10-CM | POA: Insufficient documentation

## 2023-04-18 DIAGNOSIS — Z9151 Personal history of suicidal behavior: Secondary | ICD-10-CM | POA: Diagnosis not present

## 2023-04-18 DIAGNOSIS — F1090 Alcohol use, unspecified, uncomplicated: Secondary | ICD-10-CM | POA: Diagnosis not present

## 2023-04-18 DIAGNOSIS — F1994 Other psychoactive substance use, unspecified with psychoactive substance-induced mood disorder: Secondary | ICD-10-CM | POA: Insufficient documentation

## 2023-04-18 DIAGNOSIS — F17200 Nicotine dependence, unspecified, uncomplicated: Secondary | ICD-10-CM | POA: Insufficient documentation

## 2023-04-18 DIAGNOSIS — F332 Major depressive disorder, recurrent severe without psychotic features: Secondary | ICD-10-CM | POA: Diagnosis not present

## 2023-04-18 DIAGNOSIS — Z800 Family history of malignant neoplasm of digestive organs: Secondary | ICD-10-CM | POA: Insufficient documentation

## 2023-04-18 LAB — POCT URINE DRUG SCREEN - MANUAL ENTRY (I-SCREEN)
POC Amphetamine UR: NOT DETECTED
POC Buprenorphine (BUP): NOT DETECTED
POC Cocaine UR: NOT DETECTED
POC Marijuana UR: NOT DETECTED
POC Methadone UR: NOT DETECTED
POC Methamphetamine UR: NOT DETECTED
POC Morphine: NOT DETECTED
POC Oxazepam (BZO): POSITIVE — AB
POC Oxycodone UR: NOT DETECTED
POC Secobarbital (BAR): NOT DETECTED

## 2023-04-18 LAB — COMPREHENSIVE METABOLIC PANEL
ALT: 86 U/L — ABNORMAL HIGH (ref 0–44)
AST: 138 U/L — ABNORMAL HIGH (ref 15–41)
Albumin: 3.4 g/dL — ABNORMAL LOW (ref 3.5–5.0)
Alkaline Phosphatase: 90 U/L (ref 38–126)
Anion gap: 18 — ABNORMAL HIGH (ref 5–15)
BUN: <5 mg/dL — ABNORMAL LOW (ref 6–20)
CO2: 22 mmol/L (ref 22–32)
Calcium: 8.7 mg/dL — ABNORMAL LOW (ref 8.9–10.3)
Chloride: 87 mmol/L — ABNORMAL LOW (ref 98–111)
Creatinine, Ser: 0.6 mg/dL — ABNORMAL LOW (ref 0.61–1.24)
GFR, Estimated: >60 mL/min (ref >60)
Glucose, Bld: 118 mg/dL — ABNORMAL HIGH (ref 70–99)
Potassium: 3.6 mmol/L (ref 3.5–5.1)
Sodium: 127 mmol/L — ABNORMAL LOW (ref 135–145)
Total Bilirubin: 0.7 mg/dL (ref 0.3–1.2)
Total Protein: 6 g/dL — ABNORMAL LOW (ref 6.5–8.1)

## 2023-04-18 LAB — CBC WITH DIFFERENTIAL/PLATELET
Abs Immature Granulocytes: 0 10*3/uL (ref 0.00–0.07)
Basophils Absolute: 0 10*3/uL (ref 0.0–0.1)
Basophils Relative: 0 %
Eosinophils Absolute: 0 10*3/uL (ref 0.0–0.5)
Eosinophils Relative: 1 %
Hemoglobin: 15 g/dL (ref 13.0–17.0)
Lymphocytes Relative: 17 %
Lymphs Abs: 0.6 10*3/uL — ABNORMAL LOW (ref 0.7–4.0)
Monocytes Absolute: 0.7 10*3/uL (ref 0.1–1.0)
Monocytes Relative: 18 %
Neutro Abs: 2.4 10*3/uL (ref 1.7–7.7)
Neutrophils Relative %: 64 %
Platelets: 201 10*3/uL (ref 150–400)
WBC: 3.7 10*3/uL — ABNORMAL LOW (ref 4.0–10.5)
nRBC: 0 % (ref 0.0–0.2)

## 2023-04-18 LAB — MAGNESIUM: Magnesium: 1.4 mg/dL — ABNORMAL LOW (ref 1.7–2.4)

## 2023-04-18 LAB — ETHANOL: Alcohol, Ethyl (B): 125 mg/dL — ABNORMAL HIGH (ref <10)

## 2023-04-18 LAB — TSH: TSH: 2.888 u[IU]/mL (ref 0.350–4.500)

## 2023-04-18 MED ORDER — LOPERAMIDE HCL 2 MG PO CAPS: 2.0000 mg | ORAL_CAPSULE | ORAL | Status: DC | PRN

## 2023-04-18 MED ORDER — ADULT MULTIVITAMIN W/MINERALS CH: 1.0000 | ORAL_TABLET | Freq: Every day | ORAL | Status: DC

## 2023-04-18 MED ORDER — LORAZEPAM 1 MG PO TABS
1.0000 mg | ORAL_TABLET | Freq: Once | ORAL | Status: AC
  Administered 2023-04-20: 1 mg via ORAL

## 2023-04-18 MED ORDER — NICOTINE POLACRILEX 2 MG MT GUM: 2.0000 mg | CHEWING_GUM | OROMUCOSAL | Status: DC | PRN

## 2023-04-18 MED ORDER — DIPHENHYDRAMINE HCL 50 MG/ML IJ SOLN: 25.0000 mg | Freq: Four times a day (QID) | INTRAMUSCULAR | Status: DC | PRN

## 2023-04-18 MED ORDER — HYDROXYZINE HCL 25 MG PO TABS
25.0000 mg | ORAL_TABLET | Freq: Three times a day (TID) | ORAL | Status: DC | PRN
  Administered 2023-04-19 – 2023-04-22 (×3): 25 mg via ORAL
  Filled 2023-04-18 (×4): qty 1

## 2023-04-18 MED ORDER — NICOTINE 14 MG/24HR TD PT24
14.0000 mg | MEDICATED_PATCH | Freq: Every day | TRANSDERMAL | Status: DC
  Administered 2023-04-18 – 2023-04-23 (×6): 14 mg via TRANSDERMAL
  Filled 2023-04-18 (×6): qty 1

## 2023-04-18 MED ORDER — BISMUTH SUBSALICYLATE 262 MG PO CHEW
524.0000 mg | CHEWABLE_TABLET | ORAL | Status: DC | PRN
  Administered 2023-04-18 – 2023-04-20 (×2): 524 mg via ORAL
  Filled 2023-04-18 (×2): qty 2

## 2023-04-18 MED ORDER — ALUM & MAG HYDROXIDE-SIMETH 200-200-20 MG/5ML PO SUSP: 30.0000 mL | ORAL | Status: DC | PRN

## 2023-04-18 MED ORDER — NICOTINE 14 MG/24HR TD PT24: 14.0000 mg | MEDICATED_PATCH | Freq: Every day | TRANSDERMAL | Status: DC

## 2023-04-18 MED ORDER — ADULT MULTIVITAMIN W/MINERALS CH
1.0000 | ORAL_TABLET | Freq: Every day | ORAL | Status: DC
  Administered 2023-04-18 – 2023-04-23 (×6): 1 via ORAL
  Filled 2023-04-18 (×6): qty 1

## 2023-04-18 MED ORDER — SENNA 8.6 MG PO TABS: 1.0000 | ORAL_TABLET | Freq: Every evening | ORAL | Status: DC | PRN

## 2023-04-18 MED ORDER — THIAMINE MONONITRATE 100 MG PO TABS
100.0000 mg | ORAL_TABLET | Freq: Every day | ORAL | Status: DC
  Administered 2023-04-18 – 2023-04-23 (×6): 100 mg via ORAL
  Filled 2023-04-18 (×6): qty 1

## 2023-04-18 MED ORDER — ONDANSETRON 4 MG PO TBDP: 4.0000 mg | ORAL_TABLET | Freq: Four times a day (QID) | ORAL | Status: DC | PRN

## 2023-04-18 MED ORDER — THIAMINE HCL 100 MG/ML IJ SOLN: 100.0000 mg | Freq: Once | INTRAMUSCULAR | Status: DC

## 2023-04-18 MED ORDER — LORAZEPAM 1 MG PO TABS
1.0000 mg | ORAL_TABLET | ORAL | Status: DC | PRN
  Administered 2023-04-18: 2 mg via ORAL
  Filled 2023-04-18: qty 2

## 2023-04-18 MED ORDER — MAGNESIUM HYDROXIDE 400 MG/5ML PO SUSP: 30.0000 mL | Freq: Every day | ORAL | Status: DC | PRN

## 2023-04-18 MED ORDER — ACETAMINOPHEN 325 MG PO TABS: 650.0000 mg | ORAL_TABLET | Freq: Four times a day (QID) | ORAL | Status: DC | PRN

## 2023-04-18 MED ORDER — POLYETHYLENE GLYCOL 3350 17 G PO PACK: 17.0000 g | PACK | Freq: Every day | ORAL | Status: DC | PRN

## 2023-04-18 MED ORDER — LISINOPRIL 20 MG PO TABS
40.0000 mg | ORAL_TABLET | Freq: Every day | ORAL | Status: DC
  Administered 2023-04-19 – 2023-04-23 (×5): 40 mg via ORAL
  Filled 2023-04-18 (×5): qty 2

## 2023-04-18 MED ORDER — ONDANSETRON HCL 4 MG PO TABS: 8.0000 mg | ORAL_TABLET | Freq: Three times a day (TID) | ORAL | Status: DC | PRN

## 2023-04-18 MED ORDER — LORAZEPAM 1 MG PO TABS: 1.0000 mg | ORAL_TABLET | Freq: Four times a day (QID) | ORAL | Status: DC | PRN

## 2023-04-18 MED ORDER — TRAZODONE HCL 50 MG PO TABS: 50.0000 mg | ORAL_TABLET | Freq: Every evening | ORAL | Status: DC | PRN

## 2023-04-18 MED ORDER — GABAPENTIN 400 MG PO CAPS
800.0000 mg | ORAL_CAPSULE | Freq: Four times a day (QID) | ORAL | Status: DC
  Administered 2023-04-18 – 2023-04-23 (×20): 800 mg via ORAL
  Filled 2023-04-18 (×20): qty 2

## 2023-04-18 MED ORDER — AMLODIPINE BESYLATE 10 MG PO TABS: 10.0000 mg | ORAL_TABLET | Freq: Every day | ORAL | Status: DC

## 2023-04-18 MED ORDER — ALBUTEROL SULFATE HFA 108 (90 BASE) MCG/ACT IN AERS: 2.0000 | INHALATION_SPRAY | Freq: Four times a day (QID) | RESPIRATORY_TRACT | Status: DC | PRN

## 2023-04-18 MED ORDER — LORAZEPAM 1 MG PO TABS
1.0000 mg | ORAL_TABLET | Freq: Four times a day (QID) | ORAL | Status: AC
  Administered 2023-04-18 – 2023-04-19 (×4): 1 mg via ORAL
  Filled 2023-04-18 (×4): qty 1

## 2023-04-18 MED ORDER — TRAZODONE HCL 50 MG PO TABS
50.0000 mg | ORAL_TABLET | Freq: Every evening | ORAL | Status: DC | PRN
  Administered 2023-04-20 – 2023-04-22 (×3): 50 mg via ORAL
  Filled 2023-04-18 (×4): qty 1

## 2023-04-18 MED ORDER — LORAZEPAM 2 MG/ML IJ SOLN: 1.0000 mg | INTRAMUSCULAR | Status: DC | PRN

## 2023-04-18 MED ORDER — AMLODIPINE BESYLATE 10 MG PO TABS
10.0000 mg | ORAL_TABLET | Freq: Every day | ORAL | Status: DC
  Administered 2023-04-19 – 2023-04-23 (×5): 10 mg via ORAL
  Filled 2023-04-18: qty 1
  Filled 2023-04-18: qty 14
  Filled 2023-04-18 (×4): qty 1

## 2023-04-18 MED ORDER — LORAZEPAM 1 MG PO TABS
1.0000 mg | ORAL_TABLET | Freq: Three times a day (TID) | ORAL | Status: AC
  Administered 2023-04-19 – 2023-04-20 (×3): 1 mg via ORAL
  Filled 2023-04-18 (×3): qty 1

## 2023-04-18 MED ORDER — HALOPERIDOL LACTATE 5 MG/ML IJ SOLN: 5.0000 mg | Freq: Four times a day (QID) | INTRAMUSCULAR | Status: DC | PRN

## 2023-04-18 MED ORDER — LORAZEPAM 2 MG/ML IJ SOLN: 1.0000 mg | Freq: Four times a day (QID) | INTRAMUSCULAR | Status: DC | PRN

## 2023-04-18 MED ORDER — ACETAMINOPHEN 325 MG PO TABS
650.0000 mg | ORAL_TABLET | Freq: Four times a day (QID) | ORAL | Status: DC | PRN
  Administered 2023-04-18 – 2023-04-22 (×5): 650 mg via ORAL
  Filled 2023-04-18 (×5): qty 2

## 2023-04-18 MED ORDER — HYDROXYZINE HCL 25 MG PO TABS: 25.0000 mg | ORAL_TABLET | Freq: Three times a day (TID) | ORAL | Status: DC | PRN

## 2023-04-18 MED ORDER — METFORMIN HCL 500 MG PO TABS
1000.0000 mg | ORAL_TABLET | Freq: Two times a day (BID) | ORAL | Status: DC
  Administered 2023-04-18 – 2023-04-23 (×10): 1000 mg via ORAL
  Filled 2023-04-18 (×10): qty 2

## 2023-04-18 MED ORDER — THIAMINE MONONITRATE 100 MG PO TABS: 100.0000 mg | ORAL_TABLET | Freq: Every day | ORAL | Status: DC

## 2023-04-18 MED ORDER — HALOPERIDOL 5 MG PO TABS: 5.0000 mg | ORAL_TABLET | Freq: Four times a day (QID) | ORAL | Status: DC | PRN

## 2023-04-18 MED ORDER — HYDROXYZINE HCL 25 MG PO TABS
50.0000 mg | ORAL_TABLET | ORAL | Status: AC
  Administered 2023-04-18: 50 mg via ORAL
  Filled 2023-04-18: qty 2

## 2023-04-18 MED ORDER — LORAZEPAM 1 MG PO TABS
1.0000 mg | ORAL_TABLET | Freq: Two times a day (BID) | ORAL | Status: AC
  Administered 2023-04-20 – 2023-04-21 (×2): 1 mg via ORAL
  Filled 2023-04-18 (×2): qty 1

## 2023-04-18 MED ORDER — DIPHENHYDRAMINE HCL 25 MG PO CAPS: 25.0000 mg | ORAL_CAPSULE | Freq: Four times a day (QID) | ORAL | Status: DC | PRN

## 2023-04-18 MED ORDER — LISINOPRIL 20 MG PO TABS: 40.0000 mg | ORAL_TABLET | Freq: Every day | ORAL | Status: DC

## 2023-04-18 NOTE — ED Provider Notes (Signed)
Facility Based Crisis Admission H&P  Date: 04/18/23 Patient Name: Tyler Martinez MRN: [id number] Chief Complaint: "detox from alcohol"  Diagnoses:  Final diagnoses:  Alcohol use disorder  Tobacco use disorder   HPI:  Tyler Martinez is a 52 y.o., male with no past psychiatric history and substance use history of alcohol use disorder, severe, stimulant use disorder, and tobacco use disorder  who presents to the Facility Based Crisis center from Facility-Based Crisis center for evaluation and management of alcohol detox and management of withdrawal symptoms.   He decided to come to [facility] to quit drinking and will do "whatever it takes" to stop. He says his brother is supportive and was the one who brought him here.  Patient reports he has been drinking for 12-20 years with several unintentional sober periods. He says he drinks about 1/5th of vodka from Mondays through Saturdays and several beers on Sundays. He says he drinks because "I am alone" and also to help him sleep. He endorses tremors during withdrawal periods in the past but denies seizures, hospitalization because of alcohol withdrawals, or hallucinations. At present he endorses tremors. His reported last drink was sometime this morning.  He also endorses over 10 years of smoking tobacco, about 1.5 packs per day. He admits to being prescribed ADHD medications in the past which he reportedly abused.  Patient's goal is to get to a residential rehab facility closer to where he lives [facility]).  He denies any historical psychiatric diagnoses.  PHQ 2-9:  Flowsheet Row ED from 04/18/2023 in [facility] Most recent reading at 04/18/2023  2:39 PM ED from 04/18/2023 in [facility] Most recent reading at 04/18/2023  2:18 PM Office Visit from 03/09/2022 in [facility] Most recent reading at 03/09/2022  1:49 PM  Thoughts that you would be better off dead, or  of hurting yourself in some way Not at all Not at all Not at all  PHQ-9 Total Score 16 16 8        Flowsheet Row ED from 04/18/2023 in [facility] Most recent reading at 04/18/2023  2:56 PM ED from 04/18/2023 in [facility] Most recent reading at 04/18/2023  1:41 PM ED to Hosp-Admission (Discharged) from 04/15/2023 in [facility] ([facility]A) Most recent reading at 04/16/2023  2:00 AM  C-SSRS RISK CATEGORY No Risk No Risk No Risk        Total Time spent with patient: 1 hour  Musculoskeletal  Strength & Muscle Tone: within normal limits Gait & Station: normal Patient leans: N/A  Psychiatric Specialty Exam  Presentation General Appearance: Casual   Eye Contact:Good   Speech:Clear and Coherent; Normal Rate   Speech Volume:Normal   Handedness:Right   Mood and Affect  Mood:-- ("I'm alright")   Affect:Appropriate; Congruent; Full Range   Thought Process  Thought Processes:Coherent; Linear; Goal Directed   Descriptions of Associations:Intact   Orientation:Full (Time, Place and Person)   Thought Content:Logical; WDL  Diagnosis of Schizophrenia or Schizoaffective disorder in past: No    Hallucinations:Hallucinations: None   Ideas of Reference:None   Suicidal Thoughts:Suicidal Thoughts: No   Homicidal Thoughts:Homicidal Thoughts: No   Sensorium  Memory:Immediate Good; Recent Good; Remote Good   Judgment:Good   Insight:Fair   Executive Functions  Concentration:Good   Attention Span:Good   Recall:Good   Fund of Knowledge:Good   Language:Good   Psychomotor Activity  Psychomotor Activity:Psychomotor Activity: Normal  Assets  Assets:Resilience   Sleep  Sleep:Sleep: Good Number of Hours of Sleep: 8   Nutritional Assessment (For OBS and FBC admissions only) Has the patient had a weight loss or gain of 10 pounds or more in the last 3 months?:  No Has the patient had a decrease in food intake/or appetite?: No Does the patient have dental problems?: No Does the patient have eating habits or behaviors that may be indicators of an eating disorder including binging or inducing vomiting?: No Has the patient recently lost weight without trying?: 0 Has the patient been eating poorly because of a decreased appetite?: 0 Malnutrition Screening Tool Score: 0    Physical Exam Vitals and nursing note reviewed.  HENT:     Head: Normocephalic and atraumatic.  Pulmonary:     Effort: Pulmonary effort is normal.  Musculoskeletal:     Cervical back: Normal range of motion.  Neurological:     General: No focal deficit present.     Mental Status: He is alert. Mental status is at baseline.    Review of Systems  Constitutional: Negative.   Respiratory: Negative.    Cardiovascular: Negative.   Gastrointestinal: Negative.   Genitourinary: Negative.   Neurological:  Positive for tremors.  Psychiatric/Behavioral:         Psychiatric subjective data addressed in PSE or HPI / daily subjective report   There were no vitals taken for this visit. There is no height or weight on file to calculate BMI.  Past Psychiatric History: denies   Is the patient at risk to self? No Has the patient been a risk to self in the past 6 months? No Has the patient been a risk to self within the distant past? Unknown Is the patient a risk to others? No Has the patient been a risk to others in the past 6 months? No Has the patient been a risk to others within the distant past? Unknown  Past Medical History: HTN, diabetes Family History: brother was alcoholic but quit Social History: patient is close to his brother. They both live in [place], [place]. 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  Last Labs:     Latest Ref Rng & Units 04/16/2023    3:54 AM 04/15/2023    1:24 PM 03/11/2023    6:08 PM  CMP  Glucose 70  - 99 mg/dL 132  440  102   BUN 6 - 20 mg/dL 7  6  9    Creatinine 0.61 - 1.24 mg/dL 7.25  3.66  4.40   Sodium 135 - 145 mmol/L 135  124  132   Potassium 3.5 - 5.1 mmol/L 3.9  3.8  3.5   Chloride 98 - 111 mmol/L 99  88  100   CO2 22 - 32 mmol/L 27  22  22    Calcium 8.9 - 10.3 mg/dL 8.6  8.0  8.4   Total Protein 6.5 - 8.1 g/dL   6.4   Total Bilirubin 0.3 - 1.2 mg/dL   1.5   Alkaline Phos 38 - 126 U/L   72   AST 15 - 41 U/L   57   ALT 0 - 44 U/L   40   CBC    Component Value Date/Time   WBC 4.4 04/16/2023 0354   RBC 3.78 (L) 04/16/2023 0354   HGB 14.7 04/16/2023 0354   HCT 40.3 04/16/2023 0354   PLT 137 (L) 04/16/2023 0354  MCV 106.6 (H) 04/16/2023 0354   MCH 38.9 (H) 04/16/2023 0354   MCHC 36.5 (H) 04/16/2023 0354   RDW 13.2 04/16/2023 0354   LYMPHSABS 0.6 (L) 03/11/2023 1616   MONOABS 0.4 03/11/2023 1616   EOSABS 0.0 03/11/2023 1616   BASOSABS 0.0 03/11/2023 1616    Allergies: Leflunomide and Methotrexate derivatives  PTA Medications: (Not in a hospital admission)   Long Term Goals: Improvement in symptoms so as ready for discharge  Short Term Goals: Patient will verbalize feelings in meetings with treatment team members. and Patient will take medications as prescribed daily.  Medical Decision Making               -- CIWA with scheduled and as needed lorazepam protocol              -- medical regimen: gabapentin for pain, metformin for diabetes, amlodipine and lisinopril for HTN -- Patient in need of nicotine replacement; nicotine polacrilex (gum) and nicotine patch 14 mg / 24 hours ordered. Smoking cessation encouraged  PRNs              -- start acetaminophen 650 mg every 6 hours as needed for mild to moderate pain, fever, and headaches              -- start hydroxyzine 25 mg three times a day as needed for anxiety              -- start bismuth subsalicylate 524 mg oral chewable tablet every 3 hours as needed for diarrhea / loose stools              -- start senna  8.6 mg oral at bedtime and polyethylene glycol 17 g oral daily as needed for mild to moderate constipation              -- start ondansetron 8 mg every 8 hours as needed for nausea or vomiting              -- start aluminum-magnesium hydroxide + simethicone 30 mL every 4 hours as needed for heartburn or indigestion              -- start melatonin 3 mg bedtime as needed for insomnia  -- As needed agitation protocol in-place  Recommendations  Based on my evaluation the patient does not appear to have an emergency medical condition.  Augusto Gamble, MD 04/18/23  3:00 PM
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
## 2023-04-18 NOTE — Progress Notes (Signed)
   04/18/23 1302  [facility] Triage Screening (Walk-ins at [facility] only)  How Did You Hear About [place]? Family/Friend  What Is the Reason for Your Visit/Call Today? Pt presents to [facility] voluntarily acccompanied by his brother seeking alcohol detox. Pt reports being discharged from [facility] 2 days ago for alcohol detox. Pt reports continueing to drink after his admission, his last drink was this morning a 16oz Mike's Hard Lemonade. Pt reports drinking rougly a fifth of liquor daily.Pt reports withdrawal symptoms when he does not drink enough,shaking, diarrhea, nausea. Currently he is reporting nausea and possible diarrhea. Pt denies SI/HI and AVH.  How Long Has This Been Causing You Problems? > than 6 months  Have You Recently Had Any Thoughts About Hurting Yourself? No  Are You Planning to Commit Suicide/Harm Yourself At This time? No  Have you Recently Had Thoughts About Hurting Someone [name]? No  Are You Planning To Harm Someone At This Time? No  Are you currently experiencing any auditory, visual or other hallucinations? No  Have You Used Any Alcohol or Drugs in the Past 24 Hours? Yes  How long ago did you use Drugs or Alcohol? today  What Did You Use and How Much? 16oz Mikes Hard Lemonade  Do you have any current medical co-morbidities that require immediate attention? No  Clinician description of patient physical appearance/behavior: nervous, cooperative, fairly groomed  What Do You Feel Would Help You the Most Today? Alcohol or Drug Use Treatment  If access to [facility] was not available, would you have sought care in the Emergency Department? No  Determination of Need Urgent (48 hours)  Options For Referral Facility-Based Crisis

## 2023-04-18 NOTE — ED Notes (Signed)
Pt was provided dinner.

## 2023-04-18 NOTE — ED Notes (Signed)
Patient observed/assessed at nursing station. Patient alert and oriented x 4. Affect is bright. Patient denies pain and anxiety. He denies A/V/H. He denies having any thoughts/plan of self harm and harm towards others. Fluid and snack offered. Patient states that appetite has been good throughout the day. Pt having tremors and stated being anxious and believes it is part of the withdrawals symptoms. Verbalizes no further complaints at this time. Will continue to monitor and support.
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
## 2023-04-18 NOTE — Group Note (Signed)
Group Topic: Positive Affirmations  Group Date: 04/18/2023 Start Time: 2000 End Time: 2045 Facilitators: Guss Bunde  Department: [facility]  Number of Participants: 6  Group Focus: affirmation Treatment Modality:  Leisure Development Interventions utilized were group exercise Purpose: relapse prevention strategies  Name: Tyler Martinez Date of Birth: 01-08-1971  MR: [id number]    Level of Participation: active Quality of Participation: attentive Interactions with others: gave feedback Mood/Affect: appropriate Triggers (if applicable):  Cognition: goal directed Progress: Gaining insight Response:  Plan: patient will be encouraged to complete goals  Patients Problems:  Patient Active Problem List   Diagnosis Date Noted   Dizziness 04/16/2023   Cough 04/15/2023   HLD (hyperlipidemia) 03/10/2023   Diarrhea 03/10/2023   Abnormal LFTs 03/10/2023   Thrombocytopenia (HCC) 03/10/2023   Abnormal liver function 03/10/2023   Hypomagnesemia 03/10/2023   Sepsis (HCC) 04/18/2022   Cellulitis of left forearm 04/18/2022   MSSA bacteremia    Anxiety 04/10/2022   Arthritis 04/10/2022   ED (erectile dysfunction) 04/10/2022   Insomnia 04/10/2022   Supraumbilical hernia without gangrene and without obstruction    Left inguinal hernia    Gastritis, erosive    Abdominal bloating    Adenomatous polyp of colon    Colon cancer screening    Hypomagnesuria 07/29/2021   Hyponatremia 06/26/2021   Alcohol withdrawal (HCC) 06/26/2021   Polysubstance abuse (HCC) 06/26/2021   Incidental adrenal cortical adenoma 06/26/2021   Abnormal nuclear stress test    Chest pain 05/18/2021   CAD (coronary artery disease) 05/18/2021   Tobacco use disorder 05/18/2021   History of substance abuse (HCC) 01/12/2020   Alcohol dependence (HCC) 01/12/2020   Coronary artery disease involving native coronary artery of native heart without angina pectoris 04/14/2019   Essential  hypertension 04/14/2019   Hyperlipidemia associated with type 2 diabetes mellitus (HCC) 04/14/2019   History of cerebrovascular accident (CVA) with residual deficit 04/14/2019   Residual cognitive deficit as late effect of stroke 04/14/2019   Type 2 diabetes mellitus with vascular disease (HCC) 04/14/2019   Diabetic polyneuropathy associated with type 2 diabetes mellitus (HCC) 04/14/2019   Rheumatoid arthritis involving multiple sites (HCC) 04/14/2019   Flat foot 04/14/2019   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   Concentration deficit 07/29/2018   Non compliance w medication regimen 05/22/2018   Laceration of left wrist 10/04/2017   Major depressive disorder, 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

## 2023-04-18 NOTE — Progress Notes (Signed)
Pt's CIWA was 12. Pt complained of diarrhea and headache. PRN Lorazepam 2mg , Acetaminophen and Pepto Bismol were administered per order. Will continue plan of care.

## 2023-04-18 NOTE — ED Notes (Signed)
Patient is sleeping. Respirations equal and unlabored, skin warm and dry. No change in assessment or acuity. Routine safety checks conducted according to facility protocol. Will continue to monitor for safety.   

## 2023-04-18 NOTE — Progress Notes (Addendum)
Pt's CIWA was 11.

## 2023-04-19 DIAGNOSIS — Z720 Tobacco use: Secondary | ICD-10-CM | POA: Diagnosis not present

## 2023-04-19 DIAGNOSIS — F1020 Alcohol dependence, uncomplicated: Secondary | ICD-10-CM | POA: Diagnosis not present

## 2023-04-19 DIAGNOSIS — Z9151 Personal history of suicidal behavior: Secondary | ICD-10-CM | POA: Diagnosis not present

## 2023-04-19 DIAGNOSIS — I10 Essential (primary) hypertension: Secondary | ICD-10-CM | POA: Diagnosis not present

## 2023-04-19 DIAGNOSIS — Z800 Family history of malignant neoplasm of digestive organs: Secondary | ICD-10-CM | POA: Diagnosis not present

## 2023-04-19 DIAGNOSIS — F1090 Alcohol use, unspecified, uncomplicated: Secondary | ICD-10-CM | POA: Diagnosis not present

## 2023-04-19 LAB — LIPID PANEL
Cholesterol: 183 mg/dL (ref 0–200)
HDL: 98 mg/dL (ref >40)
LDL Cholesterol: 67 mg/dL (ref 0–99)
Total CHOL/HDL Ratio: 1.9 ratio
Triglycerides: 91 mg/dL (ref <150)
VLDL: 18 mg/dL (ref 0–40)

## 2023-04-19 MED ORDER — LORAZEPAM 1 MG PO TABS: 1.0000 mg | ORAL_TABLET | ORAL | Status: DC | PRN

## 2023-04-19 MED ORDER — LORAZEPAM 2 MG/ML IJ SOLN: 1.0000 mg | INTRAMUSCULAR | Status: DC | PRN

## 2023-04-19 NOTE — ED Notes (Addendum)
Pt in dayroom with peers, watching tv. Voices c/o mild tremors. Pt is med focused, requesting when he can receive meds again. Denies SI/HI/AVH.He is pleasant and engaged with staff and peers. No noted distress. Will continue to monitor for safety

## 2023-04-19 NOTE — ED Notes (Signed)
Pt sleeping in no acute distress. RR even and unlabored. Environment secured. Will continue to monitor for safety. 

## 2023-04-19 NOTE — Group Note (Signed)
Group Topic: Wellness  Group Date: 04/19/2023 Start Time: 1000 End Time: 1030 Facilitators: Priscille Kluver, NT  Department: [facility]  Number of Participants: 4  Group Focus: daily focus Treatment Modality:  Skills Training Interventions utilized were support Purpose: enhance coping skills  Name: Tyler Martinez Date of Birth: [birth year]  MR: [id number]    Level of Participation: minimal Quality of Participation: attentive Interactions with others: gave feedback Mood/Affect: appropriate  Cognition: coherent/clear Progress: Moderate  Plan:Have realistic goals Patients Problems:  Patient Active Problem List   Diagnosis Date Noted   Dizziness 04/16/2023   Cough 04/15/2023   HLD (hyperlipidemia) 03/10/2023   Diarrhea 03/10/2023   Abnormal LFTs 03/10/2023   Thrombocytopenia (HCC) 03/10/2023   Abnormal liver function 03/10/2023   Hypomagnesemia 03/10/2023   Sepsis (HCC) 04/18/2022   Cellulitis of left forearm 04/18/2022   MSSA bacteremia    Anxiety 04/10/2022   Arthritis 04/10/2022   ED (erectile dysfunction) 04/10/2022   Insomnia 04/10/2022   Supraumbilical hernia without gangrene and without obstruction    Left inguinal hernia    Gastritis, erosive    Abdominal bloating    Adenomatous polyp of colon    Colon cancer screening    Hypomagnesuria 07/29/2021   Hyponatremia 06/26/2021   Alcohol withdrawal (HCC) 06/26/2021   Polysubstance abuse (HCC) 06/26/2021   Incidental adrenal cortical adenoma 06/26/2021   Abnormal nuclear stress test    Chest pain 05/18/2021   CAD (coronary artery disease) 05/18/2021   Tobacco use disorder 05/18/2021   History of substance abuse (HCC) 01/12/2020   Alcohol dependence (HCC) 01/12/2020   Coronary artery disease involving native coronary artery of native heart without angina pectoris 04/14/2019   Essential hypertension 04/14/2019   Hyperlipidemia associated with type 2 diabetes mellitus (HCC)  04/14/2019   History of cerebrovascular accident (CVA) with residual deficit 04/14/2019   Residual cognitive deficit as late effect of stroke 04/14/2019   Type 2 diabetes mellitus with vascular disease (HCC) 04/14/2019   Diabetic polyneuropathy associated with type 2 diabetes mellitus (HCC) 04/14/2019   Rheumatoid arthritis involving multiple sites (HCC) 04/14/2019   Flat foot 04/14/2019   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   Concentration deficit 07/29/2018   Non compliance w medication regimen 05/22/2018   Laceration of left wrist 10/04/2017   Major depressive disorder, 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

## 2023-04-19 NOTE — ED Notes (Signed)
Pt found in wrong room, he was making the bed. Re-directed to is own room. He then ambulated to dayroom to re-join AA group. Will continue to monitor for safety

## 2023-04-19 NOTE — Group Note (Unsigned)
Group Topic: Relapse and Recovery  Group Date: 04/19/2023 Start Time: 2000 End Time: 2100 Facilitators: Hilma Favors, RN  Department: [facility]  Number of Participants: 5  Group Focus: relapse prevention Treatment Modality:  Behavior Modification Therapy Interventions utilized were support Purpose: regain self-worth  Name: Giovoni Hefferon Date of Birth: [birth year]  MR: [id number]    Level of Participation: minimal Quality of Participation: cooperative Interactions with others: gave feedback Mood/Affect: appropriate Triggers (if applicable):  Cognition: coherent/clear Progress: Minimal Response:  Plan: referral / recommendations  Patients Problems:  Patient Active Problem List   Diagnosis Date Noted   Dizziness 04/16/2023   Cough 04/15/2023   HLD (hyperlipidemia) 03/10/2023   Diarrhea 03/10/2023   Abnormal LFTs 03/10/2023   Thrombocytopenia (HCC) 03/10/2023   Abnormal liver function 03/10/2023   Hypomagnesemia 03/10/2023   Sepsis (HCC) 04/18/2022   Cellulitis of left forearm 04/18/2022   MSSA bacteremia    Anxiety 04/10/2022   Arthritis 04/10/2022   ED (erectile dysfunction) 04/10/2022   Insomnia 04/10/2022   Supraumbilical hernia without gangrene and without obstruction    Left inguinal hernia    Gastritis, erosive    Abdominal bloating    Adenomatous polyp of colon    Colon cancer screening    Hypomagnesuria 07/29/2021   Hyponatremia 06/26/2021   Alcohol withdrawal (HCC) 06/26/2021   Polysubstance abuse (HCC) 06/26/2021   Incidental adrenal cortical adenoma 06/26/2021   Abnormal nuclear stress test    Chest pain 05/18/2021   CAD (coronary artery disease) 05/18/2021   Tobacco use disorder 05/18/2021   History of substance abuse (HCC) 01/12/2020   Alcohol dependence (HCC) 01/12/2020   Coronary artery disease involving native coronary artery of native heart without angina pectoris 04/14/2019   Essential hypertension  04/14/2019   Hyperlipidemia associated with type 2 diabetes mellitus (HCC) 04/14/2019   History of cerebrovascular accident (CVA) with residual deficit 04/14/2019   Residual cognitive deficit as late effect of stroke 04/14/2019   Type 2 diabetes mellitus with vascular disease (HCC) 04/14/2019   Diabetic polyneuropathy associated with type 2 diabetes mellitus (HCC) 04/14/2019   Rheumatoid arthritis involving multiple sites (HCC) 04/14/2019   Flat foot 04/14/2019   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   Concentration deficit 07/29/2018   Non compliance w medication regimen 05/22/2018   Laceration of left wrist 10/04/2017   Major depressive disorder, 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

## 2023-04-19 NOTE — ED Notes (Signed)
Called [facility] lab dept to perform an add-on Lipid panel

## 2023-04-19 NOTE — ED Notes (Signed)
Patient is sleeping. Respirations equal and unlabored, skin warm and dry. No change in assessment or acuity. Routine safety checks conducted according to facility protocol. Will continue to monitor for safety.   

## 2023-04-19 NOTE — ED Provider Notes (Cosign Needed Addendum)
Behavioral Health Progress Note  Date and Time: 04/19/2023 9:41 AM Name: Tyler Martinez MRN:  [id number]  Subjective: Patient says he feels better today compared to yesterday. He is very grateful for the care he is receiving. He endorses experiencing cravings for the following substances: alcohol . He denies experiencing any withdrawal symptoms at this time.  He remains interested in residential rehab placement.  Patient does not endorse any side-effects they attribute to medications. Patient does not endorse any somatic complaints  Diagnosis:  Final diagnoses:  Alcohol use disorder  Tobacco use disorder   Total Time spent with patient: 45 minutes   Past Psychiatric History: denies  Past Medical History: HTN, diabetes Family History: brother was alcoholic but quit Social History: patient is close to his brother. They both live in [place], [place]. 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, mining Social History:    Sleep: Good Number of Hours of Sleep: 8  Appetite: Good  Current Medications: Current Facility-Administered Medications  Medication Dose Route Frequency Provider Last Rate Last Admin   acetaminophen (TYLENOL) tablet 650 mg  650 mg Oral Q6H PRN Augusto Gamble, MD   650 mg at 04/18/23 1855   albuterol (VENTOLIN HFA) 108 (90 Base) MCG/ACT inhaler 2 puff  2 puff Inhalation Q6H PRN Augusto Gamble, MD       alum & mag hydroxide-simeth (MAALOX/MYLANTA) 200-200-20 MG/5ML suspension 30 mL  30 mL Oral Q4H PRN Augusto Gamble, MD       amLODipine (NORVASC) tablet 10 mg  10 mg Oral Daily Augusto Gamble, MD       bismuth subsalicylate (PEPTO BISMOL) chewable tablet 524 mg  524 mg Oral Q3H PRN Augusto Gamble, MD   524 mg at 04/18/23 1847   haloperidol (HALDOL) tablet 5 mg  5 mg Oral Q6H PRN Augusto Gamble, MD       And   LORazepam (ATIVAN) tablet 1 mg  1 mg Oral Q6H PRN Augusto Gamble, MD       And   diphenhydrAMINE (BENADRYL) capsule 25 mg  25  mg Oral Q6H PRN Augusto Gamble, MD       haloperidol lactate (HALDOL) injection 5 mg  5 mg Intramuscular Q6H PRN Augusto Gamble, MD       And   LORazepam (ATIVAN) injection 1 mg  1 mg Intravenous Q6H PRN Augusto Gamble, MD       And   diphenhydrAMINE (BENADRYL) injection 25 mg  25 mg Intramuscular Q6H PRN Augusto Gamble, MD       gabapentin (NEURONTIN) capsule 800 mg  800 mg Oral QID Augusto Gamble, MD   800 mg at 04/18/23 2123   hydrOXYzine (ATARAX) tablet 25 mg  25 mg Oral TID PRN Augusto Gamble, MD       lisinopril (ZESTRIL) tablet 40 mg  40 mg Oral Daily Augusto Gamble, MD       LORazepam (ATIVAN) tablet 1-4 mg  1-4 mg Oral Q4H PRN Augusto Gamble, MD   2 mg at 04/18/23 1837   Or   LORazepam (ATIVAN) injection 1-4 mg  1-4 mg Intramuscular Q4H PRN Augusto Gamble, MD       LORazepam (ATIVAN) tablet 1 mg  1 mg Oral QID Augusto Gamble, MD   1 mg at 04/18/23 2123   Followed by   LORazepam (ATIVAN) tablet 1 mg  1 mg Oral TID Augusto Gamble, MD       Followed by   [  START ON 04/20/2023] LORazepam (ATIVAN) tablet 1 mg  1 mg Oral BID Augusto Gamble, MD       Followed by   Melene Muller ON 04/21/2023] LORazepam (ATIVAN) tablet 1 mg  1 mg Oral Once Augusto Gamble, MD       metFORMIN (GLUCOPHAGE) tablet 1,000 mg  1,000 mg Oral BID WC Augusto Gamble, MD   1,000 mg at 04/18/23 1718   thiamine (VITAMIN B1) tablet 100 mg  100 mg Oral Daily Augusto Gamble, MD   100 mg at 04/18/23 1531   And   multivitamin with minerals tablet 1 tablet  1 tablet Oral Daily Augusto Gamble, MD   1 tablet at 04/18/23 1532   nicotine (NICODERM CQ - dosed in mg/24 hours) patch 14 mg  14 mg Transdermal Daily Lenard Lance, FNP   14 mg at 04/18/23 1532   nicotine polacrilex (NICORETTE) gum 2 mg  2 mg Oral PRN Augusto Gamble, MD       ondansetron [facility]) tablet 8 mg  8 mg Oral Q8H PRN Augusto Gamble, MD       polyethylene glycol (MIRALAX / GLYCOLAX) packet 17 g  17 g Oral Daily PRN Augusto Gamble, MD       senna (SENOKOT) tablet 8.6 mg  1 tablet Oral QHS PRN Augusto Gamble, MD       traZODone  (DESYREL) tablet 50 mg  50 mg Oral QHS PRN Sindy Guadeloupe, NP       Current Outpatient Medications  Medication Sig Dispense Refill   albuterol (VENTOLIN HFA) 108 (90 Base) MCG/ACT inhaler Inhale 2 puffs into the lungs every 6 (six) hours as needed for wheezing or shortness of breath. (Patient taking differently: Inhale 2 puffs into the lungs 2 (two) times daily as needed for wheezing or shortness of breath.) 8 g 2   amLODipine (NORVASC) 10 MG tablet TAKE 1 TABLET BY MOUTH EVERY DAY 90 tablet 1   folic acid (FOLVITE) 1 MG tablet Take 1 tablet (1 mg total) by mouth daily. 90 tablet 3   gabapentin (NEURONTIN) 800 MG tablet TAKE 1 TABLET BY MOUTH FOUR TIMES A DAY (Patient taking differently: Take 800 mg by mouth 4 (four) times daily.) 360 tablet 3   lisinopril (ZESTRIL) 40 MG tablet TAKE 1 TABLET BY MOUTH EVERY DAY 90 tablet 1   loperamide (IMODIUM) 2 MG capsule Take 2-4 mg by mouth as needed for diarrhea or loose stools.     metFORMIN (GLUCOPHAGE) 1000 MG tablet Take 1 tablet (1,000 mg total) by mouth 2 (two) times daily with a meal. 180 tablet 0   Multiple Vitamin (MULTIVITAMIN WITH MINERALS) TABS tablet Take 1 tablet by mouth daily.     Naphazoline HCl (CLEAR EYES OP) Place 1-2 drops into both eyes 4 (four) times daily as needed (For eye irritation).     Simethicone (GAS-X PO) Take 3 capsules by mouth daily as needed (flatulence).     sodium chloride 1 g tablet TAKE 1 TABLET (1 G TOTAL) BY MOUTH 2 (TWO) TIMES DAILY WITH A MEAL. 60 tablet 2   thiamine (VITAMIN B1) 100 MG tablet Take 100 mg by mouth daily.     traMADol (ULTRAM) 50 MG tablet TAKE 1 TABLET BY MOUTH EVERY 8 HOURS AS NEEDED (Patient taking differently: Take 50 mg by mouth every 8 (eight) hours as needed for moderate pain.) 60 tablet 2    Labs  Lab Results:     Latest Ref Rng & Units 04/18/2023    2:55  PM 04/16/2023    3:54 AM 04/15/2023    1:24 PM  CBC  WBC 4.0 - 10.5 K/uL 3.7  4.4  4.1   Hemoglobin 13.0 - 17.0 g/dL 62.9  52.8  41.3    Hematocrit 39.0 - 52.0 % RESULTS UNAVAILABLE DUE TO INTERFERING SUBSTANCE  40.3  37.2   Platelets 150 - 400 K/uL 201  137  136       Latest Ref Rng & Units 04/18/2023    2:55 PM 04/16/2023    3:54 AM 04/15/2023    1:24 PM  CMP  Glucose 70 - 99 mg/dL 244  010  272   BUN 6 - 20 mg/dL 5  7  6    Creatinine 0.61 - 1.24 mg/dL 5.36  6.44  0.34   Sodium 135 - 145 mmol/L 127  135  124   Potassium 3.5 - 5.1 mmol/L 3.6  3.9  3.8   Chloride 98 - 111 mmol/L 87  99  88   CO2 22 - 32 mmol/L 22  27  22    Calcium 8.9 - 10.3 mg/dL 8.7  8.6  8.0   Total Protein 6.5 - 8.1 g/dL 6.0     Total Bilirubin 0.3 - 1.2 mg/dL 0.7     Alkaline Phos 38 - 126 U/L 90     AST 15 - 41 U/L 138     ALT 0 - 44 U/L 86       Blood Alcohol level:  Lab Results  Component Value Date   ETH 125 (H) 04/18/2023   ETH <10 04/16/2023   Metabolic Disorder Labs: Lab Results  Component Value Date   HGBA1C 7.2 (H) 03/11/2023   MPG 159.94 03/11/2023   MPG 145.59 03/20/2022   No results found for: "PROLACTIN" Lab Results  Component Value Date   CHOL 142 03/11/2023   TRIG 84 03/11/2023   HDL 80 03/11/2023   CHOLHDL 1.8 03/11/2023   VLDL 17 03/11/2023   LDLCALC 45 03/11/2023   LDLCALC 116 (H) 05/19/2021   Therapeutic Lab Levels: No results found for: "LITHIUM" No results found for: "VALPROATE" No results found for: "CBMZ" Physical Findings   AUDIT    Flowsheet Row ED from 04/18/2023 in [facility] Admission (Discharged) from 09/12/2018 in [facility] INPATIENT BEHAVIORAL MEDICINE  Alcohol Use Disorder Identification Test Final Score (AUDIT) 31 0      GAD-7    Flowsheet Row Office Visit from 03/09/2022 in [facility] Office Visit from 10/16/2021 in [facility] Office Visit from 06/05/2021 in [facility] Office Visit from 04/04/2021 in [facility] Office Visit from 02/06/2021 in [facility]  Total GAD-7 Score 3 7 5 4 7       PHQ2-9    Flowsheet Row ED from 04/18/2023 in [facility] Most recent reading at 04/18/2023  2:39 PM ED from 04/18/2023 in [facility] Most recent reading at 04/18/2023  2:18 PM Office Visit from 03/09/2022 in [facility] Most recent reading at 03/09/2022  1:49 PM Clinical Support from 10/25/2021 in [facility] Most recent reading at 10/25/2021  2:08 PM Office Visit from 10/16/2021 in [facility] Most recent reading at 10/16/2021  9:36 AM  PHQ-2 Total Score 2 2 3 1 2   PHQ-9 Total Score 16 16  8 8 5       Flowsheet Row ED from 04/18/2023 in [facility] Most recent reading at 04/18/2023  2:56 PM ED from 04/18/2023 in [facility] Most recent reading at 04/18/2023  1:41 PM ED to Hosp-Admission (Discharged) from 04/15/2023 in [facility] (1[facility] Most recent reading at 04/16/2023  2:00 AM  C-SSRS RISK CATEGORY No Risk No Risk No Risk       Musculoskeletal  Strength & Muscle Tone: within normal limits Gait & Station: normal Patient leans: N/A  Psychiatric Specialty Exam  Presentation  General Appearance: Casual   Eye Contact:Good   Speech:Clear and Coherent; Normal Rate   Speech Volume:Normal   Handedness:Right   Mood and Affect  Mood:-- ("I feel better")   Affect:Appropriate; Congruent; Full Range   Thought Process  Thought Processes:Coherent; Linear; Goal Directed   Descriptions of Associations:Intact   Orientation:Full (Time, Place and Person)   Thought Content:Logical; WDL  Diagnosis of Schizophrenia or Schizoaffective disorder in past: No    Hallucinations:Hallucinations: None   Ideas of Reference:None   Suicidal Thoughts:Suicidal Thoughts: No   Homicidal  Thoughts:Homicidal Thoughts: No   Sensorium  Memory:Immediate Good; Recent Good; Remote Good   Judgment:Good   Insight:Good   Executive Functions  Concentration:Good   Attention Span:Good   Recall:Good   Fund of Knowledge:Good   Language:Good   Psychomotor Activity  Psychomotor Activity:Psychomotor Activity: Normal   Assets  Assets:Resilience   Sleep  Sleep:Sleep: Good Number of Hours of Sleep: 8   Nutritional Assessment (For OBS and FBC admissions only) Has the patient had a weight loss or gain of 10 pounds or more in the last 3 months?: No Has the patient had a decrease in food intake/or appetite?: No Does the patient have dental problems?: No Does the patient have eating habits or behaviors that may be indicators of an eating disorder including binging or inducing vomiting?: No Has the patient recently lost weight without trying?: 0 Has the patient been eating poorly because of a decreased appetite?: 0 Malnutrition Screening Tool Score: 0    Physical Exam  Physical Exam Vitals and nursing note reviewed.  HENT:     Head: Normocephalic and atraumatic.  Pulmonary:     Effort: Pulmonary effort is normal.  Musculoskeletal:     Cervical back: Normal range of motion.  Neurological:     General: No focal deficit present.     Mental Status: He is alert. Mental status is at baseline.    Review of Systems  Constitutional: Negative.   Respiratory: Negative.    Cardiovascular: Negative.   Gastrointestinal: Negative.   Genitourinary: Negative.   Psychiatric/Behavioral:         Psychiatric subjective data addressed in PSE or HPI / daily subjective report   Blood pressure (!) 122/90, pulse 76, temperature 98.9 F (37.2 C), temperature source Oral, resp. rate 18, SpO2 98%. There is no height or weight on file to calculate BMI.  Treatment Plan Summary: Daily contact with patient to assess and evaluate symptoms and progress in treatment and Medication  management:              -- CIWA with scheduled and as needed lorazepam protocol              -- medical regimen: gabapentin for pain, metformin for diabetes, amlodipine and lisinopril for HTN -- Patient in need of nicotine replacement; nicotine polacrilex (gum) and nicotine patch 14 mg / 24 hours  ordered. Smoking cessation encouraged  PRNs              -- continue acetaminophen 650 mg every 6 hours as needed for mild to moderate pain, fever, and headaches              -- continue hydroxyzine 25 mg three times a day as needed for anxiety              -- continue bismuth subsalicylate 524 mg oral chewable tablet every 3 hours as needed for diarrhea / loose stools              -- continue senna 8.6 mg oral at bedtime and polyethylene glycol 17 g oral daily as needed for mild to moderate constipation              -- continue ondansetron 8 mg every 8 hours as needed for nausea or vomiting              -- continue aluminum-magnesium hydroxide + simethicone 30 mL every 4 hours as needed for heartburn or indigestion              -- continue melatonin 3 mg at bedtime as needed for insomnia  -- As needed agitation protocol in-place  Augusto Gamble, MD 04/19/23 9:41 AM

## 2023-04-19 NOTE — ED Notes (Signed)
Patient A&Ox4. Denies intent to harm self/others when asked. Denies A/VH. Patient denies any physical complaints when asked. No acute distress noted. Support and encouragement provided. Routine safety checks conducted according to facility protocol. Encouraged patient to notify staff if thoughts of harm toward self or others arise. Patient verbalize understanding and agreement. Will continue to monitor for safety.    

## 2023-04-20 DIAGNOSIS — F17200 Nicotine dependence, unspecified, uncomplicated: Secondary | ICD-10-CM | POA: Diagnosis not present

## 2023-04-20 DIAGNOSIS — Z9151 Personal history of suicidal behavior: Secondary | ICD-10-CM | POA: Diagnosis not present

## 2023-04-20 DIAGNOSIS — I10 Essential (primary) hypertension: Secondary | ICD-10-CM | POA: Diagnosis not present

## 2023-04-20 DIAGNOSIS — F1090 Alcohol use, unspecified, uncomplicated: Secondary | ICD-10-CM | POA: Diagnosis not present

## 2023-04-20 DIAGNOSIS — F1020 Alcohol dependence, uncomplicated: Secondary | ICD-10-CM | POA: Diagnosis not present

## 2023-04-20 DIAGNOSIS — Z800 Family history of malignant neoplasm of digestive organs: Secondary | ICD-10-CM | POA: Diagnosis not present

## 2023-04-20 DIAGNOSIS — Z720 Tobacco use: Secondary | ICD-10-CM | POA: Diagnosis not present

## 2023-04-20 LAB — CULTURE, BLOOD (ROUTINE X 2)
Culture: NO GROWTH
Culture: NO GROWTH
Special Requests: ADEQUATE
Special Requests: ADEQUATE

## 2023-04-20 LAB — BASIC METABOLIC PANEL
Anion gap: 9 (ref 5–15)
BUN: 8 mg/dL (ref 6–20)
CO2: 25 mmol/L (ref 22–32)
Calcium: 8.1 mg/dL — ABNORMAL LOW (ref 8.9–10.3)
Chloride: 96 mmol/L — ABNORMAL LOW (ref 98–111)
Creatinine, Ser: 0.81 mg/dL (ref 0.61–1.24)
GFR, Estimated: >60 mL/min (ref >60)
Glucose, Bld: 129 mg/dL — ABNORMAL HIGH (ref 70–99)
Potassium: 3.9 mmol/L (ref 3.5–5.1)
Sodium: 130 mmol/L — ABNORMAL LOW (ref 135–145)

## 2023-04-20 MED ORDER — LORAZEPAM 1 MG PO TABS
1.0000 mg | ORAL_TABLET | Freq: Once | ORAL | Status: DC
  Filled 2023-04-20: qty 1

## 2023-04-20 MED ORDER — LORAZEPAM 1 MG PO TABS
1.0000 mg | ORAL_TABLET | Freq: Once | ORAL | Status: AC
  Administered 2023-04-20: 1 mg via ORAL
  Filled 2023-04-20: qty 1

## 2023-04-20 NOTE — ED Notes (Signed)
Patient is in the bedroom sleeping currently. NAD, respirations even and unlabored. Will continue to monitor for safety.

## 2023-04-20 NOTE — ED Notes (Signed)
Patient is sleeping. Respirations equal and unlabored, skin warm and dry. No change in assessment or acuity. Routine safety checks conducted according to facility protocol. Will continue to monitor for safety.   

## 2023-04-20 NOTE — ED Provider Notes (Addendum)
Behavioral Health Progress Note  Date and Time: 04/20/2023 10:36 AM Name: Tyler Martinez MRN:  [id number]  Subjective:   The patient "Tyler Martinez" is a 52 year old male with a history of alcohol use disorder, 2 previous psychiatric hospitalizations noted in the medical record occurring several years ago, one of which was for a suicide attempt and the other for amphetamine induced psychosis.  The patient has a history of chronic hyponatremia, dating back to at least 2018, at which time he had a medical admission for the problem.  The patient presented [facility] requesting help with increasing alcohol use and request for residential rehab placement.  A pertinent psychosocial stressor is the fact that his brother, with whom he is very close, has been diagnosed with colon cancer.  On interview today, the patient reports experiencing some symptoms of withdrawal including tremulousness, chills, and anxiety.  He is in no acute distress.  He denies ever experiencing an alcohol withdrawal seizure or symptoms consistent with DTs.  His blood pressure is noted to be 138/102 and his heart rate is noted to be 92.  See plan below, which was discussed with the patient.  We discussed his hyponatremia.  Mental status examination is notable for a patient who is fully alert and oriented with a linear and logical thought process.  He agrees to recheck his sodium.  The patient denies auditory/visual hallucinations.  The patient reports good mood, appetite, and sleep. They deny suicidal and homicidal thoughts. The patient denies side effects from their medications.  Review of systems as below.   Diagnosis:  Final diagnoses:  Alcohol use disorder  Tobacco use disorder    Total Time spent with patient: 20 minutes  Past Psychiatric History: as above Past Medical History: as above Family History: none Family Psychiatric  History: none Social History: as above and per H and P  Additional Social History:  See H and  P                  Sleep: Fair  Appetite:  Fair   Current Medications:  Current Facility-Administered Medications  Medication Dose Route Frequency Provider Last Rate Last Admin   acetaminophen (TYLENOL) tablet 650 mg  650 mg Oral Q6H PRN Augusto Gamble, MD   650 mg at 04/19/23 1042   albuterol (VENTOLIN HFA) 108 (90 Base) MCG/ACT inhaler 2 puff  2 puff Inhalation Q6H PRN Augusto Gamble, MD       alum & mag hydroxide-simeth (MAALOX/MYLANTA) 200-200-20 MG/5ML suspension 30 mL  30 mL Oral Q4H PRN Augusto Gamble, MD       amLODipine (NORVASC) tablet 10 mg  10 mg Oral Daily Augusto Gamble, MD   10 mg at 04/20/23 0954   bismuth subsalicylate (PEPTO BISMOL) chewable tablet 524 mg  524 mg Oral Q3H PRN Augusto Gamble, MD   524 mg at 04/20/23 1024   haloperidol (HALDOL) tablet 5 mg  5 mg Oral Q6H PRN Augusto Gamble, MD       And   LORazepam (ATIVAN) tablet 1 mg  1 mg Oral Q6H PRN Augusto Gamble, MD       And   diphenhydrAMINE (BENADRYL) capsule 25 mg  25 mg Oral Q6H PRN Augusto Gamble, MD       haloperidol lactate (HALDOL) injection 5 mg  5 mg Intramuscular Q6H PRN Augusto Gamble, MD       And   LORazepam (ATIVAN) injection 1 mg  1 mg Intravenous Q6H PRN Augusto Gamble, MD  And   diphenhydrAMINE (BENADRYL) injection 25 mg  25 mg Intramuscular Q6H PRN Augusto Gamble, MD       gabapentin (NEURONTIN) capsule 800 mg  800 mg Oral QID Augusto Gamble, MD   800 mg at 04/20/23 3086   hydrOXYzine (ATARAX) tablet 25 mg  25 mg Oral TID PRN Augusto Gamble, MD   25 mg at 04/19/23 1643   lisinopril (ZESTRIL) tablet 40 mg  40 mg Oral Daily Augusto Gamble, MD   40 mg at 04/20/23 0954   LORazepam (ATIVAN) tablet 1 mg  1 mg Oral Q4H PRN Augusto Gamble, MD       Or   LORazepam (ATIVAN) injection 1 mg  1 mg Intramuscular Q4H PRN Augusto Gamble, MD       LORazepam (ATIVAN) tablet 1 mg  1 mg Oral TID Augusto Gamble, MD   1 mg at 04/20/23 5784   Followed by   LORazepam (ATIVAN) tablet 1 mg  1 mg Oral BID Augusto Gamble, MD       Followed by   Melene Muller ON  04/21/2023] LORazepam (ATIVAN) tablet 1 mg  1 mg Oral Once Augusto Gamble, MD       metFORMIN (GLUCOPHAGE) tablet 1,000 mg  1,000 mg Oral BID WC Augusto Gamble, MD   1,000 mg at 04/20/23 0830   thiamine (VITAMIN B1) tablet 100 mg  100 mg Oral Daily Augusto Gamble, MD   100 mg at 04/20/23 6962   And   multivitamin with minerals tablet 1 tablet  1 tablet Oral Daily Augusto Gamble, MD   1 tablet at 04/20/23 0954   nicotine (NICODERM CQ - dosed in mg/24 hours) patch 14 mg  14 mg Transdermal Daily Lenard Lance, FNP   14 mg at 04/20/23 9528   nicotine polacrilex (NICORETTE) gum 2 mg  2 mg Oral PRN Augusto Gamble, MD       ondansetron [facility]) tablet 8 mg  8 mg Oral Q8H PRN Augusto Gamble, MD       polyethylene glycol (MIRALAX / GLYCOLAX) packet 17 g  17 g Oral Daily PRN Augusto Gamble, MD       senna (SENOKOT) tablet 8.6 mg  1 tablet Oral QHS PRN Augusto Gamble, MD       traZODone (DESYREL) tablet 50 mg  50 mg Oral QHS PRN Sindy Guadeloupe, NP       Current Outpatient Medications  Medication Sig Dispense Refill   albuterol (VENTOLIN HFA) 108 (90 Base) MCG/ACT inhaler Inhale 2 puffs into the lungs every 6 (six) hours as needed for wheezing or shortness of breath. (Patient taking differently: Inhale 2 puffs into the lungs 2 (two) times daily as needed for wheezing or shortness of breath.) 8 g 2   amLODipine (NORVASC) 10 MG tablet TAKE 1 TABLET BY MOUTH EVERY DAY 90 tablet 1   folic acid (FOLVITE) 1 MG tablet Take 1 tablet (1 mg total) by mouth daily. 90 tablet 3   gabapentin (NEURONTIN) 800 MG tablet TAKE 1 TABLET BY MOUTH FOUR TIMES A DAY (Patient taking differently: Take 800 mg by mouth 4 (four) times daily.) 360 tablet 3   lisinopril (ZESTRIL) 40 MG tablet TAKE 1 TABLET BY MOUTH EVERY DAY 90 tablet 1   loperamide (IMODIUM) 2 MG capsule Take 2-4 mg by mouth as needed for diarrhea or loose stools.     metFORMIN (GLUCOPHAGE) 1000 MG tablet Take 1 tablet (1,000 mg total) by mouth 2 (two) times daily with a meal. 180 tablet 0  Multiple Vitamin (MULTIVITAMIN WITH MINERALS) TABS tablet Take 1 tablet by mouth daily.     Naphazoline HCl (CLEAR EYES OP) Place 1-2 drops into both eyes 4 (four) times daily as needed (For eye irritation).     Simethicone (GAS-X PO) Take 3 capsules by mouth daily as needed (flatulence).     sodium chloride 1 g tablet TAKE 1 TABLET (1 G TOTAL) BY MOUTH 2 (TWO) TIMES DAILY WITH A MEAL. 60 tablet 2   thiamine (VITAMIN B1) 100 MG tablet Take 100 mg by mouth daily.     traMADol (ULTRAM) 50 MG tablet TAKE 1 TABLET BY MOUTH EVERY 8 HOURS AS NEEDED (Patient taking differently: Take 50 mg by mouth every 8 (eight) hours as needed for moderate pain.) 60 tablet 2    Labs  Lab Results:  Admission on 04/18/2023  Component Date Value Ref Range Status   Cholesterol 04/18/2023 183  0 - 200 mg/dL Final   Triglycerides 62/83/1517 91  <150 mg/dL Final   HDL 61/60/7371 98  >40 mg/dL Final   Total CHOL/HDL Ratio 04/18/2023 1.9  RATIO Final   VLDL 04/18/2023 18  0 - 40 mg/dL Final   LDL Cholesterol 04/18/2023 67  0 - 99 mg/dL Final   Comment:        Total Cholesterol/HDL:CHD Risk Coronary Heart Disease Risk Table                     Men   Women  1/2 Average Risk   3.4   3.3  Average Risk       5.0   4.4  2 X Average Risk   9.6   7.1  3 X Average Risk  23.4   11.0        Use the calculated Patient Ratio above and the CHD Risk Table to determine the patient's CHD Risk.        ATP III CLASSIFICATION (LDL):  <100     mg/dL   Optimal  062-694  mg/dL   Near or Above                    Optimal  130-159  mg/dL   Borderline  854-627  mg/dL   High  >035     mg/dL   Very High Performed at [facility], [address], [place], [place] [address]   Admission on 04/18/2023, Discharged on 04/18/2023  Component Date Value Ref Range Status   WBC 04/18/2023 3.7 (L)  4.0 - 10.5 K/uL Final   RBC 04/18/2023 RESULTS UNAVAILABLE DUE TO INTERFERING SUBSTANCE  4.22 - 5.81 MIL/uL Final   Hemoglobin 04/18/2023  15.0  13.0 - 17.0 g/dL Final   HCT 18/29/9371 RESULTS UNAVAILABLE DUE TO INTERFERING SUBSTANCE  39.0 - 52.0 % Final   MCV 04/18/2023 RESULTS UNAVAILABLE DUE TO INTERFERING SUBSTANCE  80.0 - 100.0 fL Final   [facility] 04/18/2023 RESULTS UNAVAILABLE DUE TO INTERFERING SUBSTANCE  26.0 - 34.0 pg Final   MCHC 04/18/2023 RESULTS UNAVAILABLE DUE TO INTERFERING SUBSTANCE  30.0 - 36.0 g/dL Final   RDW 69/67/8938 RESULTS UNAVAILABLE DUE TO INTERFERING SUBSTANCE  11.5 - 15.5 % Final   Platelets 04/18/2023 201  150 - 400 K/uL Final   nRBC 04/18/2023 0.0  0.0 - 0.2 % Final   Neutrophils Relative % 04/18/2023 64  % Final   Neutro Abs 04/18/2023 2.4  1.7 - 7.7 K/uL Final   Lymphocytes Relative 04/18/2023 17  % Final  Lymphs Abs 04/18/2023 0.6 (L)  0.7 - 4.0 K/uL Final   Monocytes Relative 04/18/2023 18  % Final   Monocytes Absolute 04/18/2023 0.7  0.1 - 1.0 K/uL Final   Eosinophils Relative 04/18/2023 1  % Final   Eosinophils Absolute 04/18/2023 0.0  0.0 - 0.5 K/uL Final   Basophils Relative 04/18/2023 0  % Final   Basophils Absolute 04/18/2023 0.0  0.0 - 0.1 K/uL Final   WBC Morphology 04/18/2023 MORPHOLOGY UNREMARKABLE   Final   RBC Morphology 04/18/2023 MORPHOLOGY UNREMARKABLE   Final   Smear Review 04/18/2023 PLATELET COUNT CONFIRMED BY SMEAR   Final   Abs Immature Granulocytes 04/18/2023 0.00  0.00 - 0.07 K/uL Final   Performed at [facility], [address], [place], [place] [address]   Sodium 04/18/2023 127 (L)  135 - 145 mmol/L Final   DELTA CHECK NOTED   Potassium 04/18/2023 3.6  3.5 - 5.1 mmol/L Final   Chloride 04/18/2023 87 (L)  98 - 111 mmol/L Final   CO2 04/18/2023 22  22 - 32 mmol/L Final   Glucose, Bld 04/18/2023 118 (H)  70 - 99 mg/dL Final   Glucose reference range applies only to samples taken after fasting for at least 8 hours.   BUN 04/18/2023 <5 (L)  6 - 20 mg/dL Final   Creatinine, Ser 04/18/2023 0.60 (L)  0.61 - 1.24 mg/dL Final   Calcium 60/45/4098 8.7 (L)  8.9 - 10.3  mg/dL Final   Total Protein 11/91/4782 6.0 (L)  6.5 - 8.1 g/dL Final   Albumin 95/62/1308 3.4 (L)  3.5 - 5.0 g/dL Final   AST 65/78/4696 138 (H)  15 - 41 U/L Final   ALT 04/18/2023 86 (H)  0 - 44 U/L Final   Alkaline Phosphatase 04/18/2023 90  38 - 126 U/L Final   Total Bilirubin 04/18/2023 0.7  0.3 - 1.2 mg/dL Final   GFR, Estimated 04/18/2023 >60  >60 mL/min Final   Comment: (NOTE) Calculated using the CKD-EPI Creatinine Equation (2021)    Anion gap 04/18/2023 18 (H)  5 - 15 Final   Performed at [facility], [address], [place], [place] [address]   Magnesium 04/18/2023 1.4 (L)  1.7 - 2.4 mg/dL Final   Performed at [facility], [address] [place], [place] [address]   Alcohol, Ethyl (B) 04/18/2023 125 (H)  <10 mg/dL Final   Comment: (NOTE) Lowest detectable limit for serum alcohol is 10 mg/dL.  For medical purposes only. Performed at [facility], [address], [place], [place] [address]    TSH 04/18/2023 2.888  0.350 - 4.500 uIU/mL Final   Comment: Performed by a 3rd Generation assay with a functional sensitivity of <=0.01 uIU/mL. Performed at [facility], [address] [place], [place] [address]    POC Amphetamine UR 04/18/2023 None Detected  NONE DETECTED (Cut Off Level 1000 ng/mL) Final   POC Secobarbital (BAR) 04/18/2023 None Detected  NONE DETECTED (Cut Off Level 300 ng/mL) Final   POC Buprenorphine (BUP) 04/18/2023 None Detected  NONE DETECTED (Cut Off Level 10 ng/mL) Final   POC Oxazepam (BZO) 04/18/2023 Positive (A)  NONE DETECTED (Cut Off Level 300 ng/mL) Final   POC Cocaine UR 04/18/2023 None Detected  NONE DETECTED (Cut Off Level 300 ng/mL) Final   POC Methamphetamine UR 04/18/2023 None Detected  NONE DETECTED (Cut Off Level 1000 ng/mL) Final   POC Morphine 04/18/2023 None Detected  NONE DETECTED (  Cut Off Level 300 ng/mL) Final   POC Methadone UR 04/18/2023 None Detected  NONE DETECTED (Cut Off Level 300 ng/mL) Final   POC  Oxycodone UR 04/18/2023 None Detected  NONE DETECTED (Cut Off Level 100 ng/mL) Final   POC Marijuana UR 04/18/2023 None Detected  NONE DETECTED (Cut Off Level 50 ng/mL) Final  Admission on 04/15/2023, Discharged on 04/16/2023  Component Date Value Ref Range Status   Sodium 04/15/2023 124 (L)  135 - 145 mmol/L Final   Potassium 04/15/2023 3.8  3.5 - 5.1 mmol/L Final   Chloride 04/15/2023 88 (L)  98 - 111 mmol/L Final   CO2 04/15/2023 22  22 - 32 mmol/L Final   Glucose, Bld 04/15/2023 134 (H)  70 - 99 mg/dL Final   Glucose reference range applies only to samples taken after fasting for at least 8 hours.   BUN 04/15/2023 6  6 - 20 mg/dL Final   Creatinine, Ser 04/15/2023 1.08  0.61 - 1.24 mg/dL Final   Calcium 60/45/4098 8.0 (L)  8.9 - 10.3 mg/dL Final   GFR, Estimated 04/15/2023 >60  >60 mL/min Final   Comment: (NOTE) Calculated using the CKD-EPI Creatinine Equation (2021)    Anion gap 04/15/2023 14  5 - 15 Final   Performed at [facility], [address] [place], [place] [address]   WBC 04/15/2023 4.1  4.0 - 10.5 K/uL Final   RBC 04/15/2023 3.54 (L)  4.22 - 5.81 MIL/uL Final   Hemoglobin 04/15/2023 13.8  13.0 - 17.0 g/dL Final   HCT 78/29/5621 37.2 (L)  39.0 - 52.0 % Final   MCV 04/15/2023 105.1 (H)  80.0 - 100.0 fL Final   MCH 04/15/2023 39.0 (H)  26.0 - 34.0 pg Final   MCHC 04/15/2023 37.1 (H)  30.0 - 36.0 g/dL Final   RDW 30/86/5784 12.9  11.5 - 15.5 % Final   Platelets 04/15/2023 136 (L)  150 - 400 K/uL Final   nRBC 04/15/2023 0.0  0.0 - 0.2 % Final   Performed at [facility], [address] [place], [place] [address]   Troponin I (High Sensitivity) 04/15/2023 17  <18 ng/L Final   Comment: (NOTE) Elevated high sensitivity troponin I (hsTnI) values and significant  changes across serial measurements may suggest ACS but many other  chronic and acute conditions are known to elevate hsTnI results.  Refer to the "Links" section for chest pain algorithms and  additional  guidance. Performed at [facility], [address] [place], [place] [address]    SARS Coronavirus 2 by RT PCR 04/15/2023 NEGATIVE  NEGATIVE Final   Comment: (NOTE) SARS-CoV-2 target nucleic acids are NOT DETECTED.  The SARS-CoV-2 RNA is generally detectable in upper and lower respiratory specimens during the acute phase of infection. The lowest concentration of SARS-CoV-2 viral copies this assay can detect is 250 copies / mL. A negative result does not preclude SARS-CoV-2 infection and should not be used as the sole basis for treatment or other patient management decisions.  A negative result may occur with improper specimen collection / handling, submission of specimen other than nasopharyngeal swab, presence of viral mutation(s) within the areas targeted by this assay, and inadequate number of viral copies (<250 copies / mL). A negative result must be combined with clinical observations, patient history, and epidemiological information.  Fact Sheet for Patients:   RoadLapTop.co.za  Fact Sheet for Healthcare Providers: http://kim-miller.com/  This test is not yet approved or  cleared by the [place] and has been authorized for detection and/or diagnosis of SARS-CoV-2 by FDA under an Emergency Use Authorization (EUA).  This EUA will remain in effect (meaning this test can be used) for the duration of the COVID-19 declaration under Section 564(b)(1) of the Act, 21 U.S.C. section 360bbb-3(b)(1), unless the authorization is terminated or revoked sooner.  Performed at [facility], [address] [place], [place] [address]    Lactic Acid, Venous 04/15/2023 2.2 (HH)  0.5 - 1.9 mmol/L Final   Comment: CRITICAL RESULT CALLED TO, READ BACK BY AND VERIFIED WITH ELLEN FLUECKIGER 1453 04/15/23 MU Performed at [facility], [address] [place], [place]  [address]    Lactic Acid, Venous 04/15/2023 1.5  0.5 - 1.9 mmol/L Final   Performed at [facility], [address] [place], [place] [address]   Specimen Description 04/15/2023 BLOOD RIGTH [facility]   Final   Special Requests 04/15/2023 BOTTLES DRAWN AEROBIC AND ANAEROBIC Blood Culture adequate volume   Final   Culture 04/15/2023    Final                   Value:NO GROWTH 5 DAYS Performed at [facility], [address] [place], [place] [address]    Report Status 04/15/2023 04/20/2023 FINAL   Final   Specimen Description 04/15/2023 BLOOD BLOOD LEFT ARM   Final   Special Requests 04/15/2023 BOTTLES DRAWN AEROBIC AND ANAEROBIC Blood Culture adequate volume   Final   Culture 04/15/2023    Final                   Value:NO GROWTH 5 DAYS Performed at [facility], [address], [place], [place] [address]    Report Status 04/15/2023 04/20/2023 FINAL   Final   Troponin I (High Sensitivity) 04/15/2023 15  <18 ng/L Final   Comment: (NOTE) Elevated high sensitivity troponin I (hsTnI) values and significant  changes across serial measurements may suggest ACS but many other  chronic and acute conditions are known to elevate hsTnI results.  Refer to the "Links" section for chest pain algorithms and additional  guidance. Performed at [facility], [address] [place], [place] [address]    Sodium 04/16/2023 135  135 - 145 mmol/L Final   Potassium 04/16/2023 3.9  3.5 - 5.1 mmol/L Final   Chloride 04/16/2023 99  98 - 111 mmol/L Final   CO2 04/16/2023 27  22 - 32 mmol/L Final   Glucose, Bld 04/16/2023 137 (H)  70 - 99 mg/dL Final   Glucose reference range applies only to samples taken after fasting for at least 8 hours.   BUN 04/16/2023 7  6 - 20 mg/dL Final   Creatinine, Ser 04/16/2023 0.70  0.61 - 1.24 mg/dL Final   Calcium 03/47/4259 8.6 (L)  8.9 - 10.3 mg/dL Final   GFR, Estimated 04/16/2023 >60  >60 mL/min Final   Comment: (NOTE) Calculated using the  CKD-EPI Creatinine Equation (2021)    Anion gap 04/16/2023 9  5 - 15 Final   Performed at [facility], [address] [place], [place] [address]   WBC 04/16/2023 4.4  4.0 - 10.5 K/uL Final   RBC 04/16/2023 3.78 (L)  4.22 - 5.81 MIL/uL Final   Hemoglobin 04/16/2023 14.7  13.0 - 17.0 g/dL Final   HCT 56/43/3295 40.3  39.0 - 52.0 % Final   MCV 04/16/2023 106.6 (H)  80.0 - 100.0 fL Final  MCH 04/16/2023 38.9 (H)  26.0 - 34.0 pg Final   MCHC 04/16/2023 36.5 (H)  30.0 - 36.0 g/dL Final   RDW 16/05/9603 13.2  11.5 - 15.5 % Final   Platelets 04/16/2023 137 (L)  150 - 400 K/uL Final   nRBC 04/16/2023 0.0  0.0 - 0.2 % Final   Performed at [facility], [address] [place], [place] [address]   Procalcitonin 04/15/2023 0.16  ng/mL Final   Comment:        Interpretation: PCT (Procalcitonin) <= 0.5 ng/mL: Systemic infection (sepsis) is not likely. Local bacterial infection is possible. (NOTE)       Sepsis PCT Algorithm           Lower Respiratory Tract                                      Infection PCT Algorithm    ----------------------------     ----------------------------         PCT < 0.25 ng/mL                PCT < 0.10 ng/mL          Strongly encourage             Strongly discourage   discontinuation of antibiotics    initiation of antibiotics    ----------------------------     -----------------------------       PCT 0.25 - 0.50 ng/mL            PCT 0.10 - 0.25 ng/mL               OR       >80% decrease in PCT            Discourage initiation of                                            antibiotics      Encourage discontinuation           of antibiotics    ----------------------------     -----------------------------         PCT >= 0.50 ng/mL              PCT 0.26 - 0.50 ng/mL               AND                                 <80% decrease in PCT             Encourage initiation of                                             antibiotics        Encourage continuation           of antibiotics    ----------------------------     -----------------------------        PCT >= 0.50 ng/mL                  PCT > 0.50 ng/mL  AND         increase in PCT                  Strongly encourage                                      initiation of antibiotics    Strongly encourage escalation           of antibiotics                                     -----------------------------                                           PCT <= 0.25 ng/mL                                                 OR                                        > 80% decrease in PCT                                      Discontinue / Do not initiate                                             antibiotics  Performed at [facility], [address] [place], [place] [address]    Procalcitonin 04/16/2023 0.11  ng/mL Final   Comment:        Interpretation: PCT (Procalcitonin) <= 0.5 ng/mL: Systemic infection (sepsis) is not likely. Local bacterial infection is possible. (NOTE)       Sepsis PCT Algorithm           Lower Respiratory Tract                                      Infection PCT Algorithm    ----------------------------     ----------------------------         PCT < 0.25 ng/mL                PCT < 0.10 ng/mL          Strongly encourage             Strongly discourage   discontinuation of antibiotics    initiation of antibiotics    ----------------------------     -----------------------------       PCT 0.25 - 0.50 ng/mL            PCT 0.10 - 0.25 ng/mL               OR       >80% decrease in  PCT            Discourage initiation of                                            antibiotics      Encourage discontinuation           of antibiotics    ----------------------------     -----------------------------         PCT >= 0.50 ng/mL              PCT 0.26 - 0.50 ng/mL               AND                                 <80% decrease in PCT              Encourage initiation of                                             antibiotics       Encourage continuation           of antibiotics    ----------------------------     -----------------------------        PCT >= 0.50 ng/mL                  PCT > 0.50 ng/mL               AND         increase in PCT                  Strongly encourage                                      initiation of antibiotics    Strongly encourage escalation           of antibiotics                                     -----------------------------                                           PCT <= 0.25 ng/mL                                                 OR                                        > 80% decrease in PCT  Discontinue / Do not initiate                                             antibiotics  Performed at [facility], [address] [place], [place] [address]    Glucose-Capillary 04/15/2023 157 (H)  70 - 99 mg/dL Final   Glucose reference range applies only to samples taken after fasting for at least 8 hours.   Glucose-Capillary 04/16/2023 141 (H)  70 - 99 mg/dL Final   Glucose reference range applies only to samples taken after fasting for at least 8 hours.   Phosphorus 04/16/2023 3.6  2.5 - 4.6 mg/dL Final   Performed at [facility], [address] [place], [place] [address]   Magnesium 04/16/2023 1.6 (L)  1.7 - 2.4 mg/dL Final   Performed at [facility], [address] [place], [place] [address]   Alcohol, Ethyl (B) 04/16/2023 <10  <10 mg/dL Final   Comment: (NOTE) Lowest detectable limit for serum alcohol is 10 mg/dL.  For medical purposes only. Performed at [facility], [address] [place], [place] [address]   Admission on 03/11/2023, Discharged on 03/11/2023  Component Date Value Ref Range Status   WBC 03/11/2023 3.7 (L)  4.0 - 10.5 K/uL Final   RBC 03/11/2023 3.93 (L)  4.22 - 5.81 MIL/uL Final    Hemoglobin 03/11/2023 14.7  13.0 - 17.0 g/dL Final   HCT 52/84/1324 40.6  39.0 - 52.0 % Final   MCV 03/11/2023 103.3 (H)  80.0 - 100.0 fL Final   MCH 03/11/2023 37.4 (H)  26.0 - 34.0 pg Final   MCHC 03/11/2023 36.2 (H)  30.0 - 36.0 g/dL Final   RDW 40/05/2724 12.5  11.5 - 15.5 % Final   Platelets 03/11/2023 100 (L)  150 - 400 K/uL Final   Comment: Immature Platelet Fraction may be clinically indicated, consider ordering this additional test [id number] REPEATED TO VERIFY PLATELET COUNT CONFIRMED BY SMEAR    nRBC 03/11/2023 0.0  0.0 - 0.2 % Final   Neutrophils Relative % 03/11/2023 72  % Final   Neutro Abs 03/11/2023 2.7  1.7 - 7.7 K/uL Final   Lymphocytes Relative 03/11/2023 15  % Final   Lymphs Abs 03/11/2023 0.6 (L)  0.7 - 4.0 K/uL Final   Monocytes Relative 03/11/2023 10  % Final   Monocytes Absolute 03/11/2023 0.4  0.1 - 1.0 K/uL Final   Eosinophils Relative 03/11/2023 1  % Final   Eosinophils Absolute 03/11/2023 0.0  0.0 - 0.5 K/uL Final   Basophils Relative 03/11/2023 1  % Final   Basophils Absolute 03/11/2023 0.0  0.0 - 0.1 K/uL Final   Immature Granulocytes 03/11/2023 1  % Final   Abs Immature Granulocytes 03/11/2023 0.02  0.00 - 0.07 K/uL Final   Performed at [facility], [address] [place], [place] [address]   Troponin I (High Sensitivity) 03/11/2023 37 (H)  <18 ng/L Final   Comment: (NOTE) Elevated high sensitivity troponin I (hsTnI) values and significant  changes across serial measurements may suggest ACS but many other  chronic and acute conditions are known to elevate hsTnI results.  Refer to the "Links" section for chest pain algorithms and additional  guidance. Performed at [facility], [address] [place], [place] [address]    Total CK 03/11/2023 173  49 - 397 U/L  Final   Performed at [facility], [address] [place], [place] [address]   Sodium 03/11/2023 132 (L)  135 - 145 mmol/L Final   Potassium 03/11/2023 3.5   3.5 - 5.1 mmol/L Final   Chloride 03/11/2023 100  98 - 111 mmol/L Final   CO2 03/11/2023 22  22 - 32 mmol/L Final   Glucose, Bld 03/11/2023 251 (H)  70 - 99 mg/dL Final   Glucose reference range applies only to samples taken after fasting for at least 8 hours.   BUN 03/11/2023 9  6 - 20 mg/dL Final   Creatinine, Ser 03/11/2023 0.72  0.61 - 1.24 mg/dL Final   Calcium 29/52/8413 8.4 (L)  8.9 - 10.3 mg/dL Final   Total Protein 24/40/1027 6.4 (L)  6.5 - 8.1 g/dL Final   Albumin 25/36/6440 3.6  3.5 - 5.0 g/dL Final   AST 34/74/2595 57 (H)  15 - 41 U/L Final   ALT 03/11/2023 40  0 - 44 U/L Final   Alkaline Phosphatase 03/11/2023 72  38 - 126 U/L Final   Total Bilirubin 03/11/2023 1.5 (H)  0.3 - 1.2 mg/dL Final   GFR, Estimated 03/11/2023 >60  >60 mL/min Final   Comment: (NOTE) Calculated using the CKD-EPI Creatinine Equation (2021)    Anion gap 03/11/2023 10  5 - 15 Final   Performed at [facility], [address] [place], [place] [address]   Troponin I (High Sensitivity) 03/11/2023 48 (H)  <18 ng/L Final   Comment: (NOTE) Elevated high sensitivity troponin I (hsTnI) values and significant  changes across serial measurements may suggest ACS but many other  chronic and acute conditions are known to elevate hsTnI results.  Refer to the "Links" section for chest pain algorithms and additional  guidance. Performed at [facility], [address] [place], [place] [address]   Admission on 03/10/2023, Discharged on 03/11/2023  Component Date Value Ref Range Status   Lipase 03/10/2023 49  11 - 51 U/L Final   Performed at [facility], [address] [place], [place] [address]   Sodium 03/10/2023 124 (L)  135 - 145 mmol/L Final   Potassium 03/10/2023 4.1  3.5 - 5.1 mmol/L Final   Chloride 03/10/2023 84 (L)  98 - 111 mmol/L Final   CO2 03/10/2023 25  22 - 32 mmol/L Final   Glucose, Bld 03/10/2023 247 (H)  70 - 99 mg/dL Final   Glucose reference range applies  only to samples taken after fasting for at least 8 hours.   BUN 03/10/2023 5 (L)  6 - 20 mg/dL Final   Creatinine, Ser 03/10/2023 0.67  0.61 - 1.24 mg/dL Final   Calcium 41/66/0630 8.9  8.9 - 10.3 mg/dL Final   Total Protein 16/08/930 7.5  6.5 - 8.1 g/dL Final   Albumin 35/57/3220 4.2  3.5 - 5.0 g/dL Final   AST 25/42/7062 88 (H)  15 - 41 U/L Final   ALT 03/10/2023 51 (H)  0 - 44 U/L Final   Alkaline Phosphatase 03/10/2023 95  38 - 126 U/L Final   Total Bilirubin 03/10/2023 1.6 (H)  0.3 - 1.2 mg/dL Final   GFR, Estimated 03/10/2023 >60  >60 mL/min Final   Comment: (NOTE) Calculated using the CKD-EPI Creatinine Equation (2021)    Anion gap 03/10/2023 15  5 - 15 Final   Performed at [facility], [address], [place], [place] [address]   Color, Urine 03/10/2023 AMBER (A)  YELLOW Final  BIOCHEMICALS MAY BE AFFECTED BY COLOR   APPearance 03/10/2023 CLEAR (A)  CLEAR Final   Specific Gravity, Urine 03/10/2023 1.012  1.005 - 1.030 Final   pH 03/10/2023 6.0  5.0 - 8.0 Final   Glucose, UA 03/10/2023 50 (A)  NEGATIVE mg/dL Final   Hgb urine dipstick 03/10/2023 SMALL (A)  NEGATIVE Final   Bilirubin Urine 03/10/2023 NEGATIVE  NEGATIVE Final   Ketones, ur 03/10/2023 NEGATIVE  NEGATIVE mg/dL Final   Protein, ur 84/13/2440 30 (A)  NEGATIVE mg/dL Final   Nitrite 06/23/2535 NEGATIVE  NEGATIVE Final   Leukocytes,Ua 03/10/2023 NEGATIVE  NEGATIVE Final   RBC / HPF 03/10/2023 0-5  0 - 5 RBC/hpf Final   WBC, UA 03/10/2023 0-5  0 - 5 WBC/hpf Final   Bacteria, UA 03/10/2023 NONE SEEN  NONE SEEN Final   Squamous Epithelial / HPF 03/10/2023 0-5  0 - 5 /HPF Final   Mucus 03/10/2023 PRESENT   Final   Performed at [facility], [address] [place], [place] [address]   Troponin I (High Sensitivity) 03/10/2023 47 (H)  <18 ng/L Final   Comment: (NOTE) Elevated high sensitivity troponin I (hsTnI) values and significant  changes across serial measurements may suggest ACS but many  other  chronic and acute conditions are known to elevate hsTnI results.  Refer to the "Links" section for chest pain algorithms and additional  guidance. Performed at [facility], [address] [place], [place] [address]    Alcohol, Ethyl (B) 03/10/2023 <10  <10 mg/dL Final   Comment: (NOTE) Lowest detectable limit for serum alcohol is 10 mg/dL.  For medical purposes only. Performed at [facility], [address] [place], [place] [address]    Prothrombin Time 03/10/2023 12.7  11.4 - 15.2 seconds Final   INR 03/10/2023 0.9  0.8 - 1.2 Final   Comment: (NOTE) INR goal varies based on device and disease states. Performed at [facility], [address] [place], [place] [address]    WBC 03/10/2023 3.8 (L)  4.0 - 10.5 K/uL Final   RBC 03/10/2023 RESULTS UNAVAILABLE DUE TO INTERFERING SUBSTANCE  4.22 - 5.81 MIL/uL Final   Hemoglobin 03/10/2023 15.1  13.0 - 17.0 g/dL Final   HCT 32/95/1884 RESULTS UNAVAILABLE DUE TO INTERFERING SUBSTANCE  39.0 - 52.0 % Final   MCV 03/10/2023 RESULTS UNAVAILABLE DUE TO INTERFERING SUBSTANCE  80.0 - 100.0 fL Final   MCH 03/10/2023 RESULTS UNAVAILABLE DUE TO INTERFERING SUBSTANCE  26.0 - 34.0 pg Final   MCHC 03/10/2023 RESULTS UNAVAILABLE DUE TO INTERFERING SUBSTANCE  30.0 - 36.0 g/dL Final   RDW 16/60/6301 RESULTS UNAVAILABLE DUE TO INTERFERING SUBSTANCE  11.5 - 15.5 % Final   Platelets 03/10/2023 108 (L)  150 - 400 K/uL Final   nRBC 03/10/2023 RESULTS UNAVAILABLE DUE TO INTERFERING SUBSTANCE  0.0 - 0.2 % Final   Performed at [facility], [address] [place], [place] [address]   Troponin I (High Sensitivity) 03/10/2023 41 (H)  <18 ng/L Final   Comment: (NOTE) Elevated high sensitivity troponin I (hsTnI) values and significant  changes across serial measurements may suggest ACS but many other  chronic and acute conditions are known to elevate hsTnI results.  Refer to the "Links" section for chest pain  algorithms and additional  guidance. Performed at [facility], [address] [place], [place] [address]    Osmolality, Ur 03/10/2023 105 (L)  300 - 900 mOsm/kg Final   Comment: REPEATED TO VERIFY Performed  at [facility], [address] [place], [place] [address]    Osmolality 03/10/2023 266 (L)  275 - 295 mOsm/kg Final   Comment: REPEATED TO VERIFY Performed at [facility], [address] [place], [place] [address]    Sodium, Ur 03/10/2023 23  mmol/L Final   Performed at [facility], [address] [place], [place] [address]   Magnesium 03/10/2023 1.3 (L)  1.7 - 2.4 mg/dL Final   Performed at [facility], [address] [place], [place] [address]   Phosphorus 03/10/2023 2.9  2.5 - 4.6 mg/dL Final   Performed at [facility], [address] [place], [place] [address]   Tricyclic, Ur Screen 03/10/2023 NONE DETECTED  NONE DETECTED Final   Amphetamines, Ur Screen 03/10/2023 NONE DETECTED  NONE DETECTED Final   MDMA (Ecstasy)Ur Screen 03/10/2023 NONE DETECTED  NONE DETECTED Final   Cocaine Metabolite,Ur [place] 03/10/2023 NONE DETECTED  NONE DETECTED Final   Opiate, Ur Screen 03/10/2023 NONE DETECTED  NONE DETECTED Final   Phencyclidine (PCP) Ur S 03/10/2023 NONE DETECTED  NONE DETECTED Final   Cannabinoid 50 Ng, Ur [place] 03/10/2023 NONE DETECTED  NONE DETECTED Final   Barbiturates, Ur Screen 03/10/2023 NONE DETECTED  NONE DETECTED Final   Benzodiazepine, Ur Scrn 03/10/2023 NONE DETECTED  NONE DETECTED Final   Methadone Scn, Ur 03/10/2023 NONE DETECTED  NONE DETECTED Final   Comment: (NOTE) Tricyclics + metabolites, urine    Cutoff 1000 ng/mL Amphetamines + metabolites, urine  Cutoff 1000 ng/mL MDMA (Ecstasy), urine              Cutoff 500 ng/mL Cocaine Metabolite, urine          Cutoff 300 ng/mL Opiate + metabolites, urine        Cutoff 300 ng/mL Phencyclidine (PCP), urine         Cutoff 25 ng/mL Cannabinoid, urine                  Cutoff 50 ng/mL Barbiturates + metabolites, urine  Cutoff 200 ng/mL Benzodiazepine, urine              Cutoff 200 ng/mL Methadone, urine                   Cutoff 300 ng/mL  The urine drug screen provides only a preliminary, unconfirmed analytical test result and should not be used for non-medical purposes. Clinical consideration and professional judgment should be applied to any positive drug screen result due to possible interfering substances. A more specific alternate chemical method must be used in order to obtain a confirmed analytical result. Gas chromatography / mass spectrometry (GC/MS) is the preferred confirm                          atory method. Performed at [facility], [address] [place], [place] [address]    B Natriuretic Peptide 03/10/2023 117.7 (H)  0.0 - 100.0 pg/mL Final   Performed at [facility], [address] [place], [place] [address]   WBC MORPHOLOGY 03/10/2023 MORPHOLOGY UNREMARKABLE   Final   RBC MORPHOLOGY 03/10/2023 MORPHOLOGY UNREMARKABLE   Final   Plt Morphology 03/10/2023 PLATELETS APPEAR DECREASED   Final   Clinical Information 03/10/2023 Thrombocytopenia   Final   Performed at [facility], [address] [place], [place] [address]   aPTT 03/10/2023 27  24 - 36 seconds Final   Performed at [facility], [address] [place], [place] [address]   Troponin I (High Sensitivity) 03/10/2023 39 (H)  <18 ng/L Final   Comment: (NOTE) Elevated high sensitivity troponin I (hsTnI) values and significant  changes across serial measurements may suggest ACS but many other  chronic and acute conditions are known to elevate hsTnI results.  Refer to the "Links" section for chest pain algorithms and additional  guidance. Performed at [facility], [address] [place], [place] [address]    Glucose-Capillary 03/10/2023 199 (H)  70 - 99 mg/dL Final   Glucose reference range applies only to  samples taken after fasting for at least 8 hours.   WBC 03/11/2023 3.9 (L)  4.0 - 10.5 K/uL Final   RBC 03/11/2023 RESULTS UNAVAILABLE DUE TO INTERFERING SUBSTANCE  4.22 - 5.81 MIL/uL Final   Hemoglobin 03/11/2023 14.0  13.0 - 17.0 g/dL Final   HCT 53/66/4403 RESULTS UNAVAILABLE DUE TO INTERFERING SUBSTANCE  39.0 - 52.0 % Final   MCV 03/11/2023 RESULTS UNAVAILABLE DUE TO INTERFERING SUBSTANCE  80.0 - 100.0 fL Final   MCH 03/11/2023 RESULTS UNAVAILABLE DUE TO INTERFERING SUBSTANCE  26.0 - 34.0 pg Final   MCHC 03/11/2023 RESULTS UNAVAILABLE DUE TO INTERFERING SUBSTANCE  30.0 - 36.0 g/dL Final   RDW 47/42/5956 RESULTS UNAVAILABLE DUE TO INTERFERING SUBSTANCE  11.5 - 15.5 % Final   Platelets 03/11/2023 92 (L)  150 - 400 K/uL Final   Comment: Immature Platelet Fraction may be clinically indicated, consider ordering this additional test [id number]    nRBC 03/11/2023 RESULTS UNAVAILABLE DUE TO INTERFERING SUBSTANCE  0.0 - 0.2 % Final   Performed at [facility], [address] [place], [place] [address]   Magnesium 03/11/2023 1.9  1.7 - 2.4 mg/dL Final   Performed at [facility], [address] [place], [place] [address]   Troponin I (High Sensitivity) 03/11/2023 41 (H)  <18 ng/L Final   Comment: (NOTE) Elevated high sensitivity troponin I (hsTnI) values and significant  changes across serial measurements may suggest ACS but many other  chronic and acute conditions are known to elevate hsTnI results.  Refer to the "Links" section for chest pain algorithms and additional  guidance. Performed at [facility], [address] [place], [place] [address]    Hgb A1c MFr Bld 03/11/2023 7.2 (H)  4.8 - 5.6 % Final   Comment: (NOTE) Pre diabetes:          5.7%-6.4%  Diabetes:              >6.4%  Glycemic control for   <7.0% adults with diabetes    Mean Plasma Glucose 03/11/2023 159.94  mg/dL Final   Performed at [facility], [address],  [place], [place] [address]   Glucose-Capillary 03/10/2023 230 (H)  70 - 99 mg/dL Final   Glucose reference range applies only to samples taken after fasting for at least 8 hours.   Sodium 03/11/2023 130 (L)  135 - 145 mmol/L Final   Potassium 03/11/2023 3.2 (L)  3.5 - 5.1 mmol/L Final   Chloride 03/11/2023 96 (L)  98 - 111 mmol/L Final   CO2 03/11/2023 26  22 - 32 mmol/L Final   Glucose, Bld 03/11/2023 222 (H)  70 - 99 mg/dL Final   Glucose reference range applies only to samples taken after fasting for at least 8 hours.   BUN 03/11/2023 8  6 - 20 mg/dL Final   Creatinine, Ser 03/11/2023 0.60 (L)  0.61 - 1.24 mg/dL Final   Calcium 16/05/9603 7.9 (L)  8.9 - 10.3 mg/dL Final   GFR, Estimated 03/11/2023 >60  >60 mL/min Final   Comment: (NOTE) Calculated using the CKD-EPI Creatinine Equation (2021)    Anion gap 03/11/2023 8  5 - 15 Final   Performed at [facility], [address] [place], [place] [address]   HIV Screen 4th Generation wRfx 03/11/2023 Non Reactive  Non Reactive Final   Performed at [facility], [address], [place], [place] [address]   Cholesterol 03/11/2023 142  0 - 200 mg/dL Final   Triglycerides 78/29/5621 84  <150 mg/dL Final   HDL 30/86/5784 80  >40 mg/dL Final   Total CHOL/HDL Ratio 03/11/2023 1.8  RATIO Final   VLDL 03/11/2023 17  0 - 40 mg/dL Final   LDL Cholesterol 03/11/2023 45  0 - 99 mg/dL Final   Comment:        Total Cholesterol/HDL:CHD Risk Coronary Heart Disease Risk Table                     Men   Women  1/2 Average Risk   3.4   3.3  Average Risk       5.0   4.4  2 X Average Risk   9.6   7.1  3 X Average Risk  23.4   11.0        Use the calculated Patient Ratio above and the CHD Risk Table to determine the patient's CHD Risk.        ATP III CLASSIFICATION (LDL):  <100     mg/dL   Optimal  696-295  mg/dL   Near or Above                    Optimal  130-159  mg/dL   Borderline  284-132  mg/dL   High  >440     mg/dL   Very  High Performed at [facility], [address] [place], [place] [address]    LDH 03/11/2023 245 (H)  98 - 192 U/L Final   Performed at [facility], [address] [place], [place] [address]   Phosphorus 03/11/2023 2.3 (L)  2.5 - 4.6 mg/dL Final   Performed at [facility], [address] [place], [place] [address]   Glucose-Capillary 03/11/2023 189 (H)  70 - 99 mg/dL Final   Glucose reference range applies only to samples taken after fasting for at least 8 hours.   Glucose-Capillary 03/11/2023 123 (H)  70 - 99 mg/dL Final   Glucose reference range applies only to samples taken after fasting for at least 8 hours.  Admission on 03/07/2023, Discharged on 03/07/2023  Component Date Value Ref Range Status   Sodium 03/07/2023 132 (L)  135 - 145 mmol/L Final   Potassium 03/07/2023 4.5  3.5 - 5.1 mmol/L Final   Chloride 03/07/2023 98  98 - 111 mmol/L Final   CO2 03/07/2023 22  22 - 32 mmol/L Final   Glucose, Bld 03/07/2023 173 (H)  70 - 99 mg/dL Final   Glucose reference range applies only to samples taken after fasting for at least 8 hours.   BUN 03/07/2023 10  6 - 20 mg/dL Final   Creatinine, Ser 03/07/2023 0.62  0.61 - 1.24 mg/dL Final   Calcium 42/59/5638 8.0 (L)  8.9 - 10.3 mg/dL Final   Total Protein 75/64/3329 5.5 (L)  6.5 - 8.1 g/dL  Final   Albumin 03/07/2023 3.2 (L)  3.5 - 5.0 g/dL Final   AST 40/98/1191 120 (H)  15 - 41 U/L Final   ALT 03/07/2023 53 (H)  0 - 44 U/L Final   Alkaline Phosphatase 03/07/2023 73  38 - 126 U/L Final   Total Bilirubin 03/07/2023 0.8  0.3 - 1.2 mg/dL Final   GFR, Estimated 03/07/2023 >60  >60 mL/min Final   Comment: (NOTE) Calculated using the CKD-EPI Creatinine Equation (2021)    Anion gap 03/07/2023 12  5 - 15 Final   Performed at [facility], [address] [place], [place] [address]   Alcohol, Ethyl (B) 03/07/2023 463 (HH)  <10 mg/dL Final   Comment: CRITICAL RESULT CALLED TO, READ BACK BY AND VERIFIED  WITH AMBER PAYNE AT 1733 ON 03/06/13 BY SS (NOTE) Lowest detectable limit for serum alcohol is 10 mg/dL.  For medical purposes only. Performed at [facility], [address] [place], [place] [address]    WBC 03/07/2023 3.9 (L)  4.0 - 10.5 K/uL Final   RBC 03/07/2023 RESULTS UNAVAILABLE DUE TO INTERFERING SUBSTANCE  4.22 - 5.81 MIL/uL Final   Hemoglobin 03/07/2023 15.4  13.0 - 17.0 g/dL Final   HCT 08/65/7846 RESULTS UNAVAILABLE DUE TO INTERFERING SUBSTANCE  39.0 - 52.0 % Final   MCV 03/07/2023 RESULTS UNAVAILABLE DUE TO INTERFERING SUBSTANCE  80.0 - 100.0 fL Final   MCH 03/07/2023 RESULTS UNAVAILABLE DUE TO INTERFERING SUBSTANCE  26.0 - 34.0 pg Final   MCHC 03/07/2023 RESULTS UNAVAILABLE DUE TO INTERFERING SUBSTANCE  30.0 - 36.0 g/dL Final   RDW 96/29/5284 RESULTS UNAVAILABLE DUE TO INTERFERING SUBSTANCE  11.5 - 15.5 % Final   Platelets 03/07/2023 139 (L)  150 - 400 K/uL Final   nRBC 03/07/2023 RESULTS UNAVAILABLE DUE TO INTERFERING SUBSTANCE  0.0 - 0.2 % Final   Performed at [facility], [address] [place], [place] [address]    Blood Alcohol level:  Lab Results  Component Value Date   ETH 125 (H) 04/18/2023   ETH <10 04/16/2023    Metabolic Disorder Labs: Lab Results  Component Value Date   HGBA1C 7.2 (H) 03/11/2023   MPG 159.94 03/11/2023   MPG 145.59 03/20/2022   No results found for: "PROLACTIN" Lab Results  Component Value Date   CHOL 183 04/18/2023   TRIG 91 04/18/2023   HDL 98 04/18/2023   CHOLHDL 1.9 04/18/2023   VLDL 18 04/18/2023   LDLCALC 67 04/18/2023   LDLCALC 45 03/11/2023    Therapeutic Lab Levels: No results found for: "LITHIUM" No results found for: "VALPROATE" No results found for: "CBMZ"  Physical Findings   AUDIT    Flowsheet Row ED from 04/18/2023 in [facility] Admission (Discharged) from 09/12/2018 in [facility] INPAT[facility]IORAL MEDICINE  Alcohol Use Disorder Identification Test Final  Score (AUDIT) 31 0      GAD-7    Flowsheet Row Office Visit from 03/09/2022 in [facility] Office Visit from 10/16/2021 in [facility] Office Visit from 06/05/2021 in [facility] Office Visit from 04/04/2021 in [facility] Office Visit from 02/06/2021 in [facility]  Total GAD-7 Score 3 7 5 4 7       PHQ2-9    Flowsheet Row ED from 04/18/2023 in [facility] Most recent reading at 04/18/2023  2:39 PM ED from 04/18/2023 in  [facility] Most recent reading at 04/18/2023  2:18 PM Office Visit from 03/09/2022 in [facility] Most recent reading at 03/09/2022  1:49 PM Clinical Support from 10/25/2021 in [facility] Most recent reading at 10/25/2021  2:08 PM Office Visit from 10/16/2021 in [facility] Most recent reading at 10/16/2021  9:36 AM  PHQ-2 Total Score 2 2 3 1 2   PHQ-9 Total Score 16 16 8 8 5       Flowsheet Row ED from 04/18/2023 in [facility] Most recent reading at 04/18/2023  2:56 PM ED from 04/18/2023 in [facility] Most recent reading at 04/18/2023  1:41 PM ED to Hosp-Admission (Discharged) from 04/15/2023 in [facility] (1A) Most recent reading at 04/16/2023  2:00 AM  C-SSRS RISK CATEGORY No Risk No Risk No Risk       Psychiatric Specialty Exam: Physical Exam Constitutional:      Appearance: the patient is not toxic-appearing.  Pulmonary:     Effort: Pulmonary effort is normal.  Neurological:     General: No focal deficit present.     Mental Status: the patient is alert and oriented to person, place, and time.   Review of Systems  Respiratory:  Negative for shortness of breath.   Cardiovascular:  Negative for chest pain.   Gastrointestinal:  Negative for abdominal pain, constipation, diarrhea, nausea and vomiting.  Neurological:  Negative for headaches.      BP (!) 138/102 (BP Location: Left Arm)   Pulse 92   Temp 99.2 F (37.3 C) (Oral)   Resp 16   SpO2 100%   General Appearance: Fairly Groomed  Eye Contact:  Good  Speech:  Clear and Coherent  Volume:  Normal  Mood:  Euthymic  Affect:  Congruent  Thought Process:  Coherent  Orientation:  Full (Time, Place, and Person)  Thought Content: Logical   Suicidal Thoughts:  No  Homicidal Thoughts:  No  Memory:  Immediate;   Good  Judgement:  fair  Insight:  fair  Psychomotor Activity:  Normal  Concentration:  Concentration: Good  Recall:  Good  Fund of Knowledge: Good  Language: Good  Akathisia:  No  Handed:  not assessed  AIMS (if indicated): not done  Assets:  Communication Skills Desire for Improvement Housing Leisure Time Physical Health  ADL's:  Intact  Cognition: WNL  Sleep:  Fair    Treatment Plan Summary: Daily contact with patient to assess and evaluate symptoms and progress in treatment and Medication management  Alcohol use disorder - Continue Ativan taper as ordered - Continue as needed Ativan - Ativan x 1 now based on vital signs and subjective report of withdrawal - Patient is already on a significant dose of gabapentin, so we will not increase this medication - Continue gabapentin 800 mg 4 times daily, home medication - Residential rehab placement, CSW has been notified, [place] treatment center may be a good option  Chronic hyponatremia - Most recent sodium of 127 on 8/22, etiology is almost certainly poor solute intake due to alcohol use disorder, the patient appears to be asymptomatic.  This has been a problem for the patient for many years. -Recheck sodium today  Prolonged QT interval - Most recent QTc 486, will avoid QT prolonging medications - Recheck EKG today  Carlyn Reichert, MD 04/20/2023 10:36 AM

## 2023-04-20 NOTE — Group Note (Signed)
Group Topic: Recovery Basics  Group Date: 04/20/2023 Start Time: 1600 End Time: 1620 Facilitators: Jenean Lindau, RN  Department: [facility]  Number of Participants: 3  Group Focus: chemical dependency education Treatment Modality:  Behavior Modification Therapy Interventions utilized were patient education Purpose: increase insight  Name: Tyler Martinez Date of Birth: Feb 11, 1971  MR: [id number]    Level of Participation: moderate Quality of Participation: attentive Interactions with others: gave feedback Mood/Affect: appropriate Triggers (if applicable):   Cognition: coherent/clear Progress: Gaining insight Response:   Plan: follow-up needed  Patients Problems:  Patient Active Problem List   Diagnosis Date Noted   Dizziness 04/16/2023   Cough 04/15/2023   HLD (hyperlipidemia) 03/10/2023   Diarrhea 03/10/2023   Abnormal LFTs 03/10/2023   Thrombocytopenia (HCC) 03/10/2023   Abnormal liver function 03/10/2023   Hypomagnesemia 03/10/2023   Sepsis (HCC) 04/18/2022   Cellulitis of left forearm 04/18/2022   MSSA bacteremia    Anxiety 04/10/2022   Arthritis 04/10/2022   ED (erectile dysfunction) 04/10/2022   Insomnia 04/10/2022   Supraumbilical hernia without gangrene and without obstruction    Left inguinal hernia    Gastritis, erosive    Abdominal bloating    Adenomatous polyp of colon    Colon cancer screening    Hypomagnesuria 07/29/2021   Hyponatremia 06/26/2021   Alcohol withdrawal (HCC) 06/26/2021   Polysubstance abuse (HCC) 06/26/2021   Incidental adrenal cortical adenoma 06/26/2021   Abnormal nuclear stress test    Chest pain 05/18/2021   CAD (coronary artery disease) 05/18/2021   Tobacco use disorder 05/18/2021   History of substance abuse (HCC) 01/12/2020   Alcohol dependence (HCC) 01/12/2020   Coronary artery disease involving native coronary artery of native heart without angina pectoris 04/14/2019   Essential  hypertension 04/14/2019   Hyperlipidemia associated with type 2 diabetes mellitus (HCC) 04/14/2019   History of cerebrovascular accident (CVA) with residual deficit 04/14/2019   Residual cognitive deficit as late effect of stroke 04/14/2019   Type 2 diabetes mellitus with vascular disease (HCC) 04/14/2019   Diabetic polyneuropathy associated with type 2 diabetes mellitus (HCC) 04/14/2019   Rheumatoid arthritis involving multiple sites (HCC) 04/14/2019   Flat foot 04/14/2019   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   Concentration deficit 07/29/2018   Non compliance w medication regimen 05/22/2018   Laceration of left wrist 10/04/2017   Major depressive disorder, 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

## 2023-04-20 NOTE — Group Note (Signed)
Group Topic: Communication  Group Date: 04/20/2023 Start Time: 1800 End Time: 1820 Facilitators: Vonzell Schlatter B  Department: [facility]  Number of Participants: 3  Group Focus: communication Treatment Modality:  Psychoeducation Interventions utilized were support Purpose: express feelings and increase insight  Name: Tyler Martinez Date of Birth: [birth year]  MR: [id number]    Level of Participation: moderate Quality of Participation: attentive and cooperative Interactions with others: ok Mood/Affect: positive Triggers (if applicable): n/a Cognition: coherent/clear Progress: Moderate Response: n/a Plan: follow-up needed  Patients Problems:  Patient Active Problem List   Diagnosis Date Noted   Dizziness 04/16/2023   Cough 04/15/2023   HLD (hyperlipidemia) 03/10/2023   Diarrhea 03/10/2023   Abnormal LFTs 03/10/2023   Thrombocytopenia (HCC) 03/10/2023   Abnormal liver function 03/10/2023   Hypomagnesemia 03/10/2023   Sepsis (HCC) 04/18/2022   Cellulitis of left forearm 04/18/2022   MSSA bacteremia    Anxiety 04/10/2022   Arthritis 04/10/2022   ED (erectile dysfunction) 04/10/2022   Insomnia 04/10/2022   Supraumbilical hernia without gangrene and without obstruction    Left inguinal hernia    Gastritis, erosive    Abdominal bloating    Adenomatous polyp of colon    Colon cancer screening    Hypomagnesuria 07/29/2021   Hyponatremia 06/26/2021   Alcohol withdrawal (HCC) 06/26/2021   Polysubstance abuse (HCC) 06/26/2021   Incidental adrenal cortical adenoma 06/26/2021   Abnormal nuclear stress test    Chest pain 05/18/2021   CAD (coronary artery disease) 05/18/2021   Tobacco use disorder 05/18/2021   History of substance abuse (HCC) 01/12/2020   Alcohol dependence (HCC) 01/12/2020   Coronary artery disease involving native coronary artery of native heart without angina pectoris 04/14/2019   Essential hypertension 04/14/2019    Hyperlipidemia associated with type 2 diabetes mellitus (HCC) 04/14/2019   History of cerebrovascular accident (CVA) with residual deficit 04/14/2019   Residual cognitive deficit as late effect of stroke 04/14/2019   Type 2 diabetes mellitus with vascular disease (HCC) 04/14/2019   Diabetic polyneuropathy associated with type 2 diabetes mellitus (HCC) 04/14/2019   Rheumatoid arthritis involving multiple sites (HCC) 04/14/2019   Flat foot 04/14/2019   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   Concentration deficit 07/29/2018   Non compliance w medication regimen 05/22/2018   Laceration of left wrist 10/04/2017   Major depressive disorder, 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

## 2023-04-20 NOTE — ED Notes (Signed)
Patient is the Dayroom watching TV with other colleagues currently. NAD, respirations even and unlabored. Will continue to monitor for safety.

## 2023-04-21 DIAGNOSIS — F1090 Alcohol use, unspecified, uncomplicated: Secondary | ICD-10-CM | POA: Diagnosis not present

## 2023-04-21 DIAGNOSIS — Z720 Tobacco use: Secondary | ICD-10-CM | POA: Diagnosis not present

## 2023-04-21 DIAGNOSIS — F1020 Alcohol dependence, uncomplicated: Secondary | ICD-10-CM

## 2023-04-21 DIAGNOSIS — Z800 Family history of malignant neoplasm of digestive organs: Secondary | ICD-10-CM | POA: Diagnosis not present

## 2023-04-21 DIAGNOSIS — I10 Essential (primary) hypertension: Secondary | ICD-10-CM | POA: Diagnosis not present

## 2023-04-21 DIAGNOSIS — Z9151 Personal history of suicidal behavior: Secondary | ICD-10-CM | POA: Diagnosis not present

## 2023-04-21 LAB — GLUCOSE, CAPILLARY: Glucose-Capillary: 164 mg/dL — ABNORMAL HIGH (ref 70–99)

## 2023-04-21 MED ORDER — LORAZEPAM 1 MG PO TABS
1.0000 mg | ORAL_TABLET | Freq: Four times a day (QID) | ORAL | Status: AC
  Administered 2023-04-21 (×3): 1 mg via ORAL
  Filled 2023-04-21 (×3): qty 1

## 2023-04-21 MED ORDER — LORAZEPAM 1 MG PO TABS
1.0000 mg | ORAL_TABLET | Freq: Three times a day (TID) | ORAL | Status: AC
  Administered 2023-04-22 (×3): 1 mg via ORAL
  Filled 2023-04-21 (×3): qty 1

## 2023-04-21 NOTE — Group Note (Signed)
Group Topic: Positive Affirmations  Group Date: 04/21/2023 Start Time: 1110 End Time: 1145 Facilitators: Cassandria Anger  Department: [facility]  Number of Participants: 7  Group Focus: affirmation Treatment Modality:  Psychoeducation Interventions utilized were patient education Purpose: increase insight  Name: Tyler Martinez Date of Birth: 06-20-1971  MR: [id number]    Level of Participation: moderate Quality of Participation: attentive, cooperative, engaged, and motivated Interactions with others: gave feedback Mood/Affect: appropriate, bright, and positive Triggers (if applicable): N/A Cognition: coherent/clear, goal directed, and insightful Progress: Moderate Response: N/A Plan: patient will be encouraged to continue to attend group  Patients Problems:  Patient Active Problem List   Diagnosis Date Noted   Dizziness 04/16/2023   Cough 04/15/2023   HLD (hyperlipidemia) 03/10/2023   Diarrhea 03/10/2023   Abnormal LFTs 03/10/2023   Thrombocytopenia (HCC) 03/10/2023   Abnormal liver function 03/10/2023   Hypomagnesemia 03/10/2023   Sepsis (HCC) 04/18/2022   Cellulitis of left forearm 04/18/2022   MSSA bacteremia    Anxiety 04/10/2022   Arthritis 04/10/2022   ED (erectile dysfunction) 04/10/2022   Insomnia 04/10/2022   Supraumbilical hernia without gangrene and without obstruction    Left inguinal hernia    Gastritis, erosive    Abdominal bloating    Adenomatous polyp of colon    Colon cancer screening    Hypomagnesuria 07/29/2021   Hyponatremia 06/26/2021   Alcohol withdrawal (HCC) 06/26/2021   Polysubstance abuse (HCC) 06/26/2021   Incidental adrenal cortical adenoma 06/26/2021   Abnormal nuclear stress test    Chest pain 05/18/2021   CAD (coronary artery disease) 05/18/2021   Tobacco use disorder 05/18/2021   History of substance abuse (HCC) 01/12/2020   Alcohol dependence (HCC) 01/12/2020   Coronary artery disease  involving native coronary artery of native heart without angina pectoris 04/14/2019   Essential hypertension 04/14/2019   Hyperlipidemia associated with type 2 diabetes mellitus (HCC) 04/14/2019   History of cerebrovascular accident (CVA) with residual deficit 04/14/2019   Residual cognitive deficit as late effect of stroke 04/14/2019   Type 2 diabetes mellitus with vascular disease (HCC) 04/14/2019   Diabetic polyneuropathy associated with type 2 diabetes mellitus (HCC) 04/14/2019   Rheumatoid arthritis involving multiple sites (HCC) 04/14/2019   Flat foot 04/14/2019   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   Concentration deficit 07/29/2018   Non compliance w medication regimen 05/22/2018   Laceration of left wrist 10/04/2017   Major depressive disorder, 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

## 2023-04-21 NOTE — ED Notes (Signed)
Patient A&Ox4. Denies intent to harm self/others when asked. Denies A/VH. Patient denies any physical complaints when asked. No acute distress noted. Support and encouragement provided. Routine safety checks conducted according to facility protocol. Encouraged patient to notify staff if thoughts of harm toward self or others arise. Patient verbalize understanding and agreement. Will continue to monitor for safety.    

## 2023-04-21 NOTE — ED Notes (Signed)
Patient is in the bedroom sleeping currently. NAD, respirations even and unlabored. Will continue to monitor for safety.

## 2023-04-21 NOTE — ED Notes (Signed)
Pt sitting in cafeteria eating lunch. No acute distress noted. No concerns voiced. Informed pt to notify staff with any needs or assistance. Pt verbalized understanding or agreement. Will continue to monitor for safety.

## 2023-04-21 NOTE — Group Note (Signed)
Group Topic: Fears and Unhealthy Coping Skills  Group Date: 04/21/2023 Start Time: 2030 End Time: 2100 Facilitators: Vassie Loll, RN  Department: [facility]  Number of Participants: 3  Group Focus: coping skills Treatment Modality:  Psychoeducation Interventions utilized were support Purpose: enhance coping skills  Name: Tyler Martinez Date of Birth: [birth year]  MR: [id number]    Level of Participation: active Quality of Participation: attentive Interactions with others: gave feedback Mood/Affect: appropriate Triggers (if applicable): n/a Cognition: coherent/clear Progress: Moderate Response: n/a Plan: follow-up needed  Patients Problems:  Patient Active Problem List   Diagnosis Date Noted   Dizziness 04/16/2023   Cough 04/15/2023   HLD (hyperlipidemia) 03/10/2023   Diarrhea 03/10/2023   Abnormal LFTs 03/10/2023   Thrombocytopenia (HCC) 03/10/2023   Abnormal liver function 03/10/2023   Hypomagnesemia 03/10/2023   Sepsis (HCC) 04/18/2022   Cellulitis of left forearm 04/18/2022   MSSA bacteremia    Anxiety 04/10/2022   Arthritis 04/10/2022   ED (erectile dysfunction) 04/10/2022   Insomnia 04/10/2022   Supraumbilical hernia without gangrene and without obstruction    Left inguinal hernia    Gastritis, erosive    Abdominal bloating    Adenomatous polyp of colon    Colon cancer screening    Hypomagnesuria 07/29/2021   Hyponatremia 06/26/2021   Alcohol withdrawal (HCC) 06/26/2021   Polysubstance abuse (HCC) 06/26/2021   Incidental adrenal cortical adenoma 06/26/2021   Abnormal nuclear stress test    Chest pain 05/18/2021   CAD (coronary artery disease) 05/18/2021   Tobacco use disorder 05/18/2021   History of substance abuse (HCC) 01/12/2020   Alcohol dependence (HCC) 01/12/2020   Coronary artery disease involving native coronary artery of native heart without angina pectoris 04/14/2019   Essential hypertension 04/14/2019    Hyperlipidemia associated with type 2 diabetes mellitus (HCC) 04/14/2019   History of cerebrovascular accident (CVA) with residual deficit 04/14/2019   Residual cognitive deficit as late effect of stroke 04/14/2019   Type 2 diabetes mellitus with vascular disease (HCC) 04/14/2019   Diabetic polyneuropathy associated with type 2 diabetes mellitus (HCC) 04/14/2019   Rheumatoid arthritis involving multiple sites (HCC) 04/14/2019   Flat foot 04/14/2019   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   Concentration deficit 07/29/2018   Non compliance w medication regimen 05/22/2018   Laceration of left wrist 10/04/2017   Major depressive disorder, 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

## 2023-04-21 NOTE — ED Notes (Signed)
 Patient was provided dinner

## 2023-04-21 NOTE — ED Notes (Signed)
 Patient was provided a snack

## 2023-04-21 NOTE — ED Notes (Signed)
Patient is the Dayroom watching TV with other patients. NAD. Respirations are even and unlabored. Will continue to monitor for safety.

## 2023-04-21 NOTE — ED Notes (Signed)
 Patient was provided breakfast

## 2023-04-21 NOTE — ED Notes (Signed)
Patient was provided lunch.

## 2023-04-21 NOTE — ED Provider Notes (Addendum)
Behavioral Health Progress Note  Date and Time: 04/21/2023 10:37 AM Name: Tyler Martinez MRN:  [id number]  Subjective:   The patient "Tyler Martinez" is a 52 year old male with a history of alcohol use disorder, 2 previous psychiatric hospitalizations noted in the medical record occurring several years ago, one of which was for a suicide attempt and the other for amphetamine induced psychosis.  The patient has a history of chronic hyponatremia, dating back to at least 2018, at which time he had a medical admission for the problem.  The patient presented [facility] requesting help with increasing alcohol use and request for residential rehab placement.  A pertinent psychosocial stressor is the fact that his brother, with whom he is very close, has been diagnosed with colon cancer.  On interview today, the patient reports continued and significant symptoms of withdrawal.  He states that he is experiencing nausea and tremulousness.  He appears uncomfortable throughout the interview.  The patient states that he has not been without alcohol for many years and therefore has not experienced withdrawal symptoms.  Most recent vital signs reviewed, which are consistent with continued to alcohol withdrawal.  We discussed the plan to slow down his Ativan taper and the patient expressed his understanding.  He was updated on fact that his sodium increased to 130.  He was concerned about being discharged on Monday.  We discussed that we will try to find an appropriate placement for him at a residential rehab.  He expressed his gratitude.  The patient denies auditory/visual hallucinations.  The patient reports good mood, appetite, and sleep. They deny suicidal and homicidal thoughts. The patient denies side effects from their medications.  Review of systems as below.   Diagnosis:  Final diagnoses:  Alcohol use disorder  Tobacco use disorder    Total Time spent with patient: 20 minutes  Past Psychiatric History: as  above Past Medical History: as above Family History: none Family Psychiatric  History: none Social History: as above and per H and P  Additional Social History:  See H and P                  Sleep: Fair  Appetite:  Fair   Current Medications:  Current Facility-Administered Medications  Medication Dose Route Frequency Provider Last Rate Last Admin   acetaminophen (TYLENOL) tablet 650 mg  650 mg Oral Q6H PRN Augusto Gamble, MD   650 mg at 04/21/23 0906   albuterol (VENTOLIN HFA) 108 (90 Base) MCG/ACT inhaler 2 puff  2 puff Inhalation Q6H PRN Augusto Gamble, MD       alum & mag hydroxide-simeth (MAALOX/MYLANTA) 200-200-20 MG/5ML suspension 30 mL  30 mL Oral Q4H PRN Augusto Gamble, MD       amLODipine (NORVASC) tablet 10 mg  10 mg Oral Daily Augusto Gamble, MD   10 mg at 04/21/23 7829   bismuth subsalicylate (PEPTO BISMOL) chewable tablet 524 mg  524 mg Oral Q3H PRN Augusto Gamble, MD   524 mg at 04/20/23 1024   haloperidol (HALDOL) tablet 5 mg  5 mg Oral Q6H PRN Augusto Gamble, MD       And   LORazepam (ATIVAN) tablet 1 mg  1 mg Oral Q6H PRN Augusto Gamble, MD       And   diphenhydrAMINE (BENADRYL) capsule 25 mg  25 mg Oral Q6H PRN Augusto Gamble, MD       haloperidol lactate (HALDOL) injection 5 mg  5 mg Intramuscular Q6H PRN Augusto Gamble, MD  And   LORazepam (ATIVAN) injection 1 mg  1 mg Intravenous Q6H PRN Augusto Gamble, MD       And   diphenhydrAMINE (BENADRYL) injection 25 mg  25 mg Intramuscular Q6H PRN Augusto Gamble, MD       gabapentin (NEURONTIN) capsule 800 mg  800 mg Oral QID Augusto Gamble, MD   800 mg at 04/21/23 1610   hydrOXYzine (ATARAX) tablet 25 mg  25 mg Oral TID PRN Augusto Gamble, MD   25 mg at 04/20/23 1521   lisinopril (ZESTRIL) tablet 40 mg  40 mg Oral Daily Augusto Gamble, MD   40 mg at 04/21/23 9604   LORazepam (ATIVAN) tablet 1 mg  1 mg Oral Q4H PRN Augusto Gamble, MD       Or   LORazepam (ATIVAN) injection 1 mg  1 mg Intramuscular Q4H PRN Augusto Gamble, MD       LORazepam (ATIVAN)  tablet 1 mg  1 mg Oral QID Carlyn Reichert, MD       metFORMIN (GLUCOPHAGE) tablet 1,000 mg  1,000 mg Oral BID WC Augusto Gamble, MD   1,000 mg at 04/21/23 0840   thiamine (VITAMIN B1) tablet 100 mg  100 mg Oral Daily Augusto Gamble, MD   100 mg at 04/21/23 5409   And   multivitamin with minerals tablet 1 tablet  1 tablet Oral Daily Augusto Gamble, MD   1 tablet at 04/21/23 8119   nicotine (NICODERM CQ - dosed in mg/24 hours) patch 14 mg  14 mg Transdermal Daily Lenard Lance, FNP   14 mg at 04/21/23 1478   nicotine polacrilex (NICORETTE) gum 2 mg  2 mg Oral PRN Augusto Gamble, MD       ondansetron [facility]) tablet 8 mg  8 mg Oral Q8H PRN Augusto Gamble, MD       polyethylene glycol (MIRALAX / GLYCOLAX) packet 17 g  17 g Oral Daily PRN Augusto Gamble, MD       senna (SENOKOT) tablet 8.6 mg  1 tablet Oral QHS PRN Augusto Gamble, MD       traZODone (DESYREL) tablet 50 mg  50 mg Oral QHS PRN Sindy Guadeloupe, NP   50 mg at 04/20/23 2102   Current Outpatient Medications  Medication Sig Dispense Refill   albuterol (VENTOLIN HFA) 108 (90 Base) MCG/ACT inhaler Inhale 2 puffs into the lungs every 6 (six) hours as needed for wheezing or shortness of breath. (Patient taking differently: Inhale 2 puffs into the lungs 2 (two) times daily as needed for wheezing or shortness of breath.) 8 g 2   amLODipine (NORVASC) 10 MG tablet TAKE 1 TABLET BY MOUTH EVERY DAY 90 tablet 1   folic acid (FOLVITE) 1 MG tablet Take 1 tablet (1 mg total) by mouth daily. 90 tablet 3   gabapentin (NEURONTIN) 800 MG tablet TAKE 1 TABLET BY MOUTH FOUR TIMES A DAY (Patient taking differently: Take 800 mg by mouth 4 (four) times daily.) 360 tablet 3   lisinopril (ZESTRIL) 40 MG tablet TAKE 1 TABLET BY MOUTH EVERY DAY 90 tablet 1   loperamide (IMODIUM) 2 MG capsule Take 2-4 mg by mouth as needed for diarrhea or loose stools.     metFORMIN (GLUCOPHAGE) 1000 MG tablet Take 1 tablet (1,000 mg total) by mouth 2 (two) times daily with a meal. 180 tablet 0   Multiple  Vitamin (MULTIVITAMIN WITH MINERALS) TABS tablet Take 1 tablet by mouth daily.     Naphazoline HCl (CLEAR EYES OP) Place  1-2 drops into both eyes 4 (four) times daily as needed (For eye irritation).     Simethicone (GAS-X PO) Take 3 capsules by mouth daily as needed (flatulence).     sodium chloride 1 g tablet TAKE 1 TABLET (1 G TOTAL) BY MOUTH 2 (TWO) TIMES DAILY WITH A MEAL. 60 tablet 2   thiamine (VITAMIN B1) 100 MG tablet Take 100 mg by mouth daily.     traMADol (ULTRAM) 50 MG tablet TAKE 1 TABLET BY MOUTH EVERY 8 HOURS AS NEEDED (Patient taking differently: Take 50 mg by mouth every 8 (eight) hours as needed for moderate pain.) 60 tablet 2    Labs  Lab Results:  Admission on 04/18/2023  Component Date Value Ref Range Status   Cholesterol 04/18/2023 183  0 - 200 mg/dL Final   Triglycerides 46/96/2952 91  <150 mg/dL Final   HDL 84/13/2440 98  >40 mg/dL Final   Total CHOL/HDL Ratio 04/18/2023 1.9  RATIO Final   VLDL 04/18/2023 18  0 - 40 mg/dL Final   LDL Cholesterol 04/18/2023 67  0 - 99 mg/dL Final   Comment:        Total Cholesterol/HDL:CHD Risk Coronary Heart Disease Risk Table                     Men   Women  1/2 Average Risk   3.4   3.3  Average Risk       5.0   4.4  2 X Average Risk   9.6   7.1  3 X Average Risk  23.4   11.0        Use the calculated Patient Ratio above and the CHD Risk Table to determine the patient's CHD Risk.        ATP III CLASSIFICATION (LDL):  <100     mg/dL   Optimal  102-725  mg/dL   Near or Above                    Optimal  130-159  mg/dL   Borderline  366-440  mg/dL   High  >347     mg/dL   Very High Performed at [facility], [address] [place], [place] [address]    Sodium 04/20/2023 130 (L)  135 - 145 mmol/L Final   Potassium 04/20/2023 3.9  3.5 - 5.1 mmol/L Final   Chloride 04/20/2023 96 (L)  98 - 111 mmol/L Final   CO2 04/20/2023 25  22 - 32 mmol/L Final   Glucose, Bld 04/20/2023 129 (H)  70 - 99 mg/dL Final   Glucose  reference range applies only to samples taken after fasting for at least 8 hours.   BUN 04/20/2023 8  6 - 20 mg/dL Final   Creatinine, Ser 04/20/2023 0.81  0.61 - 1.24 mg/dL Final   Calcium 63/87/5643 8.1 (L)  8.9 - 10.3 mg/dL Final   GFR, Estimated 04/20/2023 >60  >60 mL/min Final   Comment: (NOTE) Calculated using the CKD-EPI Creatinine Equation (2021)    Anion gap 04/20/2023 9  5 - 15 Final   Performed at [facility], [address], [place], [place] [address]  Admission on 04/18/2023, Discharged on 04/18/2023  Component Date Value Ref Range Status   WBC 04/18/2023 3.7 (L)  4.0 - 10.5 K/uL Final   RBC 04/18/2023 RESULTS UNAVAILABLE DUE TO INTERFERING SUBSTANCE  4.22 - 5.81 MIL/uL Final   Hemoglobin 04/18/2023 15.0  13.0 - 17.0  g/dL Final   HCT 32/44/0102 RESULTS UNAVAILABLE DUE TO INTERFERING SUBSTANCE  39.0 - 52.0 % Final   MCV 04/18/2023 RESULTS UNAVAILABLE DUE TO INTERFERING SUBSTANCE  80.0 - 100.0 fL Final   MCH 04/18/2023 RESULTS UNAVAILABLE DUE TO INTERFERING SUBSTANCE  26.0 - 34.0 pg Final   MCHC 04/18/2023 RESULTS UNAVAILABLE DUE TO INTERFERING SUBSTANCE  30.0 - 36.0 g/dL Final   RDW 72/53/6644 RESULTS UNAVAILABLE DUE TO INTERFERING SUBSTANCE  11.5 - 15.5 % Final   Platelets 04/18/2023 201  150 - 400 K/uL Final   nRBC 04/18/2023 0.0  0.0 - 0.2 % Final   Neutrophils Relative % 04/18/2023 64  % Final   Neutro Abs 04/18/2023 2.4  1.7 - 7.7 K/uL Final   Lymphocytes Relative 04/18/2023 17  % Final   Lymphs Abs 04/18/2023 0.6 (L)  0.7 - 4.0 K/uL Final   Monocytes Relative 04/18/2023 18  % Final   Monocytes Absolute 04/18/2023 0.7  0.1 - 1.0 K/uL Final   Eosinophils Relative 04/18/2023 1  % Final   Eosinophils Absolute 04/18/2023 0.0  0.0 - 0.5 K/uL Final   Basophils Relative 04/18/2023 0  % Final   Basophils Absolute 04/18/2023 0.0  0.0 - 0.1 K/uL Final   WBC Morphology 04/18/2023 MORPHOLOGY UNREMARKABLE   Final   RBC Morphology 04/18/2023 MORPHOLOGY UNREMARKABLE   Final    Smear Review 04/18/2023 PLATELET COUNT CONFIRMED BY SMEAR   Final   Abs Immature Granulocytes 04/18/2023 0.00  0.00 - 0.07 K/uL Final   Performed at [facility], [address] [place], [place] [address]   Sodium 04/18/2023 127 (L)  135 - 145 mmol/L Final   DELTA CHECK NOTED   Potassium 04/18/2023 3.6  3.5 - 5.1 mmol/L Final   Chloride 04/18/2023 87 (L)  98 - 111 mmol/L Final   CO2 04/18/2023 22  22 - 32 mmol/L Final   Glucose, Bld 04/18/2023 118 (H)  70 - 99 mg/dL Final   Glucose reference range applies only to samples taken after fasting for at least 8 hours.   BUN 04/18/2023 <5 (L)  6 - 20 mg/dL Final   Creatinine, Ser 04/18/2023 0.60 (L)  0.61 - 1.24 mg/dL Final   Calcium 25/95/6387 8.7 (L)  8.9 - 10.3 mg/dL Final   Total Protein 56/43/3295 6.0 (L)  6.5 - 8.1 g/dL Final   Albumin 18/84/1660 3.4 (L)  3.5 - 5.0 g/dL Final   AST 63/08/6008 138 (H)  15 - 41 U/L Final   ALT 04/18/2023 86 (H)  0 - 44 U/L Final   Alkaline Phosphatase 04/18/2023 90  38 - 126 U/L Final   Total Bilirubin 04/18/2023 0.7  0.3 - 1.2 mg/dL Final   GFR, Estimated 04/18/2023 >60  >60 mL/min Final   Comment: (NOTE) Calculated using the CKD-EPI Creatinine Equation (2021)    Anion gap 04/18/2023 18 (H)  5 - 15 Final   Performed at [facility], [address] [place], [place] [address]   Magnesium 04/18/2023 1.4 (L)  1.7 - 2.4 mg/dL Final   Performed at [facility], [address], [place], [place] [address]   Alcohol, Ethyl (B) 04/18/2023 125 (H)  <10 mg/dL Final   Comment: (NOTE) Lowest detectable limit for serum alcohol is 10 mg/dL.  For medical purposes only. Performed at [facility], [address] [place], [place] [address]    TSH 04/18/2023 2.888  0.350 - 4.500 uIU/mL Final   Comment: Performed by  a 3rd Generation assay with a functional sensitivity of <=0.01 uIU/mL. Performed at [facility], [address], [place], [place] [address]    POC Amphetamine UR  04/18/2023 None Detected  NONE DETECTED (Cut Off Level 1000 ng/mL) Final   POC Secobarbital (BAR) 04/18/2023 None Detected  NONE DETECTED (Cut Off Level 300 ng/mL) Final   POC Buprenorphine (BUP) 04/18/2023 None Detected  NONE DETECTED (Cut Off Level 10 ng/mL) Final   POC Oxazepam (BZO) 04/18/2023 Positive (A)  NONE DETECTED (Cut Off Level 300 ng/mL) Final   POC Cocaine UR 04/18/2023 None Detected  NONE DETECTED (Cut Off Level 300 ng/mL) Final   POC Methamphetamine UR 04/18/2023 None Detected  NONE DETECTED (Cut Off Level 1000 ng/mL) Final   POC Morphine 04/18/2023 None Detected  NONE DETECTED (Cut Off Level 300 ng/mL) Final   POC Methadone UR 04/18/2023 None Detected  NONE DETECTED (Cut Off Level 300 ng/mL) Final   POC Oxycodone UR 04/18/2023 None Detected  NONE DETECTED (Cut Off Level 100 ng/mL) Final   POC Marijuana UR 04/18/2023 None Detected  NONE DETECTED (Cut Off Level 50 ng/mL) Final  Admission on 04/15/2023, Discharged on 04/16/2023  Component Date Value Ref Range Status   Sodium 04/15/2023 124 (L)  135 - 145 mmol/L Final   Potassium 04/15/2023 3.8  3.5 - 5.1 mmol/L Final   Chloride 04/15/2023 88 (L)  98 - 111 mmol/L Final   CO2 04/15/2023 22  22 - 32 mmol/L Final   Glucose, Bld 04/15/2023 134 (H)  70 - 99 mg/dL Final   Glucose reference range applies only to samples taken after fasting for at least 8 hours.   BUN 04/15/2023 6  6 - 20 mg/dL Final   Creatinine, Ser 04/15/2023 1.08  0.61 - 1.24 mg/dL Final   Calcium 19/14/7829 8.0 (L)  8.9 - 10.3 mg/dL Final   GFR, Estimated 04/15/2023 >60  >60 mL/min Final   Comment: (NOTE) Calculated using the CKD-EPI Creatinine Equation (2021)    Anion gap 04/15/2023 14  5 - 15 Final   Performed at [facility], [address] [place], [place] [address]   WBC 04/15/2023 4.1  4.0 - 10.5 K/uL Final   RBC 04/15/2023 3.54 (L)  4.22 - 5.81 MIL/uL Final   Hemoglobin 04/15/2023 13.8  13.0 - 17.0 g/dL Final   HCT 08/65/7846 37.2 (L)  39.0 -  52.0 % Final   MCV 04/15/2023 105.1 (H)  80.0 - 100.0 fL Final   MCH 04/15/2023 39.0 (H)  26.0 - 34.0 pg Final   MCHC 04/15/2023 37.1 (H)  30.0 - 36.0 g/dL Final   RDW 96/29/5284 12.9  11.5 - 15.5 % Final   Platelets 04/15/2023 136 (L)  150 - 400 K/uL Final   nRBC 04/15/2023 0.0  0.0 - 0.2 % Final   Performed at [facility], [address] [place], [place] [address]   Troponin I (High Sensitivity) 04/15/2023 17  <18 ng/L Final   Comment: (NOTE) Elevated high sensitivity troponin I (hsTnI) values and significant  changes across serial measurements may suggest ACS but many other  chronic and acute conditions are known to elevate hsTnI results.  Refer to the "Links" section for chest pain algorithms and additional  guidance. Performed at [facility], [address] [place], [place] [address]    SARS Coronavirus 2 by RT PCR 04/15/2023 NEGATIVE  NEGATIVE Final   Comment: (NOTE) SARS-CoV-2 target nucleic acids are NOT DETECTED.  The SARS-CoV-2 RNA  is generally detectable in upper and lower respiratory specimens during the acute phase of infection. The lowest concentration of SARS-CoV-2 viral copies this assay can detect is 250 copies / mL. A negative result does not preclude SARS-CoV-2 infection and should not be used as the sole basis for treatment or other patient management decisions.  A negative result may occur with improper specimen collection / handling, submission of specimen other than nasopharyngeal swab, presence of viral mutation(s) within the areas targeted by this assay, and inadequate number of viral copies (<250 copies / mL). A negative result must be combined with clinical observations, patient history, and epidemiological information.  Fact Sheet for Patients:   RoadLapTop.co.za  Fact Sheet for Healthcare Providers: http://kim-miller.com/  This test is not yet approved or                            cleared by the [place] FDA and has been authorized for detection and/or diagnosis of SARS-CoV-2 by FDA under an Emergency Use Authorization (EUA).  This EUA will remain in effect (meaning this test can be used) for the duration of the COVID-19 declaration under Section 564(b)(1) of the Act, 21 U.S.C. section 360bbb-3(b)(1), unless the authorization is terminated or revoked sooner.  Performed at [facility], [address] [place], [place] [address]    Lactic Acid, Venous 04/15/2023 2.2 (HH)  0.5 - 1.9 mmol/L Final   Comment: CRITICAL RESULT CALLED TO, READ BACK BY AND VERIFIED WITH ELLEN FLUECKIGER 1453 04/15/23 MU Performed at [facility], [address] [place], [place] [address]    Lactic Acid, Venous 04/15/2023 1.5  0.5 - 1.9 mmol/L Final   Performed at [facility], [address] [place], [place] [address]   Specimen Description 04/15/2023 BLOOD RIGTH [facility]   Final   Special Requests 04/15/2023 BOTTLES DRAWN AEROBIC AND ANAEROBIC Blood Culture adequate volume   Final   Culture 04/15/2023    Final                   Value:NO GROWTH 5 DAYS Performed at [facility], [address], [place], [place] [address]    Report Status 04/15/2023 04/20/2023 FINAL   Final   Specimen Description 04/15/2023 BLOOD BLOOD LEFT ARM   Final   Special Requests 04/15/2023 BOTTLES DRAWN AEROBIC AND ANAEROBIC Blood Culture adequate volume   Final   Culture 04/15/2023    Final                   Value:NO GROWTH 5 DAYS Performed at [facility], [address], [place], [place] [address]    Report Status 04/15/2023 04/20/2023 FINAL   Final   Troponin I (High Sensitivity) 04/15/2023 15  <18 ng/L Final   Comment: (NOTE) Elevated high sensitivity troponin I (hsTnI) values and significant  changes across serial measurements may suggest ACS but many other  chronic and acute conditions are known to elevate hsTnI results.  Refer to the "Links" section  for chest pain algorithms and additional  guidance. Performed at [facility], [address] [place], [place] [address]    Sodium 04/16/2023 135  135 - 145 mmol/L Final   Potassium 04/16/2023 3.9  3.5 - 5.1 mmol/L Final   Chloride 04/16/2023 99  98 - 111 mmol/L Final   CO2 04/16/2023 27  22 - 32 mmol/L Final   Glucose, Bld 04/16/2023 137 (H)  70 - 99  mg/dL Final   Glucose reference range applies only to samples taken after fasting for at least 8 hours.   BUN 04/16/2023 7  6 - 20 mg/dL Final   Creatinine, Ser 04/16/2023 0.70  0.61 - 1.24 mg/dL Final   Calcium 81/19/1478 8.6 (L)  8.9 - 10.3 mg/dL Final   GFR, Estimated 04/16/2023 >60  >60 mL/min Final   Comment: (NOTE) Calculated using the CKD-EPI Creatinine Equation (2021)    Anion gap 04/16/2023 9  5 - 15 Final   Performed at [facility], [address] [place], [place] [address]   WBC 04/16/2023 4.4  4.0 - 10.5 K/uL Final   RBC 04/16/2023 3.78 (L)  4.22 - 5.81 MIL/uL Final   Hemoglobin 04/16/2023 14.7  13.0 - 17.0 g/dL Final   HCT 13/03/6577 40.3  39.0 - 52.0 % Final   MCV 04/16/2023 106.6 (H)  80.0 - 100.0 fL Final   MCH 04/16/2023 38.9 (H)  26.0 - 34.0 pg Final   MCHC 04/16/2023 36.5 (H)  30.0 - 36.0 g/dL Final   RDW 46/96/2952 13.2  11.5 - 15.5 % Final   Platelets 04/16/2023 137 (L)  150 - 400 K/uL Final   nRBC 04/16/2023 0.0  0.0 - 0.2 % Final   Performed at [facility], [address] [place], [place] [address]   Procalcitonin 04/15/2023 0.16  ng/mL Final   Comment:        Interpretation: PCT (Procalcitonin) <= 0.5 ng/mL: Systemic infection (sepsis) is not likely. Local bacterial infection is possible. (NOTE)       Sepsis PCT Algorithm           Lower Respiratory Tract                                      Infection PCT Algorithm    ----------------------------     ----------------------------         PCT < 0.25 ng/mL                PCT < 0.10 ng/mL          Strongly encourage              Strongly discourage   discontinuation of antibiotics    initiation of antibiotics    ----------------------------     -----------------------------       PCT 0.25 - 0.50 ng/mL            PCT 0.10 - 0.25 ng/mL               OR       >80% decrease in PCT            Discourage initiation of                                            antibiotics      Encourage discontinuation           of antibiotics    ----------------------------     -----------------------------         PCT >= 0.50 ng/mL              PCT 0.26 - 0.50 ng/mL               AND                                 <  80% decrease in PCT             Encourage initiation of                                             antibiotics       Encourage continuation           of antibiotics    ----------------------------     -----------------------------        PCT >= 0.50 ng/mL                  PCT > 0.50 ng/mL               AND         increase in PCT                  Strongly encourage                                      initiation of antibiotics    Strongly encourage escalation           of antibiotics                                     -----------------------------                                           PCT <= 0.25 ng/mL                                                 OR                                        > 80% decrease in PCT                                      Discontinue / Do not initiate                                             antibiotics  Performed at [facility], [address] [place], [place] [address]    Procalcitonin 04/16/2023 0.11  ng/mL Final   Comment:        Interpretation: PCT (Procalcitonin) <= 0.5 ng/mL: Systemic infection (sepsis) is not likely. Local bacterial infection is possible. (NOTE)       Sepsis PCT Algorithm           Lower Respiratory Tract  Infection PCT Algorithm    ----------------------------      ----------------------------         PCT < 0.25 ng/mL                PCT < 0.10 ng/mL          Strongly encourage             Strongly discourage   discontinuation of antibiotics    initiation of antibiotics    ----------------------------     -----------------------------       PCT 0.25 - 0.50 ng/mL            PCT 0.10 - 0.25 ng/mL               OR       >80% decrease in PCT            Discourage initiation of                                            antibiotics      Encourage discontinuation           of antibiotics    ----------------------------     -----------------------------         PCT >= 0.50 ng/mL              PCT 0.26 - 0.50 ng/mL               AND                                 <80% decrease in PCT             Encourage initiation of                                             antibiotics       Encourage continuation           of antibiotics    ----------------------------     -----------------------------        PCT >= 0.50 ng/mL                  PCT > 0.50 ng/mL               AND         increase in PCT                  Strongly encourage                                      initiation of antibiotics    Strongly encourage escalation           of antibiotics                                     -----------------------------  PCT <= 0.25 ng/mL                                                 OR                                        > 80% decrease in PCT                                      Discontinue / Do not initiate                                             antibiotics  Performed at [facility], [address] [place], [place] [address]    Glucose-Capillary 04/15/2023 157 (H)  70 - 99 mg/dL Final   Glucose reference range applies only to samples taken after fasting for at least 8 hours.   Glucose-Capillary 04/16/2023 141 (H)  70 - 99 mg/dL Final   Glucose reference range applies only to samples taken  after fasting for at least 8 hours.   Phosphorus 04/16/2023 3.6  2.5 - 4.6 mg/dL Final   Performed at [facility], [address] [place], [place] [address]   Magnesium 04/16/2023 1.6 (L)  1.7 - 2.4 mg/dL Final   Performed at [facility], [address] [place], [place] [address]   Alcohol, Ethyl (B) 04/16/2023 <10  <10 mg/dL Final   Comment: (NOTE) Lowest detectable limit for serum alcohol is 10 mg/dL.  For medical purposes only. Performed at [facility], [address] [place], [place] [address]   Admission on 03/11/2023, Discharged on 03/11/2023  Component Date Value Ref Range Status   WBC 03/11/2023 3.7 (L)  4.0 - 10.5 K/uL Final   RBC 03/11/2023 3.93 (L)  4.22 - 5.81 MIL/uL Final   Hemoglobin 03/11/2023 14.7  13.0 - 17.0 g/dL Final   HCT 86/57/8469 40.6  39.0 - 52.0 % Final   MCV 03/11/2023 103.3 (H)  80.0 - 100.0 fL Final   MCH 03/11/2023 37.4 (H)  26.0 - 34.0 pg Final   MCHC 03/11/2023 36.2 (H)  30.0 - 36.0 g/dL Final   RDW 62/95/2841 12.5  11.5 - 15.5 % Final   Platelets 03/11/2023 100 (L)  150 - 400 K/uL Final   Comment: Immature Platelet Fraction may be clinically indicated, consider ordering this additional test [id number] REPEATED TO VERIFY PLATELET COUNT CONFIRMED BY SMEAR    nRBC 03/11/2023 0.0  0.0 - 0.2 % Final   Neutrophils Relative % 03/11/2023 72  % Final   Neutro Abs 03/11/2023 2.7  1.7 - 7.7 K/uL Final   Lymphocytes Relative 03/11/2023 15  % Final   Lymphs Abs 03/11/2023 0.6 (L)  0.7 - 4.0 K/uL Final   Monocytes Relative 03/11/2023 10  % Final   Monocytes Absolute 03/11/2023 0.4  0.1 - 1.0 K/uL Final   Eosinophils Relative 03/11/2023 1  % Final   Eosinophils Absolute 03/11/2023 0.0  0.0 - 0.5 K/uL Final   Basophils Relative 03/11/2023 1  % Final  Basophils Absolute 03/11/2023 0.0  0.0 - 0.1 K/uL Final   Immature Granulocytes 03/11/2023 1  % Final   Abs Immature Granulocytes 03/11/2023 0.02  0.00 - 0.07 K/uL Final    Performed at [facility], [address] [place], [place] [address]   Troponin I (High Sensitivity) 03/11/2023 37 (H)  <18 ng/L Final   Comment: (NOTE) Elevated high sensitivity troponin I (hsTnI) values and significant  changes across serial measurements may suggest ACS but many other  chronic and acute conditions are known to elevate hsTnI results.  Refer to the "Links" section for chest pain algorithms and additional  guidance. Performed at [facility], [address] [place], [place] [address]    Total CK 03/11/2023 173  49 - 397 U/L Final   Performed at [facility], [address] [place], [place] [address]   Sodium 03/11/2023 132 (L)  135 - 145 mmol/L Final   Potassium 03/11/2023 3.5  3.5 - 5.1 mmol/L Final   Chloride 03/11/2023 100  98 - 111 mmol/L Final   CO2 03/11/2023 22  22 - 32 mmol/L Final   Glucose, Bld 03/11/2023 251 (H)  70 - 99 mg/dL Final   Glucose reference range applies only to samples taken after fasting for at least 8 hours.   BUN 03/11/2023 9  6 - 20 mg/dL Final   Creatinine, Ser 03/11/2023 0.72  0.61 - 1.24 mg/dL Final   Calcium 91/47/8295 8.4 (L)  8.9 - 10.3 mg/dL Final   Total Protein 62/13/0865 6.4 (L)  6.5 - 8.1 g/dL Final   Albumin 78/46/9629 3.6  3.5 - 5.0 g/dL Final   AST 52/84/1324 57 (H)  15 - 41 U/L Final   ALT 03/11/2023 40  0 - 44 U/L Final   Alkaline Phosphatase 03/11/2023 72  38 - 126 U/L Final   Total Bilirubin 03/11/2023 1.5 (H)  0.3 - 1.2 mg/dL Final   GFR, Estimated 03/11/2023 >60  >60 mL/min Final   Comment: (NOTE) Calculated using the CKD-EPI Creatinine Equation (2021)    Anion gap 03/11/2023 10  5 - 15 Final   Performed at [facility], [address] [place], [place] [address]   Troponin I (High Sensitivity) 03/11/2023 48 (H)  <18 ng/L Final   Comment: (NOTE) Elevated high sensitivity troponin I (hsTnI) values and significant  changes across serial measurements may suggest ACS but many  other  chronic and acute conditions are known to elevate hsTnI results.  Refer to the "Links" section for chest pain algorithms and additional  guidance. Performed at [facility], [address] [place], [place] [address]   Admission on 03/10/2023, Discharged on 03/11/2023  Component Date Value Ref Range Status   Lipase 03/10/2023 49  11 - 51 U/L Final   Performed at [facility], [address] [place], [place] [address]   Sodium 03/10/2023 124 (L)  135 - 145 mmol/L Final   Potassium 03/10/2023 4.1  3.5 - 5.1 mmol/L Final   Chloride 03/10/2023 84 (L)  98 - 111 mmol/L Final   CO2 03/10/2023 25  22 - 32 mmol/L Final   Glucose, Bld 03/10/2023 247 (H)  70 - 99 mg/dL Final   Glucose reference range applies only to samples taken after fasting for at least 8 hours.   BUN 03/10/2023 5 (L)  6 - 20 mg/dL Final   Creatinine, Ser 03/10/2023 0.67  0.61 - 1.24 mg/dL Final   Calcium 47/42/5956 8.9  8.9 -  10.3 mg/dL Final   Total Protein 95/63/8756 7.5  6.5 - 8.1 g/dL Final   Albumin 43/32/9518 4.2  3.5 - 5.0 g/dL Final   AST 84/16/6063 88 (H)  15 - 41 U/L Final   ALT 03/10/2023 51 (H)  0 - 44 U/L Final   Alkaline Phosphatase 03/10/2023 95  38 - 126 U/L Final   Total Bilirubin 03/10/2023 1.6 (H)  0.3 - 1.2 mg/dL Final   GFR, Estimated 03/10/2023 >60  >60 mL/min Final   Comment: (NOTE) Calculated using the CKD-EPI Creatinine Equation (2021)    Anion gap 03/10/2023 15  5 - 15 Final   Performed at [facility], [address] [place], [place] [address]   Color, Urine 03/10/2023 AMBER (A)  YELLOW Final   BIOCHEMICALS MAY BE AFFECTED BY COLOR   APPearance 03/10/2023 CLEAR (A)  CLEAR Final   Specific Gravity, Urine 03/10/2023 1.012  1.005 - 1.030 Final   pH 03/10/2023 6.0  5.0 - 8.0 Final   Glucose, UA 03/10/2023 50 (A)  NEGATIVE mg/dL Final   Hgb urine dipstick 03/10/2023 SMALL (A)  NEGATIVE Final   Bilirubin Urine 03/10/2023 NEGATIVE  NEGATIVE Final   Ketones,  ur 03/10/2023 NEGATIVE  NEGATIVE mg/dL Final   Protein, ur 09/32/3557 30 (A)  NEGATIVE mg/dL Final   Nitrite 32/20/2542 NEGATIVE  NEGATIVE Final   Leukocytes,Ua 03/10/2023 NEGATIVE  NEGATIVE Final   RBC / HPF 03/10/2023 0-5  0 - 5 RBC/hpf Final   WBC, UA 03/10/2023 0-5  0 - 5 WBC/hpf Final   Bacteria, UA 03/10/2023 NONE SEEN  NONE SEEN Final   Squamous Epithelial / HPF 03/10/2023 0-5  0 - 5 /HPF Final   Mucus 03/10/2023 PRESENT   Final   Performed at [facility], [address] [place], [place] [address]   Troponin I (High Sensitivity) 03/10/2023 47 (H)  <18 ng/L Final   Comment: (NOTE) Elevated high sensitivity troponin I (hsTnI) values and significant  changes across serial measurements may suggest ACS but many other  chronic and acute conditions are known to elevate hsTnI results.  Refer to the "Links" section for chest pain algorithms and additional  guidance. Performed at [facility], [address] [place], [place] [address]    Alcohol, Ethyl (B) 03/10/2023 <10  <10 mg/dL Final   Comment: (NOTE) Lowest detectable limit for serum alcohol is 10 mg/dL.  For medical purposes only. Performed at [facility], [address] [place], [place] [address]    Prothrombin Time 03/10/2023 12.7  11.4 - 15.2 seconds Final   INR 03/10/2023 0.9  0.8 - 1.2 Final   Comment: (NOTE) INR goal varies based on device and disease states. Performed at [facility] [address] [place], [place] [address]    WBC 03/10/2023 3.8 (L)  4.0 - 10.5 K/uL Final   RBC 03/10/2023 RESULTS UNAVAILABLE DUE TO INTERFERING SUBSTANCE  4.22 - 5.81 MIL/uL Final   Hemoglobin 03/10/2023 15.1  13.0 - 17.0 g/dL Final   HCT 71/01/2693 RESULTS UNAVAILABLE DUE TO INTERFERING SUBSTANCE  39.0 - 52.0 % Final   MCV 03/10/2023 RESULTS UNAVAILABLE DUE TO INTERFERING SUBSTANCE  80.0 - 100.0 fL Final   MCH 03/10/2023 RESULTS UNAVAILABLE DUE TO INTERFERING SUBSTANCE  26.0 - 34.0 pg  Final   MCHC 03/10/2023 RESULTS UNAVAILABLE DUE TO INTERFERING SUBSTANCE  30.0 - 36.0 g/dL Final   RDW 85/46/2703 RESULTS UNAVAILABLE DUE TO INTERFERING SUBSTANCE  11.5 - 15.5 % Final   Platelets  03/10/2023 108 (L)  150 - 400 K/uL Final   nRBC 03/10/2023 RESULTS UNAVAILABLE DUE TO INTERFERING SUBSTANCE  0.0 - 0.2 % Final   Performed at [facility], [address] [place], [place] [address]   Troponin I (High Sensitivity) 03/10/2023 41 (H)  <18 ng/L Final   Comment: (NOTE) Elevated high sensitivity troponin I (hsTnI) values and significant  changes across serial measurements may suggest ACS but many other  chronic and acute conditions are known to elevate hsTnI results.  Refer to the "Links" section for chest pain algorithms and additional  guidance. Performed at [facility], [address] [place], [place] [address]    Osmolality, Ur 03/10/2023 105 (L)  300 - 900 mOsm/kg Final   Comment: REPEATED TO VERIFY Performed at [facility], [address] [place], [place] [address]    Osmolality 03/10/2023 266 (L)  275 - 295 mOsm/kg Final   Comment: REPEATED TO VERIFY Performed at [facility], [address] [place], [place] [address]    Sodium, Ur 03/10/2023 23  mmol/L Final   Performed at [facility], [address] [place], [place] [address]   Magnesium 03/10/2023 1.3 (L)  1.7 - 2.4 mg/dL Final   Performed at [facility], [address] [place], [place] [address]   Phosphorus 03/10/2023 2.9  2.5 - 4.6 mg/dL Final   Performed at [facility], [address] [place], [place] [address]   Tricyclic, Ur Screen 03/10/2023 NONE DETECTED  NONE DETECTED Final   Amphetamines, Ur Screen 03/10/2023 NONE DETECTED  NONE DETECTED Final   MDMA (Ecstasy)Ur Screen 03/10/2023 NONE DETECTED  NONE DETECTED Final   Cocaine Metabolite,Ur [place] 03/10/2023 NONE DETECTED  NONE DETECTED Final   Opiate, Ur Screen 03/10/2023 NONE DETECTED   NONE DETECTED Final   Phencyclidine (PCP) Ur S 03/10/2023 NONE DETECTED  NONE DETECTED Final   Cannabinoid 50 Ng, Ur [place] 03/10/2023 NONE DETECTED  NONE DETECTED Final   Barbiturates, Ur Screen 03/10/2023 NONE DETECTED  NONE DETECTED Final   Benzodiazepine, Ur Scrn 03/10/2023 NONE DETECTED  NONE DETECTED Final   Methadone Scn, Ur 03/10/2023 NONE DETECTED  NONE DETECTED Final   Comment: (NOTE) Tricyclics + metabolites, urine    Cutoff 1000 ng/mL Amphetamines + metabolites, urine  Cutoff 1000 ng/mL MDMA (Ecstasy), urine              Cutoff 500 ng/mL Cocaine Metabolite, urine          Cutoff 300 ng/mL Opiate + metabolites, urine        Cutoff 300 ng/mL Phencyclidine (PCP), urine         Cutoff 25 ng/mL Cannabinoid, urine                 Cutoff 50 ng/mL Barbiturates + metabolites, urine  Cutoff 200 ng/mL Benzodiazepine, urine              Cutoff 200 ng/mL Methadone, urine                   Cutoff 300 ng/mL  The urine drug screen provides only a preliminary, unconfirmed analytical test result and should not be used for non-medical purposes. Clinical consideration and professional judgment should be applied to any positive drug screen result due to possible interfering substances. A more specific alternate chemical method must be used in order to obtain a confirmed analytical result. Gas chromatography / mass spectrometry (GC/MS) is the preferred confirm  atory method. Performed at [facility], [address] [place], [place] [address]    B Natriuretic Peptide 03/10/2023 117.7 (H)  0.0 - 100.0 pg/mL Final   Performed at [facility], [address] [place], [place] [address]   WBC MORPHOLOGY 03/10/2023 MORPHOLOGY UNREMARKABLE   Final   RBC MORPHOLOGY 03/10/2023 MORPHOLOGY UNREMARKABLE   Final   Plt Morphology 03/10/2023 PLATELETS APPEAR DECREASED   Final   Clinical Information 03/10/2023 Thrombocytopenia   Final   Performed at [facility], [address] [place], [place] [address]   aPTT 03/10/2023 27  24 - 36 seconds Final   Performed at [facility], [address] [place], [place] [address]   Troponin I (High Sensitivity) 03/10/2023 39 (H)  <18 ng/L Final   Comment: (NOTE) Elevated high sensitivity troponin I (hsTnI) values and significant  changes across serial measurements may suggest ACS but many other  chronic and acute conditions are known to elevate hsTnI results.  Refer to the "Links" section for chest pain algorithms and additional  guidance. Performed at [facility], [address] [place], [place] [address]    Glucose-Capillary 03/10/2023 199 (H)  70 - 99 mg/dL Final   Glucose reference range applies only to samples taken after fasting for at least 8 hours.   WBC 03/11/2023 3.9 (L)  4.0 - 10.5 K/uL Final   RBC 03/11/2023 RESULTS UNAVAILABLE DUE TO INTERFERING SUBSTANCE  4.22 - 5.81 MIL/uL Final   Hemoglobin 03/11/2023 14.0  13.0 - 17.0 g/dL Final   HCT 52/84/1324 RESULTS UNAVAILABLE DUE TO INTERFERING SUBSTANCE  39.0 - 52.0 % Final   MCV 03/11/2023 RESULTS UNAVAILABLE DUE TO INTERFERING SUBSTANCE  80.0 - 100.0 fL Final   MCH 03/11/2023 RESULTS UNAVAILABLE DUE TO INTERFERING SUBSTANCE  26.0 - 34.0 pg Final   MCHC 03/11/2023 RESULTS UNAVAILABLE DUE TO INTERFERING SUBSTANCE  30.0 - 36.0 g/dL Final   RDW 40/05/2724 RESULTS UNAVAILABLE DUE TO INTERFERING SUBSTANCE  11.5 - 15.5 % Final   Platelets 03/11/2023 92 (L)  150 - 400 K/uL Final   Comment: Immature Platelet Fraction may be clinically indicated, consider ordering this additional test [id number]    nRBC 03/11/2023 RESULTS UNAVAILABLE DUE TO INTERFERING SUBSTANCE  0.0 - 0.2 % Final   Performed at [facility], [address] [place], [place] [address]   Magnesium 03/11/2023 1.9  1.7 - 2.4 mg/dL Final   Performed at [facility], [address] [place], [place] [address]   Troponin I (High  Sensitivity) 03/11/2023 41 (H)  <18 ng/L Final   Comment: (NOTE) Elevated high sensitivity troponin I (hsTnI) values and significant  changes across serial measurements may suggest ACS but many other  chronic and acute conditions are known to elevate hsTnI results.  Refer to the "Links" section for chest pain algorithms and additional  guidance. Performed at [facility], [address] [place], [place] [address]    Hgb A1c MFr Bld 03/11/2023 7.2 (H)  4.8 - 5.6 % Final   Comment: (NOTE) Pre diabetes:          5.7%-6.4%  Diabetes:              >6.4%  Glycemic control for   <7.0% adults with diabetes    Mean Plasma Glucose 03/11/2023 159.94  mg/dL Final   Performed at [facility], [address], [place], [place] [address]   Glucose-Capillary 03/10/2023 230 (H)  70 - 99 mg/dL  Final   Glucose reference range applies only to samples taken after fasting for at least 8 hours.   Sodium 03/11/2023 130 (L)  135 - 145 mmol/L Final   Potassium 03/11/2023 3.2 (L)  3.5 - 5.1 mmol/L Final   Chloride 03/11/2023 96 (L)  98 - 111 mmol/L Final   CO2 03/11/2023 26  22 - 32 mmol/L Final   Glucose, Bld 03/11/2023 222 (H)  70 - 99 mg/dL Final   Glucose reference range applies only to samples taken after fasting for at least 8 hours.   BUN 03/11/2023 8  6 - 20 mg/dL Final   Creatinine, Ser 03/11/2023 0.60 (L)  0.61 - 1.24 mg/dL Final   Calcium 16/05/9603 7.9 (L)  8.9 - 10.3 mg/dL Final   GFR, Estimated 03/11/2023 >60  >60 mL/min Final   Comment: (NOTE) Calculated using the CKD-EPI Creatinine Equation (2021)    Anion gap 03/11/2023 8  5 - 15 Final   Performed at [facility], [address], [place], [place] [address]   HIV Screen 4th Generation wRfx 03/11/2023 Non Reactive  Non Reactive Final   Performed at [facility], [address] [place], [place] [address]   Cholesterol 03/11/2023 142  0 - 200 mg/dL Final   Triglycerides 78/29/5621 84  <150 mg/dL Final    HDL 30/86/5784 80  >40 mg/dL Final   Total CHOL/HDL Ratio 03/11/2023 1.8  RATIO Final   VLDL 03/11/2023 17  0 - 40 mg/dL Final   LDL Cholesterol 03/11/2023 45  0 - 99 mg/dL Final   Comment:        Total Cholesterol/HDL:CHD Risk Coronary Heart Disease Risk Table                     Men   Women  1/2 Average Risk   3.4   3.3  Average Risk       5.0   4.4  2 X Average Risk   9.6   7.1  3 X Average Risk  23.4   11.0        Use the calculated Patient Ratio above and the CHD Risk Table to determine the patient's CHD Risk.        ATP III CLASSIFICATION (LDL):  <100     mg/dL   Optimal  696-295  mg/dL   Near or Above                    Optimal  130-159  mg/dL   Borderline  284-132  mg/dL   High  >440     mg/dL   Very High Performed at [facility], [address] [place], [place] [address]    LDH 03/11/2023 245 (H)  98 - 192 U/L Final   Performed at [facility], [address] [place], [place] [address]   Phosphorus 03/11/2023 2.3 (L)  2.5 - 4.6 mg/dL Final   Performed at [facility], [address] [place], [place] [address]   Glucose-Capillary 03/11/2023 189 (H)  70 - 99 mg/dL Final   Glucose reference range applies only to samples taken after fasting for at least 8 hours.   Glucose-Capillary 03/11/2023 123 (H)  70 - 99 mg/dL Final   Glucose reference range applies only to samples taken after fasting for at least 8 hours.  Admission on 03/07/2023, Discharged on 03/07/2023  Component Date Value Ref Range Status   Sodium 03/07/2023 132 (L)  135 -  145 mmol/L Final   Potassium 03/07/2023 4.5  3.5 - 5.1 mmol/L Final   Chloride 03/07/2023 98  98 - 111 mmol/L Final   CO2 03/07/2023 22  22 - 32 mmol/L Final   Glucose, Bld 03/07/2023 173 (H)  70 - 99 mg/dL Final   Glucose reference range applies only to samples taken after fasting for at least 8 hours.   BUN 03/07/2023 10  6 - 20 mg/dL Final   Creatinine, Ser 03/07/2023 0.62  0.61 - 1.24 mg/dL Final    Calcium 86/57/8469 8.0 (L)  8.9 - 10.3 mg/dL Final   Total Protein 62/95/2841 5.5 (L)  6.5 - 8.1 g/dL Final   Albumin 32/44/0102 3.2 (L)  3.5 - 5.0 g/dL Final   AST 72/53/6644 120 (H)  15 - 41 U/L Final   ALT 03/07/2023 53 (H)  0 - 44 U/L Final   Alkaline Phosphatase 03/07/2023 73  38 - 126 U/L Final   Total Bilirubin 03/07/2023 0.8  0.3 - 1.2 mg/dL Final   GFR, Estimated 03/07/2023 >60  >60 mL/min Final   Comment: (NOTE) Calculated using the CKD-EPI Creatinine Equation (2021)    Anion gap 03/07/2023 12  5 - 15 Final   Performed at [facility], [address] [place], [place] [address]   Alcohol, Ethyl (B) 03/07/2023 463 (HH)  <10 mg/dL Final   Comment: CRITICAL RESULT CALLED TO, READ BACK BY AND VERIFIED WITH AMBER PAYNE AT 1733 ON 03/06/13 BY SS (NOTE) Lowest detectable limit for serum alcohol is 10 mg/dL.  For medical purposes only. Performed at [facility], [address] [place], [place] [address]    WBC 03/07/2023 3.9 (L)  4.0 - 10.5 K/uL Final   RBC 03/07/2023 RESULTS UNAVAILABLE DUE TO INTERFERING SUBSTANCE  4.22 - 5.81 MIL/uL Final   Hemoglobin 03/07/2023 15.4  13.0 - 17.0 g/dL Final   HCT 38/75/6433 RESULTS UNAVAILABLE DUE TO INTERFERING SUBSTANCE  39.0 - 52.0 % Final   MCV 03/07/2023 RESULTS UNAVAILABLE DUE TO INTERFERING SUBSTANCE  80.0 - 100.0 fL Final   MCH 03/07/2023 RESULTS UNAVAILABLE DUE TO INTERFERING SUBSTANCE  26.0 - 34.0 pg Final   MCHC 03/07/2023 RESULTS UNAVAILABLE DUE TO INTERFERING SUBSTANCE  30.0 - 36.0 g/dL Final   RDW 29/51/8841 RESULTS UNAVAILABLE DUE TO INTERFERING SUBSTANCE  11.5 - 15.5 % Final   Platelets 03/07/2023 139 (L)  150 - 400 K/uL Final   nRBC 03/07/2023 RESULTS UNAVAILABLE DUE TO INTERFERING SUBSTANCE  0.0 - 0.2 % Final   Performed at [facility], [address] [place], [place] [address]    Blood Alcohol level:  Lab Results  Component Value Date   ETH 125 (H) 04/18/2023   ETH <10 04/16/2023     Metabolic Disorder Labs: Lab Results  Component Value Date   HGBA1C 7.2 (H) 03/11/2023   MPG 159.94 03/11/2023   MPG 145.59 03/20/2022   No results found for: "PROLACTIN" Lab Results  Component Value Date   CHOL 183 04/18/2023   TRIG 91 04/18/2023   HDL 98 04/18/2023   CHOLHDL 1.9 04/18/2023   VLDL 18 04/18/2023   LDLCALC 67 04/18/2023   LDLCALC 45 03/11/2023    Therapeutic Lab Levels: No results found for: "LITHIUM" No results found for: "VALPROATE" No results found for: "CBMZ"  Physical Findings   AUDIT    Flowsheet Row ED from 04/18/2023 in [facility] Admission (Discharged) from 09/12/2018 in [facility] INPATIENT BEHAVIORAL MEDICINE  Alcohol Use Disorder Identification  Test Final Score (AUDIT) 31 0      GAD-7    Flowsheet Row Office Visit from 03/09/2022 in [facility] Office Visit from 10/16/2021 in [facility] Office Visit from 06/05/2021 in [facility] Office Visit from 04/04/2021 in [facility] Office Visit from 02/06/2021 in [facility]  Total GAD-7 Score 3 7 5 4 7       PHQ2-9    Flowsheet Row ED from 04/18/2023 in [facility] Most recent reading at 04/21/2023 10:36 AM ED from 04/18/2023 in [facility] Most recent reading at 04/18/2023  2:18 PM Office Visit from 03/09/2022 in [facility] Most recent reading at 03/09/2022  1:49 PM Clinical Support from 10/25/2021 in [facility] Most recent reading at 10/25/2021  2:08 PM Office Visit from 10/16/2021 in [facility] Most recent reading at 10/16/2021  9:36 AM  PHQ-2 Total Score 2 2 3 1 2   PHQ-9 Total Score 5 16 8 8 5       Flowsheet Row ED from 04/18/2023 in [facility] Most recent reading at  04/18/2023  2:56 PM ED from 04/18/2023 in [facility] Most recent reading at 04/18/2023  1:41 PM ED to Hosp-Admission (Discharged) from 04/15/2023 in [facility] (1A) Most recent reading at 04/16/2023  2:00 AM  C-SSRS RISK CATEGORY No Risk No Risk No Risk       Psychiatric Specialty Exam: Physical Exam Constitutional:      Appearance: the patient is not toxic-appearing.  Pulmonary:     Effort: Pulmonary effort is normal.  Neurological:     General: No focal deficit present.     Mental Status: the patient is alert and oriented to person, place, and time.   Review of Systems  Respiratory:  Negative for shortness of breath.   Cardiovascular:  Negative for chest pain.  Gastrointestinal:  Negative for abdominal pain, constipation, diarrhea, nausea and vomiting.  Neurological:  Negative for headaches.      BP (!) 129/93 (BP Location: Left Arm)   Pulse 99   Temp 98.3 F (36.8 C) (Oral)   Resp 18   SpO2 98%   General Appearance: Fairly Groomed, uncomfortable appearing  Eye Contact:  Good  Speech:  Clear and Coherent  Volume:  Normal  Mood:  "okay"  Affect:  Congruent, limited positive affect  Thought Process:  Coherent  Orientation:  Full (Time, Place, and Person)  Thought Content: Logical   Suicidal Thoughts:  No  Homicidal Thoughts:  No  Memory:  Immediate;   Good  Judgement:  fair  Insight:  fair  Psychomotor Activity:  Normal  Concentration:  Concentration: Good  Recall:  Good  Fund of Knowledge: Good  Language: Good  Akathisia:  No  Handed:  not assessed  AIMS (if indicated): not done  Assets:  Communication Skills Desire for Improvement Housing Leisure Time Physical Health  ADL's:  Intact  Cognition: WNL  Sleep:  poor    Treatment Plan Summary: Daily contact with patient to assess and evaluate symptoms and progress in treatment and Medication management  Alcohol use disorder - Scheduled Ativan 4  times daily today and 3 times daily for tomorrow, weekday team can reassess - Continue as needed Ativan - Patient is already on a significant  dose of gabapentin, so we will not increase this medication - Continue gabapentin 800 mg 4 times daily, home medication - Residential rehab placement, CSW has been notified, [place] treatment center may be a good option  Chronic hyponatremia - Most recent sodium of 130 on 8/24 indicates improvement, etiology is almost certainly poor solute intake due to alcohol use disorder, the patient appears to be asymptomatic.  This has been a problem for the patient for many years. Recheck pended for Monday.   Prolonged QT interval - Initial QTc 486 - Subsequent QTc within normal limits on 8/24  Carlyn Reichert, MD 04/21/2023 10:37 AM

## 2023-04-21 NOTE — Group Note (Signed)
Group Topic: Communication  Group Date: 04/21/2023 Start Time: 0900 End Time: 1000 Facilitators: Fortunato Curling, LPN; Prentice Docker, RN  Department: [facility]  Number of Participants: 6  Group Focus: nursing group Treatment Modality:  Individual Therapy and Psychoeducation Interventions utilized were other Medication Education Purpose: increase insight  Name: Tyler Martinez Date of Birth: 1971-01-08  MR: [id number]    Level of Participation: active Quality of Participation: engaged Interactions with others: compliant Mood/Affect: appropriate and restless Triggers (if applicable): medication seeking tendencies Cognition: manipulative Progress: Minimal Response: "I need the meds" Plan: patient will be encouraged to seek non-pharmalogical methods of symptom relief.  Patients Problems:  Patient Active Problem List   Diagnosis Date Noted   Dizziness 04/16/2023   Cough 04/15/2023   HLD (hyperlipidemia) 03/10/2023   Diarrhea 03/10/2023   Abnormal LFTs 03/10/2023   Thrombocytopenia (HCC) 03/10/2023   Abnormal liver function 03/10/2023   Hypomagnesemia 03/10/2023   Sepsis (HCC) 04/18/2022   Cellulitis of left forearm 04/18/2022   MSSA bacteremia    Anxiety 04/10/2022   Arthritis 04/10/2022   ED (erectile dysfunction) 04/10/2022   Insomnia 04/10/2022   Supraumbilical hernia without gangrene and without obstruction    Left inguinal hernia    Gastritis, erosive    Abdominal bloating    Adenomatous polyp of colon    Colon cancer screening    Hypomagnesuria 07/29/2021   Hyponatremia 06/26/2021   Alcohol withdrawal (HCC) 06/26/2021   Polysubstance abuse (HCC) 06/26/2021   Incidental adrenal cortical adenoma 06/26/2021   Abnormal nuclear stress test    Chest pain 05/18/2021   CAD (coronary artery disease) 05/18/2021   Tobacco use disorder 05/18/2021   History of substance abuse (HCC) 01/12/2020   Alcohol dependence (HCC) 01/12/2020    Coronary artery disease involving native coronary artery of native heart without angina pectoris 04/14/2019   Essential hypertension 04/14/2019   Hyperlipidemia associated with type 2 diabetes mellitus (HCC) 04/14/2019   History of cerebrovascular accident (CVA) with residual deficit 04/14/2019   Residual cognitive deficit as late effect of stroke 04/14/2019   Type 2 diabetes mellitus with vascular disease (HCC) 04/14/2019   Diabetic polyneuropathy associated with type 2 diabetes mellitus (HCC) 04/14/2019   Rheumatoid arthritis involving multiple sites (HCC) 04/14/2019   Flat foot 04/14/2019   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   Concentration deficit 07/29/2018   Non compliance w medication regimen 05/22/2018   Laceration of left wrist 10/04/2017   Major depressive disorder, 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

## 2023-04-21 NOTE — ED Notes (Signed)
Patient awake this time asking for toiletries.

## 2023-04-22 DIAGNOSIS — Z800 Family history of malignant neoplasm of digestive organs: Secondary | ICD-10-CM | POA: Diagnosis not present

## 2023-04-22 DIAGNOSIS — F1090 Alcohol use, unspecified, uncomplicated: Secondary | ICD-10-CM | POA: Diagnosis not present

## 2023-04-22 DIAGNOSIS — F1020 Alcohol dependence, uncomplicated: Secondary | ICD-10-CM | POA: Diagnosis not present

## 2023-04-22 DIAGNOSIS — Z9151 Personal history of suicidal behavior: Secondary | ICD-10-CM | POA: Diagnosis not present

## 2023-04-22 DIAGNOSIS — I10 Essential (primary) hypertension: Secondary | ICD-10-CM | POA: Diagnosis not present

## 2023-04-22 DIAGNOSIS — Z720 Tobacco use: Secondary | ICD-10-CM | POA: Diagnosis not present

## 2023-04-22 LAB — BASIC METABOLIC PANEL
Anion gap: 9 (ref 5–15)
BUN: 16 mg/dL (ref 6–20)
CO2: 24 mmol/L (ref 22–32)
Calcium: 9 mg/dL (ref 8.9–10.3)
Chloride: 100 mmol/L (ref 98–111)
Creatinine, Ser: 0.86 mg/dL (ref 0.61–1.24)
GFR, Estimated: >60 mL/min (ref >60)
Glucose, Bld: 156 mg/dL — ABNORMAL HIGH (ref 70–99)
Potassium: 4.8 mmol/L (ref 3.5–5.1)
Sodium: 133 mmol/L — ABNORMAL LOW (ref 135–145)

## 2023-04-22 MED ORDER — CARVEDILOL 12.5 MG PO TABS
12.5000 mg | ORAL_TABLET | Freq: Two times a day (BID) | ORAL | Status: DC
  Administered 2023-04-22 – 2023-04-23 (×2): 12.5 mg via ORAL
  Filled 2023-04-22 (×2): qty 1

## 2023-04-22 MED ORDER — LORAZEPAM 1 MG PO TABS: 1.0000 mg | ORAL_TABLET | Freq: Two times a day (BID) | ORAL | Status: DC

## 2023-04-22 NOTE — Group Note (Signed)
Group Topic: Recovery Basics  Group Date: 04/22/2023 Start Time: 1015 End Time: 1100 Facilitators: Londell Moh, NT  Department: [facility]  Number of Participants: 6  Group Focus: other AA Meeting Treatment Modality:  Psychoeducation Interventions utilized were patient education Purpose: increase insight  Name: Tyler Martinez Date of Birth: 06-28-1971  MR: [id number]    Level of Participation: active Quality of Participation: engaged Interactions with others: gave feedback Mood/Affect: positive Triggers (if applicable): n/a Cognition: coherent/clear Progress: Gaining insight Response: Pt was active during AA, he shared with the group and offered feedback. Plan: patient will be encouraged to continue to attend groups.  Patients Problems:  Patient Active Problem List   Diagnosis Date Noted   Dizziness 04/16/2023   Cough 04/15/2023   HLD (hyperlipidemia) 03/10/2023   Diarrhea 03/10/2023   Abnormal LFTs 03/10/2023   Thrombocytopenia (HCC) 03/10/2023   Abnormal liver function 03/10/2023   Hypomagnesemia 03/10/2023   Sepsis (HCC) 04/18/2022   Cellulitis of left forearm 04/18/2022   MSSA bacteremia    Anxiety 04/10/2022   Arthritis 04/10/2022   ED (erectile dysfunction) 04/10/2022   Insomnia 04/10/2022   Supraumbilical hernia without gangrene and without obstruction    Left inguinal hernia    Gastritis, erosive    Abdominal bloating    Adenomatous polyp of colon    Colon cancer screening    Hypomagnesuria 07/29/2021   Hyponatremia 06/26/2021   Alcohol withdrawal (HCC) 06/26/2021   Polysubstance abuse (HCC) 06/26/2021   Incidental adrenal cortical adenoma 06/26/2021   Abnormal nuclear stress test    Chest pain 05/18/2021   CAD (coronary artery disease) 05/18/2021   Tobacco use disorder 05/18/2021   History of substance abuse (HCC) 01/12/2020   Alcohol dependence (HCC) 01/12/2020   Coronary artery disease involving native  coronary artery of native heart without angina pectoris 04/14/2019   Essential hypertension 04/14/2019   Hyperlipidemia associated with type 2 diabetes mellitus (HCC) 04/14/2019   History of cerebrovascular accident (CVA) with residual deficit 04/14/2019   Residual cognitive deficit as late effect of stroke 04/14/2019   Type 2 diabetes mellitus with vascular disease (HCC) 04/14/2019   Diabetic polyneuropathy associated with type 2 diabetes mellitus (HCC) 04/14/2019   Rheumatoid arthritis involving multiple sites (HCC) 04/14/2019   Flat foot 04/14/2019   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   Concentration deficit 07/29/2018   Non compliance w medication regimen 05/22/2018   Laceration of left wrist 10/04/2017   Major depressive disorder, 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

## 2023-04-22 NOTE — Progress Notes (Signed)
Pt was visible in the milieu and was appropriate this shift. Pt continues to be tremulous. No signs of acute distress noted. Staff will monitor for pt's  safety.

## 2023-04-22 NOTE — ED Notes (Signed)
Patient in the bedroom sleeping at the moment. NAD. Respirations are even and unlabored. Will continue to monitor for safety.

## 2023-04-22 NOTE — ED Notes (Signed)
Pt was provided breakfast.

## 2023-04-22 NOTE — ED Provider Notes (Cosign Needed Addendum)
Behavioral Health Progress Note  Date and Time: 04/22/2023 9:51 AM Name: Tyler Martinez MRN:  [id number]  Subjective: Patient says he is not feeling to well today due to withdrawal symptoms. He endorses experiencing cravings for the following substances: alcohol . He endorses experiencing withdrawal symptoms at this time, specifically: "the shakes" .  Patient informs me he was not getting his home carvedilol. I informed patient I would reach out to pharmacy staff and restart it if confirmed.  He remains interested in residential rehab placement closer to where he lives.  Patient does not endorse any side-effects they attribute to medications. Patient endorses the following complaints: tremors  Diagnosis:  Final diagnoses:  Alcohol use disorder  Tobacco use disorder   Total Time spent with patient: 45 minutes   Past Psychiatric History: denies  Past Medical History: HTN, diabetes Family History: brother was alcoholic but quit Social History: patient is close to his brother. They both live in [place], [place]. 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, mining Social History:    Sleep: Good   Appetite: Good  Current Medications: Current Facility-Administered Medications  Medication Dose Route Frequency Provider Last Rate Last Admin   acetaminophen (TYLENOL) tablet 650 mg  650 mg Oral Q6H PRN Augusto Gamble, MD   650 mg at 04/22/23 0914   albuterol (VENTOLIN HFA) 108 (90 Base) MCG/ACT inhaler 2 puff  2 puff Inhalation Q6H PRN Augusto Gamble, MD       alum & mag hydroxide-simeth (MAALOX/MYLANTA) 200-200-20 MG/5ML suspension 30 mL  30 mL Oral Q4H PRN Augusto Gamble, MD       amLODipine (NORVASC) tablet 10 mg  10 mg Oral Daily Augusto Gamble, MD   10 mg at 04/22/23 0910   bismuth subsalicylate (PEPTO BISMOL) chewable tablet 524 mg  524 mg Oral Q3H PRN Augusto Gamble, MD   524 mg at 04/20/23 1024   carvedilol (COREG) tablet 12.5 mg  12.5 mg Oral  BID WC Augusto Gamble, MD       haloperidol (HALDOL) tablet 5 mg  5 mg Oral Q6H PRN Augusto Gamble, MD       And   LORazepam (ATIVAN) tablet 1 mg  1 mg Oral Q6H PRN Augusto Gamble, MD       And   diphenhydrAMINE (BENADRYL) capsule 25 mg  25 mg Oral Q6H PRN Augusto Gamble, MD       haloperidol lactate (HALDOL) injection 5 mg  5 mg Intramuscular Q6H PRN Augusto Gamble, MD       And   LORazepam (ATIVAN) injection 1 mg  1 mg Intravenous Q6H PRN Augusto Gamble, MD       And   diphenhydrAMINE (BENADRYL) injection 25 mg  25 mg Intramuscular Q6H PRN Augusto Gamble, MD       gabapentin (NEURONTIN) capsule 800 mg  800 mg Oral QID Augusto Gamble, MD   800 mg at 04/22/23 0910   hydrOXYzine (ATARAX) tablet 25 mg  25 mg Oral TID PRN Augusto Gamble, MD   25 mg at 04/22/23 0653   lisinopril (ZESTRIL) tablet 40 mg  40 mg Oral Daily Augusto Gamble, MD   40 mg at 04/22/23 0910   LORazepam (ATIVAN) tablet 1 mg  1 mg Oral Q4H PRN Augusto Gamble, MD       Or   LORazepam (ATIVAN) injection 1 mg  1 mg Intramuscular Q4H PRN Augusto Gamble, MD  LORazepam (ATIVAN) tablet 1 mg  1 mg Oral TID Carlyn Reichert, MD   1 mg at 04/22/23 0910   [START ON 04/23/2023] LORazepam (ATIVAN) tablet 1 mg  1 mg Oral BID Carlyn Reichert, MD       metFORMIN (GLUCOPHAGE) tablet 1,000 mg  1,000 mg Oral BID WC Augusto Gamble, MD   1,000 mg at 04/22/23 1610   thiamine (VITAMIN B1) tablet 100 mg  100 mg Oral Daily Augusto Gamble, MD   100 mg at 04/22/23 9604   And   multivitamin with minerals tablet 1 tablet  1 tablet Oral Daily Augusto Gamble, MD   1 tablet at 04/22/23 0910   nicotine (NICODERM CQ - dosed in mg/24 hours) patch 14 mg  14 mg Transdermal Daily Lenard Lance, FNP   14 mg at 04/22/23 5409   nicotine polacrilex (NICORETTE) gum 2 mg  2 mg Oral PRN Augusto Gamble, MD       ondansetron [facility]) tablet 8 mg  8 mg Oral Q8H PRN Augusto Gamble, MD       polyethylene glycol (MIRALAX / GLYCOLAX) packet 17 g  17 g Oral Daily PRN Augusto Gamble, MD       senna (SENOKOT) tablet 8.6 mg  1  tablet Oral QHS PRN Augusto Gamble, MD       traZODone (DESYREL) tablet 50 mg  50 mg Oral QHS PRN Sindy Guadeloupe, NP   50 mg at 04/21/23 2115   Current Outpatient Medications  Medication Sig Dispense Refill   carvedilol (COREG) 12.5 MG tablet Take 12.5 mg by mouth 2 (two) times daily with a meal.     albuterol (VENTOLIN HFA) 108 (90 Base) MCG/ACT inhaler Inhale 2 puffs into the lungs every 6 (six) hours as needed for wheezing or shortness of breath. (Patient taking differently: Inhale 2 puffs into the lungs 2 (two) times daily as needed for wheezing or shortness of breath.) 8 g 2   amLODipine (NORVASC) 10 MG tablet TAKE 1 TABLET BY MOUTH EVERY DAY 90 tablet 1   folic acid (FOLVITE) 1 MG tablet Take 1 tablet (1 mg total) by mouth daily. 90 tablet 3   gabapentin (NEURONTIN) 800 MG tablet TAKE 1 TABLET BY MOUTH FOUR TIMES A DAY (Patient taking differently: Take 800 mg by mouth 4 (four) times daily.) 360 tablet 3   lisinopril (ZESTRIL) 40 MG tablet TAKE 1 TABLET BY MOUTH EVERY DAY 90 tablet 1   loperamide (IMODIUM) 2 MG capsule Take 2-4 mg by mouth as needed for diarrhea or loose stools.     metFORMIN (GLUCOPHAGE) 1000 MG tablet Take 1 tablet (1,000 mg total) by mouth 2 (two) times daily with a meal. 180 tablet 0   Multiple Vitamin (MULTIVITAMIN WITH MINERALS) TABS tablet Take 1 tablet by mouth daily.     Naphazoline HCl (CLEAR EYES OP) Place 1-2 drops into both eyes 4 (four) times daily as needed (For eye irritation).     Simethicone (GAS-X PO) Take 3 capsules by mouth daily as needed (flatulence).     sodium chloride 1 g tablet TAKE 1 TABLET (1 G TOTAL) BY MOUTH 2 (TWO) TIMES DAILY WITH A MEAL. 60 tablet 2   thiamine (VITAMIN B1) 100 MG tablet Take 100 mg by mouth daily.     traMADol (ULTRAM) 50 MG tablet TAKE 1 TABLET BY MOUTH EVERY 8 HOURS AS NEEDED (Patient taking differently: Take 50 mg by mouth every 8 (eight) hours as needed for moderate pain.) 60 tablet 2  Labs  Lab Results:     Latest Ref  Rng & Units 04/18/2023    2:55 PM 04/16/2023    3:54 AM 04/15/2023    1:24 PM  CBC  WBC 4.0 - 10.5 K/uL 3.7  4.4  4.1   Hemoglobin 13.0 - 17.0 g/dL 40.9  81.1  91.4   Hematocrit 39.0 - 52.0 % RESULTS UNAVAILABLE DUE TO INTERFERING SUBSTANCE  40.3  37.2   Platelets 150 - 400 K/uL 201  137  136       Latest Ref Rng & Units 04/22/2023    6:10 AM 04/20/2023    1:57 PM 04/18/2023    2:55 PM  CMP  Glucose 70 - 99 mg/dL 782  956  213   BUN 6 - 20 mg/dL 16  8  <5   Creatinine 0.61 - 1.24 mg/dL 0.86  5.78  4.69   Sodium 135 - 145 mmol/L 133  130  127   Potassium 3.5 - 5.1 mmol/L 4.8  3.9  3.6   Chloride 98 - 111 mmol/L 100  96  87   CO2 22 - 32 mmol/L 24  25  22    Calcium 8.9 - 10.3 mg/dL 9.0  8.1  8.7   Total Protein 6.5 - 8.1 g/dL   6.0   Total Bilirubin 0.3 - 1.2 mg/dL   0.7   Alkaline Phos 38 - 126 U/L   90   AST 15 - 41 U/L   138   ALT 0 - 44 U/L   86     Blood Alcohol level:  Lab Results  Component Value Date   ETH 125 (H) 04/18/2023   ETH <10 04/16/2023   Metabolic Disorder Labs: Lab Results  Component Value Date   HGBA1C 7.2 (H) 03/11/2023   MPG 159.94 03/11/2023   MPG 145.59 03/20/2022   No results found for: "PROLACTIN" Lab Results  Component Value Date   CHOL 183 04/18/2023   TRIG 91 04/18/2023   HDL 98 04/18/2023   CHOLHDL 1.9 04/18/2023   VLDL 18 04/18/2023   LDLCALC 67 04/18/2023   LDLCALC 45 03/11/2023   Therapeutic Lab Levels: No results found for: "LITHIUM" No results found for: "VALPROATE" No results found for: "CBMZ" Physical Findings   AUDIT    Flowsheet Row ED from 04/18/2023 in [facility] Admission (Discharged) from 09/12/2018 in [facility] INPATIENT BEHAVIORAL MEDICINE  Alcohol Use Disorder Identification Test Final Score (AUDIT) 31 0      GAD-7    Flowsheet Row Office Visit from 03/09/2022 in [facility] Office Visit from 10/16/2021 in [facility] Office Visit  from 06/05/2021 in [facility] Office Visit from 04/04/2021 in [facility] Office Visit from 02/06/2021 in [facility]  Total GAD-7 Score 3 7 5 4 7       PHQ2-9    Flowsheet Row ED from 04/18/2023 in [facility] Most recent reading at 04/21/2023 10:36 AM ED from 04/18/2023 in [facility] Most recent reading at 04/18/2023  2:18 PM Office Visit from 03/09/2022 in [facility] Most recent reading at 03/09/2022  1:49 PM Clinical Support from 10/25/2021 in [facility] Most recent reading at 10/25/2021  2:08 PM Office Visit from 10/16/2021 in [facility] Most recent reading at 10/16/2021  9:36  AM  PHQ-2 Total Score 2 2 3 1 2   PHQ-9 Total Score 5 16 8 8 5       Flowsheet Row ED from 04/18/2023 in [facility] Most recent reading at 04/18/2023  2:56 PM ED from 04/18/2023 in [facility] Most recent reading at 04/18/2023  1:41 PM ED to Hosp-Admission (Discharged) from 04/15/2023 in [facility] (1A) Most recent reading at 04/16/2023  2:00 AM  C-SSRS RISK CATEGORY No Risk No Risk No Risk       Musculoskeletal  Strength & Muscle Tone: within normal limits Gait & Station: normal Patient leans: N/A  Psychiatric Specialty Exam  Presentation  General Appearance: Appropriate for Environment; Well Groomed   Eye Contact:Good   Speech:Clear and Coherent; Normal Rate   Speech Volume:Normal   Handedness:Right   Mood and Affect  Mood:-- ("not good")   Affect:Appropriate; Congruent; Restricted   Thought Process  Thought Processes:Coherent; Linear; Goal Directed   Descriptions of Associations:Intact   Orientation:Full (Time, Place and Person)   Thought Content:Logical; WDL  Diagnosis of  Schizophrenia or Schizoaffective disorder in past: No    Hallucinations:Hallucinations: None    Ideas of Reference:None   Suicidal Thoughts:Suicidal Thoughts: No    Homicidal Thoughts:Homicidal Thoughts: No    Sensorium  Memory:Immediate Good; Recent Good; Remote Good   Judgment:Good   Insight:Good   Executive Functions  Concentration:Good   Attention Span:Good   Recall:Good   Fund of Knowledge:Good   Language:Good   Psychomotor Activity  Psychomotor Activity:Psychomotor Activity: Normal    Assets  Assets:Resilience   Sleep  Sleep:Sleep: Good    No data recorded   Physical Exam  Physical Exam Vitals and nursing note reviewed.  HENT:     Head: Normocephalic and atraumatic.  Pulmonary:     Effort: Pulmonary effort is normal.  Musculoskeletal:     Cervical back: Normal range of motion.  Neurological:     General: No focal deficit present.     Mental Status: He is alert. Mental status is at baseline.    Review of Systems  Constitutional: Negative.   Respiratory: Negative.    Cardiovascular: Negative.   Gastrointestinal: Negative.   Genitourinary: Negative.   Neurological:  Positive for tremors.  Psychiatric/Behavioral:         Psychiatric subjective data addressed in PSE or HPI / daily subjective report   Blood pressure (!) 142/113, pulse 95, temperature 98.7 F (37.1 C), temperature source Oral, resp. rate 18, SpO2 99%. There is no height or weight on file to calculate BMI.  Treatment Plan Summary: Daily contact with patient to assess and evaluate symptoms and progress in treatment and Medication management:              -- CIWA with scheduled and as needed lorazepam protocol              -- medical regimen: gabapentin for pain, metformin for diabetes, amlodipine, carvedilol, and lisinopril for HTN -- Patient in need of nicotine replacement; nicotine polacrilex (gum) and nicotine patch 14 mg / 24 hours ordered. Smoking cessation  encouraged  PRNs              -- continue acetaminophen 650 mg every 6 hours as needed for mild to moderate pain, fever, and headaches              -- continue hydroxyzine 25 mg three times a day as needed for anxiety              --  continue bismuth subsalicylate 524 mg oral chewable tablet every 3 hours as needed for diarrhea / loose stools              -- continue senna 8.6 mg oral at bedtime and polyethylene glycol 17 g oral daily as needed for mild to moderate constipation              -- continue ondansetron 8 mg every 8 hours as needed for nausea or vomiting              -- continue aluminum-magnesium hydroxide + simethicone 30 mL every 4 hours as needed for heartburn or indigestion              -- continue melatonin 3 mg at bedtime as needed for insomnia  -- As needed agitation protocol in-place  Augusto Gamble, MD 04/22/23 9:51 AM

## 2023-04-22 NOTE — ED Notes (Signed)
Blood Pressure elevated. Checked 2x. Patient refused to be rechecked stating "the more you check the more its higher".  Reassured and made him comfortable in bed. Vistaril 25 mg given. Will report to incoming staff.

## 2023-04-22 NOTE — ED Notes (Signed)
 Pt was provided lunch

## 2023-04-22 NOTE — ED Notes (Signed)
Pt is in the dayroom watching TV with peers. Pt denies SI/HI/AVH. No acute distress noted. Verbalized having little tremors but better than before. Will continue to monitor for safety and provide safety.

## 2023-04-22 NOTE — Progress Notes (Addendum)
Pt is awake, alert and oriented X4. Pt complained of back pain. No signs of acute distress noted.  PRN Acetaminophen and scheduled meds were administered per order. Pt denies current SI/HI/AVH, plan  or intent. Staff will monitor for pt's safety.

## 2023-04-22 NOTE — ED Notes (Signed)
Pt was provided dinner.

## 2023-04-22 NOTE — Group Note (Signed)
Group Topic: Positive Affirmations  Group Date: 04/22/2023 Start Time: 2000 End Time: 2045 Facilitators: Caroline More, LPN  Department: [facility]  Number of Participants: 6  Group Focus: coping skills, Gratitude in Recovery Worksheet. Treatment Modality:  Psychoeducation Interventions utilized were assignment, group exercise, and story telling Purpose: enhance coping skills, express feelings, improve communication skills, increase insight, regain self-worth, and relapse prevention strategies  Name: Tyler Martinez Date of Birth: 1970-09-07  MR: [id number]    Level of Participation: active Quality of Participation: cooperative Interactions with others: gave feedback Mood/Affect: appropriate Triggers (if applicable): N/A Cognition: goal directed and insightful Progress: Moderate Response: Verbalized understanding of exercise and appreciation for realization of his continued gratitude.  Plan: follow-up needed  Patients Problems:  Patient Active Problem List   Diagnosis Date Noted   Dizziness 04/16/2023   Cough 04/15/2023   HLD (hyperlipidemia) 03/10/2023   Diarrhea 03/10/2023   Abnormal LFTs 03/10/2023   Thrombocytopenia (HCC) 03/10/2023   Abnormal liver function 03/10/2023   Hypomagnesemia 03/10/2023   Sepsis (HCC) 04/18/2022   Cellulitis of left forearm 04/18/2022   MSSA bacteremia    Anxiety 04/10/2022   Arthritis 04/10/2022   ED (erectile dysfunction) 04/10/2022   Insomnia 04/10/2022   Supraumbilical hernia without gangrene and without obstruction    Left inguinal hernia    Gastritis, erosive    Abdominal bloating    Adenomatous polyp of colon    Colon cancer screening    Hypomagnesuria 07/29/2021   Hyponatremia 06/26/2021   Alcohol withdrawal (HCC) 06/26/2021   Polysubstance abuse (HCC) 06/26/2021   Incidental adrenal cortical adenoma 06/26/2021   Abnormal nuclear stress test    Chest pain 05/18/2021   CAD (coronary  artery disease) 05/18/2021   Tobacco use disorder 05/18/2021   History of substance abuse (HCC) 01/12/2020   Alcohol dependence (HCC) 01/12/2020   Coronary artery disease involving native coronary artery of native heart without angina pectoris 04/14/2019   Essential hypertension 04/14/2019   Hyperlipidemia associated with type 2 diabetes mellitus (HCC) 04/14/2019   History of cerebrovascular accident (CVA) with residual deficit 04/14/2019   Residual cognitive deficit as late effect of stroke 04/14/2019   Type 2 diabetes mellitus with vascular disease (HCC) 04/14/2019   Diabetic polyneuropathy associated with type 2 diabetes mellitus (HCC) 04/14/2019   Rheumatoid arthritis involving multiple sites (HCC) 04/14/2019   Flat foot 04/14/2019   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   Concentration deficit 07/29/2018   Non compliance w medication regimen 05/22/2018   Laceration of left wrist 10/04/2017   Major depressive disorder, 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

## 2023-04-22 NOTE — Care Management (Signed)
[facility] Care Management   Based on the patient [employer], he has been referred to [facility], [address], [employer]

## 2023-04-22 NOTE — Care Management (Addendum)
[facility] Care Management   Patient has been accepted to [facility] on Tuesday 04-23-2023.  The accepting is Peggye Fothergill, NP.  The address is [address] [place] in [place] [address].  The number to give report ia [phone].  The patient can leave at 9am   6:26pm  Writer met with the patient and he is in agreement to go to the facility.  Patient coordinated with the nursing staff for the patient to go to the free clothes pantry.  Writer assisted the patient in calling his brother so that his brother can bring him some clothes to the facility.  Writer provided the address to his brother.   Writer spoke to the nursing staff and they will coordinate Safe transport transportation for the patient.

## 2023-04-22 NOTE — Progress Notes (Signed)
Pt's CIWA was 6. °

## 2023-04-22 NOTE — ED Notes (Signed)
Patient is sleeping. Respirations equal and unlabored, skin warm and dry. No change in assessment or acuity. Routine safety checks conducted according to facility protocol. Will continue to monitor for safety.   

## 2023-04-23 DIAGNOSIS — F1090 Alcohol use, unspecified, uncomplicated: Secondary | ICD-10-CM | POA: Diagnosis not present

## 2023-04-23 DIAGNOSIS — Z9151 Personal history of suicidal behavior: Secondary | ICD-10-CM | POA: Diagnosis not present

## 2023-04-23 DIAGNOSIS — F1020 Alcohol dependence, uncomplicated: Secondary | ICD-10-CM | POA: Diagnosis not present

## 2023-04-23 DIAGNOSIS — Z720 Tobacco use: Secondary | ICD-10-CM | POA: Diagnosis not present

## 2023-04-23 DIAGNOSIS — I10 Essential (primary) hypertension: Secondary | ICD-10-CM | POA: Diagnosis not present

## 2023-04-23 DIAGNOSIS — Z800 Family history of malignant neoplasm of digestive organs: Secondary | ICD-10-CM | POA: Diagnosis not present

## 2023-04-23 MED ORDER — NICOTINE 14 MG/24HR TD PT24: 14.0000 mg | MEDICATED_PATCH | Freq: Every day | TRANSDERMAL | 0 refills | Status: AC

## 2023-04-23 MED ORDER — NICOTINE POLACRILEX 2 MG MT GUM: 2.0000 mg | CHEWING_GUM | OROMUCOSAL | 0 refills | Status: AC | PRN

## 2023-04-23 MED ORDER — LORAZEPAM 1 MG PO TABS
1.0000 mg | ORAL_TABLET | Freq: Two times a day (BID) | ORAL | Status: DC
  Administered 2023-04-23: 1 mg via ORAL
  Filled 2023-04-23: qty 1

## 2023-04-23 MED ORDER — GABAPENTIN 400 MG PO CAPS: 800.0000 mg | ORAL_CAPSULE | Freq: Four times a day (QID) | ORAL | 0 refills | Status: DC

## 2023-04-23 NOTE — Group Note (Signed)
Group Topic: Fears and Unhealthy Coping Skills  Group Date: 04/23/2023 Start Time: 1046 End Time: 1100 Facilitators: Priscille Kluver, NT  Department: [facility]  Number of Participants: 1  Group Focus: coping skills Treatment Modality:  Individual Therapy Interventions utilized were support Purpose: enhance coping skills  Name: Tyler Martinez Date of Birth: 06-02-1971  MR: [id number]    Level of Participation: Pt did not attend group  Patients Problems:  Patient Active Problem List   Diagnosis Date Noted   Dizziness 04/16/2023   Cough 04/15/2023   HLD (hyperlipidemia) 03/10/2023   Diarrhea 03/10/2023   Abnormal LFTs 03/10/2023   Thrombocytopenia (HCC) 03/10/2023   Abnormal liver function 03/10/2023   Hypomagnesemia 03/10/2023   Sepsis (HCC) 04/18/2022   Cellulitis of left forearm 04/18/2022   MSSA bacteremia    Anxiety 04/10/2022   Arthritis 04/10/2022   ED (erectile dysfunction) 04/10/2022   Insomnia 04/10/2022   Supraumbilical hernia without gangrene and without obstruction    Left inguinal hernia    Gastritis, erosive    Abdominal bloating    Adenomatous polyp of colon    Colon cancer screening    Hypomagnesuria 07/29/2021   Hyponatremia 06/26/2021   Alcohol withdrawal (HCC) 06/26/2021   Polysubstance abuse (HCC) 06/26/2021   Incidental adrenal cortical adenoma 06/26/2021   Abnormal nuclear stress test    Chest pain 05/18/2021   CAD (coronary artery disease) 05/18/2021   Tobacco use disorder 05/18/2021   History of substance abuse (HCC) 01/12/2020   Alcohol dependence (HCC) 01/12/2020   Coronary artery disease involving native coronary artery of native heart without angina pectoris 04/14/2019   Essential hypertension 04/14/2019   Hyperlipidemia associated with type 2 diabetes mellitus (HCC) 04/14/2019   History of cerebrovascular accident (CVA) with residual deficit 04/14/2019   Residual cognitive deficit as late effect  of stroke 04/14/2019   Type 2 diabetes mellitus with vascular disease (HCC) 04/14/2019   Diabetic polyneuropathy associated with type 2 diabetes mellitus (HCC) 04/14/2019   Rheumatoid arthritis involving multiple sites (HCC) 04/14/2019   Flat foot 04/14/2019   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   Concentration deficit 07/29/2018   Non compliance w medication regimen 05/22/2018   Laceration of left wrist 10/04/2017   Major depressive disorder, 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

## 2023-04-23 NOTE — Discharge Instructions (Addendum)
Dear Tyler Martinez,  It was a pleasure to take care of you during your stay at [facility] where you were treated for your alcohol use disorder  While you were here, you were:  observed and cared for by our nurses and nursing assistants  treated with medications by your psychiatrists  evaluated with imaging / lab tests, and treated with medicines / procedures by your doctors  provided individual and group therapy by therapists  provided resources by our social workers and case managers  Please review the medication list provided to you at discharge and stop, start taking, or continue taking the medications listed there.  You should also follow-up with your primary care doctor, or start seeing one if you don't have one yet. If applicable, here are some scheduled follow-ups for you:    I recommend abstinence from alcohol, tobacco, and other illicit drug use.   If your psychiatric symptoms or suicidal thoughts recur, worsen, or if you have side effects to your psychiatric medications, call your outpatient psychiatric provider, 911, [phone] or go to the nearest emergency department.  Take care!  Signed: Augusto Gamble, MD 04/23/2023, 7:17 AM  For a list of more resources, see the following:  [facility] [address] [place], [place], [address] [phone] phone  New Patient Assessment/Therapy Walk-Ins:  Monday and Wednesday: 8 am until slots are full. Every 1st and 2nd Fridays of the month: 1 pm - 5 pm.  NO ASSESSMENT/THERAPY WALK-INS ON TUESDAYS OR THURSDAYS  New Patient Assessment/Medication Management Walk-Ins:  Monday - Friday:  8 am - 11 am.  For all walk-ins, we ask that you arrive by 7:30 am because patients will be seen in the order of arrival.  Availability is limited; therefore, you may not be seen on the same day that you walk-in.  Our goal is to serve and meet the needs of our community to the best of our ability.  12  STEP PROGRAMS:  Alcoholics Anonymous of [place] SoftwareChalet.be  Narcotics Anonymous of [place] HitProtect.dk  Al-Anon of [place], [place] www.[place]alanon.org/find-meetings.html  Nar-Anon https://nar-anon.org/find-a-meeting  Naloxone (Narcan) can help reverse an overdose when given to the victim quickly.  [place] offers free naloxone kits and instructions/training on its use.  Add naloxone to your first aid kit and you can help save a life. A prescription can be filled at your local pharmacy or free kits are provided by the county.  Pick up your free kit at the following locations:   :  [facility], [address] [place] [address] [phone]) [facility] [address] [place] [id number] [phone]) [facility] [address] [place] [address]  [place]: [facility] [address] [place] [address] ([phone]) [facility] [address] [place] [address] [phone])

## 2023-04-23 NOTE — Group Note (Signed)
Group Topic: Communication  Group Date: 04/23/2023 Start Time: 0800 End Time: 0900 Facilitators: Prentice Docker, RN  Department: [facility]  Number of Participants: 3  Group Focus: discharge education Treatment Modality:  Individual Therapy Interventions utilized were patient education Purpose: discharge education  Name: Tyler Martinez Date of Birth: [birth year]  MR: [id number]    Level of Participation: active Quality of Participation: cooperative Interactions with others: gave feedback Mood/Affect: appropriate Triggers (if applicable): n/a Cognition: coherent/clear Progress: Significant Response: verbalized understanding of discharge instructions explained by staff Plan: referral / recommendations  Patients Problems:  Patient Active Problem List   Diagnosis Date Noted   Dizziness 04/16/2023   Cough 04/15/2023   HLD (hyperlipidemia) 03/10/2023   Diarrhea 03/10/2023   Abnormal LFTs 03/10/2023   Thrombocytopenia (HCC) 03/10/2023   Abnormal liver function 03/10/2023   Hypomagnesemia 03/10/2023   Sepsis (HCC) 04/18/2022   Cellulitis of left forearm 04/18/2022   MSSA bacteremia    Anxiety 04/10/2022   Arthritis 04/10/2022   ED (erectile dysfunction) 04/10/2022   Insomnia 04/10/2022   Supraumbilical hernia without gangrene and without obstruction    Left inguinal hernia    Gastritis, erosive    Abdominal bloating    Adenomatous polyp of colon    Colon cancer screening    Hypomagnesuria 07/29/2021   Hyponatremia 06/26/2021   Alcohol withdrawal (HCC) 06/26/2021   Polysubstance abuse (HCC) 06/26/2021   Incidental adrenal cortical adenoma 06/26/2021   Abnormal nuclear stress test    Chest pain 05/18/2021   CAD (coronary artery disease) 05/18/2021   Tobacco use disorder 05/18/2021   History of substance abuse (HCC) 01/12/2020   Alcohol dependence (HCC) 01/12/2020   Coronary artery disease involving native coronary artery of native  heart without angina pectoris 04/14/2019   Essential hypertension 04/14/2019   Hyperlipidemia associated with type 2 diabetes mellitus (HCC) 04/14/2019   History of cerebrovascular accident (CVA) with residual deficit 04/14/2019   Residual cognitive deficit as late effect of stroke 04/14/2019   Type 2 diabetes mellitus with vascular disease (HCC) 04/14/2019   Diabetic polyneuropathy associated with type 2 diabetes mellitus (HCC) 04/14/2019   Rheumatoid arthritis involving multiple sites (HCC) 04/14/2019   Flat foot 04/14/2019   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   Concentration deficit 07/29/2018   Non compliance w medication regimen 05/22/2018   Laceration of left wrist 10/04/2017   Major depressive disorder, 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

## 2023-04-23 NOTE — ED Notes (Signed)
Patient A&O x 4, ambulatory. Patient discharged in no acute distress. Patient denied SI/HI, A/VH upon discharge. Patient verbalized understanding of all discharge instructions explained by staff. Pt belongings returned to patient from locker #5  intact. Patient escorted to [place] via staff for transport to [facility] via [employer]. Safety maintained.

## 2023-04-23 NOTE — ED Notes (Signed)
Patient is sleeping. Respirations equal and unlabored, skin warm and dry. No change in assessment or acuity. Routine safety checks conducted according to facility protocol. Will continue to monitor for safety.   

## 2023-04-23 NOTE — ED Provider Notes (Signed)
FBC/OBS ASAP Discharge Summary  Date: 04/23/23 Name: Tyler Martinez MRN: [id number]  Discharge Diagnoses:  Final diagnoses:  Alcohol use disorder  Tobacco use disorder   Tyler Martinez is a 52 y.o., male with no past psychiatric history and substance use history of alcohol use disorder, severe, stimulant use disorder, and tobacco use disorder  who presents to the [facility] from [facility] Crisis center for evaluation and management of alcohol detox and management of withdrawal symptoms.  Subjective: Patient is eager to go to [facility]. Patient denies having suicidal or homicidal thoughts. He is not hearing any voices others don't hear or seeing things others don't see. He denies experiencing any withdrawal symptoms.  Stay Summary: During the patient's hospitalization, patient had extensive initial psychiatric evaluation, and follow-up psychiatric evaluations every day.  The following meds were provided and managed during his stay. Scheduled Meds:  amLODipine  10 mg Oral Daily   carvedilol  12.5 mg Oral BID WC   gabapentin  800 mg Oral QID   lisinopril  40 mg Oral Daily   LORazepam  1 mg Oral BID   metFORMIN  1,000 mg Oral BID WC   thiamine  100 mg Oral Daily   And   multivitamin with minerals  1 tablet Oral Daily   nicotine  14 mg Transdermal Daily   Continuous Infusions: PRN Meds:.acetaminophen, albuterol, alum & mag hydroxide-simeth, bismuth subsalicylate, haloperidol **AND** LORazepam **AND** diphenhydrAMINE, haloperidol lactate **AND** LORazepam **AND** diphenhydrAMINE, hydrOXYzine, LORazepam **OR** LORazepam, nicotine polacrilex, ondansetron, polyethylene glycol, senna, traZODone  Patient's care was discussed during the interdisciplinary team meeting every day during the hospitalization.  The patient denies any side effects to prescribed psychiatric medication.  Gradually, patient started adjusting to milieu. The patient was evaluated each  day by a clinical provider to ascertain response to treatment. Improvement was noted by the patient's report of decreasing symptoms, improved sleep and appetite, affect, medication tolerance, behavior, and participation in unit programming.  Patient was asked each day to complete a self inventory noting mood, mental status, pain, new symptoms, anxiety and concerns.   Symptoms were reported as significantly decreased or resolved completely by discharge.  The patient reports that their mood is stable.  The patient denied having suicidal thoughts for more than 48 hours prior to discharge.  Patient denies having homicidal thoughts.  Patient denies having auditory hallucinations.  Patient denies any visual hallucinations or other symptoms of psychosis.  The patient was motivated to continue taking medication with a goal of continued improvement in mental health.   Symptoms were reported as significantly decreased or resolved completely by discharge.   On day of discharge, the patient reports that their mood is stable. The patient denied having suicidal thoughts for more than 48 hours prior to discharge.  Patient denies having homicidal thoughts.  Patient denies having auditory hallucinations.  Patient denies any visual hallucinations or other symptoms of psychosis. The patient was motivated to continue taking medication with a goal of continued improvement in mental health.   The patient reports their target psychiatric symptoms of alcohol withdrawals responded well to the psychiatric medications, and the patient reports overall benefit other psychiatric hospitalization. Supportive psychotherapy was provided to the patient. The patient also participated in regular group therapy while hospitalized. Coping skills, problem solving as well as relaxation therapies were also part of the unit programming.  Labs were reviewed with the patient, and abnormal results were discussed with the patient.  The patient is  able to  verbalize their individual safety plan to this provider.  # It is recommended to the patient to continue psychiatric medications as prescribed, after discharge from the hospital.    # It is recommended to the patient to follow up with your outpatient psychiatric provider and PCP.  # It was discussed with the patient, the impact of alcohol, drugs, tobacco have been there overall psychiatric and medical wellbeing, and total abstinence from substance use was recommended the patient.ed.  # Prescriptions provided or sent directly to preferred pharmacy at discharge. Patient agreeable to plan. Given opportunity to ask questions. Appears to feel comfortable with discharge.    # In the event of worsening symptoms, the patient is instructed to call the crisis hotline, 911 and or go to the nearest ED for appropriate evaluation and treatment of symptoms. To follow-up with primary care provider for other medical issues, concerns and or health care needs  # Patient was discharged [facility] with plans to follow up provided in the discharge instructions.  On day of discharge patient is not suicidal or homicidal. He is not exhibiting bizarre behavior nor endorsing auditory or visual hallucinations. He is not exhibiting any life-threatening substance withdrawal symptoms. At present, there are no indications to hold patient involuntarily.  Total Time spent with patient: 45 minutes  Past Psychiatric History: denies  Past Medical History: HTN, diabetes Family History: brother was alcoholic but quit Social History: patient is close to his brother. They both live in [place], [place]. 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 Tobacco Cessation: A prescription for an FDA-approved tobacco cessation medication provided at discharge  Current Medications:  Current Facility-Administered Medications  Medication Dose Route Frequency Provider Last Rate Last Admin    acetaminophen (TYLENOL) tablet 650 mg  650 mg Oral Q6H PRN Augusto Gamble, MD   650 mg at 04/22/23 0914   albuterol (VENTOLIN HFA) 108 (90 Base) MCG/ACT inhaler 2 puff  2 puff Inhalation Q6H PRN Augusto Gamble, MD       alum & mag hydroxide-simeth (MAALOX/MYLANTA) 200-200-20 MG/5ML suspension 30 mL  30 mL Oral Q4H PRN Augusto Gamble, MD       amLODipine (NORVASC) tablet 10 mg  10 mg Oral Daily Augusto Gamble, MD   10 mg at 04/23/23 7829   bismuth subsalicylate (PEPTO BISMOL) chewable tablet 524 mg  524 mg Oral Q3H PRN Augusto Gamble, MD   524 mg at 04/20/23 1024   carvedilol (COREG) tablet 12.5 mg  12.5 mg Oral BID WC Augusto Gamble, MD   12.5 mg at 04/23/23 5621   haloperidol (HALDOL) tablet 5 mg  5 mg Oral Q6H PRN Augusto Gamble, MD       And   LORazepam (ATIVAN) tablet 1 mg  1 mg Oral Q6H PRN Augusto Gamble, MD       And   diphenhydrAMINE (BENADRYL) capsule 25 mg  25 mg Oral Q6H PRN Augusto Gamble, MD       haloperidol lactate (HALDOL) injection 5 mg  5 mg Intramuscular Q6H PRN Augusto Gamble, MD       And   LORazepam (ATIVAN) injection 1 mg  1 mg Intravenous Q6H PRN Augusto Gamble, MD       And   diphenhydrAMINE (BENADRYL) injection 25 mg  25 mg Intramuscular Q6H PRN Augusto Gamble, MD       gabapentin (NEURONTIN) capsule 800 mg  800 mg Oral QID Augusto Gamble, MD   800 mg  at 04/23/23 2956   hydrOXYzine (ATARAX) tablet 25 mg  25 mg Oral TID PRN Augusto Gamble, MD   25 mg at 04/22/23 0653   lisinopril (ZESTRIL) tablet 40 mg  40 mg Oral Daily Augusto Gamble, MD   40 mg at 04/23/23 2130   LORazepam (ATIVAN) tablet 1 mg  1 mg Oral Q4H PRN Augusto Gamble, MD       Or   LORazepam (ATIVAN) injection 1 mg  1 mg Intramuscular Q4H PRN Augusto Gamble, MD       LORazepam (ATIVAN) tablet 1 mg  1 mg Oral BID Augusto Gamble, MD   1 mg at 04/23/23 8657   metFORMIN (GLUCOPHAGE) tablet 1,000 mg  1,000 mg Oral BID WC Augusto Gamble, MD   1,000 mg at 04/23/23 8469   thiamine (VITAMIN B1) tablet 100 mg  100 mg Oral Daily Augusto Gamble, MD   100 mg at 04/23/23 6295    And   multivitamin with minerals tablet 1 tablet  1 tablet Oral Daily Augusto Gamble, MD   1 tablet at 04/23/23 2841   nicotine (NICODERM CQ - dosed in mg/24 hours) patch 14 mg  14 mg Transdermal Daily Lenard Lance, FNP   14 mg at 04/23/23 3244   nicotine polacrilex (NICORETTE) gum 2 mg  2 mg Oral PRN Augusto Gamble, MD       ondansetron [facility]) tablet 8 mg  8 mg Oral Q8H PRN Augusto Gamble, MD       polyethylene glycol (MIRALAX / GLYCOLAX) packet 17 g  17 g Oral Daily PRN Augusto Gamble, MD       senna (SENOKOT) tablet 8.6 mg  1 tablet Oral QHS PRN Augusto Gamble, MD       traZODone (DESYREL) tablet 50 mg  50 mg Oral QHS PRN Sindy Guadeloupe, NP   50 mg at 04/22/23 2148   Current Outpatient Medications  Medication Sig Dispense Refill   carvedilol (COREG) 12.5 MG tablet Take 12.5 mg by mouth 2 (two) times daily with a meal.     albuterol (VENTOLIN HFA) 108 (90 Base) MCG/ACT inhaler Inhale 2 puffs into the lungs every 6 (six) hours as needed for wheezing or shortness of breath. (Patient taking differently: Inhale 2 puffs into the lungs 2 (two) times daily as needed for wheezing or shortness of breath.) 8 g 2   amLODipine (NORVASC) 10 MG tablet TAKE 1 TABLET BY MOUTH EVERY DAY 90 tablet 1   folic acid (FOLVITE) 1 MG tablet Take 1 tablet (1 mg total) by mouth daily. 90 tablet 3   gabapentin (NEURONTIN) 400 MG capsule Take 2 capsules (800 mg total) by mouth 4 (four) times daily. 240 capsule 0   lisinopril (ZESTRIL) 40 MG tablet TAKE 1 TABLET BY MOUTH EVERY DAY 90 tablet 1   loperamide (IMODIUM) 2 MG capsule Take 2-4 mg by mouth as needed for diarrhea or loose stools.     metFORMIN (GLUCOPHAGE) 1000 MG tablet Take 1 tablet (1,000 mg total) by mouth 2 (two) times daily with a meal. 180 tablet 0   Multiple Vitamin (MULTIVITAMIN WITH MINERALS) TABS tablet Take 1 tablet by mouth daily.     Naphazoline HCl (CLEAR EYES OP) Place 1-2 drops into both eyes 4 (four) times daily as needed (For eye irritation).     nicotine  (NICODERM CQ - DOSED IN MG/24 HOURS) 14 mg/24hr patch Place 1 patch (14 mg total) onto the skin daily. 30 patch 0   nicotine polacrilex (NICORETTE) 2  MG gum Take 1 each (2 mg total) by mouth as needed for smoking cessation. 120 tablet 0   Simethicone (GAS-X PO) Take 3 capsules by mouth daily as needed (flatulence).     sodium chloride 1 g tablet TAKE 1 TABLET (1 G TOTAL) BY MOUTH 2 (TWO) TIMES DAILY WITH A MEAL. 60 tablet 2   thiamine (VITAMIN B1) 100 MG tablet Take 100 mg by mouth daily.     traMADol (ULTRAM) 50 MG tablet TAKE 1 TABLET BY MOUTH EVERY 8 HOURS AS NEEDED (Patient taking differently: Take 50 mg by mouth every 8 (eight) hours as needed for moderate pain.) 60 tablet 2    PTA Medications: (Not in a hospital admission)      04/21/2023   10:36 AM 04/18/2023    2:39 PM 04/18/2023    2:18 PM  Depression screen PHQ 2/9  Decreased Interest 1 1 1   Down, Depressed, Hopeless 1 1 1   PHQ - 2 Score 2 2 2   Altered sleeping 1 3 3   Tired, decreased energy 0 1 1  Change in appetite 0 2 2  Feeling bad or failure about yourself  1 3 3   Trouble concentrating 1 3 3   Moving slowly or fidgety/restless 0 2 2  Suicidal thoughts 0 0 0  PHQ-9 Score 5 16 16   Difficult doing work/chores Somewhat difficult      Flowsheet Row ED from 04/18/2023 in [facility] Most recent reading at 04/18/2023  2:56 PM ED from 04/18/2023 in [facility] Most recent reading at 04/18/2023  1:41 PM ED to Hosp-Admission (Discharged) from 04/15/2023 in [facility] (1[facility]) Most recent reading at 04/16/2023  2:00 AM  C-SSRS RISK CATEGORY No Risk No Risk No Risk       Musculoskeletal  Strength & Muscle Tone: within normal limits Gait & Station: normal Patient leans: N/A  Psychiatric Specialty Exam  Presentation General Appearance: Appropriate for Environment; Well Groomed   Eye Contact:Good   Speech:Clear and Coherent; Normal  Rate   Speech Volume:Normal   Handedness:Right   Mood and Affect  Mood:-- ("not good")   Affect:Appropriate; Congruent; Restricted   Thought Process  Thought Processes:Coherent; Linear; Goal Directed   Descriptions of Associations:Intact   Orientation:Full (Time, Place and Person)   Thought Content:Logical; WDL   Diagnosis of Schizophrenia or Schizoaffective disorder in past: No    Hallucinations:Hallucinations: None   Ideas of Reference:None   Suicidal Thoughts:Suicidal Thoughts: No   Homicidal Thoughts:Homicidal Thoughts: No   Sensorium  Memory:Immediate Good; Recent Good; Remote Good   Judgment:Good   Insight:Good   Executive Functions  Concentration:Good   Attention Span:Good   Recall:Good   Fund of Knowledge:Good   Language:Good   Psychomotor Activity  Psychomotor Activity:Psychomotor Activity: Normal   Assets  Assets:Resilience   Sleep  Sleep:Sleep: Good   No data recorded  Physical Exam  Physical Exam Vitals and nursing note reviewed.  HENT:     Head: Normocephalic and atraumatic.  Pulmonary:     Effort: Pulmonary effort is normal.  Musculoskeletal:     Cervical back: Normal range of motion.  Neurological:     General: No focal deficit present.     Mental Status: He is alert. Mental status is at baseline.    Review of Systems  Constitutional: Negative.   Respiratory: Negative.    Cardiovascular: Negative.   Gastrointestinal: Negative.   Genitourinary: Negative.   Psychiatric/Behavioral:  Psychiatric subjective data addressed in PSE or HPI / daily subjective report   Blood pressure (!) 124/92, pulse 78, temperature 98.2 F (36.8 C), resp. rate 18, SpO2 99%. There is no height or weight on file to calculate BMI.  Demographic Factors:  Male and Caucasian  Loss Factors: Financial problems/change in socioeconomic status  Historical Factors: NA  Risk Reduction Factors:   NA  Continued  Clinical Symptoms:  Alcohol/Substance Abuse/Dependencies More than one psychiatric diagnosis  Cognitive Features That Contribute To Risk:  None    Suicide Risk:  Mild:  Suicidal ideation of limited frequency, intensity, duration, and specificity.  There are no identifiable plans, no associated intent, mild dysphoria and related symptoms, good self-control (both objective and subjective assessment), few other risk factors, and identifiable protective factors, including available and accessible social support.  Plan Of Care/Follow-up recommendations:  Activity: as tolerated  Diet: heart healthy  Other: -Follow-up with your outpatient psychiatric provider -instructions on appointment date, time, and address (location) are provided to you in discharge paperwork.  -Take your psychiatric medications as prescribed at discharge - instructions are provided to you in the discharge paperwork  -Follow-up with outpatient primary care doctor and other specialists -for management of chronic medical disease, including: health maintenance checks  -Testing: Follow-up with outpatient provider for abnormal lab results: none  -Recommend abstinence from alcohol, tobacco, and other illicit drug use at discharge.   -If your psychiatric symptoms recur, worsen, or if you have side effects to your psychiatric medications, call your outpatient psychiatric provider, 911, [phone] or go to the nearest emergency department.  -If suicidal thoughts recur, call your outpatient psychiatric provider, 911, [phone] or go to the nearest emergency department.  Disposition:  [facility]  Augusto Gamble, MD 04/23/2023, 8:44 AM

## 2023-05-31 ENCOUNTER — Other Ambulatory Visit: Payer: Self-pay | Admitting: Family Medicine

## 2023-05-31 DIAGNOSIS — M25311 Other instability, right shoulder: Secondary | ICD-10-CM

## 2023-05-31 DIAGNOSIS — M25511 Pain in right shoulder: Secondary | ICD-10-CM

## 2023-05-31 NOTE — Telephone Encounter (Signed)
Requested medication (s) are due for refill today: yes  Requested medication (s) are on the active medication list: yes  Last refill:  11/08/22 #60/2  Future visit scheduled: no  Notes to clinic:  Unable to refill per protocol, cannot delegate.      Requested Prescriptions  Pending Prescriptions Disp Refills   traMADol (ULTRAM) 50 MG tablet [Pharmacy Med Name: TRAMADOL HCL 50 MG TABLET] 60 tablet     Sig: TAKE 1 TABLET BY MOUTH EVERY 8 HOURS AS NEEDED     Not Delegated - Analgesics:  Opioid Agonists Failed - 05/31/2023  4:01 PM      Failed - This refill cannot be delegated      Failed - Valid encounter within last 3 months    Recent Outpatient Visits           9 months ago COPD with acute exacerbation [facility])   [place] [facility] [place], Netta Neat, DO   1 year ago Cellulitis of left forearm   [place] [facility] [place], Oswaldo Con[place], [place]   1 year ago Acute pain of left knee   [facility] Smitty Cords, DO   1 year ago Spigelian hernia   [place] [facility] Smitty Cords, DO   1 year ago Abdominal bloating with cramps   [place] [facility] [place], Netta Neat, DO              Passed - Urine Drug Screen completed in last 360 days

## 2023-06-09 NOTE — Progress Notes (Deleted)
NO SHOW

## 2023-06-10 ENCOUNTER — Ambulatory Visit: Payer: Medicare Other | Attending: Cardiovascular Disease | Admitting: Cardiovascular Disease

## 2023-06-10 DIAGNOSIS — I25118 Atherosclerotic heart disease of native coronary artery with other forms of angina pectoris: Secondary | ICD-10-CM

## 2023-06-10 DIAGNOSIS — E782 Mixed hyperlipidemia: Secondary | ICD-10-CM

## 2023-06-10 DIAGNOSIS — E1159 Type 2 diabetes mellitus with other circulatory complications: Secondary | ICD-10-CM

## 2023-06-10 DIAGNOSIS — F1090 Alcohol use, unspecified, uncomplicated: Secondary | ICD-10-CM

## 2023-06-10 DIAGNOSIS — I10 Essential (primary) hypertension: Secondary | ICD-10-CM

## 2023-06-10 DIAGNOSIS — F17200 Nicotine dependence, unspecified, uncomplicated: Secondary | ICD-10-CM

## 2023-06-12 ENCOUNTER — Encounter: Payer: Self-pay | Admitting: Family Medicine

## 2023-06-12 ENCOUNTER — Ambulatory Visit: Payer: Medicare Other | Admitting: Family Medicine

## 2023-06-12 VITALS — BP 118/64 | HR 79 | Ht 72.0 in | Wt 219.0 lb

## 2023-06-12 DIAGNOSIS — H9193 Unspecified hearing loss, bilateral: Secondary | ICD-10-CM

## 2023-06-12 DIAGNOSIS — E1159 Type 2 diabetes mellitus with other circulatory complications: Secondary | ICD-10-CM

## 2023-06-12 DIAGNOSIS — R140 Abdominal distension (gaseous): Secondary | ICD-10-CM | POA: Diagnosis not present

## 2023-06-12 DIAGNOSIS — G8929 Other chronic pain: Secondary | ICD-10-CM | POA: Diagnosis not present

## 2023-06-12 DIAGNOSIS — M25511 Pain in right shoulder: Secondary | ICD-10-CM

## 2023-06-12 DIAGNOSIS — M25311 Other instability, right shoulder: Secondary | ICD-10-CM | POA: Diagnosis not present

## 2023-06-12 DIAGNOSIS — M542 Cervicalgia: Secondary | ICD-10-CM | POA: Diagnosis not present

## 2023-06-12 DIAGNOSIS — M1712 Unilateral primary osteoarthritis, left knee: Secondary | ICD-10-CM

## 2023-06-12 DIAGNOSIS — M5030 Other cervical disc degeneration, unspecified cervical region: Secondary | ICD-10-CM

## 2023-06-12 DIAGNOSIS — F1021 Alcohol dependence, in remission: Secondary | ICD-10-CM

## 2023-06-12 LAB — POCT GLYCOSYLATED HEMOGLOBIN (HGB A1C): Hemoglobin A1C: 6.8 % — AB (ref 4.0–5.6)

## 2023-06-12 MED ORDER — THIAMINE HCL 100 MG PO TABS
100.0000 mg | ORAL_TABLET | Freq: Every day | ORAL | 3 refills | Status: DC
Start: 2023-06-12 — End: 2023-11-07

## 2023-06-12 MED ORDER — FOLIC ACID 1 MG PO TABS
1.0000 mg | ORAL_TABLET | Freq: Every day | ORAL | 3 refills | Status: DC
Start: 2023-06-12 — End: 2023-11-07

## 2023-06-12 MED ORDER — PREDNISONE 20 MG PO TABS: ORAL_TABLET | ORAL | 0 refills | Status: DC

## 2023-06-12 MED ORDER — TRAMADOL HCL 50 MG PO TABS
50.0000 mg | ORAL_TABLET | ORAL | 0 refills | Status: DC | PRN
Start: 2023-06-12 — End: 2023-08-15

## 2023-06-12 NOTE — Progress Notes (Signed)
Subjective:    Patient ID: Tyler Martinez, male    DOB: 1971/04/28, 52 y.o.   MRN: [id number]  Tyler Martinez is a 52 y.o. male presenting on 06/12/2023 for Diabetes and Hearing Problem (Recent bilateral ear infection and hearing still affected)   HPI  Discussed the use of AI scribe software for clinical note transcription with the patient, who gave verbal consent to proceed.     The patient, a 52 year old with a history of alcohol dependence, presented with multiple complaints. The primary concern was bilateral hearing loss following a double ear infection approximately three and a half weeks ago. The patient reported drainage from both ears, including blood, and a significant decrease in hearing, which has not improved. He has already scheduled [facility] 06/17/23 next week. He has Bactrim antibiotic and will continue to take this already from recent infected cyst skin infection  The patient also reported experiencing sciatica-like symptoms in the legs, particularly when lying on his sides at night. The pain was described as a throbbing sensation extending down to the top of the foot. Additionally, the patient reported a similar pain in the shoulder, which was described as a burning sensation extending down through the muscle. The patient denied any recent injury but noted the pain was as severe in the shoulder as in the legs.  The patient also reported gastrointestinal issues, specifically feeling bloated after eating small amounts of food.  The patient also reported a recent issue with his prescription for tramadol. The patient was prescribed 60 tablets per month, but the pharmacy only filled 21 tablets. The patient reported taking the medication as prescribed and expressed a need for the full prescription to manage his pain. The patient also reported a history of arthritis, which seemed to be exacerbated by cold weather.   The patient also reported a recent successful stint in a  treatment center for alcohol dependence. The patient reported having stopped drinking and was interested in a monthly Vivitrol shot to help maintain sobriety. However, the patient decided against this treatment due to potential interactions with his pain medication. He remains alcohol free and not interested in resuming drinking. He had some complicated withdrawal symptoms previously.     CHRONIC DM, Type 2: Reports sugar is doing well A1c 6.8 Meds: Metformin 1000mg  BID Reports  good compliance. Tolerating well w/o side-effects Currently on ACEi Denies hypoglycemia, polyuria, visual changes, numbness or tingling.       06/12/2023    3:29 PM 04/23/2023    8:45 AM 04/21/2023   10:36 AM  Depression screen PHQ 2/9  Decreased Interest 1    Down, Depressed, Hopeless 1    PHQ - 2 Score 2    Altered sleeping 1    Tired, decreased energy 2    Change in appetite     Feeling bad or failure about yourself  0    Trouble concentrating     Moving slowly or fidgety/restless 2    Suicidal thoughts 0    PHQ-9 Score 7    Difficult doing work/chores Very difficult       Information is confidential and restricted. Go to Review Flowsheets to unlock data.    Social History   Tobacco Use   Smoking status: Every Day    Current packs/day: 1.00    Average packs/day: 1 pack/day for 8.0 years (8.0 ttl pk-yrs)    Types: Cigarettes   Smokeless tobacco: Never  Vaping Use   Vaping status: Never Used  Substance Use Topics   Alcohol use: Yes    Alcohol/week: 2.0 standard drinks of alcohol    Types: 2 Standard drinks or equivalent per week    Comment: Drinks vodka every evening   Drug use: Not Currently    Types: Amphetamines    Review of Systems Per HPI unless specifically indicated above     Objective:    BP 118/64   Pulse 79   Ht 6' (1.829 m)   Wt 219 lb (99.3 kg)   SpO2 95%   BMI 29.70 kg/m   Wt Readings from Last 3 Encounters:  06/12/23 219 lb (99.3 kg)  04/15/23 218 lb (98.9 kg)   03/11/23 218 lb (98.9 kg)    Physical Exam Vitals and nursing note reviewed.  Constitutional:      General: He is not in acute distress.    Appearance: Normal appearance. He is well-developed. He is not diaphoretic.     Comments: Well-appearing, comfortable, cooperative  HENT:     Head: Normocephalic and atraumatic.     Right Ear: There is no impacted cerumen.     Left Ear: There is no impacted cerumen.     Ears:     Comments: Bilateral TM abnormal anatomy unable to view TMs, some debris and cerumen, concern for possible TM ruptures Eyes:     General:        Right eye: No discharge.        Left eye: No discharge.     Conjunctiva/sclera: Conjunctivae normal.  Cardiovascular:     Rate and Rhythm: Normal rate.  Pulmonary:     Effort: Pulmonary effort is normal.  Skin:    General: Skin is warm and dry.     Findings: No erythema or rash.  Neurological:     Mental Status: He is alert and oriented to person, place, and time.  Psychiatric:        Mood and Affect: Mood normal.        Behavior: Behavior normal.        Thought Content: Thought content normal.     Comments: Well groomed, good eye contact, normal speech and thoughts    Results for orders placed or performed in visit on 06/12/23  POCT glycosylated hemoglobin (Hb A1C)  Result Value Ref Range   Hemoglobin A1C 6.8 (A) 4.0 - 5.6 %   HbA1c POC (<> result, manual entry)     HbA1c, POC (prediabetic range)     HbA1c, POC (controlled diabetic range)        Assessment & Plan:   Problem List Items Addressed This Visit     Abdominal bloating   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)   Relevant Medications   folic acid (FOLVITE) 1 MG tablet   thiamine (VITAMIN B1) 100 MG tablet   Type 2 diabetes mellitus with vascular disease (HCC) - Primary   Relevant Orders   POCT glycosylated hemoglobin (Hb A1C) (Completed)   Other Visit Diagnoses     Bilateral hearing loss, unspecified hearing loss type       Other  instability, right shoulder       Relevant Medications   traMADol (ULTRAM) 50 MG tablet   Primary osteoarthritis of left knee       Relevant Medications   traMADol (ULTRAM) 50 MG tablet   predniSONE (DELTASONE) 20 MG tablet   Chronic right shoulder pain       Relevant Medications   traMADol (ULTRAM) 50 MG tablet  predniSONE (DELTASONE) 20 MG tablet        Assessment and Plan    Bilateral Hearing Loss / Ear Pain / Suspected Spontaneous TM Rupture from Bilateral Otitis Media History of bilateral ear infection with drainage and blood. Hearing loss reported. Appointment with ENT scheduled for 06/17/2023. -Continue Bactrim as patient has at home.  Chronic Neck pain DDD Low Back / Sciatica and Shoulder Pain Reports of pain radiating down legs and shoulder. No recent injury reported. -Start Prednisone for temporary relief. -Consider cortisone injections if pain persists. ( R Shoulder, Knees) -Referral to neurologist at [facility] for further evaluation.  Gastrointestinal Issues Postprandial bloating, dysfunctional GI Follow up if persistent or new concerns.  Tramadol Prescription Discrepancy in quantity of Tramadol received. - seems patient only able to get 7 day pill count filled /" covered, advised him he can purchase remaining pills out of pocket if not covered. -Adjusted prescription instructions to increase pill count to 42.  Alcohol Dependence Patient reports cessation of alcohol consumption. -Consider Naltrexone in the future if patient desires. - we reviewed Vivitrol injections, he already received at rehab but we discussed the limitation of use of opioid / Tramadol while on this and he declines further injection or oral version.  Folic Acid and Multivitamin Patient reports not receiving these supplements recently. -Resend prescriptions to [facility].  Type 2 Diabetes A1c 6.8 controlled Keep on therapy     Orders Placed This Encounter  Procedures    Ambulatory referral to Neurology    Referral Priority:   Routine    Referral Type:   Consultation    Referral Reason:   Specialty Services Required    Requested Specialty:   Neurology    Number of Visits Requested:   1   POCT glycosylated hemoglobin (Hb A1C)        Meds ordered this encounter  Medications   traMADol (ULTRAM) 50 MG tablet    Sig: Take 1 tablet (50 mg total) by mouth every 4 (four) hours as needed for moderate pain (pain score 4-6). For 7 days    Dispense:  42 tablet    Refill:  0    This request is for a new prescription for a controlled substance as required by Federal/State law. DX Code Needed  .M25.511   predniSONE (DELTASONE) 20 MG tablet    Sig: Take daily with food. Start with 60mg  (3 pills) x 2 days, then reduce to 40mg  (2 pills) x 2 days, then 20mg  (1 pill) x 3 days    Dispense:  13 tablet    Refill:  0   folic acid (FOLVITE) 1 MG tablet    Sig: Take 1 tablet (1 mg total) by mouth daily.    Dispense:  90 tablet    Refill:  3   thiamine (VITAMIN B1) 100 MG tablet    Sig: Take 1 tablet (100 mg total) by mouth daily.    Dispense:  90 tablet    Refill:  3      Follow up plan: Return if symptoms worsen or fail to improve.   Saralyn Pilar, DO [facility] 06/12/2023, 3:42 PM

## 2023-06-12 NOTE — Patient Instructions (Addendum)
Thank you for coming to the office today.  Keep apt with ENT next week. I cannot see the Ear Drums clearly, there is likely some damage or rupture.  Keep on antibiotic that you have Bactrim.  Recommend Folic Acid and Vitamin B1  IN THE FUTURE we can start Naltrexone 50mg  once daily for alcohol dependence. Keep on for longer term up to 1 year.  Adjusted Rx for Tramadol.  Keep on Gabapentin  We can consider sterid injection in shoulder in future.  Please schedule a Follow-up Appointment to: Return if symptoms worsen or fail to improve.  If you have any other questions or concerns, please feel free to call the office or send a message through MyChart. You may also schedule an earlier appointment if necessary.  Additionally, you may be receiving a survey about your experience at our office within a few days to 1 week by e-mail or mail. We value your feedback.  Saralyn Pilar, DO [facility], [place]

## 2023-06-17 ENCOUNTER — Ambulatory Visit: Payer: Medicare Other | Admitting: Family Medicine

## 2023-06-20 ENCOUNTER — Telehealth: Payer: Self-pay

## 2023-06-20 DIAGNOSIS — H6593 Unspecified nonsuppurative otitis media, bilateral: Secondary | ICD-10-CM

## 2023-06-20 DIAGNOSIS — H9193 Unspecified hearing loss, bilateral: Secondary | ICD-10-CM

## 2023-06-20 NOTE — Telephone Encounter (Signed)
Referral submitted to [facility] [place]  Saralyn Pilar, DO [facility] 06/20/2023, 1:43 PM

## 2023-06-20 NOTE — Telephone Encounter (Signed)
Copied from CRM [id number]. Topic: Referral - Request for Referral >> Jun 20, 2023 10:34 AM Phill Myron wrote: Please call Mr. Bouchie regarding a referral to the ENT doctor and his insurance.

## 2023-06-20 NOTE — Addendum Note (Signed)
Addended by: Smitty Cords on: 06/20/2023 01:43 PM   Modules accepted: Orders

## 2023-06-20 NOTE — Telephone Encounter (Signed)
I last saw patient on 06/12/23 for this issue, at that time he told me that he was scheduled already for 06/17/23 with ENT.  I was not precisely sure with which location.  Can you contact patient back to clarify - which location was he scheduled at and was there a reason he could not be seen? Was it due to insurance?  Our only option locally is [facility] - they are in [place] and [place] (they are not part of [facility]) but they are often in network.  If he needs [place] only, the only option is [facility].  Let me know which location he prefers and we can submit the referral.  Saralyn Pilar, DO [facility] 06/20/2023, 1:33 PM

## 2023-06-20 NOTE — Telephone Encounter (Signed)
Patient is requesting a  only ENT for insurance purposes.  I called him back and he is agreeable to go to [place].  He is very appreciative of our assistance.

## 2023-06-20 NOTE — Telephone Encounter (Signed)
Opened this encounter in error.

## 2023-07-03 ENCOUNTER — Telehealth: Payer: Self-pay

## 2023-07-03 DIAGNOSIS — Z1211 Encounter for screening for malignant neoplasm of colon: Secondary | ICD-10-CM

## 2023-07-03 NOTE — Telephone Encounter (Signed)
Copied from CRM [id number]. Topic: Referral - Request for Referral >> Jul 03, 2023 11:04 AM Franchot Heidelberg wrote: Did the patient discuss referral with their provider in the last year? Yes (If No - schedule appointment) (If Yes - send message)  Appointment offered? No  Type of order/referral and detailed reason for visit: due for colonoscopy   Preference of office, provider, location: Local   If referral order, have you been seen by this specialty before? yes (If Yes, this issue or another issue? When? Where?  Can we respond through MyChart? No

## 2023-07-10 ENCOUNTER — Telehealth: Payer: Self-pay

## 2023-07-10 NOTE — Telephone Encounter (Signed)
Patient has a appointment schedule for 07/15/2023 with Dr. Allegra Lai 2:00pm with Dr. Allegra Lai. Due to a scheduling problem we are needing to reschedule patient to 07/17/2023 at 1:45PM. Called patient and left a detail message and asked patient to give [place] a call back to let us know he received this message.

## 2023-07-11 NOTE — Telephone Encounter (Signed)
Called and patient states he has a appointment in [place] on Wednesday at that time so unable to do that day. Reschedule to 07/31/2023 with Dr Allegra Lai worked him in on that day

## 2023-07-12 ENCOUNTER — Ambulatory Visit
Admission: EM | Admit: 2023-07-12 | Discharge: 2023-07-12 | Disposition: A | Discharge status: Home / Self Care | Payer: Medicare Other | Attending: Emergency Medicine | Admitting: Emergency Medicine

## 2023-07-12 ENCOUNTER — Encounter: Payer: Self-pay | Admitting: Emergency Medicine

## 2023-07-12 DIAGNOSIS — E871 Hypo-osmolality and hyponatremia: Secondary | ICD-10-CM | POA: Diagnosis not present

## 2023-07-12 DIAGNOSIS — E876 Hypokalemia: Secondary | ICD-10-CM

## 2023-07-12 DIAGNOSIS — K529 Noninfective gastroenteritis and colitis, unspecified: Secondary | ICD-10-CM | POA: Diagnosis not present

## 2023-07-12 LAB — BASIC METABOLIC PANEL
Anion gap: 11 (ref 5–15)
BUN: 7 mg/dL (ref 6–20)
CO2: 24 mmol/L (ref 22–32)
Calcium: 9.1 mg/dL (ref 8.9–10.3)
Chloride: 96 mmol/L — ABNORMAL LOW (ref 98–111)
Creatinine, Ser: 0.68 mg/dL (ref 0.61–1.24)
GFR, Estimated: >60 mL/min (ref >60)
Glucose, Bld: 121 mg/dL — ABNORMAL HIGH (ref 70–99)
Potassium: 3.3 mmol/L — ABNORMAL LOW (ref 3.5–5.1)
Sodium: 131 mmol/L — ABNORMAL LOW (ref 135–145)

## 2023-07-12 MED ORDER — DICYCLOMINE HCL 20 MG PO TABS: 20.0000 mg | ORAL_TABLET | Freq: Two times a day (BID) | ORAL | 0 refills | Status: DC

## 2023-07-12 MED ORDER — ONDANSETRON 8 MG PO TBDP: 8.0000 mg | ORAL_TABLET | Freq: Three times a day (TID) | ORAL | 0 refills | Status: DC | PRN

## 2023-07-12 MED ORDER — ONDANSETRON 4 MG PO TBDP
4.0000 mg | ORAL_TABLET | Freq: Once | ORAL | Status: AC
  Administered 2023-07-12: 4 mg via ORAL
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
## 2023-07-12 NOTE — Discharge Instructions (Addendum)
Take the Zofran every 8 hours as needed for nausea and vomiting.  They are an oral disintegrating tablet and you can place them on her under your tongue and then will be absorbed.  Use the Bentyl (dicyclomine) every 6 hours as needed for abdominal cramping.  Follow a clear liquid diet for the next 6 to 12 hours.  Clear liquids consist of broth, ginger ale, water, Pedialyte, and Jell-O.  After 6 to 12 hours, if you are tolerating clear liquids, you can advance to bland foods such as bananas, rice, applesauce, and toast.  If you tolerate bland foods you can continue to advance your diet as you see fit.  If you develop a fever over 100.5, increased abdominal pain, bloody vomit, or bloody stool return for reevaluation or go to the ER.  

## 2023-07-12 NOTE — ED Triage Notes (Addendum)
Patient reports N/V for the past 3 days.  Patient denies any cold symptoms.  Patient denies fevers. Patient states that he stopped drinking alcohol 2 months ago.

## 2023-07-15 ENCOUNTER — Ambulatory Visit: Payer: Medicare Other | Admitting: Gastroenterology

## 2023-07-17 ENCOUNTER — Ambulatory Visit: Payer: Medicare Other | Admitting: Gastroenterology

## 2023-07-18 ENCOUNTER — Ambulatory Visit: Payer: Self-pay

## 2023-07-18 NOTE — Telephone Encounter (Signed)
  Chief Complaint: Lambert Mody pain in left lung with deep breath Symptoms: above Frequency: 2 days Pertinent Negatives: Patient denies  Disposition: [] ED /[x] Urgent Care (no appt availability in office) / [] Appointment(In office/virtual)/ []  [place] [facility]/ [] Home Care/ [] Refused Recommended Disposition /[] [place] [facility]/ []  Follow-up with PCP Additional Notes: Pts  states that when he takes a deep breath he gets a very sharp pain in his left lung.  Pt has hx of COPD. Pt will go to UC as there are no appts in office.   Reason for Disposition  [1] MILD difficulty breathing (e.g., minimal/no SOB at rest, SOB with walking, pulse <100) AND [2] NEW-onset or WORSE than normal  Answer Assessment - Initial Assessment Questions 1. RESPIRATORY STATUS: "Describe your breathing?" (e.g., wheezing, shortness of breath, unable to speak, severe coughing)      Pt states he has sharp pain in left lung with a deep breath 2. ONSET: "When did this breathing problem begin?"      2 days 3. PATTERN "Does the difficult breathing come and go, or has it been constant since it started?"      Left side  5. RECURRENT SYMPTOM: "Have you had difficulty breathing before?" If Yes, ask: "When was the last time?" and "What happened that time?"      Yes - copd 7. LUNG HISTORY: "Do you have any history of lung disease?"  (e.g., pulmonary embolus, asthma, emphysema)     copd 8. CAUSE: "What do you think is causing the breathing problem?"      Unsure  Protocols used: Breathing Difficulty-A-AH

## 2023-07-18 NOTE — Telephone Encounter (Signed)
Acknowledged. Agree with triage for this acute severe complaint  Saralyn Pilar, DO [facility] 07/18/2023, 2:32 PM

## 2023-07-22 ENCOUNTER — Institutional Professional Consult (permissible substitution) (INDEPENDENT_AMBULATORY_CARE_PROVIDER_SITE_OTHER): Payer: Medicare Other

## 2023-07-26 ENCOUNTER — Ambulatory Visit: Payer: Medicare Other | Admitting: Family Medicine

## 2023-07-31 ENCOUNTER — Ambulatory Visit: Payer: Medicare Other | Admitting: Gastroenterology

## 2023-08-01 ENCOUNTER — Other Ambulatory Visit: Payer: Self-pay | Admitting: Family Medicine

## 2023-08-01 DIAGNOSIS — E1142 Type 2 diabetes mellitus with diabetic polyneuropathy: Secondary | ICD-10-CM

## 2023-08-02 NOTE — Telephone Encounter (Signed)
discontinued on 04/23/2023 by Augusto Gamble, MD Stop Taking at Discharge.   Requested Prescriptions  Refused Prescriptions Disp Refills   gabapentin (NEURONTIN) 800 MG tablet [Pharmacy Med Name: GABAPENTIN 800 MG TABLET] 360 tablet 3    Sig: TAKE 1 TABLET BY MOUTH FOUR TIMES A DAY     Neurology: Anticonvulsants - gabapentin Passed - 08/01/2023  1:18 PM      Passed - Cr in normal range and within 360 days    Creat  Date Value Ref Range Status  08/07/2021 0.84 0.70 - 1.30 mg/dL Final   Creatinine, Ser  Date Value Ref Range Status  07/12/2023 0.68 0.61 - 1.24 mg/dL Final         Passed - Completed PHQ-2 or PHQ-9 in the last 360 days      Passed - Valid encounter within last 12 months    Recent Outpatient Visits           1 month ago Type 2 diabetes mellitus with vascular disease [facility])   [place] [facility] Smitty Cords, DO   11 months ago COPD with acute exacerbation [facility])   [place] [facility] Smitty Cords, DO   1 year ago Cellulitis of left forearm   [facility], Oswaldo Conroy, [place]   1 year ago Acute pain of left knee   [place] [facility] Smitty Cords, DO   1 year ago Spigelian hernia   [place] [facility] [place], Netta Neat, [place]

## 2023-08-03 IMAGING — CT CT HEAD W/O CM
5 of 6 series · 17 of 47 positions shown, 18 images · non-contrast
Comparison: None.

CLINICAL DATA: Headache, intracranial hemorrhage suspected

EXAM:
CT HEAD WITHOUT CONTRAST
TECHNIQUE: Contiguous axial images were obtained from the base of the skull
through the vertex without intravenous contrast.

[Series 2: head bone · axial · 0.43mm/px · z∈[+108,+228]mm · 7 of 86 slices shown]
[im 9/86  bone]
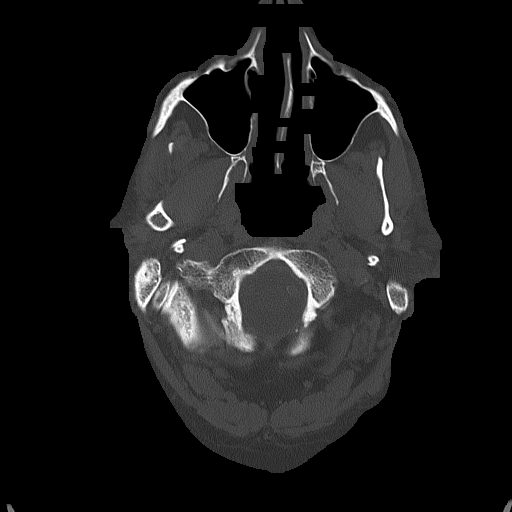
[im 18/86  bone]
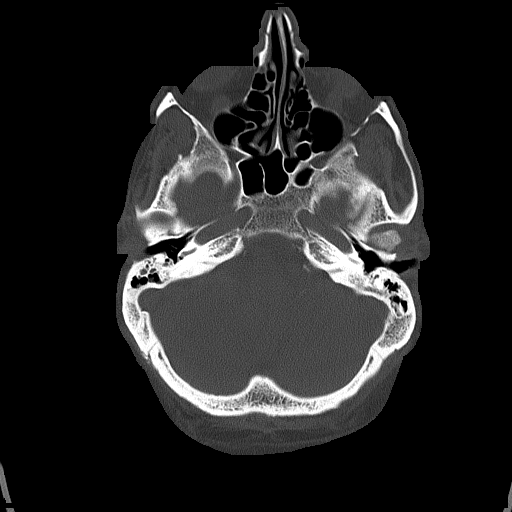
[im 26/86  bone]
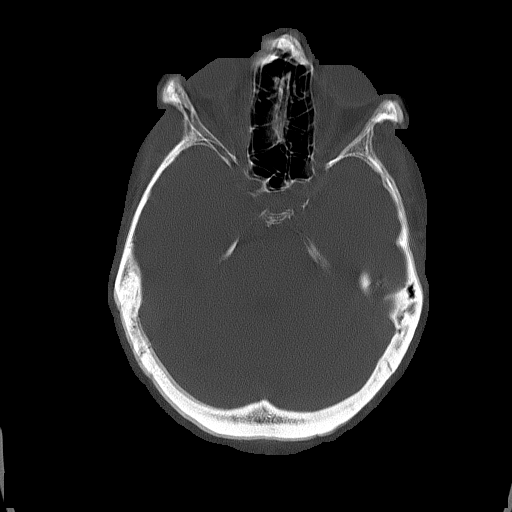
[im 35/86  bone]
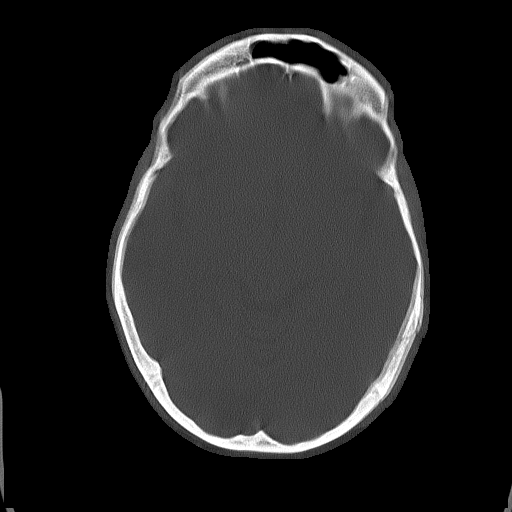
[im 52/86  bone]
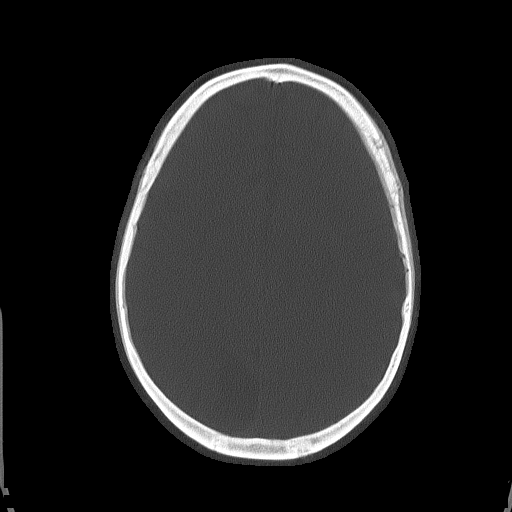
[im 60/86  bone]
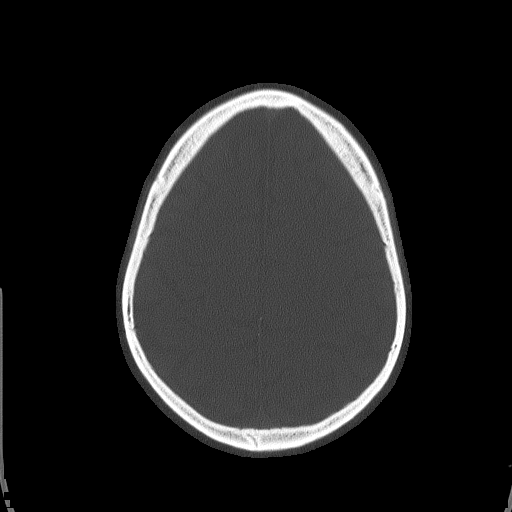
[im 69/86  bone]
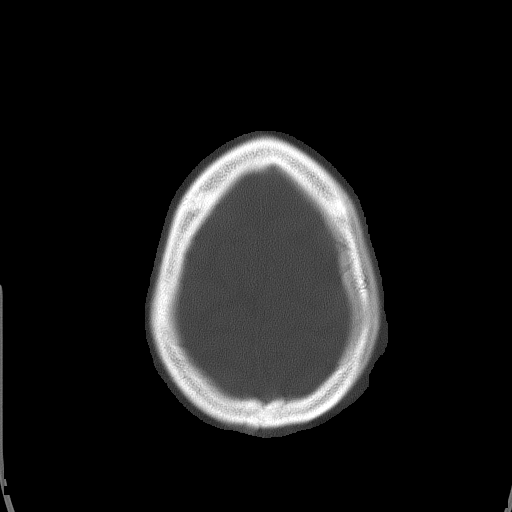

[Series 3: head wo · axial · 0.43mm/px · z∈[+147,+202]mm · 2 of 35 slices shown, 3 images (1 of 2)]
[im 12/35  brain]
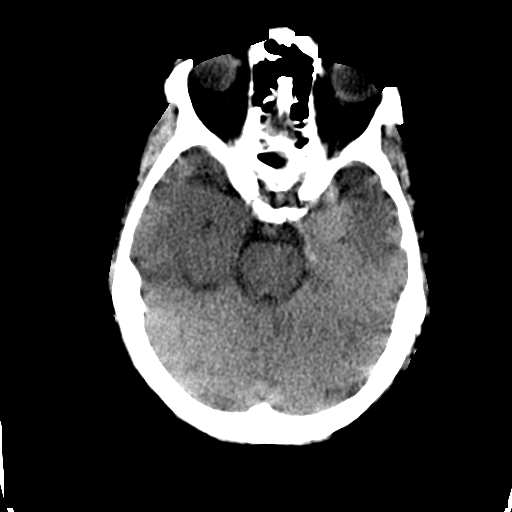
[im 12/35  bone]
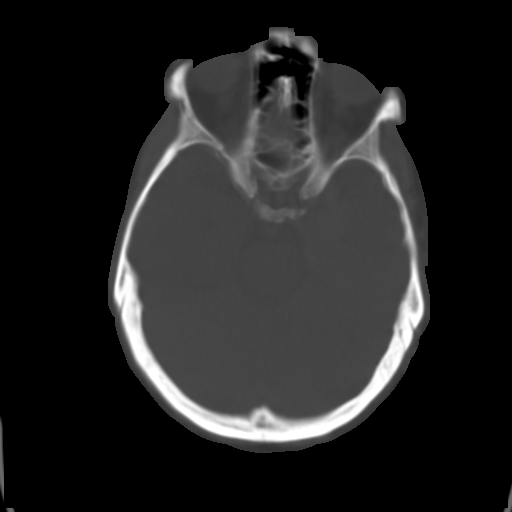
[im 23/35  brain]
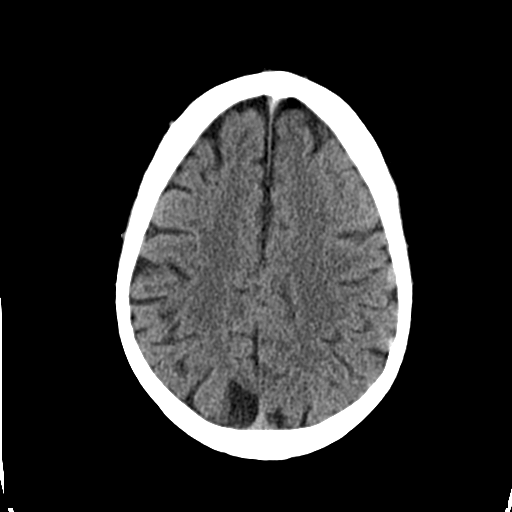

[Series 4: head wo · axial · 0.43mm/px · z∈[+147,+202]mm · 2 of 35 slices shown (2 of 2)]
[im 12/35  brain]
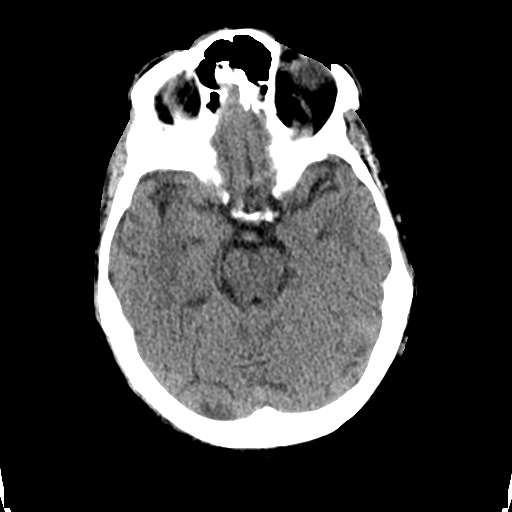
[im 23/35  brain]
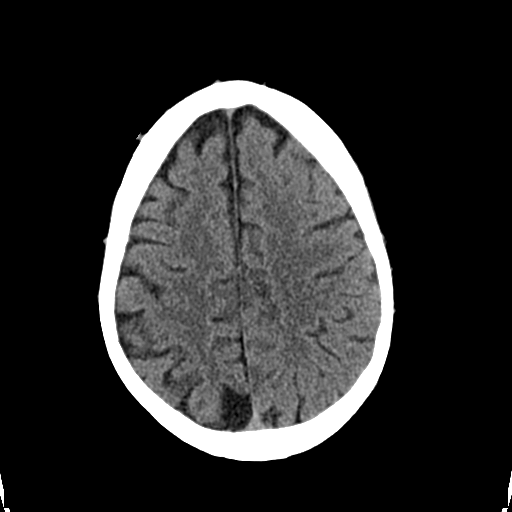

[Series 6: coronal soft tissue · coronal · 0.33mm/px · 3 of 78 slices shown]
[im 26/78  brain]
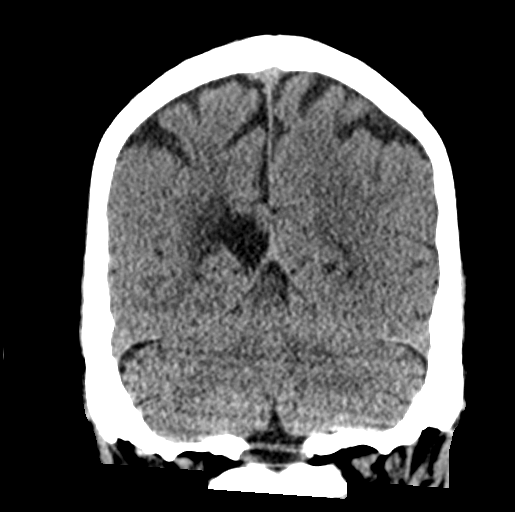
[im 35/78  brain]
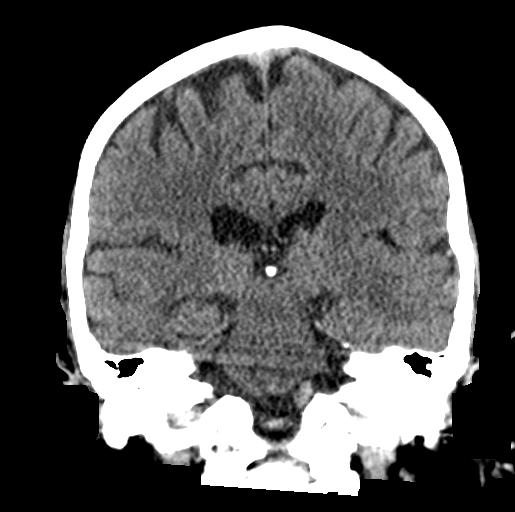
[im 43/78  brain]
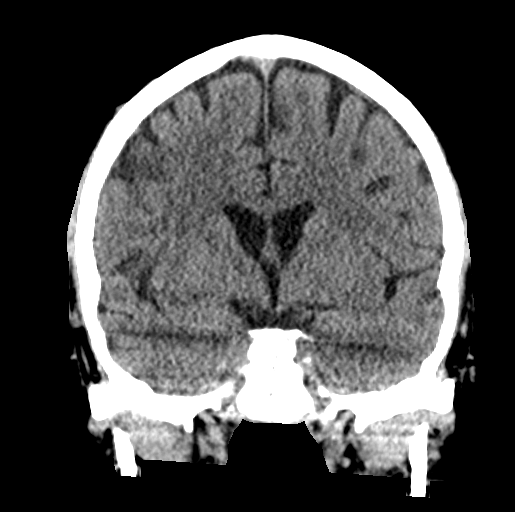

[Series 7: sagittal soft tissue · sagittal · 0.33mm/px · 3 of 58 slices shown]
[im 20/58  brain]
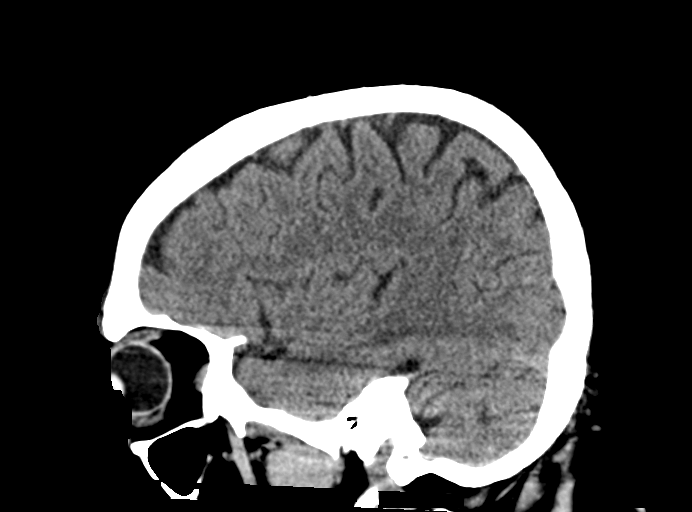
[im 29/58  brain]
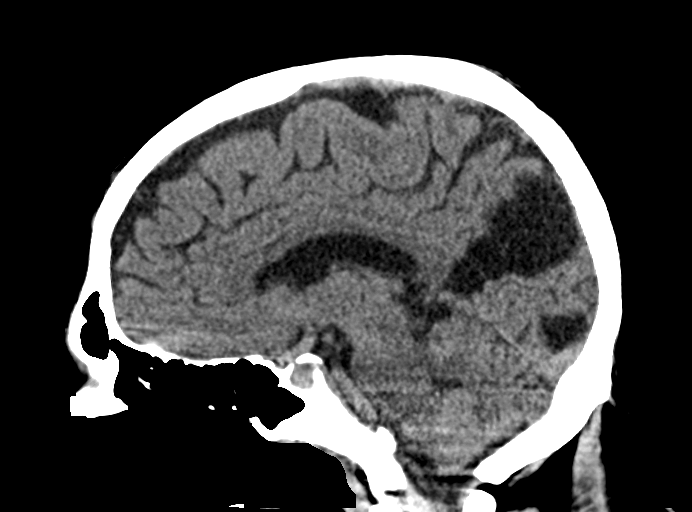
[im 39/58  brain]
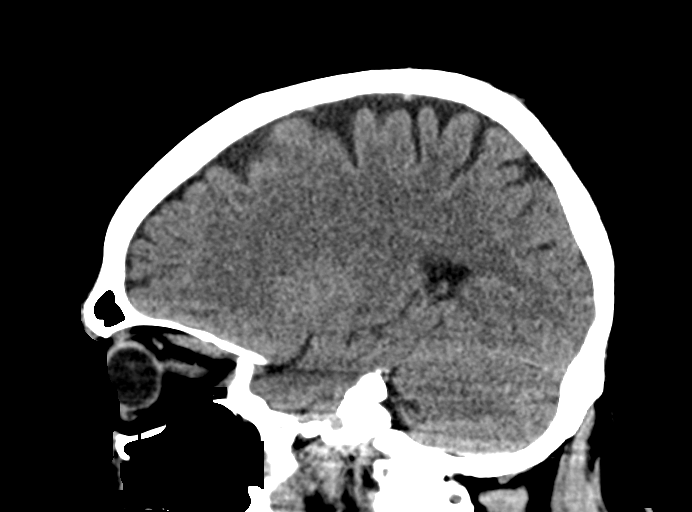

[17 of 47 positions shown; findings below may reference images not displayed]

FINDINGS: Brain: There is no acute intracranial hemorrhage, mass effect, or
edema. No acute appearing loss of gray-white differentiation.
Chronic infarcts of the right greater than left occipital lobes and
cerebellum. There is no extra-axial fluid collection. Ventricles and
sulci are within normal limits in size and configuration.

Vascular: There is mild atherosclerotic calcification at the skull
base.

Skull: Calvarium is unremarkable.

Sinuses/Orbits: No acute finding.

Other: None.
IMPRESSION: No acute intracranial abnormality.  Chronic infarcts detailed above.

## 2023-08-05 ENCOUNTER — Other Ambulatory Visit: Payer: Self-pay

## 2023-08-05 DIAGNOSIS — M5030 Other cervical disc degeneration, unspecified cervical region: Secondary | ICD-10-CM

## 2023-08-05 MED ORDER — GABAPENTIN 400 MG PO CAPS
800.0000 mg | ORAL_CAPSULE | Freq: Four times a day (QID) | ORAL | 0 refills | Status: DC
Start: 2023-08-05 — End: 2023-09-03

## 2023-08-05 NOTE — Telephone Encounter (Signed)
Copied from CRM [id number]. Topic: General - Other >> Aug 05, 2023 11:46 AM Franchot Heidelberg wrote: Reason for CRM: Pt called back regarding refill request, please advise. Wants refill [facility] to [facility] [place]. Also just scheduled next available

## 2023-08-09 DIAGNOSIS — H6983 Other specified disorders of Eustachian tube, bilateral: Secondary | ICD-10-CM | POA: Diagnosis not present

## 2023-08-09 DIAGNOSIS — H903 Sensorineural hearing loss, bilateral: Secondary | ICD-10-CM | POA: Diagnosis not present

## 2023-08-09 DIAGNOSIS — H6123 Impacted cerumen, bilateral: Secondary | ICD-10-CM | POA: Diagnosis not present

## 2023-08-12 ENCOUNTER — Ambulatory Visit: Payer: Medicare Other | Admitting: Family Medicine

## 2023-08-12 ENCOUNTER — Encounter: Payer: Self-pay | Admitting: Pharmacist

## 2023-08-12 NOTE — Progress Notes (Unsigned)
   08/12/2023  Patient ID: Tyler Martinez, male   DOB: 1970-11-08, 52 y.o.   MRN: [id number]  Pharmacy Quality Measure Review  This patient is appearing on report for being at risk of failing the measure for Statin Therapy for Patients with Cardiovascular Disease [facility]) and Statin Use in Persons with Diabetes (SUPD) medications this calendar year.   Prior trials of: rosuvastatin (last filled in 2022)  Note patient scheduled for appointment with PCP today. Collaborate with PCP

## 2023-08-15 ENCOUNTER — Other Ambulatory Visit: Payer: Self-pay | Admitting: Family Medicine

## 2023-08-15 DIAGNOSIS — M25311 Other instability, right shoulder: Secondary | ICD-10-CM

## 2023-08-15 DIAGNOSIS — M1712 Unilateral primary osteoarthritis, left knee: Secondary | ICD-10-CM

## 2023-08-15 MED ORDER — TRAMADOL HCL 50 MG PO TABS
50.0000 mg | ORAL_TABLET | ORAL | 0 refills | Status: DC | PRN
Start: 2023-08-15 — End: 2023-08-19

## 2023-08-15 NOTE — Telephone Encounter (Signed)
Requested medications are due for refill today.  unsure  Requested medications are on the active medications list.  yes  Last refill. 06/12/2023 #[id number] [id number]  Future visit scheduled.   yes  Notes to clinic.  Refill not delegated.    Requested Prescriptions  Pending Prescriptions Disp Refills   traMADol (ULTRAM) 50 MG tablet 42 tablet 0    Sig: Take 1 tablet (50 mg total) by mouth every 4 (four) hours as needed for moderate pain (pain score 4-6). For 7 days     Not Delegated - Analgesics:  Opioid Agonists Failed - 08/15/2023  2:42 PM      Failed - This refill cannot be delegated      Passed - Urine Drug Screen completed in last 360 days      Passed - Valid encounter within last 3 months    Recent Outpatient Visits           2 months ago Type 2 diabetes mellitus with vascular disease [facility])   [place] [facility] Smitty Cords, DO   11 months ago COPD with acute exacerbation [facility])   [place] [facility] Smitty Cords, DO   1 year ago Cellulitis of left forearm   [place] [facility], Oswaldo Conroy, [place]   1 year ago Acute pain of left knee   [place] [facility] Smitty Cords, DO   1 year ago Spigelian hernia   [facility] Althea Charon, Netta Neat, DO       Future Appointments             In 4 days Althea Charon, Netta Neat, DO [place] [facility]

## 2023-08-15 NOTE — Telephone Encounter (Signed)
Medication Refill -  Most Recent Primary Care Visit:  Provider: Smitty Cords  Department: [facility]  Visit Type: OFFICE VISIT  Date: 06/12/2023  Medication: traMADol (ULTRAM) 50 MG tablet [[id number]]   Has the patient contacted their pharmacy? Yes  (Agent: If yes, when and what did the pharmacy advise?) Call office, no refills left   Is this the correct pharmacy for this prescription? Yes  This is the patient's preferred pharmacy:  [id number] - [place], [place] - [address] [place] [address] Phone: [phone] Fax: [phone]  Has the prescription been filled recently? Yes  Is the patient out of the medication? Yes  Has the patient been seen for an appointment in the last year OR does the patient have an upcoming appointment? Yes  Can we respond through MyChart? No  Agent: Please be advised that Rx refills may take up to 3 business days. We ask that you follow-up with your pharmacy.   Pt is requesting a call when the medication has been sent to the pharmacy.

## 2023-08-19 ENCOUNTER — Ambulatory Visit (INDEPENDENT_AMBULATORY_CARE_PROVIDER_SITE_OTHER): Payer: Medicare Other | Admitting: Family Medicine

## 2023-08-19 ENCOUNTER — Encounter: Payer: Self-pay | Admitting: Family Medicine

## 2023-08-19 VITALS — BP 134/82 | HR 76 | Ht 72.0 in | Wt 200.0 lb

## 2023-08-19 DIAGNOSIS — R112 Nausea with vomiting, unspecified: Secondary | ICD-10-CM

## 2023-08-19 DIAGNOSIS — F1021 Alcohol dependence, in remission: Secondary | ICD-10-CM

## 2023-08-19 DIAGNOSIS — M25562 Pain in left knee: Secondary | ICD-10-CM

## 2023-08-19 DIAGNOSIS — K529 Noninfective gastroenteritis and colitis, unspecified: Secondary | ICD-10-CM

## 2023-08-19 DIAGNOSIS — G8929 Other chronic pain: Secondary | ICD-10-CM

## 2023-08-19 DIAGNOSIS — M25311 Other instability, right shoulder: Secondary | ICD-10-CM

## 2023-08-19 DIAGNOSIS — M1712 Unilateral primary osteoarthritis, left knee: Secondary | ICD-10-CM

## 2023-08-19 MED ORDER — METHYLPREDNISOLONE ACETATE 40 MG/ML IJ SUSP
40.0000 mg | Freq: Once | INTRAMUSCULAR | Status: DC
Start: 2023-08-19 — End: 2024-04-17

## 2023-08-19 MED ORDER — ONDANSETRON 8 MG PO TBDP
8.0000 mg | ORAL_TABLET | Freq: Three times a day (TID) | ORAL | 3 refills | Status: DC | PRN
Start: 2023-08-19 — End: 2024-01-16

## 2023-08-19 MED ORDER — LIDOCAINE HCL (PF) 1 % IJ SOLN
4.0000 mL | Freq: Once | INTRAMUSCULAR | Status: AC
Start: 2023-08-19 — End: ?

## 2023-08-19 MED ORDER — TRAMADOL HCL 50 MG PO TABS
50.0000 mg | ORAL_TABLET | ORAL | 0 refills | Status: DC | PRN
Start: 2023-08-23 — End: 2023-09-12

## 2023-08-19 NOTE — Progress Notes (Signed)
Subjective:    Patient ID: Tyler Martinez, male    DOB: 25-Nov-1970, 52 y.o.   MRN: [id number]  Tyler Martinez is a 52 y.o. male presenting on 08/19/2023 for Medical Management of Chronic Issues, Diabetes, and Hypertension   HPI  Discussed the use of AI scribe software for clinical note transcription with the patient, who gave verbal consent to proceed.  History of Present Illness     Alcohol Dependence in remission The patient, with a history of back pain and alcohol use, reports cessation of alcohol consumption for the past four months. He has received Vivitrol treatment with good success.   Gastritis / GERD Nausea vomiting  He continues to experience persistent stomach discomfort and daily episodes of nausea and vomiting. The patient describes these episodes as unpredictable, occurring regardless of the type of beverage consumed in the morning. The patient also reports a recent weight loss of approximately 18 pounds, which he attributes to decreased appetite due to the ongoing stomach issues. The patient is currently managing his nausea with a prescribed medication Zofran from ED, which he reports as effective.  Chronic Pain, Neck Knee Pain Bilateral, Osteoarthritis In addition to the gastrointestinal symptoms, the patient also reports persistent pain in the neck, shoulders, and legs. He describes the pain as radiating and similar to previous experiences with sciatic nerve pain. He is currently managing his pain with tramadol and gabapentin. Awaiting upcoming Neurology apt in January  He patient also reports a recent injury to his hand, which he has been managing with home care. He reports no ongoing bleeding from the wound and believes it is healing well.      Left Knee joint pain, interested in injection today        06/12/2023    3:29 PM 04/23/2023    8:45 AM 04/21/2023   10:36 AM  Depression screen PHQ 2/9  Decreased Interest 1    Down, Depressed, Hopeless 1     PHQ - 2 Score 2    Altered sleeping 1    Tired, decreased energy 2    Change in appetite     Feeling bad or failure about yourself  0    Trouble concentrating     Moving slowly or fidgety/restless 2    Suicidal thoughts 0    PHQ-9 Score 7    Difficult doing work/chores Very difficult       Information is confidential and restricted. Go to Review Flowsheets to unlock data.       06/12/2023    3:30 PM 03/09/2022    1:50 PM 10/16/2021    9:37 AM 06/05/2021    9:58 AM  GAD 7 : Generalized Anxiety Score  Nervous, Anxious, on Edge  0 1 1  Control/stop worrying 1 1 1  0  Worry too much - different things 1 1 1 1   Trouble relaxing 1 0 1 1  Restless 1 1 1 1   Easily annoyed or irritable 1 0 1 0  Afraid - awful might happen 0 0 1 1  Total GAD 7 Score  3 7 5   Anxiety Difficulty  Not difficult at all Not difficult at all Very difficult    Social History   Tobacco Use   Smoking status: Every Day    Current packs/day: 1.00    Average packs/day: 1 pack/day for 8.0 years (8.0 ttl pk-yrs)    Types: Cigarettes   Smokeless tobacco: Never  Vaping Use   Vaping status: Never Used  Substance Use Topics   Alcohol use: Not Currently    Alcohol/week: 2.0 standard drinks of alcohol    Types: 2 Standard drinks or equivalent per week    Comment: Drinks vodka every evening   Drug use: Not Currently    Types: Amphetamines    Review of Systems Per HPI unless specifically indicated above     Objective:    BP 134/82   Pulse 76   Ht 6' (1.829 m)   Wt 200 lb (90.7 kg)   SpO2 95%   BMI 27.12 kg/m   Wt Readings from Last 3 Encounters:  08/19/23 200 lb (90.7 kg)  07/12/23 210 lb (95.3 kg)  06/12/23 219 lb (99.3 kg)    Physical Exam Vitals and nursing note reviewed.  Constitutional:      General: He is not in acute distress.    Appearance: He is well-developed. He is not diaphoretic.     Comments: Well-appearing, comfortable, cooperative  HENT:     Head: Normocephalic and  atraumatic.  Eyes:     General:        Right eye: No discharge.        Left eye: No discharge.     Conjunctiva/sclera: Conjunctivae normal.  Neck:     Thyroid: No thyromegaly.  Cardiovascular:     Rate and Rhythm: Normal rate and regular rhythm.     Pulses: Normal pulses.     Heart sounds: Normal heart sounds. No murmur heard. Pulmonary:     Effort: Pulmonary effort is normal. No respiratory distress.     Breath sounds: Normal breath sounds. No wheezing or rales.  Musculoskeletal:        General: Normal range of motion.     Cervical back: Normal range of motion and neck supple.  Lymphadenopathy:     Cervical: No cervical adenopathy.  Skin:    General: Skin is warm and dry.     Findings: No erythema or rash.  Neurological:     Mental Status: He is alert and oriented to person, place, and time. Mental status is at baseline.  Psychiatric:        Behavior: Behavior normal.     Comments: Well groomed, good eye contact, normal speech and thoughts     Joint Injection/Arthrocentesis  Date/Time: 08/19/2023 3:15 PM  Performed by: Smitty Cords, DO Authorized by: Smitty Cords, DO  Indications: pain  Body area: knee Joint: left knee Local anesthesia used: yes  Anesthesia: Local anesthesia used: yes Local anesthetic: Ethyl Chloride Spray.  Sedation: Patient sedated: no  Needle gauge: 21 G. Ultrasound guidance: no Approach: lateral Aspirate amount: 0 mL Methylprednisolone amount: 40 mg Lidocaine 1% amount: 4 mL Patient tolerance: patient tolerated the procedure well with no immediate complications      Results for orders placed or performed during the hospital encounter of 07/12/23  Basic metabolic panel   Collection Time: 07/12/23  6:18 PM  Result Value Ref Range   Sodium 131 (L) 135 - 145 mmol/L   Potassium 3.3 (L) 3.5 - 5.1 mmol/L   Chloride 96 (L) 98 - 111 mmol/L   CO2 24 22 - 32 mmol/L   Glucose, Bld 121 (H) 70 - 99 mg/dL   BUN 7 6 -  20 mg/dL   Creatinine, Ser 1.19 0.61 - 1.24 mg/dL   Calcium 9.1 8.9 - 14.7 mg/dL   GFR, Estimated >82 >95 mL/min   Anion gap 11 5 - 15      Assessment &  Plan:   Problem List Items Addressed This Visit     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)   Other Visit Diagnoses       Gastroenteritis    -  Primary   Relevant Orders   Ambulatory referral to Gastroenterology     Nausea and vomiting, unspecified vomiting type       Relevant Medications   ondansetron (ZOFRAN-ODT) 8 MG disintegrating tablet   Other Relevant Orders   Ambulatory referral to Gastroenterology     Other instability, right shoulder       Relevant Medications   traMADol (ULTRAM) 50 MG tablet (Start on 08/23/2023)     Primary osteoarthritis of left knee       Relevant Medications   traMADol (ULTRAM) 50 MG tablet (Start on 08/23/2023)   lidocaine (PF) (XYLOCAINE) 1 % injection 4 mL   methylPREDNISolone acetate (DEPO-MEDROL) injection 40 mg     Chronic pain of left knee       Relevant Medications   traMADol (ULTRAM) 50 MG tablet (Start on 08/23/2023)   lidocaine (PF) (XYLOCAINE) 1 % injection 4 mL   methylPREDNISolone acetate (DEPO-MEDROL) injection 40 mg         Alcohol Use Disorder Abstinence for 4 months. -Continue abstinence.  Gastritis Nausea vomiting Daily nausea and vomiting, not improved with cessation of alcohol. -Continue Ondansetron as needed for nausea. -Referral to Gastroenterology for further evaluation. Switch to [facility] by preference. -Plan for colonoscopy and endoscopy.  Neuropathic Pain Neck Pain / Sciatica / Nerve impingement Radiating pain in right arm and both legs, possibly related to back or neck. -Continue Gabapentin 400mg  x 2 800mg  4 times daily on max dose - On Tramadol AS NEEDED Already referred to Neuro, apt 09/16/23  Left Knee Osteoarthritis / Pain Completed L Knee cortisone injection today, tolerated well, see procedure note  Chronic Pain  Management -Continue Tramadol as needed for pain. -Refill Tramadol on 08/23/2023.  Wound Care Healing laceration on hand. -Continue current wound care regimen.         Orders Placed This Encounter  Procedures   Joint Injection/Arthrocentesis    This order was created via procedure documentation   Ambulatory referral to Gastroenterology    Referral Priority:   Routine    Referral Type:   Consultation    Referral Reason:   Specialty Services Required    Number of Visits Requested:   1    Meds ordered this encounter  Medications   ondansetron (ZOFRAN-ODT) 8 MG disintegrating tablet    Sig: Take 1 tablet (8 mg total) by mouth every 8 (eight) hours as needed for nausea or vomiting.    Dispense:  30 tablet    Refill:  3   traMADol (ULTRAM) 50 MG tablet    Sig: Take 1 tablet (50 mg total) by mouth every 4 (four) hours as needed for moderate pain (pain score 4-6). For 7 days    Dispense:  42 tablet    Refill:  0    First fill 08/23/23. This request is for a new prescription for a controlled substance as required by Federal/State law. DX Code Needed  .M25.511   lidocaine (PF) (XYLOCAINE) 1 % injection 4 mL   methylPREDNISolone acetate (DEPO-MEDROL) injection 40 mg    Follow up plan: Return if symptoms worsen or fail to improve.   Saralyn Pilar, DO [facility] 08/19/2023, 2:43 PM

## 2023-08-19 NOTE — Patient Instructions (Addendum)
Thank you for coming to the office today.  Refilled Zofran dissolving tab.  Upcoming Neurologist at [facility] 09/16/23  [facility] - Neurology Dept [address] [place], [place] [address] Phone: [phone]  ------  Referral to [facility] for gastroenteritis.  [facility] [address] [place], [place] [address] Hours: 8AM-5PM Phone: [phone]  Refilled Tramadol for fill 08/23/23  Please schedule a Follow-up Appointment to: Return if symptoms worsen or fail to improve.  If you have any other questions or concerns, please feel free to call the office or send a message through MyChart. You may also schedule an earlier appointment if necessary.  Additionally, you may be receiving a survey about your experience at our office within a few days to 1 week by e-mail or mail. We value your feedback.  Saralyn Pilar, DO [facility], [place]

## 2023-08-22 ENCOUNTER — Institutional Professional Consult (permissible substitution) (INDEPENDENT_AMBULATORY_CARE_PROVIDER_SITE_OTHER): Payer: Medicare Other

## 2023-08-31 ENCOUNTER — Other Ambulatory Visit: Payer: Self-pay | Admitting: Family Medicine

## 2023-08-31 DIAGNOSIS — I10 Essential (primary) hypertension: Secondary | ICD-10-CM

## 2023-08-31 DIAGNOSIS — I2510 Atherosclerotic heart disease of native coronary artery without angina pectoris: Secondary | ICD-10-CM

## 2023-09-01 ENCOUNTER — Other Ambulatory Visit: Payer: Self-pay | Admitting: Family Medicine

## 2023-09-01 DIAGNOSIS — M5030 Other cervical disc degeneration, unspecified cervical region: Secondary | ICD-10-CM

## 2023-09-03 NOTE — Telephone Encounter (Signed)
 Requested medications are due for refill today.  unsure  Requested medications are on the active medications list.  yes  Last refill. 04/22/2023  Future visit scheduled.   yes  Notes to clinic.  Medication is historical    Requested Prescriptions  Pending Prescriptions Disp Refills   carvedilol  (COREG ) 12.5 MG tablet [Pharmacy Med Name: CARVEDILOL  12.5 MG TABLET] 180 tablet     Sig: TAKE 1 TABLET (12.5MG  TOTAL) BY MOUTH TWICE A DAY WITH MEALS     Cardiovascular: Beta Blockers 3 Failed - 09/03/2023 12:05 PM      Failed - AST in normal range and within 360 days    AST  Date Value Ref Range Status  04/18/2023 138 (H) 15 - 41 U/L Final         Failed - ALT in normal range and within 360 days    ALT  Date Value Ref Range Status  04/18/2023 86 (H) 0 - 44 U/L Final         Passed - Cr in normal range and within 360 days    Creat  Date Value Ref Range Status  08/07/2021 0.84 0.70 - 1.30 mg/dL Final   Creatinine, Ser  Date Value Ref Range Status  07/12/2023 0.68 0.61 - 1.24 mg/dL Final         Passed - Last BP in normal range    BP Readings from Last 1 Encounters:  08/19/23 134/82         Passed - Last Heart Rate in normal range    Pulse Readings from Last 1 Encounters:  08/19/23 76         Passed - Valid encounter within last 6 months    Recent Outpatient Visits           2 weeks ago Gastroenteritis   [place] [facility] [place], Marsa PARAS, [place]   2 months ago Type 2 diabetes mellitus with vascular disease [facility])   [place] [facility] [place] Marsa PARAS, [place]   1 year ago COPD with acute exacerbation [facility])   [place] [facility] [place] Marsa PARAS, [place]   1 year ago Cellulitis of left forearm   [place] [facility] M[place]um, Rocky B[place], [place]   1 year ago Acute pain of left knee   [facility] [place], Marsa PARAS, [place]

## 2023-09-03 NOTE — Telephone Encounter (Signed)
 Requested medication (s) are due for refill today: Yes  Requested medication (s) are on the active medication list: Yes  Last refill:  08/05/23  Future visit scheduled: No  Notes to clinic:  Rx due to expire tomorrow, unsure if refill is appropriate     Requested Prescriptions  Pending Prescriptions Disp Refills   gabapentin  (NEURONTIN ) 400 MG capsule [Pharmacy Med Name: GABAPENTIN  400 MG CAPSULE] 240 capsule 0    Sig: Take 2 capsules (800 mg total) by mouth 4 (four) times daily.     Neurology: Anticonvulsants - gabapentin  Passed - 09/03/2023  4:16 PM      Passed - Cr in normal range and within 360 days    Creat  Date Value Ref Range Status  08/07/2021 0.84 0.70 - 1.30 mg/dL Final   Creatinine, Ser  Date Value Ref Range Status  07/12/2023 0.68 0.61 - 1.24 mg/dL Final         Passed - Completed PHQ-2 or PHQ-9 in the last 360 days      Passed - Valid encounter within last 12 months    Recent Outpatient Visits           2 weeks ago Gastroenteritis   [place] [facility] [place], Marsa PARAS, DO   2 months ago Type 2 diabetes mellitus with vascular disease [facility])   [facility] [place] Marsa PARAS, [place]   1 year ago COPD with acute exacerbation [facility])   [place] [facility] [place] Marsa PARAS, [place]   1 year ago Cellulitis of left forearm   [place] [facility] [place]um, Rocky [place], [place]   1 year ago Acute pain of left knee   [facility] [place], Marsa PARAS, [place]

## 2023-09-11 ENCOUNTER — Other Ambulatory Visit: Payer: Self-pay | Admitting: Family Medicine

## 2023-09-11 DIAGNOSIS — M25311 Other instability, right shoulder: Secondary | ICD-10-CM

## 2023-09-11 DIAGNOSIS — M1712 Unilateral primary osteoarthritis, left knee: Secondary | ICD-10-CM

## 2023-09-11 NOTE — Telephone Encounter (Signed)
 Requested medication (s) are due for refill today - unsure  Requested medication (s) are on the active medication list -yes  Future visit scheduled -no  Last refill: 08/23/23 #42  Notes to clinic: non delegated Rx  Requested Prescriptions  Pending Prescriptions Disp Refills   traMADol  (ULTRAM ) 50 MG tablet [Pharmacy Med Name: TRAMADOL  HCL 50 MG TABLET] 42 tablet 0    Sig: Take 1 tablet (50 mg total) by mouth every 4 (four) hours as needed for moderate pain (pain score 4-6). For 7 days     Not Delegated - Analgesics:  Opioid Agonists Failed - 09/11/2023  3:59 PM      Failed - This refill cannot be delegated      Passed - Urine Drug Screen completed in last 360 days      Passed - Valid encounter within last 3 months    Recent Outpatient Visits           3 weeks ago Gastroenteritis   [place] [place], Kayleen Party, DO   3 months ago Type 2 diabetes mellitus with vascular disease [facility])   [place] [facility] [place], Kayleen Party, DO   1 year ago COPD with acute exacerbation [facility])   [place] [facility] Raina Bunting, [place]   1 year ago Cellulitis of left forearm   [place] [facility] Mecum, Pearla Bottom, PA-C   1 year ago Acute pain of left knee   [place] [facility] Raina Bunting, DO                 Requested Prescriptions  Pending Prescriptions Disp Refills   traMADol  (ULTRAM ) 50 MG tablet [Pharmacy Med Name: TRAMADOL  HCL 50 MG TABLET] 42 tablet 0    Sig: Take 1 tablet (50 mg total) by mouth every 4 (four) hours as needed for moderate pain (pain score 4-6). For 7 days     Not Delegated - Analgesics:  Opioid Agonists Failed - 09/11/2023  3:59 PM      Failed - This refill cannot be delegated      Passed - Urine Drug Screen completed in last 360 days      Passed - Valid encounter within last 3 months    Recent Outpatient Visits            3 weeks ago Gastroenteritis   [place] [facility] [place], Kayleen Party, DO   3 months ago Type 2 diabetes mellitus with vascular disease [facility])   [place] [facility] Raina Bunting, DO   1 year ago COPD with acute exacerbation [facility])   [place] [facility] Raina Bunting, DO   1 year ago Cellulitis of left forearm    [facility], Pearla Bottom, [place]   1 year ago Acute pain of left knee   [facility] [employer], Kayleen Party, [place]

## 2023-10-09 ENCOUNTER — Ambulatory Visit: Payer: Medicare Other | Admitting: Family Medicine

## 2023-10-09 ENCOUNTER — Ambulatory Visit
Admission: EM | Admit: 2023-10-09 | Discharge: 2023-10-09 | Disposition: A | Discharge status: Home / Self Care | Payer: Medicare Other | Attending: Emergency Medicine | Admitting: Emergency Medicine

## 2023-10-09 DIAGNOSIS — S61213A Laceration without foreign body of left middle finger without damage to nail, initial encounter: Secondary | ICD-10-CM

## 2023-10-09 DIAGNOSIS — S61112A Laceration without foreign body of left thumb with damage to nail, initial encounter: Secondary | ICD-10-CM | POA: Diagnosis not present

## 2023-10-09 NOTE — Discharge Instructions (Addendum)
Keep your wounds clean and dry.  Wash them gently twice a day with soap and water.      Your stitches need to be taken out in 7 to 10 days.    Follow-up right away if you see signs of infection, such as increased pain, redness, pus-like drainage, warmth, fever, chills, or other concerning symptoms.
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
## 2023-10-09 NOTE — ED Triage Notes (Signed)
Patient to Urgent Care with complaints of two lacerations to his left hand- thumb/ index finger.   Injury occurred today. Reports that he cut himself on a razor knife. Bleeding well controlled with towel.   TDAP 2 years ago (per patient).

## 2023-10-15 ENCOUNTER — Other Ambulatory Visit: Payer: Self-pay | Admitting: Family Medicine

## 2023-10-15 ENCOUNTER — Encounter: Payer: Self-pay | Admitting: Family Medicine

## 2023-10-15 ENCOUNTER — Ambulatory Visit: Payer: Medicare Other | Admitting: Family Medicine

## 2023-10-15 VITALS — BP 132/84 | HR 74 | Ht 72.0 in | Wt 187.0 lb

## 2023-10-15 DIAGNOSIS — K529 Noninfective gastroenteritis and colitis, unspecified: Secondary | ICD-10-CM

## 2023-10-15 DIAGNOSIS — R112 Nausea with vomiting, unspecified: Secondary | ICD-10-CM

## 2023-10-15 DIAGNOSIS — R634 Abnormal weight loss: Secondary | ICD-10-CM | POA: Diagnosis not present

## 2023-10-15 DIAGNOSIS — R152 Fecal urgency: Secondary | ICD-10-CM

## 2023-10-15 DIAGNOSIS — I10 Essential (primary) hypertension: Secondary | ICD-10-CM

## 2023-10-15 DIAGNOSIS — R229 Localized swelling, mass and lump, unspecified: Secondary | ICD-10-CM | POA: Diagnosis not present

## 2023-10-15 MED ORDER — DICYCLOMINE HCL 20 MG PO TABS
20.0000 mg | ORAL_TABLET | Freq: Three times a day (TID) | ORAL | 0 refills | Status: DC
Start: 2023-10-15 — End: 2023-10-28

## 2023-10-15 MED ORDER — AMLODIPINE BESYLATE 10 MG PO TABS
10.0000 mg | ORAL_TABLET | Freq: Every day | ORAL | 1 refills | Status: DC
Start: 2023-10-15 — End: 2024-01-16

## 2023-10-15 MED ORDER — LISINOPRIL 40 MG PO TABS
40.0000 mg | ORAL_TABLET | Freq: Every day | ORAL | 1 refills | Status: DC
Start: 2023-10-15 — End: 2024-01-16

## 2023-10-15 NOTE — Patient Instructions (Addendum)
Thank you for coming to the office today.  Rx Dicyclomine as needed for abdominal urgency and cramping  Keep on Zofran as needed.  Keep up with GI doctor  For mass on face, I will refer to ENT or Plastic Surgeon, stay tuned for referral.   Visit Details  Encounter Start  12/10/2023  9:00 AM   Encounter End  12/10/2023  9:30 AM   Admission Type  --      Patient Class: -- Authorization Number: --  Pre-Certification Number: --     Providers  Otilio Miu, PA Physician Assistant NPI: [id number] [place] [address]   Phone: [phone] Fax: [phone]   Jamey Reas, MD Gastroenterology NPI: [id number] [address] [place] [address]   Phone: [phone] Fax: [phone]     Please schedule a Follow-up Appointment to: Return if symptoms worsen or fail to improve.  If you have any other questions or concerns, please feel free to call the office or send a message through MyChart. You may also schedule an earlier appointment if necessary.  Additionally, you may be receiving a survey about your experience at our office within a few days to 1 week by e-mail or mail. We value your feedback.  Saralyn Pilar, DO [facility], [place]

## 2023-10-15 NOTE — Progress Notes (Unsigned)
Subjective:    Patient ID: Tyler Martinez, male    DOB: 1970-09-08, 53 y.o.   MRN: [id number]  Tyler Martinez is a 53 y.o. male presenting on 10/15/2023 for Nausea and Unintentional Weight Loss   HPI  Discussed the use of AI scribe software for clinical note transcription with the patient, who gave verbal consent to proceed.  History of Present Illness   Tyler Martinez is a 53 year old male who presents with unintentional weight loss and gastrointestinal symptoms.  Recently referred to GI 07/2023, now has upcoming apt  He has experienced unintentional weight loss over the past six months, decreasing from 213 pounds to 187 pounds without any intentional dietary changes or increased physical activity. His dietary intake remains consistent, typically consuming eggs, bacon, and cheese for breakfast, and a bologna sandwich or similar for dinner. Despite this, he continues to lose weight. He experiences occasional bowel urgency and loose stools, which he attributes to being 'backed up'.  He experiences nausea, which is managed with Zofran medication that he finds effective.   He has a history of gastritis and abdominal discomfort, for which he was prescribed dicyclomine for abdominal cramping and urgency, although he is unsure if he ever filled the prescription. Symptoms are not constant and have been manageable recently.  He reports a persistent, hard, non-painful mass on his face, which has been present for several years. A previous doctor assessed it as non-cancerous, but he is interested in having it removed due to cosmetic concerns.        10/15/2023    3:19 PM 06/12/2023    3:29 PM 04/23/2023    8:45 AM  Depression screen PHQ 2/9  Decreased Interest 1 1   Down, Depressed, Hopeless 0 1   PHQ - 2 Score 1 2   Altered sleeping 0 1   Tired, decreased energy 1 2   Change in appetite 1    Feeling bad or failure about yourself   0   Trouble concentrating 1    Moving slowly or  fidgety/restless 0 2   Suicidal thoughts 0 0   PHQ-9 Score 4 7   Difficult doing work/chores  Very difficult      Information is confidential and restricted. Go to Review Flowsheets to unlock data.       10/15/2023    3:19 PM 06/12/2023    3:30 PM 03/09/2022    1:50 PM 10/16/2021    9:37 AM  GAD 7 : Generalized Anxiety Score  Nervous, Anxious, on Edge 0  0 1  Control/stop worrying 1 1 1 1   Worry too much - different things 0 1 1 1   Trouble relaxing 0 1 0 1  Restless  1 1 1   Easily annoyed or irritable 0 1 0 1  Afraid - awful might happen 0 0 0 1  Total GAD 7 Score   3 7  Anxiety Difficulty   Not difficult at all Not difficult at all    Social History   Tobacco Use   Smoking status: Every Day    Current packs/day: 1.00    Average packs/day: 1 pack/day for 8.0 years (8.0 ttl pk-yrs)    Types: Cigarettes   Smokeless tobacco: Never  Vaping Use   Vaping status: Never Used  Substance Use Topics   Alcohol use: Not Currently    Alcohol/week: 2.0 standard drinks of alcohol    Types: 2 Standard drinks or equivalent per week    Comment:  Drinks vodka every evening   Drug use: Not Currently    Types: Amphetamines    Review of Systems Per HPI unless specifically indicated above     Objective:    BP 132/84   Pulse 74   Ht 6' (1.829 m)   Wt 187 lb (84.8 kg)   SpO2 94%   BMI 25.36 kg/m   Wt Readings from Last 3 Encounters:  10/15/23 187 lb (84.8 kg)  08/19/23 200 lb (90.7 kg)  07/12/23 210 lb (95.3 kg)    Physical Exam Vitals and nursing note reviewed.  Constitutional:      General: He is not in acute distress.    Appearance: He is well-developed. He is not diaphoretic.     Comments: Well-appearing but has had some weight loss, comfortable, cooperative  HENT:     Head: Normocephalic and atraumatic.  Eyes:     General:        Right eye: No discharge.        Left eye: No discharge.     Conjunctiva/sclera: Conjunctivae normal.  Neck:     Thyroid: No thyromegaly.   Cardiovascular:     Rate and Rhythm: Normal rate and regular rhythm.     Pulses: Normal pulses.     Heart sounds: Normal heart sounds. No murmur heard. Pulmonary:     Effort: Pulmonary effort is normal. No respiratory distress.     Breath sounds: Normal breath sounds. No wheezing or rales.  Musculoskeletal:        General: Normal range of motion.     Cervical back: Normal range of motion and neck supple.  Lymphadenopathy:     Cervical: No cervical adenopathy.  Skin:    General: Skin is warm and dry.     Findings: Lesion (3 x 3 approx mobile smooth well defined superficial nodular density of left face superior to lips and inferior lateral to nose) present. No erythema or rash.  Neurological:     Mental Status: He is alert and oriented to person, place, and time. Mental status is at baseline.  Psychiatric:        Behavior: Behavior normal.     Comments: Well groomed, good eye contact, normal speech and thoughts     Left Face / Cheek    I have personally reviewed the radiology report from 03/05/22 on CT Abdomen Pelvis.  CLINICAL DATA:  Left lower quadrant pain   EXAM: CT ABDOMEN AND PELVIS WITH CONTRAST   TECHNIQUE: Multidetector CT imaging of the abdomen and pelvis was performed using the standard protocol following bolus administration of intravenous contrast.   RADIATION DOSE REDUCTION: This exam was performed according to the departmental dose-optimization program which includes automated exposure control, adjustment of the mA and/or kV according to patient size and/or use of iterative reconstruction technique.   CONTRAST:  OMNIPAQUE IOHEXOL 300 MG/ML  SOLN   COMPARISON:  None Available.   FINDINGS: Lower chest: No acute abnormality.   Hepatobiliary: Small focus of ill-defined enhancement in the right hepatic lobe (series 2, image 34). Liver is otherwise unremarkable. Gallbladder is contracted. No biliary dilatation.   Pancreas: Unremarkable. No  pancreatic ductal dilatation or surrounding inflammatory changes.   Spleen: Unremarkable.   Adrenals/Urinary Tract: Right adrenal is unremarkable. Stable 3 cm left adrenal nodule. Previously measured density consistent with adenoma. Kidneys are unremarkable. Mild bladder wall thickening.   Stomach/Bowel: Stomach is within normal limits. Bowel is normal in caliber. Normal appendix. Colonic diverticulosis.   Vascular/Lymphatic: Atherosclerosis.  No enlarged nodes.   Reproductive: Unremarkable.   Other: No free fluid.  Abdominal wall is unremarkable.   Musculoskeletal: No acute osseous abnormality.   IMPRESSION: No acute abnormality.   Colonic diverticulosis.   Small focus of enhancement within the right hepatic lobe is indeterminate and may reflect a flash filling hemangioma. This can be evaluated as an outpatient with MRI.     Electronically Signed   By: Guadlupe Spanish M.D.   On: 03/05/2022 18:30  Results for orders placed or performed during the hospital encounter of 07/12/23  Basic metabolic panel   Collection Time: 07/12/23  6:18 PM  Result Value Ref Range   Sodium 131 (L) 135 - 145 mmol/L   Potassium 3.3 (L) 3.5 - 5.1 mmol/L   Chloride 96 (L) 98 - 111 mmol/L   CO2 24 22 - 32 mmol/L   Glucose, Bld 121 (H) 70 - 99 mg/dL   BUN 7 6 - 20 mg/dL   Creatinine, Ser 1.61 0.61 - 1.24 mg/dL   Calcium 9.1 8.9 - 09.6 mg/dL   GFR, Estimated >04 >54 mL/min   Anion gap 11 5 - 15      Assessment & Plan:   Problem List Items Addressed This Visit   None Visit Diagnoses       Unintentional weight loss    -  Primary     Nausea and vomiting, unspecified vomiting type         Gastroenteritis       Relevant Medications   dicyclomine (BENTYL) 20 MG tablet     Fecal urgency       Relevant Medications   dicyclomine (BENTYL) 20 MG tablet     Subcutaneous nodule            Chronic Gastritis / GERD / Nausea Heavy Alcohol Use history Unintentional Weight Loss Already been  referred to GI at last apt 2 month ago. Upcoming apt with GI 12/10/23 Significant weight loss over the past six months without changes in diet or exercise. Possible relation to cessation of alcohol consumption and complicating symptoms with nausea and abdominal pain however he still is managing regular diet. He is due for Colonoscopy / EGD and management of constellation of symptoms, already been on variety of Rx Management and lifestyle interventions  Abdominal Cramping and Urgency Intermittent abdominal cramping and urgency, possibly related to digestive issues. -Prescribe dicyclomine (Bentyl) to be taken as needed with meals or at bedtime.  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 -Refer to Derm vs ENT vs Plastic Surgery for evaluation and possible removal.  Hypertension Managed with medication. -Renew blood pressure medication prescription.  Knee Pain Chronic knee pain managed with periodic injections. -Next injection due after November 17, 2023.  General Health Maintenance -Continue Metformin for blood glucose control. -Check status of other medications with pharmacy.      No orders of the defined types were placed in this encounter.   Meds ordered this encounter  Medications   dicyclomine (BENTYL) 20 MG tablet    Sig: Take 1 tablet (20 mg total) by mouth 4 (four) times daily -  before meals and at bedtime.    Dispense:  60 tablet    Refill:  0    Follow up plan: Return if symptoms worsen or fail to improve.   Saralyn Pilar, DO [facility] 10/15/2023, 2:58 PM

## 2023-10-25 ENCOUNTER — Other Ambulatory Visit: Payer: Self-pay | Admitting: Family Medicine

## 2023-10-25 DIAGNOSIS — R152 Fecal urgency: Secondary | ICD-10-CM

## 2023-10-25 DIAGNOSIS — K529 Noninfective gastroenteritis and colitis, unspecified: Secondary | ICD-10-CM

## 2023-10-25 DIAGNOSIS — M5030 Other cervical disc degeneration, unspecified cervical region: Secondary | ICD-10-CM

## 2023-10-25 NOTE — Telephone Encounter (Signed)
 Copied from CRM [id number]. Topic: Clinical - Medication Refill >> Oct 25, 2023  1:31 PM [name] D wrote: Most Recent Primary Care Visit:  Provider: Smitty Cords  Department: [id number]  Visit Type: OFFICE VISIT  Date: 08/19/2023  Medication: gabapentin (NEURONTIN) 400 MG capsule  Has the patient contacted their pharmacy? Yes (Agent: If no, request that the patient contact the pharmacy for the refill. If patient does not wish to contact the pharmacy document the reason why and proceed with request.) (Agent: If yes, when and what did the pharmacy advise?) Patient was told it would be filled at a later date.  Is this the correct pharmacy for this prescription? Yes If no, delete pharmacy and type the correct one.  This is the patient's preferred pharmacy:  [id number] - [place],  - [address] [place] [address] Phone: [phone] Fax: [phone]   Has the prescription been filled recently? No  Is the patient out of the medication? Yes  Has the patient been seen for an appointment in the last year OR does the patient have an upcoming appointment? Yes  Can we respond through MyChart? No, Patient wants to be called back   Agent: Please be advised that Rx refills may take up to 3 business days. We ask that you follow-up with your pharmacy. >> Oct 25, 2023  1:35 PM [name] D wrote: Patient stated the pharmacy wouldn't fill the prescription due to there being a later date for it to be filled. He is out and it was last filled September 20, 2023. Call patient back regarding this matter.

## 2023-10-28 NOTE — Telephone Encounter (Signed)
 Requested medication (s) are due for refill today: yes  Requested medication (s) are on the active medication list: yes  Last refill:  09/28/23 #60   Future visit scheduled: no  Notes to clinic:  pharmacy requesting 90 day refill   Requested Prescriptions  Pending Prescriptions Disp Refills   dicyclomine (BENTYL) 20 MG tablet [Pharmacy Med Name: DICYCLOMINE 20 MG TABLET] 360 tablet 1    Sig: TAKE 1 TABLET (20 MG TOTAL) BY MOUTH 4 (FOUR) TIMES DAILY - BEFORE MEALS AND AT BEDTIME.     Gastroenterology:  Antispasmodic Agents Passed - 10/28/2023 12:52 PM      Passed - Valid encounter within last 12 months    Recent Outpatient Visits           2 months ago Gastroenteritis   [facility] [place], Netta Neat, DO   4 months ago Type 2 diabetes mellitus with vascular disease [facility])   [place] [facility] Smitty Cords, DO   1 year ago COPD with acute exacerbation [facility])   [place] [facility] [place], Netta Neat, DO   1 year ago Cellulitis of left forearm   [place] [facility]um, Oswaldo Conroy, PA-C   1 year ago Acute pain of left knee   [place] [facility] [place], Netta Neat, DO              Refused Prescriptions Disp Refills   gabapentin (NEURONTIN) 400 MG capsule 240 capsule 5    Sig: Take 2 capsules (800 mg total) by mouth 4 (four) times daily.     Neurology: Anticonvulsants - gabapentin Passed - 10/28/2023 12:52 PM      Passed - Cr in normal range and within 360 days    Creat  Date Value Ref Range Status  08/07/2021 0.84 0.70 - 1.30 mg/dL Final   Creatinine, Ser  Date Value Ref Range Status  07/12/2023 0.68 0.61 - 1.24 mg/dL Final         Passed - Completed PHQ-2 or PHQ-9 in the last 360 days      Passed - Valid encounter within last 12 months    Recent Outpatient Visits           2 months ago Gastroenteritis   [place] [facility] [place], Netta Neat, DO   4 months ago Type 2 diabetes mellitus with vascular disease [facility])   [facility] Smitty Cords, DO   1 year ago COPD with acute exacerbation [facility])   [place] [facility] Smitty Cords, DO   1 year ago Cellulitis of left forearm    [facility]ecum, Oswaldo Conroy, [place]   1 year ago Acute pain of left knee   [facility] [place], Netta Neat, [place]

## 2023-10-28 NOTE — Telephone Encounter (Signed)
 Called pharmacy s/w Reita May. They have rx and are processing it now. They do not have enough to completely fill today, so they will give a short supply and give the rest tomorrow.

## 2023-10-28 NOTE — Telephone Encounter (Signed)
 Requested Prescriptions  Pending Prescriptions Disp Refills   dicyclomine (BENTYL) 20 MG tablet [Pharmacy Med Name: DICYCLOMINE 20 MG TABLET] 360 tablet 1    Sig: TAKE 1 TABLET (20 MG TOTAL) BY MOUTH 4 (FOUR) TIMES DAILY - BEFORE MEALS AND AT BEDTIME.     Gastroenterology:  Antispasmodic Agents Passed - 10/28/2023 12:07 PM      Passed - Valid encounter within last 12 months    Recent Outpatient Visits           2 months ago Gastroenteritis   [place] [facility] [place], Netta Neat, DO   4 months ago Type 2 diabetes mellitus with vascular disease [facility])   [place] [facility] Smitty Cords, DO   1 year ago COPD with acute exacerbation [facility])   [place] [place], Netta Neat, DO   1 year ago Cellulitis of left forearm   [place] [facility]um, Oswaldo Conroy, PA-C   1 year ago Acute pain of left knee   [place] [facility] [place], Netta Neat, DO              Refused Prescriptions Disp Refills   gabapentin (NEURONTIN) 400 MG capsule 240 capsule 5    Sig: Take 2 capsules (800 mg total) by mouth 4 (four) times daily.     Neurology: Anticonvulsants - gabapentin Passed - 10/28/2023 12:07 PM      Passed - Cr in normal range and within 360 days    Creat  Date Value Ref Range Status  08/07/2021 0.84 0.70 - 1.30 mg/dL Final   Creatinine, Ser  Date Value Ref Range Status  07/12/2023 0.68 0.61 - 1.24 mg/dL Final         Passed - Completed PHQ-2 or PHQ-9 in the last 360 days      Passed - Valid encounter within last 12 months    Recent Outpatient Visits           2 months ago Gastroenteritis   [place] [facility] [place], Netta Neat, DO   4 months ago Type 2 diabetes mellitus with vascular disease [facility])   [place] [facility] Smitty Cords, DO   1 year ago COPD with acute exacerbation [facility])   [facility] Smitty Cords, DO   1 year ago Cellulitis of left forearm   [place] [facility] Mecum, Oswaldo Conroy, [place]   1 year ago Acute pain of left knee   [facility] [place], Netta Neat, [place]

## 2023-10-28 NOTE — Telephone Encounter (Signed)
 Requested medication (s) are due for refill today: yes  Requested medication (s) are on the active medication list: yes  Last refill:  10/15/23 #60  Future visit scheduled: no  Notes to clinic:  90 day refill requested   Requested Prescriptions  Pending Prescriptions Disp Refills   dicyclomine (BENTYL) 20 MG tablet [Pharmacy Med Name: DICYCLOMINE 20 MG TABLET] 360 tablet 1    Sig: TAKE 1 TABLET (20 MG TOTAL) BY MOUTH 4 (FOUR) TIMES DAILY - BEFORE MEALS AND AT BEDTIME.     Gastroenterology:  Antispasmodic Agents Passed - 10/28/2023 12:01 PM      Passed - Valid encounter within last 12 months    Recent Outpatient Visits           2 months ago Gastroenteritis   [place] [facility] [place], Netta Neat, DO   4 months ago Type 2 diabetes mellitus with vascular disease [facility])   [place] [facility] Smitty Cords, DO   1 year ago COPD with acute exacerbation [facility])   [place] [facility] [place], Netta Neat, DO   1 year ago Cellulitis of left forearm   [place] [facility] Mecum, Oswaldo Conroy, PA-C   1 year ago Acute pain of left knee   [place] [facility], Netta Neat, DO               gabapentin (NEURONTIN) 400 MG capsule 240 capsule 5    Sig: Take 2 capsules (800 mg total) by mouth 4 (four) times daily.     Neurology: Anticonvulsants - gabapentin Passed - 10/28/2023 12:01 PM      Passed - Cr in normal range and within 360 days    Creat  Date Value Ref Range Status  08/07/2021 0.84 0.70 - 1.30 mg/dL Final   Creatinine, Ser  Date Value Ref Range Status  07/12/2023 0.68 0.61 - 1.24 mg/dL Final         Passed - Completed PHQ-2 or PHQ-9 in the last 360 days      Passed - Valid encounter within last 12 months    Recent Outpatient Visits           2 months ago Gastroenteritis   [place] [facility] [place], Netta Neat, DO   4  months ago Type 2 diabetes mellitus with vascular disease [facility])   [place] [facility] Smitty Cords, DO   1 year ago COPD with acute exacerbation [facility])   [place] [facility] Smitty Cords, DO   1 year ago Cellulitis of left forearm   [place] [facility] Mecum, Oswaldo Conroy, [place]   1 year ago Acute pain of left knee   [facility] [place], Netta Neat, [place]

## 2023-10-28 NOTE — Telephone Encounter (Signed)
 Second request for this medication today. See previous

## 2023-10-31 DIAGNOSIS — L570 Actinic keratosis: Secondary | ICD-10-CM | POA: Diagnosis not present

## 2023-10-31 DIAGNOSIS — L720 Epidermal cyst: Secondary | ICD-10-CM | POA: Diagnosis not present

## 2023-10-31 DIAGNOSIS — D229 Melanocytic nevi, unspecified: Secondary | ICD-10-CM | POA: Diagnosis not present

## 2023-11-04 ENCOUNTER — Telehealth: Payer: Self-pay

## 2023-11-04 DIAGNOSIS — Z87820 Personal history of traumatic brain injury: Secondary | ICD-10-CM

## 2023-11-04 DIAGNOSIS — G8929 Other chronic pain: Secondary | ICD-10-CM

## 2023-11-04 DIAGNOSIS — M5030 Other cervical disc degeneration, unspecified cervical region: Secondary | ICD-10-CM

## 2023-11-04 NOTE — Telephone Encounter (Signed)
 Nikki, routing this chart back to you. It looks like his previous referral to [facility] Neurology was 05/2023. I don't see that it was scheduled or cancelled or changed. But he said that he was unable to go before. Do you need a new re order for the referral or can it be re-submitted with current order?  He also says needs TBI evaluated. 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  Saralyn Pilar, DO [facility] 11/04/2023, 5:18 PM

## 2023-11-04 NOTE — Telephone Encounter (Signed)
 Copied from CRM [id number]. Topic: Referral - Question >> Nov 04, 2023  4:00 PM Martha Clan wrote: Reason for CRM: Patient is requesting reactivation of the Neurology referral for hit TBI.

## 2023-11-05 NOTE — Telephone Encounter (Signed)
 New referral to [facility] Neurology placed  Saralyn Pilar, DO [facility] 11/05/2023, 12:55 PM

## 2023-11-05 NOTE — Addendum Note (Signed)
 Addended by: Smitty Cords on: 11/05/2023 12:55 PM   Modules accepted: Orders

## 2023-11-07 ENCOUNTER — Ambulatory Visit (INDEPENDENT_AMBULATORY_CARE_PROVIDER_SITE_OTHER): Admitting: Family Medicine

## 2023-11-07 ENCOUNTER — Other Ambulatory Visit: Payer: Self-pay | Admitting: Family Medicine

## 2023-11-07 ENCOUNTER — Encounter: Payer: Self-pay | Admitting: Family Medicine

## 2023-11-07 VITALS — BP 122/80 | HR 87 | Ht 72.0 in | Wt 180.0 lb

## 2023-11-07 DIAGNOSIS — M25511 Pain in right shoulder: Secondary | ICD-10-CM | POA: Diagnosis not present

## 2023-11-07 DIAGNOSIS — M1712 Unilateral primary osteoarthritis, left knee: Secondary | ICD-10-CM

## 2023-11-07 DIAGNOSIS — M5030 Other cervical disc degeneration, unspecified cervical region: Secondary | ICD-10-CM

## 2023-11-07 DIAGNOSIS — M25311 Other instability, right shoulder: Secondary | ICD-10-CM | POA: Diagnosis not present

## 2023-11-07 DIAGNOSIS — Z87820 Personal history of traumatic brain injury: Secondary | ICD-10-CM | POA: Diagnosis not present

## 2023-11-07 DIAGNOSIS — Z23 Encounter for immunization: Secondary | ICD-10-CM

## 2023-11-07 DIAGNOSIS — M25562 Pain in left knee: Secondary | ICD-10-CM

## 2023-11-07 DIAGNOSIS — G8929 Other chronic pain: Secondary | ICD-10-CM | POA: Diagnosis not present

## 2023-11-07 MED ORDER — METHYLPREDNISOLONE ACETATE 40 MG/ML IJ SUSP
40.0000 mg | Freq: Once | INTRAMUSCULAR | Status: AC
  Administered 2023-11-07: 40 mg via INTRA_ARTICULAR

## 2023-11-07 MED ORDER — LIDOCAINE HCL (PF) 1 % IJ SOLN
4.0000 mL | Freq: Once | INTRAMUSCULAR | Status: AC
  Administered 2023-11-07: 4 mL
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
## 2023-11-07 NOTE — Patient Instructions (Addendum)
 Thank you for coming to the office today.  R Shoulder Injection and Left KNee injection  Refilled Tramadol  DUE for FASTING BLOOD WORK (no food or drink after midnight before the lab appointment, only water or coffee without cream/sugar on the morning of)  SCHEDULE "Lab Only" visit in the morning at the clinic for lab draw in 2 months  - Make sure Lab Only appointment is at about 1 week before your next appointment, so that results will be available  For Lab Results, once available within 2-3 days of blood draw, you can can log in to MyChart online to view your results and a brief explanation. Also, we can discuss results at next follow-up visit.    Please schedule a Follow-up Appointment to: Return for Upcoming Annual fasting lab before.  If you have any other questions or concerns, please feel free to call the office or send a message through MyChart. You may also schedule an earlier appointment if necessary.  Additionally, you may be receiving a survey about your experience at our office within a few days to 1 week by e-mail or mail. We value your feedback.  Saralyn Pilar, DO [facility] [place], [place]

## 2023-11-08 DIAGNOSIS — Z23 Encounter for immunization: Secondary | ICD-10-CM | POA: Diagnosis not present

## 2023-11-08 DIAGNOSIS — M25562 Pain in left knee: Secondary | ICD-10-CM | POA: Diagnosis not present

## 2023-11-08 DIAGNOSIS — M25311 Other instability, right shoulder: Secondary | ICD-10-CM | POA: Diagnosis not present

## 2023-11-21 DIAGNOSIS — L720 Epidermal cyst: Secondary | ICD-10-CM | POA: Diagnosis not present

## 2023-11-21 DIAGNOSIS — D499 Neoplasm of unspecified behavior of unspecified site: Secondary | ICD-10-CM | POA: Diagnosis not present

## 2023-11-22 ENCOUNTER — Telehealth: Payer: Self-pay

## 2023-11-22 NOTE — Telephone Encounter (Signed)
 Copied from CRM [id number]. Topic: Referral - Status >> Nov 22, 2023 12:57 PM [name] D wrote: Patient says [facility] hasn't been contacted [facility]

## 2023-12-04 IMAGING — US US EXTREM LOW VENOUS*L*
1 series · 15 of 24 positions shown · non-contrast
Comparison: None.

CLINICAL DATA: Left lower extremity pain and swelling.

EXAM:
LEFT LOWER EXTREMITY VENOUS DOPPLER ULTRASOUND
TECHNIQUE: Gray-scale sonography with compression, as well as color and duplex
ultrasound, were performed to evaluate the deep venous system(s)
from the level of the common femoral vein through the popliteal and
proximal calf veins.

[Series 1: us venous imag bi/left/(id) · portal-venous · 15 of 32 slices shown]
[im 1/32]
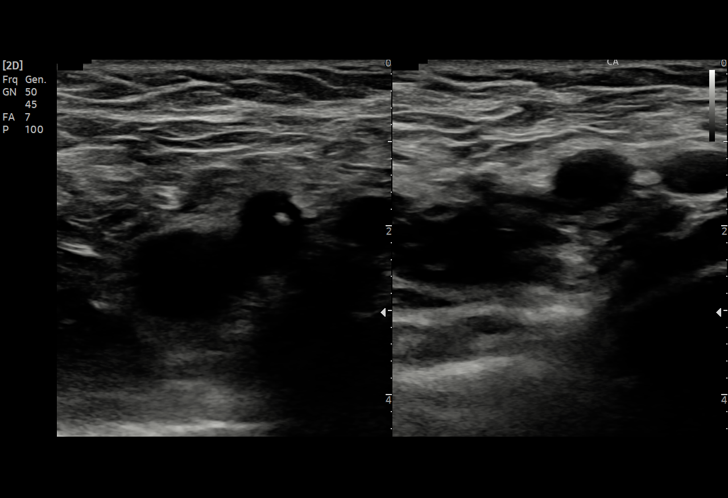
[im 3/32]
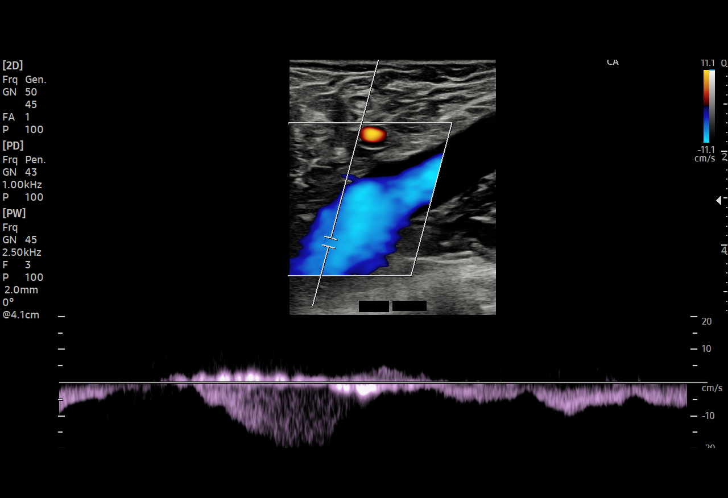
[im 6/32]
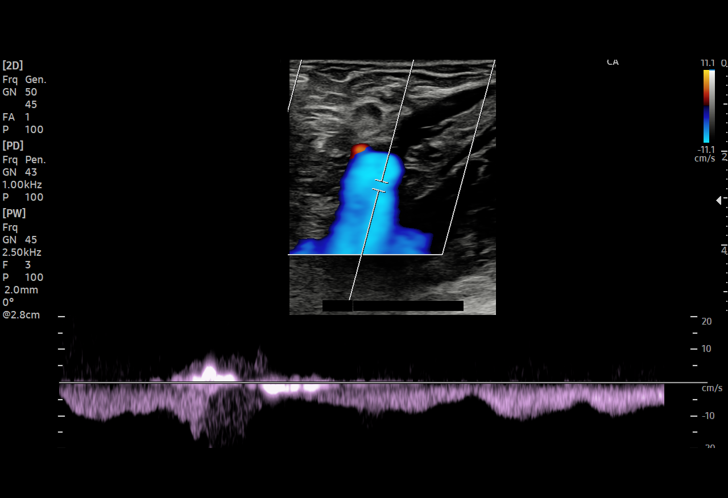
[im 7/32]
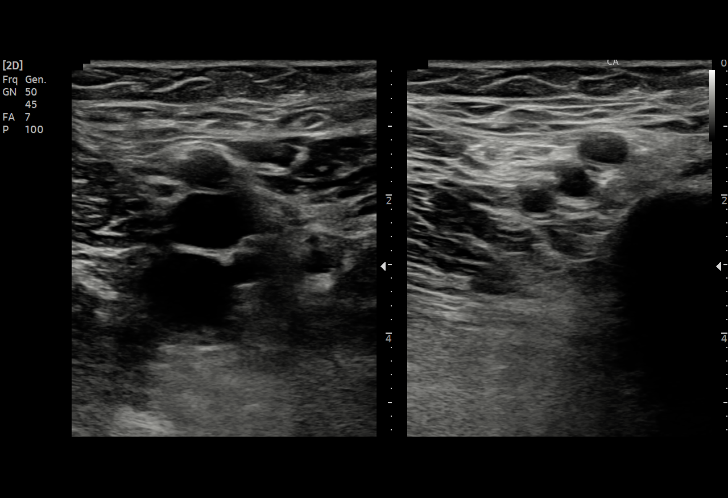
[im 10/32]
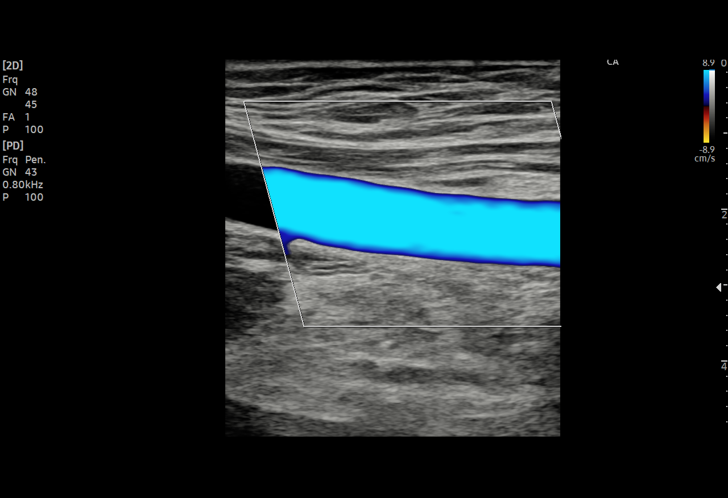
[im 11/32]
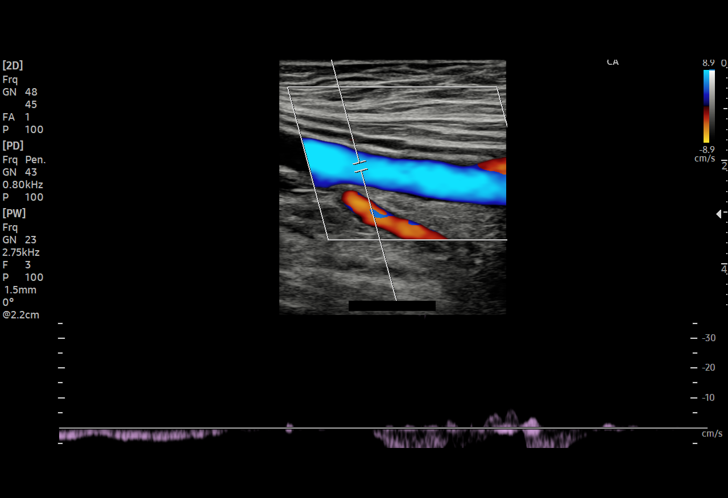
[im 14/32]
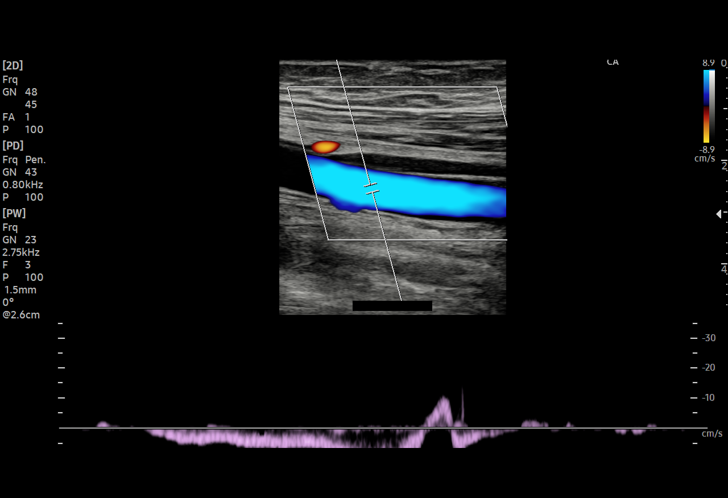
[im 17/32]
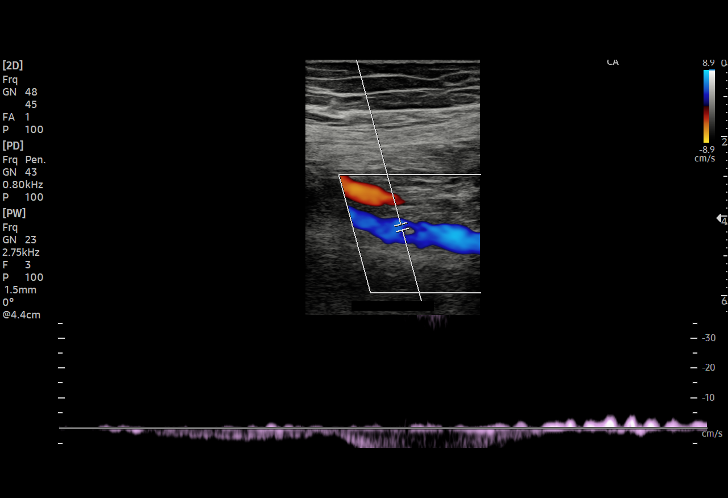
[im 18/32]
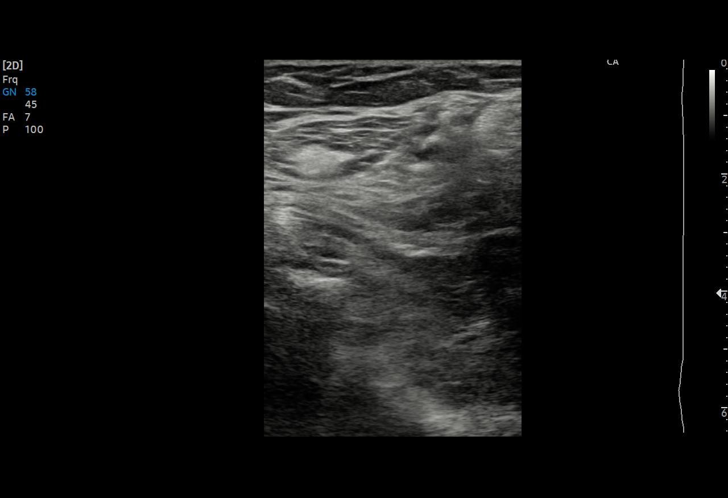
[im 21/32]
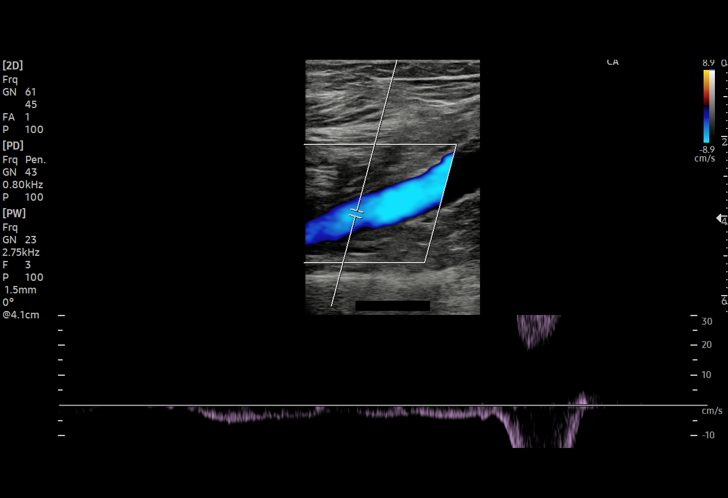
[im 22/32]
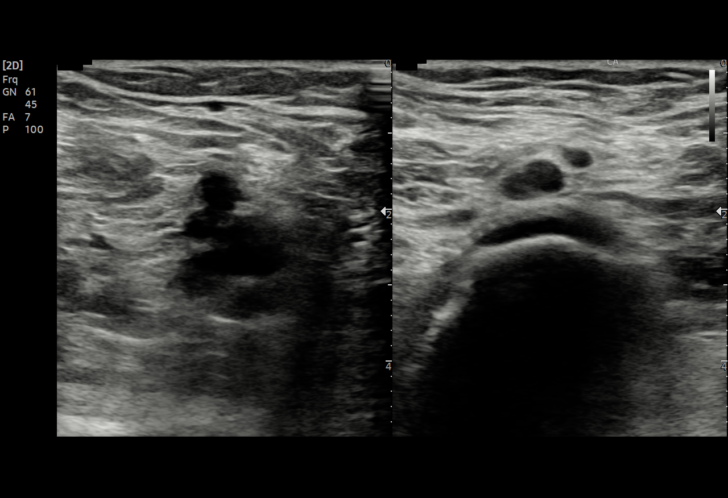
[im 25/32]
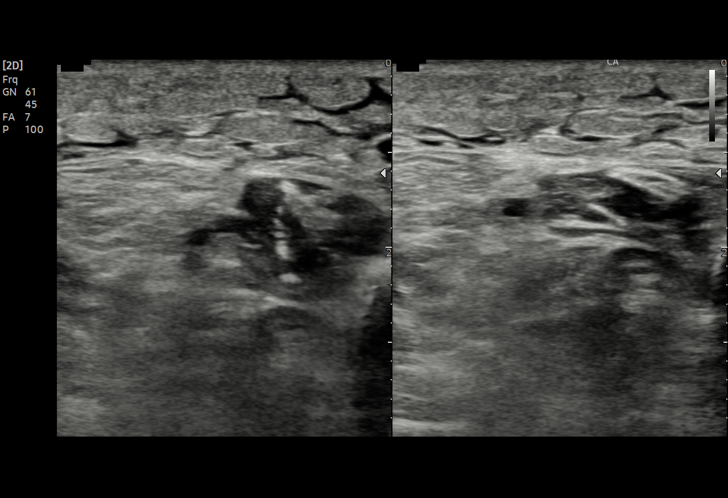
[im 27/32]
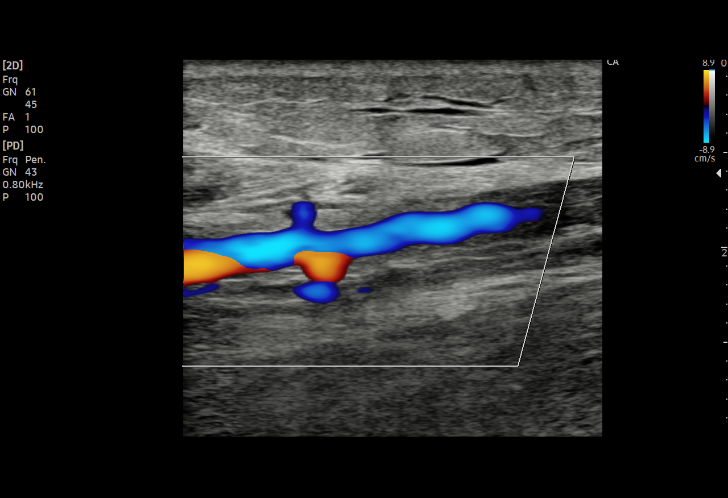
[im 29/32]
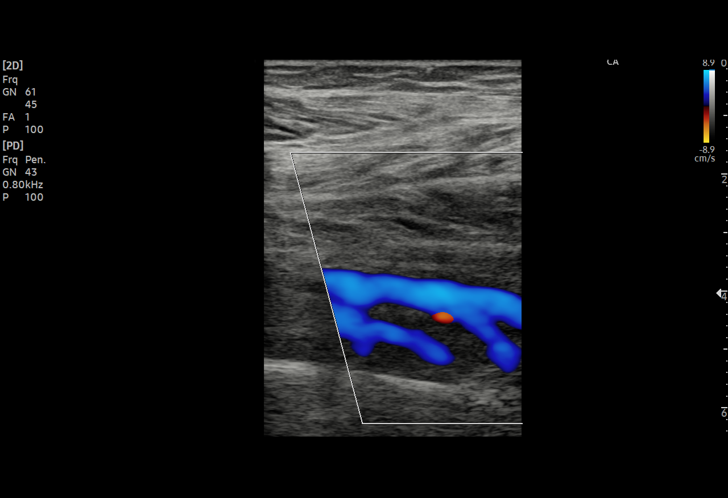
[im 32/32]
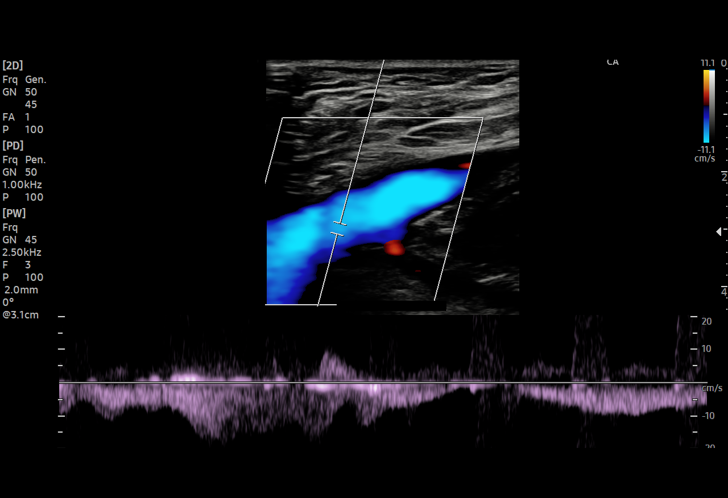

[15 of 24 positions shown; findings below may reference images not displayed]

FINDINGS: VENOUS

Normal compressibility of the common femoral, superficial femoral,
and popliteal veins, as well as the visualized calf veins.
Visualized portions of profunda femoral vein and great saphenous
vein unremarkable. No filling defects to suggest DVT on grayscale or
color Doppler imaging. Doppler waveforms show normal direction of
venous flow, normal respiratory plasticity and response to
augmentation.

Limited views of the contralateral common femoral vein are
unremarkable.

OTHER

None.

Limitations: none
IMPRESSION: Negative.

## 2023-12-06 DIAGNOSIS — L0211 Cutaneous abscess of neck: Secondary | ICD-10-CM | POA: Diagnosis not present

## 2023-12-10 ENCOUNTER — Other Ambulatory Visit: Payer: Self-pay | Admitting: Family Medicine

## 2023-12-10 DIAGNOSIS — Z79891 Long term (current) use of opiate analgesic: Secondary | ICD-10-CM | POA: Diagnosis not present

## 2023-12-10 DIAGNOSIS — Z860101 Personal history of adenomatous and serrated colon polyps: Secondary | ICD-10-CM | POA: Diagnosis not present

## 2023-12-10 DIAGNOSIS — R1084 Generalized abdominal pain: Secondary | ICD-10-CM | POA: Diagnosis not present

## 2023-12-10 DIAGNOSIS — K2980 Duodenitis without bleeding: Secondary | ICD-10-CM | POA: Diagnosis not present

## 2023-12-10 DIAGNOSIS — E1142 Type 2 diabetes mellitus with diabetic polyneuropathy: Secondary | ICD-10-CM

## 2023-12-10 DIAGNOSIS — R634 Abnormal weight loss: Secondary | ICD-10-CM | POA: Diagnosis not present

## 2023-12-10 DIAGNOSIS — R112 Nausea with vomiting, unspecified: Secondary | ICD-10-CM | POA: Diagnosis not present

## 2023-12-10 DIAGNOSIS — R7989 Other specified abnormal findings of blood chemistry: Secondary | ICD-10-CM | POA: Diagnosis not present

## 2023-12-10 DIAGNOSIS — Z7902 Long term (current) use of antithrombotics/antiplatelets: Secondary | ICD-10-CM | POA: Diagnosis not present

## 2023-12-10 DIAGNOSIS — K3189 Other diseases of stomach and duodenum: Secondary | ICD-10-CM | POA: Diagnosis not present

## 2023-12-11 NOTE — Telephone Encounter (Signed)
 No longer current dosing of this medication  Requested Prescriptions  Pending Prescriptions Disp Refills   gabapentin (NEURONTIN) 800 MG tablet [Pharmacy Med Name: GABAPENTIN 800 MG TABLET] 360 tablet 3    Sig: TAKE 1 TABLET BY MOUTH FOUR TIMES A DAY     There is no refill protocol information for this order

## 2023-12-17 DIAGNOSIS — E1159 Type 2 diabetes mellitus with other circulatory complications: Secondary | ICD-10-CM | POA: Diagnosis not present

## 2023-12-17 DIAGNOSIS — M5412 Radiculopathy, cervical region: Secondary | ICD-10-CM | POA: Diagnosis not present

## 2023-12-17 DIAGNOSIS — Z87828 Personal history of other (healed) physical injury and trauma: Secondary | ICD-10-CM | POA: Diagnosis not present

## 2023-12-17 DIAGNOSIS — Z87820 Personal history of traumatic brain injury: Secondary | ICD-10-CM | POA: Diagnosis not present

## 2023-12-17 DIAGNOSIS — Z8673 Personal history of transient ischemic attack (TIA), and cerebral infarction without residual deficits: Secondary | ICD-10-CM | POA: Diagnosis not present

## 2023-12-17 DIAGNOSIS — M5030 Other cervical disc degeneration, unspecified cervical region: Secondary | ICD-10-CM | POA: Diagnosis not present

## 2023-12-20 ENCOUNTER — Other Ambulatory Visit: Payer: Self-pay | Admitting: Family Medicine

## 2023-12-20 DIAGNOSIS — M5030 Other cervical disc degeneration, unspecified cervical region: Secondary | ICD-10-CM

## 2023-12-20 DIAGNOSIS — M1712 Unilateral primary osteoarthritis, left knee: Secondary | ICD-10-CM

## 2023-12-20 DIAGNOSIS — M25311 Other instability, right shoulder: Secondary | ICD-10-CM

## 2023-12-20 NOTE — Telephone Encounter (Signed)
 Requested medication (s) are due for refill today: review  Requested medication (s) are on the active medication list: yes  Last refill:  11/07/23 #42/2  Future visit scheduled: yes  Notes to clinic:  Unable to refill/refuse per protocol, cannot delegate.      Requested Prescriptions  Pending Prescriptions Disp Refills   traMADol  (ULTRAM ) 50 MG tablet [Pharmacy Med Name: TRAMADOL  HCL 50 MG TABLET] 42 tablet     Sig: TAKE 1 TABLET (50 MG TOTAL) BY MOUTH EVERY 4 (FOUR) HOURS AS NEEDED FOR MODERATE PAIN (PAIN SCORE 4-6). FOR 7 DAYS     There is no refill protocol information for this order

## 2023-12-26 ENCOUNTER — Encounter: Payer: Self-pay | Admitting: Gastroenterology

## 2023-12-30 ENCOUNTER — Other Ambulatory Visit: Payer: Self-pay | Admitting: Family Medicine

## 2023-12-30 DIAGNOSIS — M1712 Unilateral primary osteoarthritis, left knee: Secondary | ICD-10-CM

## 2023-12-30 DIAGNOSIS — M5030 Other cervical disc degeneration, unspecified cervical region: Secondary | ICD-10-CM

## 2023-12-30 DIAGNOSIS — M25311 Other instability, right shoulder: Secondary | ICD-10-CM

## 2023-12-31 ENCOUNTER — Other Ambulatory Visit: Payer: Self-pay | Admitting: Family Medicine

## 2023-12-31 DIAGNOSIS — M1712 Unilateral primary osteoarthritis, left knee: Secondary | ICD-10-CM

## 2023-12-31 DIAGNOSIS — M25311 Other instability, right shoulder: Secondary | ICD-10-CM

## 2023-12-31 DIAGNOSIS — M5030 Other cervical disc degeneration, unspecified cervical region: Secondary | ICD-10-CM

## 2023-12-31 NOTE — Telephone Encounter (Signed)
 Copied from CRM [id number]. Topic: Clinical - Medication Refill >> Dec 31, 2023 10:44 AM Phil Braun wrote: Most Recent Primary Care Visit:  Provider: Raina Bunting  Department: [facility]  Visit Type: OFFICE VISIT  Date: 11/07/2023  Medication: traMADol  (ULTRAM ) 50 MG tablet   Has the patient contacted their pharmacy? Yes   Is this the correct pharmacy for this prescription? Yes  This is the patient's preferred pharmacy:   [id number] - [place], [place] - [address] [place] [address] Phone: [phone] Fax: [phone]    Has the prescription been filled recently? Yes  Is the patient out of the medication? Yes  Has the patient been seen for an appointment in the last year OR does the patient have an upcoming appointment? Yes  Can we respond through MyChart? No  Agent: Please be advised that Rx refills may take up to 3 business days. We ask that you follow-up with your pharmacy.

## 2024-01-01 ENCOUNTER — Telehealth: Payer: Self-pay

## 2024-01-01 NOTE — Telephone Encounter (Signed)
 Rx request has already been sent back. Waiting on provider to respond.

## 2024-01-01 NOTE — Telephone Encounter (Signed)
 Copied from CRM [id number]. Topic: Clinical - Medication Refill >> Dec 31, 2023 10:44 AM Phil Braun wrote: Most Recent Primary Care Visit:  Provider: Raina Bunting  Department: [facility]  Visit Type: OFFICE VISIT  Date: 11/07/2023  Medication: traMADol  (ULTRAM ) 50 MG tablet   Has the patient contacted their pharmacy? Yes   Is this the correct pharmacy for this prescription? Yes  This is the patient's preferred pharmacy:   [id number] - [place], [place] - [address] [place] [address] Phone: [phone] Fax: [phone]    Has the prescription been filled recently? Yes  Is the patient out of the medication? Yes  Has the patient been seen for an appointment in the last year OR does the patient have an upcoming appointment? Yes  Can we respond through MyChart? No  Agent: Please be advised that Rx refills may take up to 3 business days. We ask that you follow-up with your pharmacy.

## 2024-01-01 NOTE — Telephone Encounter (Signed)
 Requested medication (s) are due for refill today: yes  Requested medication (s) are on the active medication list: yes  Last refill:  12/20/23 #42  Future visit scheduled: yes  Notes to clinic:  med not delegated to NT to RF   Requested Prescriptions  Pending Prescriptions Disp Refills   traMADol  (ULTRAM ) 50 MG tablet [Pharmacy Med Name: TRAMADOL  HCL 50 MG TABLET] 42 tablet 0    Sig: TAKE 1 TABLET (50 MG TOTAL) BY MOUTH EVERY 4 (FOUR) HOURS AS NEEDED FOR MODERATE PAIN (PAIN SCORE 4-6). FOR 7 DAYS     Not Delegated - Analgesics:  Opioid Agonists Failed - 01/01/2024  8:19 AM      Failed - This refill cannot be delegated      Passed - Urine Drug Screen completed in last 360 days      Passed - Valid encounter within last 3 months    Recent Outpatient Visits           1 month ago Chronic pain of left knee   [place] [facility] [place], Kayleen Party, DO   2 months ago Unintentional weight loss   [facility] [place], Kayleen Party, [place]

## 2024-01-02 NOTE — Telephone Encounter (Signed)
 Duplicate request, refilled 01/01/24.  Requested Prescriptions  Pending Prescriptions Disp Refills   traMADol  (ULTRAM ) 50 MG tablet 42 tablet 0    Sig: Take 1 tablet (50 mg total) by mouth every 4 (four) hours as needed for moderate pain (pain score 4-6). For 7 days     Not Delegated - Analgesics:  Opioid Agonists Failed - 01/02/2024  9:45 AM      Failed - This refill cannot be delegated      Passed - Urine Drug Screen completed in last 360 days      Passed - Valid encounter within last 3 months    Recent Outpatient Visits           1 month ago Chronic pain of left knee   [place] [facility] [place], Kayleen Party, DO   2 months ago Unintentional weight loss   [facility] [place], Kayleen Party, [place]

## 2024-01-03 ENCOUNTER — Ambulatory Visit: Admitting: Anesthesiology

## 2024-01-03 ENCOUNTER — Other Ambulatory Visit: Payer: Self-pay

## 2024-01-03 ENCOUNTER — Ambulatory Visit
Admission: RE | Admit: 2024-01-03 | Discharge: 2024-01-03 | Disposition: A | Discharge status: Home / Self Care | Attending: Gastroenterology | Admitting: Gastroenterology

## 2024-01-03 ENCOUNTER — Encounter
Admission: RE | Disposition: A | Discharge status: Home / Self Care | Payer: Self-pay | Source: Home / Self Care | Attending: Gastroenterology

## 2024-01-03 ENCOUNTER — Encounter: Payer: Self-pay | Admitting: Gastroenterology

## 2024-01-03 DIAGNOSIS — K635 Polyp of colon: Secondary | ICD-10-CM | POA: Diagnosis not present

## 2024-01-03 DIAGNOSIS — I252 Old myocardial infarction: Secondary | ICD-10-CM | POA: Diagnosis not present

## 2024-01-03 DIAGNOSIS — Z8673 Personal history of transient ischemic attack (TIA), and cerebral infarction without residual deficits: Secondary | ICD-10-CM | POA: Diagnosis not present

## 2024-01-03 DIAGNOSIS — K3189 Other diseases of stomach and duodenum: Secondary | ICD-10-CM | POA: Diagnosis not present

## 2024-01-03 DIAGNOSIS — F17210 Nicotine dependence, cigarettes, uncomplicated: Secondary | ICD-10-CM | POA: Insufficient documentation

## 2024-01-03 DIAGNOSIS — K5730 Diverticulosis of large intestine without perforation or abscess without bleeding: Secondary | ICD-10-CM | POA: Insufficient documentation

## 2024-01-03 DIAGNOSIS — K209 Esophagitis, unspecified without bleeding: Secondary | ICD-10-CM | POA: Diagnosis not present

## 2024-01-03 DIAGNOSIS — Z7902 Long term (current) use of antithrombotics/antiplatelets: Secondary | ICD-10-CM | POA: Diagnosis not present

## 2024-01-03 DIAGNOSIS — K2980 Duodenitis without bleeding: Secondary | ICD-10-CM | POA: Diagnosis not present

## 2024-01-03 DIAGNOSIS — Z800 Family history of malignant neoplasm of digestive organs: Secondary | ICD-10-CM | POA: Diagnosis not present

## 2024-01-03 DIAGNOSIS — D123 Benign neoplasm of transverse colon: Secondary | ICD-10-CM | POA: Diagnosis not present

## 2024-01-03 DIAGNOSIS — F419 Anxiety disorder, unspecified: Secondary | ICD-10-CM | POA: Diagnosis not present

## 2024-01-03 DIAGNOSIS — K5790 Diverticulosis of intestine, part unspecified, without perforation or abscess without bleeding: Secondary | ICD-10-CM | POA: Diagnosis not present

## 2024-01-03 DIAGNOSIS — K2970 Gastritis, unspecified, without bleeding: Secondary | ICD-10-CM | POA: Diagnosis not present

## 2024-01-03 DIAGNOSIS — K259 Gastric ulcer, unspecified as acute or chronic, without hemorrhage or perforation: Secondary | ICD-10-CM | POA: Diagnosis not present

## 2024-01-03 DIAGNOSIS — K269 Duodenal ulcer, unspecified as acute or chronic, without hemorrhage or perforation: Secondary | ICD-10-CM | POA: Insufficient documentation

## 2024-01-03 DIAGNOSIS — D124 Benign neoplasm of descending colon: Secondary | ICD-10-CM | POA: Insufficient documentation

## 2024-01-03 DIAGNOSIS — I10 Essential (primary) hypertension: Secondary | ICD-10-CM | POA: Diagnosis not present

## 2024-01-03 DIAGNOSIS — K2990 Gastroduodenitis, unspecified, without bleeding: Secondary | ICD-10-CM | POA: Diagnosis not present

## 2024-01-03 DIAGNOSIS — D122 Benign neoplasm of ascending colon: Secondary | ICD-10-CM | POA: Insufficient documentation

## 2024-01-03 DIAGNOSIS — I2510 Atherosclerotic heart disease of native coronary artery without angina pectoris: Secondary | ICD-10-CM | POA: Diagnosis not present

## 2024-01-03 DIAGNOSIS — F32A Depression, unspecified: Secondary | ICD-10-CM | POA: Insufficient documentation

## 2024-01-03 DIAGNOSIS — F909 Attention-deficit hyperactivity disorder, unspecified type: Secondary | ICD-10-CM | POA: Diagnosis not present

## 2024-01-03 DIAGNOSIS — Z1211 Encounter for screening for malignant neoplasm of colon: Secondary | ICD-10-CM | POA: Diagnosis not present

## 2024-01-03 DIAGNOSIS — K319 Disease of stomach and duodenum, unspecified: Secondary | ICD-10-CM | POA: Diagnosis not present

## 2024-01-03 DIAGNOSIS — E119 Type 2 diabetes mellitus without complications: Secondary | ICD-10-CM | POA: Insufficient documentation

## 2024-01-03 HISTORY — DX: Unspecified injury of head, initial encounter: S09.90XA

## 2024-01-03 HISTORY — DX: Major depressive disorder, recurrent, unspecified: F33.9

## 2024-01-03 HISTORY — DX: Elevated white blood cell count, unspecified: D72.829

## 2024-01-03 HISTORY — PX: ESOPHAGOGASTRODUODENOSCOPY: SHX5428

## 2024-01-03 HISTORY — DX: Type 2 diabetes mellitus without complications: E11.9

## 2024-01-03 HISTORY — DX: Laceration without foreign body of left wrist, initial encounter: S61.512A

## 2024-01-03 HISTORY — PX: COLONOSCOPY: SHX5424

## 2024-01-03 HISTORY — DX: Anemia, unspecified: D64.9

## 2024-01-03 LAB — GLUCOSE, CAPILLARY: Glucose-Capillary: 113 mg/dL — ABNORMAL HIGH (ref 70–99)

## 2024-01-03 SURGERY — COLONOSCOPY
Anesthesia: General

## 2024-01-03 MED ORDER — PHENYLEPHRINE 80 MCG/ML (10ML) SYRINGE FOR IV PUSH (FOR BLOOD PRESSURE SUPPORT)
PREFILLED_SYRINGE | INTRAVENOUS | Status: DC | PRN
  Administered 2024-01-03: 160 ug via INTRAVENOUS

## 2024-01-03 MED ORDER — SODIUM CHLORIDE 0.9 % IV SOLN: INTRAVENOUS | Status: DC

## 2024-01-03 MED ORDER — DEXMEDETOMIDINE HCL IN NACL 80 MCG/20ML IV SOLN
INTRAVENOUS | Status: DC | PRN
Start: 2024-01-03 — End: 2024-01-03
  Administered 2024-01-03: 20 ug via INTRAVENOUS

## 2024-01-03 MED ORDER — GLYCOPYRROLATE 0.2 MG/ML IJ SOLN
INTRAMUSCULAR | Status: DC | PRN
  Administered 2024-01-03: .2 mg via INTRAVENOUS

## 2024-01-03 MED ORDER — LIDOCAINE HCL (CARDIAC) PF 100 MG/5ML IV SOSY
PREFILLED_SYRINGE | INTRAVENOUS | Status: DC | PRN
  Administered 2024-01-03: 80 mg via INTRAVENOUS

## 2024-01-03 MED ORDER — PROPOFOL 10 MG/ML IV BOLUS
INTRAVENOUS | Status: DC | PRN
  Administered 2024-01-03 (×2): 50 mg via INTRAVENOUS
  Administered 2024-01-03: 20 mg via INTRAVENOUS
  Administered 2024-01-03 (×2): 30 mg via INTRAVENOUS

## 2024-01-03 MED ORDER — PROPOFOL 500 MG/50ML IV EMUL
INTRAVENOUS | Status: DC | PRN
  Administered 2024-01-03: 75 ug/kg/min via INTRAVENOUS

## 2024-01-03 MED ORDER — LIDOCAINE HCL (PF) 2 % IJ SOLN
INTRAMUSCULAR | Status: AC
  Filled 2024-01-03: qty 5

## 2024-01-03 NOTE — Transfer of Care (Signed)
 Immediate Anesthesia Transfer of Care Note  Patient: Tyler Martinez  Procedure(s) Performed: COLONOSCOPY EGD (ESOPHAGOGASTRODUODENOSCOPY)  Patient Location: PACU  Anesthesia Type:General  Level of Consciousness: sedated  Airway & Oxygen Therapy: Patient Spontanous Breathing  Post-op Assessment: Report given to RN and Post -op Vital signs reviewed and stable  Post vital signs: Reviewed and stable  Last Vitals:  Vitals Value Taken Time  BP 87/65 01/03/24 1123  Temp 35.8 C 01/03/24 1122  Pulse 76 01/03/24 1123  Resp 21 01/03/24 1123  SpO2 92 % 01/03/24 1123  Vitals shown include unfiled device data.  Last Pain:  Vitals:   01/03/24 1122  TempSrc: Temporal  PainSc: Asleep         Complications: No notable events documented.
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
## 2024-01-03 NOTE — Interval H&P Note (Signed)
 History and Physical Interval Note: Preprocedure H&P from 01/03/24  was reviewed and there was no interval change after seeing and examining the patient.  Written consent was obtained from the patient after discussion of risks, benefits, and alternatives. Patient has consented to proceed with Esophagogastroduodenoscopy and Colonoscopy with possible intervention   01/03/2024 10:29 AM  Harmon Lights  has presented today for surgery, with the diagnosis of Unintentional weight loss (R63.4) Hx of adenomatous colonic polyps (Z86.0101) Nausea and vomiting, unspecified vomiting type (R11.2) Fecal urgency (R15.2) Generalized abdominal cramping (R10.84) Erosive gastropathy (K31.89) Duodenitis (K29.80) History of ETOH abuse (F10.11).  The various methods of treatment have been discussed with the patient and family. After consideration of risks, benefits and other options for treatment, the patient has consented to  Procedure(s): COLONOSCOPY (N/A) EGD (ESOPHAGOGASTRODUODENOSCOPY) (N/A) as a surgical intervention.  The patient's history has been reviewed, patient examined, no change in status, stable for surgery.  I have reviewed the patient's chart and labs.  Questions were answered to the patient's satisfaction.     Quintin Buckle

## 2024-01-03 NOTE — Op Note (Signed)
 [facility] G[facility] Patient Name: Tyler Martinez Procedure Date: 01/03/2024 10:18 AM MRN: [id number] Account #: [id number] Date of Birth: 30-Jan-1971 Admit Type: Outpatient Age: 53 Room: [facility] ENDO ROOM 1 Gender: Male Note Status: Supervisor Override Instrument Name: Upper Endoscope [phone] Procedure:             Upper GI endoscopy Indications:           Nausea with vomiting, Weight loss, erosive gastropathy Providers:             Quintin Buckle DO, DO Medicines:             Monitored Anesthesia Care Complications:         No immediate complications. Estimated blood loss:                         Minimal. Procedure:             Pre-Anesthesia Assessment:                        - Prior to the procedure, a History and Physical was                         performed, and patient medications and allergies were                         reviewed. The patient is competent. The risks and                         benefits of the procedure and the sedation options and                         risks were discussed with the patient. All questions                         were answered and informed consent was obtained.                         Patient identification and proposed procedure were                         verified by the physician, the nurse, the anesthetist                         and the technician in the endoscopy suite. Mental                         Status Examination: alert and oriented. Airway                         Examination: normal oropharyngeal airway and neck                         mobility. Respiratory Examination: clear to                         auscultation. CV Examination: RRR, no murmurs, no S3                         or S4.  Prophylactic Antibiotics: The patient does not                         require prophylactic antibiotics. Prior                         Anticoagulants: The patient has taken Plavix                          (clopidogrel ),  last dose was 3 weeks prior to                         procedure. ASA Grade Assessment: III - A patient with                         severe systemic disease. After reviewing the risks and                         benefits, the patient was deemed in satisfactory                         condition to undergo the procedure. The anesthesia                         plan was to use monitored anesthesia care (MAC).                         Immediately prior to administration of medications,                         the patient was re-assessed for adequacy to receive                         sedatives. The heart rate, respiratory rate, oxygen                         saturations, blood pressure, adequacy of pulmonary                         ventilation, and response to care were monitored                         throughout the procedure. The physical status of the                         patient was re-assessed after the procedure.                        After obtaining informed consent, the endoscope was                         passed under direct vision. Throughout the procedure,                         the patient's blood pressure, pulse, and oxygen                         saturations were monitored continuously. The Endoscope  was introduced through the mouth, and advanced to the                         third part of duodenum. The upper GI endoscopy was                         accomplished without difficulty. The patient tolerated                         the procedure well. Findings:      Two localized erosions without bleeding were found in the duodenal bulb.       Biopsies were taken with a cold forceps for histology. Estimated blood       loss was minimal.      The exam of the duodenum was otherwise normal.      Localized moderate inflammation characterized by erosions was found in       the gastric body and in the gastric antrum. Biopsies were taken with a       cold  forceps for histology. Estimated blood loss was minimal.      The exam of the stomach was otherwise normal.      Esophagogastric landmarks were identified: the gastroesophageal junction       was found at 40 cm from the incisors.      The Z-line was regular. Estimated blood loss: none.      The exam of the esophagus was otherwise normal. Impression:            - Duodenal erosions without bleeding. Biopsied.                        - Gastritis. Biopsied.                        - Esophagogastric landmarks identified.                        - Z-line regular. Recommendation:        - Patient has a contact number available for                         emergencies. The signs and symptoms of potential                         delayed complications were discussed with the patient.                         Return to normal activities tomorrow. Written                         discharge instructions were provided to the patient.                        - Discharge patient to home.                        - Resume previous diet.                        - Continue present medications.                        -  No ibuprofen , naproxen, or other non-steroidal                         anti-inflammatory drugs.                        - Take proton pump inhibitor twice a day for 8 weeks.                        - Await pathology results.                        - Return to GI clinic as previously scheduled.                        - proceed with colonoscopy. see report for further                         recommendations                        - The findings and recommendations were discussed with                         the patient. Procedure Code(s):     --- Professional ---                        [phone], Esophagogastroduodenoscopy, flexible,                         transoral; with biopsy, single or multiple Diagnosis Code(s):     --- Professional ---                        K26.9, Duodenal ulcer, unspecified as acute  or                         chronic, without hemorrhage or perforation                        K29.70, Gastritis, unspecified, without bleeding                        R11.2, Nausea with vomiting, unspecified                        R63.4, Abnormal weight loss CPT copyright 2022 American Medical Association. All rights reserved. The codes documented in this report are preliminary and upon coder review may  be revised to meet current compliance requirements. Attending Participation:      I personally performed the entire procedure. Polo Brisk, DO Quintin Buckle DO, DO 01/03/2024 10:52:13 AM This report has been signed electronically. Number of Addenda: 0 Note Initiated On: 01/03/2024 10:18 AM Estimated Blood Loss:  Estimated blood loss was minimal.      [facility]

## 2024-01-03 NOTE — Anesthesia Postprocedure Evaluation (Signed)
 Anesthesia Post Note  Patient: Tyler Martinez  Procedure(s) Performed: COLONOSCOPY EGD (ESOPHAGOGASTRODUODENOSCOPY)  Patient location during evaluation: PACU Anesthesia Type: General Level of consciousness: awake and alert, oriented and patient cooperative Pain management: pain level controlled Vital Signs Assessment: post-procedure vital signs reviewed and stable Respiratory status: spontaneous breathing, nonlabored ventilation and respiratory function stable Cardiovascular status: blood pressure returned to baseline and stable Postop Assessment: adequate PO intake Anesthetic complications: no   No notable events documented.   Last Vitals:  Vitals:   01/03/24 1137 01/03/24 1145  BP: 101/75 114/87  Pulse: 72 65  Resp: 18 (!) 23  Temp:    SpO2: 100% 99%    Last Pain:  Vitals:   01/03/24 1137  TempSrc:   PainSc: 0-No pain                 Dorothey Gate

## 2024-01-03 NOTE — Anesthesia Preprocedure Evaluation (Addendum)
 Anesthesia Evaluation  Patient identified by MRN, date of birth, ID band Patient awake    Reviewed: Allergy & Precautions, NPO status , Patient's Chart, lab work & pertinent test results  History of Anesthesia Complications Negative for: history of anesthetic complications  Airway Mallampati: I   Neck ROM: Full    Dental  (+) Edentulous Upper, Edentulous Lower   Pulmonary Current Smoker (1 ppd) and Patient abstained from smoking.   Pulmonary exam normal breath sounds clear to auscultation       Cardiovascular hypertension, + CAD (s/p MI and stents on Plavix )  Normal cardiovascular exam Rhythm:Regular Rate:Normal  ECG 04/18/23: NSR; nonspecific ST abnormality   Neuro/Psych  PSYCHIATRIC DISORDERS (ADHD) Anxiety Depression    Polysubstance use disorder including alcohol (quit 8 months ago), benzodiazepines, opioids, amphetamines -- pt states all were prescription medications; chronic pain CVA (residual left eye vision deficits)    GI/Hepatic negative GI ROS,,,  Endo/Other  diabetes, Type 2    Renal/GU negative Renal ROS     Musculoskeletal  (+) Arthritis , Rheumatoid disorders,    Abdominal   Peds  Hematology   Anesthesia Other Findings   Reproductive/Obstetrics                             Anesthesia Physical Anesthesia Plan  ASA: 3  Anesthesia Plan: General   Post-op Pain Management:    Induction: Intravenous  PONV Risk Score and Plan: 1 and Propofol  infusion, TIVA and Treatment may vary due to age or medical condition  Airway Management Planned: Natural Airway  Additional Equipment:   Intra-op Plan:   Post-operative Plan:   Informed Consent: I have reviewed the patients History and Physical, chart, labs and discussed the procedure including the risks, benefits and alternatives for the proposed anesthesia with the patient or authorized representative who has indicated his/her  understanding and acceptance.       Plan Discussed with: CRNA  Anesthesia Plan Comments: (LMA/GETA backup discussed.  Patient consented for risks of anesthesia including but not limited to:  - adverse reactions to medications - damage to eyes, teeth, lips or other oral mucosa - nerve damage due to positioning  - sore throat or hoarseness - damage to heart, brain, nerves, lungs, other parts of body or loss of life  Informed patient about role of CRNA in peri- and intra-operative care.  Patient voiced understanding.)        Anesthesia Quick Evaluation

## 2024-01-03 NOTE — Op Note (Signed)
 [facility] Gastroenterology Patient Name: Tyler Martinez Procedure Date: 01/03/2024 10:18 AM MRN: [id number] Account #: [id number] Date of Birth: August 18, 1971 Admit Type: Outpatient Age: 53 Room: [facility] ENDO ROOM 1 Gender: Male Note Status: Supervisor Override Instrument Name: Hyman Main [id number] Procedure:             Colonoscopy Indications:           High risk colon cancer surveillance: Personal history                         of colonic polyps, fecal urgency, general abdominal                         cramping Providers:             Quintin Buckle DO, DO Medicines:             Monitored Anesthesia Care Complications:         No immediate complications. Estimated blood loss:                         Minimal. Procedure:             Pre-Anesthesia Assessment:                        - Prior to the procedure, a History and Physical was                         performed, and patient medications and allergies were                         reviewed. The patient is competent. The risks and                         benefits of the procedure and the sedation options and                         risks were discussed with the patient. All questions                         were answered and informed consent was obtained.                         Patient identification and proposed procedure were                         verified by the physician, the nurse, the anesthetist                         and the technician in the endoscopy suite. Mental                         Status Examination: alert and oriented. Airway                         Examination: normal oropharyngeal airway and neck                         mobility. Respiratory Examination: clear to  auscultation. CV Examination: RRR, no murmurs, no S3                         or S4. Prophylactic Antibiotics: The patient does not                         require prophylactic antibiotics. Prior                          Anticoagulants: The patient has taken Plavix                          (clopidogrel ), last dose was 3 weeks prior to                         procedure. ASA Grade Assessment: III - A patient with                         severe systemic disease. After reviewing the risks and                         benefits, the patient was deemed in satisfactory                         condition to undergo the procedure. The anesthesia                         plan was to use monitored anesthesia care (MAC).                         Immediately prior to administration of medications,                         the patient was re-assessed for adequacy to receive                         sedatives. The heart rate, respiratory rate, oxygen                         saturations, blood pressure, adequacy of pulmonary                         ventilation, and response to care were monitored                         throughout the procedure. The physical status of the                         patient was re-assessed after the procedure.                        After obtaining informed consent, the colonoscope was                         passed under direct vision. Throughout the procedure,                         the patient's blood pressure, pulse, and oxygen  saturations were monitored continuously. The                         Colonoscope was introduced through the anus and                         advanced to the the terminal ileum, with                         identification of the appendiceal orifice and IC                         valve. The colonoscopy was somewhat difficult due to                         significant looping. Successful completion of the                         procedure was aided by straightening and shortening                         the scope to obtain bowel loop reduction, applying                         abdominal pressure and lavage. The patient tolerated                          the procedure well. The quality of the bowel                         preparation was evaluated using the BBPS [facility] Bowel                         Preparation Scale) with scores of: Right Colon = 2                         (minor amount of residual staining, small fragments of                         stool and/or opaque liquid, but mucosa seen well),                         Transverse Colon = 2 (minor amount of residual                         staining, small fragments of stool and/or opaque                         liquid, but mucosa seen well) and Left Colon = 2                         (minor amount of residual staining, small fragments of                         stool and/or opaque liquid, but mucosa seen well). The  total BBPS score equals 6. The quality of the bowel                         preparation was good. The terminal ileum, ileocecal                         valve, appendiceal orifice, and rectum were                         photographed. Findings:      The perianal and digital rectal examinations were normal. Pertinent       negatives include normal sphincter tone.      The terminal ileum appeared normal. Estimated blood loss: none.      Retroflexion in the right colon was performed.      Multiple small-mouthed diverticula were found in the entire colon.       Estimated blood loss: none.      Four sessile polyps were found in the descending colon (1), transverse       colon (1) and ascending colon (2). The polyps were 1 to 2 mm in size.       These polyps were removed with a jumbo cold forceps. Resection and       retrieval were complete. Estimated blood loss was minimal.      The exam was otherwise without abnormality on direct and retroflexion       views. Impression:            - The examined portion of the ileum was normal.                        - Diverticulosis in the entire examined colon.                        - Four 1 to 2 mm  polyps in the descending colon, in                         the transverse colon and in the ascending colon,                         removed with a jumbo cold forceps. Resected and                         retrieved.                        - The examination was otherwise normal on direct and                         retroflexion views. Recommendation:        - Patient has a contact number available for                         emergencies. The signs and symptoms of potential                         delayed complications were discussed with the patient.                         Return to  normal activities tomorrow. Written                         discharge instructions were provided to the patient.                        - Discharge patient to home.                        - Resume previous diet.                        - Continue present medications.                        - No ibuprofen , naproxen, or other non-steroidal                         anti-inflammatory drugs for 5 days after polyp removal.                        - Resume Plavix  (clopidogrel ) at prior dose tomorrow.                         Refer to managing physician for further adjustment of                         therapy.                        - Await pathology results.                        - Repeat colonoscopy for surveillance based on                         pathology results.                        - Return to referring physician as previously                         scheduled.                        - The findings and recommendations were discussed with                         the patient. Procedure Code(s):     --- Professional ---                        [phone], Colonoscopy, flexible; with biopsy, single or                         multiple Diagnosis Code(s):     --- Professional ---                        Z86.010, Personal history of colonic polyps                        D12.4, Benign neoplasm of descending colon  D12.3, Benign neoplasm of transverse colon (hepatic                         flexure or splenic flexure)                        D12.2, Benign neoplasm of ascending colon                        K57.30, Diverticulosis of large intestine without                         perforation or abscess without bleeding CPT copyright 2022 American Medical Association. All rights reserved. The codes documented in this report are preliminary and upon coder review may  be revised to meet current compliance requirements. Attending Participation:      I personally performed the entire procedure. Polo Brisk, DO Quintin Buckle DO, DO 01/03/2024 11:27:52 AM This report has been signed electronically. Number of Addenda: 0 Note Initiated On: 01/03/2024 10:18 AM Scope Withdrawal Time: 0 hours 14 minutes 44 seconds  Total Procedure Duration: 0 hours 23 minutes 13 seconds  Estimated Blood Loss:  Estimated blood loss was minimal.      [facility]

## 2024-01-06 ENCOUNTER — Encounter: Payer: Self-pay | Admitting: Gastroenterology

## 2024-01-06 LAB — SURGICAL PATHOLOGY

## 2024-01-07 ENCOUNTER — Other Ambulatory Visit: Payer: Self-pay | Admitting: Family Medicine

## 2024-01-07 DIAGNOSIS — E7800 Pure hypercholesterolemia, unspecified: Secondary | ICD-10-CM

## 2024-01-07 DIAGNOSIS — I10 Essential (primary) hypertension: Secondary | ICD-10-CM

## 2024-01-07 DIAGNOSIS — E1159 Type 2 diabetes mellitus with other circulatory complications: Secondary | ICD-10-CM

## 2024-01-07 DIAGNOSIS — R351 Nocturia: Secondary | ICD-10-CM

## 2024-01-07 DIAGNOSIS — Z0000 Encounter for general adult medical examination without abnormal findings: Secondary | ICD-10-CM

## 2024-01-07 DIAGNOSIS — I2510 Atherosclerotic heart disease of native coronary artery without angina pectoris: Secondary | ICD-10-CM

## 2024-01-08 ENCOUNTER — Other Ambulatory Visit

## 2024-01-08 DIAGNOSIS — E1159 Type 2 diabetes mellitus with other circulatory complications: Secondary | ICD-10-CM | POA: Diagnosis not present

## 2024-01-08 DIAGNOSIS — I10 Essential (primary) hypertension: Secondary | ICD-10-CM | POA: Diagnosis not present

## 2024-01-08 DIAGNOSIS — E7800 Pure hypercholesterolemia, unspecified: Secondary | ICD-10-CM | POA: Diagnosis not present

## 2024-01-08 DIAGNOSIS — I2510 Atherosclerotic heart disease of native coronary artery without angina pectoris: Secondary | ICD-10-CM | POA: Diagnosis not present

## 2024-01-09 LAB — HEMOGLOBIN A1C
Hgb A1c MFr Bld: 6.5 % — ABNORMAL HIGH (ref <5.7)
Mean Plasma Glucose: 140 mg/dL
eAG (mmol/L): 7.7 mmol/L

## 2024-01-09 LAB — COMPREHENSIVE METABOLIC PANEL WITH GFR
AG Ratio: 2.3 (calc) (ref 1.0–2.5)
ALT: 12 U/L (ref 9–46)
AST: 13 U/L (ref 10–35)
Albumin: 4.1 g/dL (ref 3.6–5.1)
Alkaline phosphatase (APISO): 85 U/L (ref 35–144)
BUN: 20 mg/dL (ref 7–25)
CO2: 29 mmol/L (ref 20–32)
Calcium: 9.3 mg/dL (ref 8.6–10.3)
Chloride: 104 mmol/L (ref 98–110)
Creat: 0.85 mg/dL (ref 0.70–1.30)
Globulin: 1.8 g/dL — ABNORMAL LOW (ref 1.9–3.7)
Glucose, Bld: 223 mg/dL — ABNORMAL HIGH (ref 65–139)
Potassium: 4.1 mmol/L (ref 3.5–5.3)
Sodium: 140 mmol/L (ref 135–146)
Total Bilirubin: 0.3 mg/dL (ref 0.2–1.2)
Total Protein: 5.9 g/dL — ABNORMAL LOW (ref 6.1–8.1)
eGFR: 105 mL/min/{1.73_m2} (ref >=60)

## 2024-01-09 LAB — CBC WITH DIFFERENTIAL/PLATELET
Absolute Lymphocytes: 1316 {cells}/uL (ref 850–3900)
Absolute Monocytes: 665 {cells}/uL (ref 200–950)
Basophils Absolute: 42 {cells}/uL (ref 0–200)
Basophils Relative: 0.6 %
Eosinophils Absolute: 329 {cells}/uL (ref 15–500)
Eosinophils Relative: 4.7 %
HCT: 40.9 % (ref 38.5–50.0)
Hemoglobin: 13.5 g/dL (ref 13.2–17.1)
MCH: 33.2 pg — ABNORMAL HIGH (ref 27.0–33.0)
MCHC: 33 g/dL (ref 32.0–36.0)
MCV: 100.5 fL — ABNORMAL HIGH (ref 80.0–100.0)
MPV: 10.2 fL (ref 7.5–12.5)
Monocytes Relative: 9.5 %
Neutro Abs: 4648 {cells}/uL (ref 1500–7800)
Neutrophils Relative %: 66.4 %
Platelets: 284 10*3/uL (ref 140–400)
RBC: 4.07 10*6/uL — ABNORMAL LOW (ref 4.20–5.80)
RDW: 14.2 % (ref 11.0–15.0)
Total Lymphocyte: 18.8 %
WBC: 7 10*3/uL (ref 3.8–10.8)

## 2024-01-09 LAB — LIPID PANEL
Cholesterol: 179 mg/dL (ref <200)
HDL: 65 mg/dL (ref >=40)
LDL Cholesterol (Calc): 89 mg/dL
Non-HDL Cholesterol (Calc): 114 mg/dL (ref <130)
Total CHOL/HDL Ratio: 2.8 (calc) (ref <5.0)
Triglycerides: 150 mg/dL — ABNORMAL HIGH (ref <150)

## 2024-01-09 LAB — TSH: TSH: 1.2 m[IU]/L (ref 0.40–4.50)

## 2024-01-09 LAB — PSA: PSA: 1.45 ng/mL (ref <=4.00)

## 2024-01-10 ENCOUNTER — Other Ambulatory Visit: Payer: Self-pay | Admitting: Family Medicine

## 2024-01-10 DIAGNOSIS — E1159 Type 2 diabetes mellitus with other circulatory complications: Secondary | ICD-10-CM

## 2024-01-10 NOTE — Telephone Encounter (Unsigned)
 Copied from CRM [id number]. Topic: Clinical - Medication Refill >> Jan 10, 2024  9:41 AM Crispin Dolphin wrote: Medication: metFORMIN  (GLUCOPHAGE ) 1000 MG tablet  Has the patient contacted their pharmacy? Yes (Agent: If no, request that the patient contact the pharmacy for the refill. If patient does not wish to contact the pharmacy document the reason why and proceed with request.) (Agent: If yes, when and what did the pharmacy advise?)  This is the patient's preferred pharmacy:  [id number] [place], [place] - [address] [place] [address] Phone: [phone] Fax: [phone]  Is this the correct pharmacy for this prescription? Yes If no, delete pharmacy and type the correct one.   Has the prescription been filled recently? No  Is the patient out of the medication? Yes  Has the patient been seen for an appointment in the last year OR does the patient have an upcoming appointment? Yes  Can we respond through MyChart? Yes  Agent: Please be advised that Rx refills may take up to 3 business days. We ask that you follow-up with your pharmacy.

## 2024-01-12 ENCOUNTER — Other Ambulatory Visit: Payer: Self-pay | Admitting: Family Medicine

## 2024-01-12 DIAGNOSIS — M5030 Other cervical disc degeneration, unspecified cervical region: Secondary | ICD-10-CM

## 2024-01-12 DIAGNOSIS — M1712 Unilateral primary osteoarthritis, left knee: Secondary | ICD-10-CM

## 2024-01-12 DIAGNOSIS — M25311 Other instability, right shoulder: Secondary | ICD-10-CM

## 2024-01-13 ENCOUNTER — Ambulatory Visit: Payer: Self-pay

## 2024-01-13 MED ORDER — METFORMIN HCL 1000 MG PO TABS: 1000.0000 mg | ORAL_TABLET | Freq: Two times a day (BID) | ORAL | 0 refills | Status: DC

## 2024-01-13 NOTE — Telephone Encounter (Signed)
 Chief Complaint: medication question/refill Symptoms: blurry vision, frequent urination, increased thirst Frequency: x few days Pertinent Negatives: Patient denies fever, CP, SOB Disposition: [] ED /[x] Urgent Care (no appt availability in office) / [] Appointment(In office/virtual)/ []  [place] [facility]/ [] Home Care/ [] Refused Recommended Disposition /[] [facility] [facility]/ [x]  Follow-up with PCP Additional Notes: Pt initially calling with questions of Metformin  Rx. Pt reports he has been taking two 500mg  Metformin  to equal 1000mg , and ran out of Rx. Pt has been out since Wednesday/Thursday. Triager called pharmacy to confirm Rx correct. Per pharmacy rep, pt also had Metformin  500 mg from alternate provider.   Of note, triager called pt to notify pt of confusion and that correct Rx is in pharmacy system. Triager strongly recommended that pt go to UC for being symptomatic. Pt said he would not go to UC, but did verbalized understanding of need. Triager will forward encounter for Dr. Romeo Co 's office to review .    Copied from CRM [id number]. Topic: Clinical - Medication Question >> Jan 13, 2024  9:47 AM Jyl Or S wrote: Reason for CRM: Patient is requesting call back. Patient wants to know why the dosage was cut in half? Please follow up with patient Reason for Disposition  [1] Prescription refill request for ESSENTIAL medicine (i.e., likelihood of harm to patient if not taken) AND [2] triager unable to refill per department policy  [1] Symptoms of high blood sugar (e.g., abnormally thirsty, frequent urination, weight loss) AND [2] not able to test blood glucose  Answer Assessment - Initial Assessment Questions 1. NAME of MEDICINE: "What medicine(s) are you calling about?"     metFORMIN  (GLUCOPHAGE ) 1000 MG tablet 2. QUESTION: "What is your question?" (e.g., double dose of medicine, side effect)     Pt reports pharmacy has metFORMIN  (GLUCOPHAGE ) 1000 MG tablet 500 mg  Has been out  since Wednesday 3. PRESCRIBER: "Who prescribed the medicine?" Reason: if prescribed by specialist, call should be referred to that group.     PCP 4. SYMPTOMS: "Do you have any symptoms?" If Yes, ask: "What symptoms are you having?"  "How bad are the symptoms (e.g., mild, moderate, severe)     Yes, blurry vision, thirsty, increased urination Pt reports does not check BG  Answer Assessment - Initial Assessment Questions 1. BLOOD GLUCOSE: "What is your blood glucose level?"      unknown 2. ONSET: "When did you check the blood glucose?"     Does not have right strips for machine 3. USUAL RANGE: "What is your glucose level usually?" (e.g., usual fasting morning value, usual evening value)     Does not usually check 4. KETONES: "Do you check for ketones (urine or blood test strips)?" If Yes, ask: "What does the test show now?"      Does not have ketone strips 5. TYPE 1 or 2:  "Do you know what type of diabetes you have?"  (e.g., Type 1, Type 2, Gestational; doesn't know)      3 6. INSULIN : "Do you take insulin ?" "What type of insulin (s) do you use? What is the mode of delivery? (syringe, pen; injection or pump)?"      N/a 7. DIABETES PILLS: "Do you take any pills for your diabetes?" If Yes, ask: "Have you missed taking any pills recently?"     metformin  8. OTHER SYMPTOMS: "Do you have any symptoms?" (e.g., fever, frequent urination, difficulty breathing, dizziness, weakness, vomiting)     Frequent urination, blurry vision, extremely thirsty  Protocols used: Medication Question Call-A-AH,  Diabetes - High Blood Sugar-A-AH, Medication Refill and Renewal Call-A-AH

## 2024-01-13 NOTE — Telephone Encounter (Signed)
 Requested medications are due for refill today.  yes  Requested medications are on the active medications list. yes  Last refill. 08/02/2022 #180 0 rf  Future visit scheduled.   yes  Notes to clinic.  Labs are expired.    Requested Prescriptions  Pending Prescriptions Disp Refills   metFORMIN  (GLUCOPHAGE ) 1000 MG tablet 180 tablet 0    Sig: Take 1 tablet (1,000 mg total) by mouth 2 (two) times daily with a meal.     Endocrinology:  Diabetes - Biguanides Failed - 01/13/2024  2:53 PM      Failed - B12 Level in normal range and within 720 days    Vitamin B-12  Date Value Ref Range Status  04/19/2022 142 (L) 180 - 914 pg/mL Final    Comment:    (NOTE) This assay is not validated for testing neonatal or myeloproliferative syndrome specimens for Vitamin B12 levels. Performed at [facility], [address] [place], [place] [address]          Failed - CBC within normal limits and completed in the last 12 months    WBC  Date Value Ref Range Status  01/08/2024 7.0 3.8 - 10.8 Thousand/uL Final   RBC  Date Value Ref Range Status  01/08/2024 4.07 (L) 4.20 - 5.80 Million/uL Final   Hemoglobin  Date Value Ref Range Status  01/08/2024 13.5 13.2 - 17.1 g/dL Final   HCT  Date Value Ref Range Status  01/08/2024 40.9 38.5 - 50.0 % Final   MCHC  Date Value Ref Range Status  01/08/2024 33.0 32.0 - 36.0 g/dL Final    Comment:    For adults, a slight decrease in the calculated MCHC value (in the range of 30 to 32 g/dL) is most likely not clinically significant; however, it should be interpreted with caution in correlation with other red cell parameters and the patient's clinical condition.    MCH  Date Value Ref Range Status  01/08/2024 33.2 (H) 27.0 - 33.0 pg Final   MCV  Date Value Ref Range Status  01/08/2024 100.5 (H) 80.0 - 100.0 fL Final   No results found for: "PLTCOUNTKUC", "LABPLAT", "POCPLA" RDW  Date Value Ref Range Status  01/08/2024 14.2 11.0 - 15.0  % Final         Passed - Cr in normal range and within 360 days    Creat  Date Value Ref Range Status  01/08/2024 0.85 0.70 - 1.30 mg/dL Final         Passed - HBA1C is between 0 and 7.9 and within 180 days    Hgb A1c MFr Bld  Date Value Ref Range Status  01/08/2024 6.5 (H) <5.7 % Final    Comment:    For someone without known diabetes, a hemoglobin A1c value of 6.5% or greater indicates that they may have  diabetes and this should be confirmed with a follow-up  test. . For someone with known diabetes, a value <7% indicates  that their diabetes is well controlled and a value  greater than or equal to 7% indicates suboptimal  control. A1c targets should be individualized based on  duration of diabetes, age, comorbid conditions, and  other considerations. . Currently, no consensus exists regarding use of hemoglobin A1c for diagnosis of diabetes for children. .          Passed - eGFR in normal range and within 360 days    GFR, Est African American  Date Value Ref Range Status  06/03/2019 118 > OR = 60 mL/min/1.24m2 Final   GFR, Est Non African American  Date Value Ref Range Status  06/03/2019 102 > OR = 60 mL/min/1.11m2 Final   GFR, Estimated  Date Value Ref Range Status  07/12/2023 >60 >60 mL/min Final    Comment:    (NOTE) Calculated using the CKD-EPI Creatinine Equation (2021)    eGFR  Date Value Ref Range Status  01/08/2024 105 > OR = 60 mL/min/1.1m2 Final         Passed - Valid encounter within last 6 months    Recent Outpatient Visits           2 months ago Chronic pain of left knee   [facility] [place], Kayleen Party, DO   3 months ago Unintentional weight loss   [facility] [place], Kayleen Party, [place]

## 2024-01-15 ENCOUNTER — Telehealth: Payer: Self-pay

## 2024-01-15 ENCOUNTER — Encounter: Admitting: Family Medicine

## 2024-01-15 DIAGNOSIS — M5030 Other cervical disc degeneration, unspecified cervical region: Secondary | ICD-10-CM

## 2024-01-15 DIAGNOSIS — M1712 Unilateral primary osteoarthritis, left knee: Secondary | ICD-10-CM

## 2024-01-15 DIAGNOSIS — M25311 Other instability, right shoulder: Secondary | ICD-10-CM

## 2024-01-15 MED ORDER — TRAMADOL HCL 50 MG PO TABS: 50.0000 mg | ORAL_TABLET | ORAL | 0 refills | Status: DC | PRN

## 2024-01-15 NOTE — Telephone Encounter (Signed)
 [facility]  He did fill Tramadol  on 5/7 according to the [place] Controlled Substance Database.  Domingo Friend, DO [facility] 01/15/2024, 1:01 PM

## 2024-01-15 NOTE — Addendum Note (Signed)
 Addended by: Raina Bunting on: 01/15/2024 01:01 PM   Modules accepted: Orders

## 2024-01-15 NOTE — Telephone Encounter (Signed)
 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  Last refill:  Tramadol :12/29/23 #42  Gabapentin : ?07/08/23  Future visit scheduled: yes  Notes to clinic:  Tramadol : not delegated to NT to Reorder    Gabapentin : no data connected on AML   Requested Prescriptions  Pending Prescriptions Disp Refills   traMADol  (ULTRAM ) 50 MG tablet [Pharmacy Med Name: TRAMADOL  HCL 50 MG TABLET] 42 tablet 0    Sig: TAKE 1 TABLET BY MOUTH EVERY 4 HOURS AS NEEDED FOR MODERATE PAIN FOR 7 DAYS     Not Delegated - Analgesics:  Opioid Agonists Failed - 01/15/2024  8:11 AM      Failed - This refill cannot be delegated      Passed - Urine Drug Screen completed in last 360 days      Passed - Valid encounter within last 3 months    Recent Outpatient Visits           2 months ago Chronic pain of left knee   [facility] Raina Bunting, DO   3 months ago Unintentional weight loss    [facility], Kayleen Party, DO               gabapentin  (NEURONTIN ) 800 MG tablet [Pharmacy Med Name: GABAPENTIN  800 MG TABLET] 360 tablet 3    Sig: TAKE 1 TABLET BY MOUTH FOUR TIMES A DAY     Neurology: Anticonvulsants - gabapentin  Passed - 01/15/2024  8:11 AM      Passed - Cr in normal range and within 360 days    Creat  Date Value Ref Range Status  01/08/2024 0.85 0.70 - 1.30 mg/dL Final         Passed - Completed PHQ-2 or PHQ-9 in the last 360 days      Passed - Valid encounter within last 12 months    Recent Outpatient Visits           2 months ago Chronic pain of left knee   [facility] [place], Kayleen Party, DO   3 months ago Unintentional weight loss   [facility] [place], Kayleen Party, [place]

## 2024-01-15 NOTE — Telephone Encounter (Signed)
 Copied from CRM [id number]. Topic: Clinical - Prescription Issue >> Jan 15, 2024 11:24 AM Antwanette L wrote: Reason for CRM: [facility] traMADol  (ULTRAM ) 50 MG tablet. I told the patient the order was sent on 5/7. [facility] but he never got the tramadol . The patient is requesting a call back at [phone]

## 2024-01-16 ENCOUNTER — Encounter: Payer: Self-pay | Admitting: Family Medicine

## 2024-01-16 ENCOUNTER — Ambulatory Visit (INDEPENDENT_AMBULATORY_CARE_PROVIDER_SITE_OTHER): Admitting: Family Medicine

## 2024-01-16 ENCOUNTER — Other Ambulatory Visit: Payer: Self-pay | Admitting: Family Medicine

## 2024-01-16 VITALS — BP 132/84 | HR 66 | Ht 72.0 in | Wt 201.0 lb

## 2024-01-16 DIAGNOSIS — M67911 Unspecified disorder of synovium and tendon, right shoulder: Secondary | ICD-10-CM | POA: Diagnosis not present

## 2024-01-16 DIAGNOSIS — I10 Essential (primary) hypertension: Secondary | ICD-10-CM

## 2024-01-16 DIAGNOSIS — E1159 Type 2 diabetes mellitus with other circulatory complications: Secondary | ICD-10-CM

## 2024-01-16 DIAGNOSIS — Z0000 Encounter for general adult medical examination without abnormal findings: Secondary | ICD-10-CM

## 2024-01-16 DIAGNOSIS — I2510 Atherosclerotic heart disease of native coronary artery without angina pectoris: Secondary | ICD-10-CM

## 2024-01-16 DIAGNOSIS — Z23 Encounter for immunization: Secondary | ICD-10-CM

## 2024-01-16 DIAGNOSIS — R112 Nausea with vomiting, unspecified: Secondary | ICD-10-CM

## 2024-01-16 DIAGNOSIS — K529 Noninfective gastroenteritis and colitis, unspecified: Secondary | ICD-10-CM

## 2024-01-16 DIAGNOSIS — R152 Fecal urgency: Secondary | ICD-10-CM

## 2024-01-16 DIAGNOSIS — M25511 Pain in right shoulder: Secondary | ICD-10-CM

## 2024-01-16 DIAGNOSIS — M5030 Other cervical disc degeneration, unspecified cervical region: Secondary | ICD-10-CM

## 2024-01-16 DIAGNOSIS — G8929 Other chronic pain: Secondary | ICD-10-CM | POA: Diagnosis not present

## 2024-01-16 DIAGNOSIS — E7800 Pure hypercholesterolemia, unspecified: Secondary | ICD-10-CM

## 2024-01-16 DIAGNOSIS — M1712 Unilateral primary osteoarthritis, left knee: Secondary | ICD-10-CM | POA: Diagnosis not present

## 2024-01-16 DIAGNOSIS — Z7984 Long term (current) use of oral hypoglycemic drugs: Secondary | ICD-10-CM | POA: Diagnosis not present

## 2024-01-16 MED ORDER — METFORMIN HCL 1000 MG PO TABS: 1000.0000 mg | ORAL_TABLET | Freq: Two times a day (BID) | ORAL | 5 refills | Status: DC

## 2024-01-16 MED ORDER — LISINOPRIL 40 MG PO TABS: 40.0000 mg | ORAL_TABLET | Freq: Every day | ORAL | 5 refills | Status: DC

## 2024-01-16 MED ORDER — CARVEDILOL 12.5 MG PO TABS
12.5000 mg | ORAL_TABLET | Freq: Two times a day (BID) | ORAL | 5 refills | Status: DC
Start: 2024-01-16 — End: 2024-07-03

## 2024-01-16 MED ORDER — DICYCLOMINE HCL 20 MG PO TABS: 20.0000 mg | ORAL_TABLET | Freq: Three times a day (TID) | ORAL | 1 refills | Status: AC

## 2024-01-16 MED ORDER — AMLODIPINE BESYLATE 10 MG PO TABS: 10.0000 mg | ORAL_TABLET | Freq: Every day | ORAL | 5 refills | Status: DC

## 2024-01-16 MED ORDER — GABAPENTIN 800 MG PO TABS: 800.0000 mg | ORAL_TABLET | Freq: Four times a day (QID) | ORAL | 5 refills | Status: DC

## 2024-01-16 MED ORDER — ONDANSETRON 8 MG PO TBDP: 8.0000 mg | ORAL_TABLET | Freq: Three times a day (TID) | ORAL | 5 refills | Status: AC | PRN
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
## 2024-01-16 NOTE — Patient Instructions (Addendum)
 Thank you for coming to the office today.  Lab results look good overall  Recent Labs    03/11/23 0625 06/12/23 1535 01/08/24 1006  HGBA1C 7.2* 6.8* 6.5*   Refills sent to [facility]  Contact us  or [facility] in advance for the pain medication and seroquel  refill, will switch to just 50mg  in single pill.  Urine check protein kidney function.  Referral to [facility] us  back for the Heart Scan or we can order NEXT TIME Coronary Calcium  Score Cardiac CT Scan. This is a screening test for patients aged 48-50+ with cardiovascular risk factors or who are healthy but would be interested in Cardiovascular Screening for heart disease. Even if there is a family history of heart disease, this imaging can be useful. Typically it can be done every 5+ years or at a different timeline we agree on  The scan will look at the chest and mainly focus on the heart and identify early signs of calcium  build up or blockages within the heart arteries. It is not 100% accurate for identifying blockages or heart disease, but it is useful to help us  predict who may have some early changes or be at risk in the future for a heart attack or cardiovascular problem.  The results are reviewed by a Cardiologist and they will document the results. It should become available on MyChart. Typically the results are divided into percentiles based on other patients of the same demographic and age. So it will compare your risk to others similar to you. If you have a higher score >99 or higher percentile >75%tile, it is recommended to consider Statin cholesterol therapy and or referral to Cardiologist. I will try to help explain your results and if we have questions we can contact the Cardiologist.  You will be contacted for scheduling. Usually it is done at any imaging facility through [facility], [facility] or [facility].  The cost is $99 flat fee total and it does not go through  insurance, so no authorization is required.   Please schedule a Follow-up Appointment to: Return in about 6 months (around 07/18/2024) for 6 month DM A1c, Ortho Updates, meds.  If you have any other questions or concerns, please feel free to call the office or send a message through MyChart. You may also schedule an earlier appointment if necessary.  Additionally, you may be receiving a survey about your experience at our office within a few days to 1 week by e-mail or mail. We value your feedback.  Domingo Friend, DO [facility], [place]

## 2024-01-17 LAB — MICROALBUMIN / CREATININE URINE RATIO
Creatinine, Urine: 27 mg/dL (ref 20–320)
Microalb Creat Ratio: 11 mg/g{creat} (ref <30)
Microalb, Ur: 0.3 mg/dL

## 2024-01-22 ENCOUNTER — Encounter: Payer: Self-pay | Admitting: Family Medicine

## 2024-01-22 ENCOUNTER — Ambulatory Visit (INDEPENDENT_AMBULATORY_CARE_PROVIDER_SITE_OTHER): Admitting: Family Medicine

## 2024-01-22 VITALS — BP 132/84 | HR 71 | Ht 72.0 in | Wt 207.2 lb

## 2024-01-22 DIAGNOSIS — M1712 Unilateral primary osteoarthritis, left knee: Secondary | ICD-10-CM | POA: Diagnosis not present

## 2024-01-22 DIAGNOSIS — G8929 Other chronic pain: Secondary | ICD-10-CM | POA: Diagnosis not present

## 2024-01-22 DIAGNOSIS — M25562 Pain in left knee: Secondary | ICD-10-CM | POA: Diagnosis not present

## 2024-01-22 MED ORDER — HYDROCODONE-ACETAMINOPHEN 5-325 MG PO TABS: 1.0000 | ORAL_TABLET | Freq: Four times a day (QID) | ORAL | 0 refills | Status: DC | PRN

## 2024-01-22 NOTE — Patient Instructions (Addendum)
 Thank you for coming to the office today.  New referral to Orthopedics for LEFT Knee  [facility] (formerly [facility]) Address: [address] [place], [place], [place] [address] Hours:  9AM-5PM Phone: [phone]  Defer X-ray here today since they will be checking X-ray there.  Stop Tramadol  and switch to Hydrocodone .  Please schedule a Follow-up Appointment to: Return if symptoms worsen or fail to improve.  If you have any other questions or concerns, please feel free to call the office or send a message through MyChart. You may also schedule an earlier appointment if necessary.  Additionally, you may be receiving a survey about your experience at our office within a few days to 1 week by e-mail or mail. We value your feedback.  Domingo Friend, DO [facility], [place]

## 2024-01-22 NOTE — Progress Notes (Signed)
 Subjective:    Patient ID: Tyler Martinez, male    DOB: 08/19/1971, 53 y.o.   MRN: [id number]  Tyler Martinez is a 53 y.o. male presenting on 01/22/2024 for Knee Pain and Osteoarthritis   HPI  Discussed the use of AI scribe software for clinical note transcription with the patient, who gave verbal consent to proceed.  History of Present Illness   Tyler Martinez is a 53 year old male who presents with chronic left knee pain.  He has experienced left knee pain for most of his life, but over the past three days, the knee has become significantly swollen, approximately twice the size of the right knee. He describes a sensation of the knee 'trying to fold backwards' and is concerned about the potential for it to give way with the wrong step.  He has not had a recent x-ray of the left knee, with the last one being several years ago. He has previously received treatments such as rooster comb injections and high-powered cortisone shots, which provided temporary relief.  He no longer needs Orthopedic for his R Shoulder, and prefers new referral for L Knee instead.  He is currently taking tramadol , 42 pills per bottle lasting approximately two weeks, but reports that the pain has worsened and he is seeking stronger pain management. He has a history of trying various medications and treatments for his knee pain, including muscle relaxants like Flexeril, which he dislikes due to side effects such as restless legs. He is familiar with the effects of different medications on his body and is seeking a more effective pain management strategy.          01/16/2024    9:16 AM 11/07/2023   10:47 AM 10/15/2023    3:19 PM  Depression screen PHQ 2/9  Decreased Interest 1 1 1   Down, Depressed, Hopeless 1 1 0  PHQ - 2 Score 2 2 1   Altered sleeping 1 1 0  Tired, decreased energy 1 1 1   Change in appetite 1 0 1  Feeling bad or failure about yourself  0 0   Trouble concentrating 1 1 1   Moving  slowly or fidgety/restless 1 0 0  Suicidal thoughts 0 0 0  PHQ-9 Score 7 5 4   Difficult doing work/chores Somewhat difficult Very difficult        01/16/2024    9:17 AM 11/07/2023   10:47 AM 10/15/2023    3:19 PM 06/12/2023    3:30 PM  GAD 7 : Generalized Anxiety Score  Nervous, Anxious, on Edge 1 0 0   Control/stop worrying 1 0 1 1  Worry too much - different things 1 0 0 1  Trouble relaxing 0 0 0 1  Restless 1 0  1  Easily annoyed or irritable 1 0 0 1  Afraid - awful might happen 0 0 0 0  Total GAD 7 Score 5 0      Social History   Tobacco Use   Smoking status: Every Day    Current packs/day: 1.00    Average packs/day: 1 pack/day for 8.0 years (8.0 ttl pk-yrs)    Types: Cigarettes   Smokeless tobacco: Never  Vaping Use   Vaping status: Never Used  Substance Use Topics   Alcohol use: Not Currently    Alcohol/week: 2.0 standard drinks of alcohol    Types: 2 Standard drinks or equivalent per week    Comment: Drinks vodka every evening   Drug use: Not Currently  Types: Amphetamines    Review of Systems Per HPI unless specifically indicated above     Objective:     BP 132/84 (BP Location: Left Arm, Patient Position: Sitting, Cuff Size: Normal)   Pulse 71   Ht 6' (1.829 m)   Wt 207 lb 4 oz (94 kg)   SpO2 95%   BMI 28.11 kg/m   Wt Readings from Last 3 Encounters:  01/22/24 207 lb 4 oz (94 kg)  01/16/24 201 lb (91.2 kg)  01/03/24 191 lb 9.6 oz (86.9 kg)    Physical Exam Vitals and nursing note reviewed.  Constitutional:      General: He is not in acute distress.    Appearance: Normal appearance. He is well-developed. He is not diaphoretic.     Comments: Well-appearing, comfortable, cooperative  HENT:     Head: Normocephalic and atraumatic.  Eyes:     General:        Right eye: No discharge.        Left eye: No discharge.     Conjunctiva/sclera: Conjunctivae normal.  Cardiovascular:     Rate and Rhythm: Normal rate.  Pulmonary:     Effort:  Pulmonary effort is normal.  Musculoskeletal:     Comments: Left Knee with bulky appearance some crepitus on motion and joint line pain.  Skin:    General: Skin is warm and dry.     Findings: No erythema or rash.  Neurological:     Mental Status: He is alert and oriented to person, place, and time.  Psychiatric:        Mood and Affect: Mood normal.        Behavior: Behavior normal.        Thought Content: Thought content normal.     Comments: Well groomed, good eye contact, normal speech and thoughts     Results for orders placed or performed in visit on 01/16/24  Urine Microalbumin w/creat. ratio   Collection Time: 01/16/24  9:16 AM  Result Value Ref Range   Creatinine, Urine 27 20 - 320 mg/dL   Microalb, Ur 0.3 mg/dL   Microalb Creat Ratio 11 <30 mg/g creat      Assessment & Plan:   Problem List Items Addressed This Visit   None Visit Diagnoses       Chronic pain of left knee    -  Primary   Relevant Medications   HYDROcodone -acetaminophen  (NORCO/VICODIN) 5-325 MG tablet     Primary osteoarthritis of left knee       Relevant Medications   HYDROcodone -acetaminophen  (NORCO/VICODIN) 5-325 MG tablet       Chronic Left knee pain / Osteoarthritis  Chronic left knee pain with recent exacerbation, significant swelling, and functional impairment. Previous treatments included cortisone injections years prior. Current tramadol  regimen insufficient. Referral to Orthopedics planned for further evaluation and management.  - Refer to [facility] for evaluation and management. - Discontinue tramadol . - Prescribe hydrocodone  5/325 mg, 40 tablets, PRN for pain, max 6/day, until orthopedic evaluation. - Provide [facility] contact information and ensure referral coordinator switches referral from shoulder to knee. - Send prescription to [facility] and inform him of potential insurance coverage issues.    No orders of the defined types were placed in this  encounter.   Meds ordered this encounter  Medications   HYDROcodone -acetaminophen  (NORCO/VICODIN) 5-325 MG tablet    Sig: Take 1-2 tablets by mouth every 6 (six) hours as needed for moderate pain (pain score 4-6).  Dispense:  40 tablet    Refill:  0    Switch off of Tramadol  and on to Hydrocodone     Follow up plan: Return if symptoms worsen or fail to improve.  Domingo Friend, DO [facility] 01/22/2024, 11:26 AM

## 2024-01-23 DIAGNOSIS — M2342 Loose body in knee, left knee: Secondary | ICD-10-CM | POA: Diagnosis not present

## 2024-01-23 DIAGNOSIS — M545 Low back pain, unspecified: Secondary | ICD-10-CM | POA: Diagnosis not present

## 2024-01-28 DIAGNOSIS — M2342 Loose body in knee, left knee: Secondary | ICD-10-CM | POA: Diagnosis not present

## 2024-02-03 ENCOUNTER — Telehealth: Payer: Self-pay

## 2024-02-03 DIAGNOSIS — M1712 Unilateral primary osteoarthritis, left knee: Secondary | ICD-10-CM

## 2024-02-03 DIAGNOSIS — G8929 Other chronic pain: Secondary | ICD-10-CM

## 2024-02-03 MED ORDER — HYDROCODONE-ACETAMINOPHEN 5-325 MG PO TABS
1.0000 | ORAL_TABLET | Freq: Four times a day (QID) | ORAL | 0 refills | Status: DC | PRN
Start: 2024-02-03 — End: 2024-02-20

## 2024-02-03 NOTE — Telephone Encounter (Signed)
 Copied from CRM [id number]. Topic: Clinical - Prescription Issue >> Feb 03, 2024  3:21 PM Zipporah Him wrote: Reason for CRM: Patient calling to see if the office received a notification for refill for his HYDROcodone -acetaminophen  (NORCO/VICODIN) 5-325 MG tablet. His pharmacy sent over a request to refill. Please advise.

## 2024-02-03 NOTE — Telephone Encounter (Signed)
 Please notify him he can go pick up refill today  I did not receive a refill request and I don't see one on the chart.  I have sent a refill to [facility] today 02/03/24, after 5pm.  Tyler Friend, DO [facility] 02/03/2024, 5:57 PM

## 2024-02-03 NOTE — Addendum Note (Signed)
 Addended by: Raina Bunting on: 02/03/2024 05:57 PM   Modules accepted: Orders

## 2024-02-04 ENCOUNTER — Telehealth: Payer: Self-pay | Admitting: Family Medicine

## 2024-02-04 DIAGNOSIS — M25562 Pain in left knee: Secondary | ICD-10-CM | POA: Diagnosis not present

## 2024-02-04 NOTE — Telephone Encounter (Signed)
Patient notified refill is at the pharmacy

## 2024-02-04 NOTE — Telephone Encounter (Unsigned)
 Copied from CRM [id number]. Topic: Clinical - Medication Refill >> Feb 04, 2024  4:03 PM Yolanda T wrote: Medication: QUEtiapine  (SEROQUEL ) 50 MG tablet  Has the patient contacted their pharmacy? No  This is the patient's preferred pharmacy:  [facility] - [place], [place] - [address] [place] [address] Phone: [phone] Fax: [phone]  Is this the correct pharmacy for this prescription? Yes  Has the prescription been filled recently? Yes  Is the patient out of the medication? Yes  Has the patient been seen for an appointment in the last year OR does the patient have an upcoming appointment? Yes  Can we respond through MyChart? Yes  Agent: Please be advised that Rx refills may take up to 3 business days. We ask that you follow-up with your pharmacy.

## 2024-02-06 NOTE — Telephone Encounter (Signed)
 Requested medication (s) are due for refill today -unsure  Requested medication (s) are on the active medication list -yes  Future visit scheduled -yes  Last refill: medication listed as historical  Notes to clinic: non delegated Rx  Requested Prescriptions  Pending Prescriptions Disp Refills   QUEtiapine  (SEROQUEL ) 50 MG tablet      Sig: Take 1 tablet (50 mg total) by mouth at bedtime.     Not Delegated - Psychiatry:  Antipsychotics - Second Generation (Atypical) - quetiapine  Failed - 02/06/2024 10:46 AM      Failed - This refill cannot be delegated      Failed - Lipid Panel in normal range within the last 12 months    Cholesterol  Date Value Ref Range Status  01/08/2024 179 <200 mg/dL Final   LDL Cholesterol (Calc)  Date Value Ref Range Status  01/08/2024 89 mg/dL (calc) Final    Comment:    Reference range: <100 . Desirable range <100 mg/dL for primary prevention;   <70 mg/dL for patients with CHD or diabetic patients  with > or = 2 CHD risk factors. Aaron Aas LDL-C is now calculated using the Martin-Hopkins  calculation, which is a validated novel method providing  better accuracy than the Friedewald equation in the  estimation of LDL-C.  Melinda Sprawls et al. Erroll Heard. 1610;960(45): 2061-2068  (http://education.[facility].com/faq/FAQ164)    HDL  Date Value Ref Range Status  01/08/2024 65 > OR = 40 mg/dL Final   Triglycerides  Date Value Ref Range Status  01/08/2024 150 (H) <150 mg/dL Final         Passed - TSH in normal range and within 360 days    TSH  Date Value Ref Range Status  01/08/2024 1.20 0.40 - 4.50 mIU/L Final         Passed - Completed PHQ-2 or PHQ-9 in the last 360 days      Passed - Last BP in normal range    BP Readings from Last 1 Encounters:  01/22/24 132/84         Passed - Last Heart Rate in normal range    Pulse Readings from Last 1 Encounters:  01/22/24 71         Passed - Valid encounter within last 6 months    Recent Outpatient  Visits           2 weeks ago Chronic pain of left knee   [facility] [place], Kayleen Party, DO   3 weeks ago Annual physical exam   [place] [facility] [place], Kayleen Party, DO   3 months ago Chronic pain of left knee   [place] [facility] [place], Kayleen Party, DO   3 months ago Unintentional weight loss   [place] [facility] [place], Kayleen Party, DO       Future Appointments             In 4 months [place], Kayleen Party, DO [place] [facility]            Passed - CBC within normal limits and completed in the last 12 months    WBC  Date Value Ref Range Status  01/08/2024 7.0 3.8 - 10.8 Thousand/uL Final   RBC  Date Value Ref Range Status  01/08/2024 4.07 (L) 4.20 - 5.80 Million/uL Final   Hemoglobin  Date Value Ref Range Status  01/08/2024 13.5 13.2 - 17.1 g/dL Final  HCT  Date Value Ref Range Status  01/08/2024 40.9 38.5 - 50.0 % Final   MCHC  Date Value Ref Range Status  01/08/2024 33.0 32.0 - 36.0 g/dL Final    Comment:    For adults, a slight decrease in the calculated MCHC value (in the range of 30 to 32 g/dL) is most likely not clinically significant; however, it should be interpreted with caution in correlation with other red cell parameters and the patient's clinical condition.    [facility]  Date Value Ref Range Status  01/08/2024 33.2 (H) 27.0 - 33.0 pg Final   MCV  Date Value Ref Range Status  01/08/2024 100.5 (H) 80.0 - 100.0 fL Final   No results found for: PLTCOUNTKUC, LABPLAT, POCPLA RDW  Date Value Ref Range Status  01/08/2024 14.2 11.0 - 15.0 % Final         Passed - CMP within normal limits and completed in the last 12 months    Albumin  Date Value Ref Range Status  04/18/2023 3.4 (L) 3.5 - 5.0 g/dL Final   Alkaline Phosphatase  Date Value Ref Range Status  04/18/2023 90 38 - 126 U/L Final    Alkaline phosphatase (APISO)  Date Value Ref Range Status  01/08/2024 85 35 - 144 U/L Final   ALT  Date Value Ref Range Status  01/08/2024 12 9 - 46 U/L Final   AST  Date Value Ref Range Status  01/08/2024 13 10 - 35 U/L Final   BUN  Date Value Ref Range Status  01/08/2024 20 7 - 25 mg/dL Final   Calcium   Date Value Ref Range Status  01/08/2024 9.3 8.6 - 10.3 mg/dL Final   CO2  Date Value Ref Range Status  01/08/2024 29 20 - 32 mmol/L Final   Creat  Date Value Ref Range Status  01/08/2024 0.85 0.70 - 1.30 mg/dL Final   Creatinine, Urine  Date Value Ref Range Status  01/16/2024 27 20 - 320 mg/dL Final   Glucose, Bld  Date Value Ref Range Status  01/08/2024 223 (H) 65 - 139 mg/dL Final    Comment:    .        Non-fasting reference interval .    Glucose-Capillary  Date Value Ref Range Status  01/03/2024 113 (H) 70 - 99 mg/dL Final    Comment:    Glucose reference range applies only to samples taken after fasting for at least 8 hours.   Potassium  Date Value Ref Range Status  01/08/2024 4.1 3.5 - 5.3 mmol/L Final   Sodium  Date Value Ref Range Status  01/08/2024 140 135 - 146 mmol/L Final   Total Bilirubin  Date Value Ref Range Status  01/08/2024 0.3 0.2 - 1.2 mg/dL Final   Bilirubin, Direct  Date Value Ref Range Status  07/28/2021 0.1 0.0 - 0.2 mg/dL Final   Indirect Bilirubin  Date Value Ref Range Status  07/28/2021 0.5 0.3 - 0.9 mg/dL Final    Comment:    Performed at [facility], [address] [place], [place] [address]   Protein, ur  Date Value Ref Range Status  03/10/2023 30 (A) NEGATIVE mg/dL Final   Total Protein  Date Value Ref Range Status  01/08/2024 5.9 (L) 6.1 - 8.1 g/dL Final   GFR, Est African American  Date Value Ref Range Status  06/03/2019 118 > OR = 60 mL/min/1.36m2 Final   eGFR  Date Value Ref Range Status  01/08/2024 105 > OR = 60  mL/min/1.3m2 Final   GFR, Est Non African American  Date Value Ref  Range Status  06/03/2019 102 > OR = 60 mL/min/1.26m2 Final   GFR, Estimated  Date Value Ref Range Status  07/12/2023 >60 >60 mL/min Final    Comment:    (NOTE) Calculated using the CKD-EPI Creatinine Equation (2021)             Requested Prescriptions  Pending Prescriptions Disp Refills   QUEtiapine  (SEROQUEL ) 50 MG tablet      Sig: Take 1 tablet (50 mg total) by mouth at bedtime.     Not Delegated - Psychiatry:  Antipsychotics - Second Generation (Atypical) - quetiapine  Failed - 02/06/2024 10:46 AM      Failed - This refill cannot be delegated      Failed - Lipid Panel in normal range within the last 12 months    Cholesterol  Date Value Ref Range Status  01/08/2024 179 <200 mg/dL Final   LDL Cholesterol (Calc)  Date Value Ref Range Status  01/08/2024 89 mg/dL (calc) Final    Comment:    Reference range: <100 . Desirable range <100 mg/dL for primary prevention;   <70 mg/dL for patients with CHD or diabetic patients  with > or = 2 CHD risk factors. Aaron Aas LDL-C is now calculated using the Martin-Hopkins  calculation, which is a validated novel method providing  better accuracy than the Friedewald equation in the  estimation of LDL-C.  Melinda Sprawls et al. Erroll Heard. 1610;960(45): 2061-2068  (http://education.[facility].com/faq/FAQ164)    HDL  Date Value Ref Range Status  01/08/2024 65 > OR = 40 mg/dL Final   Triglycerides  Date Value Ref Range Status  01/08/2024 150 (H) <150 mg/dL Final         Passed - TSH in normal range and within 360 days    TSH  Date Value Ref Range Status  01/08/2024 1.20 0.40 - 4.50 mIU/L Final         Passed - Completed PHQ-2 or PHQ-9 in the last 360 days      Passed - Last BP in normal range    BP Readings from Last 1 Encounters:  01/22/24 132/84         Passed - Last Heart Rate in normal range    Pulse Readings from Last 1 Encounters:  01/22/24 71         Passed - Valid encounter within last 6 months    Recent Outpatient  Visits           2 weeks ago Chronic pain of left knee   [place] [facility] [place] [place], Kayleen Party, DO   3 weeks ago Annual physical exam   [place] [place], Kayleen Party, DO   3 months ago Chronic pain of left knee   [facility] [place], Kayleen Party, DO   3 months ago Unintentional weight loss   [facility] [place], Kayleen Party, DO       Future Appointments             In 4 months [place], Kayleen Party, DO [facility], [facility]            Passed - CBC within normal limits and completed in the last 12 months    WBC  Date Value Ref Range Status  01/08/2024 7.0 3.8 - 10.8 Thousand/uL Final   RBC  Date Value Ref Range  Status  01/08/2024 4.07 (L) 4.20 - 5.80 Million/uL Final   Hemoglobin  Date Value Ref Range Status  01/08/2024 13.5 13.2 - 17.1 g/dL Final   HCT  Date Value Ref Range Status  01/08/2024 40.9 38.5 - 50.0 % Final   MCHC  Date Value Ref Range Status  01/08/2024 33.0 32.0 - 36.0 g/dL Final    Comment:    For adults, a slight decrease in the calculated MCHC value (in the range of 30 to 32 g/dL) is most likely not clinically significant; however, it should be interpreted with caution in correlation with other red cell parameters and the patient's clinical condition.    [facility]  Date Value Ref Range Status  01/08/2024 33.2 (H) 27.0 - 33.0 pg Final   MCV  Date Value Ref Range Status  01/08/2024 100.5 (H) 80.0 - 100.0 fL Final   No results found for: PLTCOUNTKUC, LABPLAT, POCPLA RDW  Date Value Ref Range Status  01/08/2024 14.2 11.0 - 15.0 % Final         Passed - CMP within normal limits and completed in the last 12 months    Albumin  Date Value Ref Range Status  04/18/2023 3.4 (L) 3.5 - 5.0 g/dL Final   Alkaline Phosphatase  Date Value Ref Range Status  04/18/2023 90 38 - 126 U/L Final    Alkaline phosphatase (APISO)  Date Value Ref Range Status  01/08/2024 85 35 - 144 U/L Final   ALT  Date Value Ref Range Status  01/08/2024 12 9 - 46 U/L Final   AST  Date Value Ref Range Status  01/08/2024 13 10 - 35 U/L Final   BUN  Date Value Ref Range Status  01/08/2024 20 7 - 25 mg/dL Final   Calcium   Date Value Ref Range Status  01/08/2024 9.3 8.6 - 10.3 mg/dL Final   CO2  Date Value Ref Range Status  01/08/2024 29 20 - 32 mmol/L Final   Creat  Date Value Ref Range Status  01/08/2024 0.85 0.70 - 1.30 mg/dL Final   Creatinine, Urine  Date Value Ref Range Status  01/16/2024 27 20 - 320 mg/dL Final   Glucose, Bld  Date Value Ref Range Status  01/08/2024 223 (H) 65 - 139 mg/dL Final    Comment:    .        Non-fasting reference interval .    Glucose-Capillary  Date Value Ref Range Status  01/03/2024 113 (H) 70 - 99 mg/dL Final    Comment:    Glucose reference range applies only to samples taken after fasting for at least 8 hours.   Potassium  Date Value Ref Range Status  01/08/2024 4.1 3.5 - 5.3 mmol/L Final   Sodium  Date Value Ref Range Status  01/08/2024 140 135 - 146 mmol/L Final   Total Bilirubin  Date Value Ref Range Status  01/08/2024 0.3 0.2 - 1.2 mg/dL Final   Bilirubin, Direct  Date Value Ref Range Status  07/28/2021 0.1 0.0 - 0.2 mg/dL Final   Indirect Bilirubin  Date Value Ref Range Status  07/28/2021 0.5 0.3 - 0.9 mg/dL Final    Comment:    Performed at [facility], [address] [place], [place] [address]   Protein, ur  Date Value Ref Range Status  03/10/2023 30 (A) NEGATIVE mg/dL Final   Total Protein  Date Value Ref Range Status  01/08/2024 5.9 (L) 6.1 - 8.1 g/dL Final   GFR, Est African American  Date  Value Ref Range Status  06/03/2019 118 > OR = 60 mL/min/1.20m2 Final   eGFR  Date Value Ref Range Status  01/08/2024 105 > OR = 60 mL/min/1.12m2 Final   GFR, Est Non African American  Date Value Ref  Range Status  06/03/2019 102 > OR = 60 mL/min/1.20m2 Final   GFR, Estimated  Date Value Ref Range Status  07/12/2023 >60 >60 mL/min Final    Comment:    (NOTE) Calculated using the CKD-EPI Creatinine Equation (2021)

## 2024-02-07 ENCOUNTER — Telehealth: Payer: Self-pay

## 2024-02-07 NOTE — Telephone Encounter (Signed)
 Copied from CRM [id number]. Topic: Medical Record Request - Provider/Facility Request >> Feb 07, 2024 11:19 AM Felicitas Horse wrote: Reason for CRM: Avanell Bob called in to notify that she will be sending a cardiac clearance for pt, pt has an upcoming surgery on 03/13/2024. If provider Dr. Linnell Richardson can sign and return based on instruction on fax.     Callback [phone] ext [phone] - secured line  - Avanell Bob

## 2024-02-07 NOTE — Telephone Encounter (Signed)
 Will defer to PCP upon his return. ?

## 2024-02-10 NOTE — Telephone Encounter (Signed)
 I have the form and it asks about his Plavix  and if he can hold it prior to surgery.  I reviewed the chart and I see that there is not a current order of Plavix . The notes say that he self discontinued Plavix  over a month ago.  Can you call patient to clarify if he is still taking Plavix  or if he stopped taking it?  If he is off of Plavix  entirely, then we can fill out the form and submit the pre-op clearance and he would be off Plavix  already before the surgery. If he is back on Plavix  or wants to restart it, then we would have to fill out the form to say he should HOLD Plavix  7 days prior to surgery.  Domingo Friend, DO [facility] 02/10/2024, 6:48 PM

## 2024-02-11 NOTE — Telephone Encounter (Signed)
 Tried calling patient no answer or VM, mailbox is full at this time.

## 2024-02-11 NOTE — Telephone Encounter (Signed)
 Called patient. He confirmed he is no longer on plavix . He has self discontinued it. I will complete his pre op paperwork and fax it in to Ortho  Domingo Friend, DO [facility] 02/11/2024, 7:06 PM

## 2024-02-18 ENCOUNTER — Other Ambulatory Visit: Payer: Self-pay | Admitting: Family Medicine

## 2024-02-18 DIAGNOSIS — G8929 Other chronic pain: Secondary | ICD-10-CM

## 2024-02-18 DIAGNOSIS — M1712 Unilateral primary osteoarthritis, left knee: Secondary | ICD-10-CM

## 2024-02-18 NOTE — Telephone Encounter (Unsigned)
 Copied from CRM [id number]. Topic: Clinical - Medication Refill >> Feb 18, 2024  1:54 PM Montie POUR wrote: Medication: HYDROcodone -acetaminophen  (NORCO/VICODIN) 5-325 MG tablet   Has the patient contacted their pharmacy? Yes (Agent: If no, request that the patient contact the pharmacy for the refill. If patient does not wish to contact the pharmacy document the reason why and proceed with request.) (Agent: If yes, when and what did the pharmacy advise?) Pharmacy needs order to refill  This is the patient's preferred pharmacy:  [facility] - [place], [place] - [address] [place] [address] Phone: [phone] Fax: [phone]  Is this the correct pharmacy for this prescription? Yes If no, delete pharmacy and type the correct one.   Has the prescription been filled recently? No  Is the patient out of the medication? Yes  Has the patient been seen for an appointment in the last year OR does the patient have an upcoming appointment? Yes  Can we respond through MyChart? Yes  Agent: Please be advised that Rx refills may take up to 3 business days. We ask that you follow-up with your pharmacy.

## 2024-02-19 ENCOUNTER — Other Ambulatory Visit: Payer: Self-pay | Admitting: Family Medicine

## 2024-02-19 DIAGNOSIS — G8929 Other chronic pain: Secondary | ICD-10-CM

## 2024-02-19 DIAGNOSIS — M47816 Spondylosis without myelopathy or radiculopathy, lumbar region: Secondary | ICD-10-CM | POA: Diagnosis not present

## 2024-02-19 DIAGNOSIS — M1712 Unilateral primary osteoarthritis, left knee: Secondary | ICD-10-CM

## 2024-02-19 NOTE — Telephone Encounter (Signed)
 Copied from CRM [id number]. Topic: Clinical - Medication Refill >> Feb 18, 2024  1:54 PM Montie POUR wrote: Medication: HYDROcodone -acetaminophen  (NORCO/VICODIN) 5-325 MG tablet   Has the patient contacted their pharmacy? Yes (Agent: If no, request that the patient contact the pharmacy for the refill. If patient does not wish to contact the pharmacy document the reason why and proceed with request.) (Agent: If yes, when and what did the pharmacy advise?) Pharmacy needs order to refill  This is the patient's preferred pharmacy:  [facility] - [place], [place] - [address] [place] [address] Phone: [phone] Fax: [phone]  Is this the correct pharmacy for this prescription? Yes If no, delete pharmacy and type the correct one.   Has the prescription been filled recently? No  Is the patient out of the medication? Yes  Has the patient been seen for an appointment in the last year OR does the patient have an upcoming appointment? Yes  Can we respond through MyChart? Yes  Agent: Please be advised that Rx refills may take up to 3 business days. We ask that you follow-up with your pharmacy. >> Feb 19, 2024  3:10 PM Zebedee SAUNDERS wrote: Pt called again for status on medication. I told pt that it could take up to 3 business days from 02/18/2024.

## 2024-02-20 ENCOUNTER — Telehealth: Payer: Self-pay

## 2024-02-20 ENCOUNTER — Ambulatory Visit: Payer: Self-pay | Admitting: *Deleted

## 2024-02-20 ENCOUNTER — Other Ambulatory Visit: Payer: Self-pay | Admitting: Family Medicine

## 2024-02-20 DIAGNOSIS — G8929 Other chronic pain: Secondary | ICD-10-CM

## 2024-02-20 DIAGNOSIS — M5030 Other cervical disc degeneration, unspecified cervical region: Secondary | ICD-10-CM

## 2024-02-20 DIAGNOSIS — M1712 Unilateral primary osteoarthritis, left knee: Secondary | ICD-10-CM

## 2024-02-20 MED ORDER — HYDROCODONE-ACETAMINOPHEN 5-325 MG PO TABS: 1.0000 | ORAL_TABLET | Freq: Four times a day (QID) | ORAL | 0 refills | Status: DC | PRN

## 2024-02-20 NOTE — Telephone Encounter (Signed)
 Copied from CRM [id number]. Topic: Clinical - Red Word Triage >> Feb 20, 2024  8:47 AM Berwyn MATSU wrote: Red Word that prompted transfer to Nurse Triage: pain Reason for Disposition  [1] Caller has URGENT medicine question about med that PCP or specialist prescribed AND [2] triager unable to answer question  Answer Assessment - Initial Assessment Questions 1. NAME of MEDICINE: What medicine(s) are you calling about?     I requested my pain medication.    I'm for surgery next month.   It's been 3 days since I asked.  I'm just wondering what is the deal with getting the pain medication My knee is hurting.    It's the Norco. 2. QUESTION: What is your question? (e.g., double dose of medicine, side effect)     I'm following up on my request.   It's been 3 days and I have not heard anything. 3. PRESCRIBER: Who prescribed the medicine? Reason: if prescribed by specialist, call should be referred to that group.     Dr. Edman. 4. SYMPTOMS: Do you have any symptoms? If Yes, ask: What symptoms are you having?  How bad are the symptoms (e.g., mild, moderate, severe)     Knee pain     For surgery on my knee 5. PREGNANCY:  Is there any chance that you are pregnant? When was your last menstrual period?     N/A  Protocols used: Medication Question Call-A-AH FYI Only or Action Required?: Action required by provider: medication refill request.  Patient was last seen in primary care on 01/22/2024 by Edman Marsa PARAS, DO. Called Nurse Triage reporting Medication Problem. Symptoms began several days ago. Interventions attempted: Prescription medications: Needs refill of Norco for his knee pain.    For knee surgery 03/13/2024.   This was requested 3 days ago.   He is following up because the refill has not been sent to pharmacy.  . Symptoms are: unchanged.  Triage Disposition: Call PCP Now  Patient/caregiver understands and will follow disposition?: Yes

## 2024-02-20 NOTE — Telephone Encounter (Signed)
 Copied from CRM [id number]. Topic: General - Other >> Feb 20, 2024  8:45 AM Berwyn MATSU wrote: Patient calling back to see what is going on with medication. Patient is requesting a call back.   May you please advise.

## 2024-02-20 NOTE — Telephone Encounter (Signed)
 Spoke with patient, notified refill has been sent in. He has a 3 month supply of gabapentin .

## 2024-02-20 NOTE — Addendum Note (Signed)
 Addended by: EDMAN MARSA PARAS on: 02/20/2024 09:11 AM   Modules accepted: Orders

## 2024-02-20 NOTE — Telephone Encounter (Signed)
 Please notify patient I have signed the order for his Hydrocodone  to [facility].  He is correct, there were 4 different refill encounters created since 02/18/24 and I am not quite sure the status of those. I did not receive the request until today.  Also I see he requested Gabapentin , however the order on 01/16/24 was for 800mg  gabapentin  4 per day 120 per bottle with +5 refills additional. He should check with pharmacy for refills on Gabapentin  800mg .  Marsa Officer, DO [facility] 02/20/2024, 9:11 AM

## 2024-03-02 ENCOUNTER — Other Ambulatory Visit: Payer: Self-pay | Admitting: Family Medicine

## 2024-03-02 DIAGNOSIS — M1712 Unilateral primary osteoarthritis, left knee: Secondary | ICD-10-CM

## 2024-03-02 DIAGNOSIS — G8929 Other chronic pain: Secondary | ICD-10-CM

## 2024-03-02 NOTE — Telephone Encounter (Signed)
 Copied from CRM [id number]. Topic: Clinical - Medication Refill >> Mar 02, 2024 11:40 AM Everette C wrote: Medication: HYDROcodone -acetaminophen  (NORCO/VICODIN) 5-325 MG tablet   Has the patient contacted their pharmacy? Yes (Agent: If no, request that the patient contact the pharmacy for the refill. If patient does not wish to contact the pharmacy document the reason why and proceed with request.) (Agent: If yes, when and what did the pharmacy advise?)  This is the patient's preferred pharmacy:  [facility] - [place], [place] - [address] [place] [address] Phone: [phone] Fax: [phone]  Is this the correct pharmacy for this prescription? Yes If no, delete pharmacy and type the correct one.   Has the prescription been filled recently? Yes  Is the patient out of the medication? Yes  Has the patient been seen for an appointment in the last year OR does the patient have an upcoming appointment? Yes  Pain Mgmt will not help the patient until after their procedure on 03/13/24  Can we respond through MyChart? No  Agent: Please be advised that Rx refills may take up to 3 business days. We ask that you follow-up with your pharmacy.

## 2024-03-03 ENCOUNTER — Encounter: Payer: Self-pay | Admitting: Family Medicine

## 2024-03-03 ENCOUNTER — Telehealth: Payer: Self-pay

## 2024-03-03 ENCOUNTER — Ambulatory Visit (INDEPENDENT_AMBULATORY_CARE_PROVIDER_SITE_OTHER): Admitting: Family Medicine

## 2024-03-03 VITALS — BP 138/92 | HR 85 | Ht 72.0 in | Wt 209.8 lb

## 2024-03-03 DIAGNOSIS — M1712 Unilateral primary osteoarthritis, left knee: Secondary | ICD-10-CM | POA: Diagnosis not present

## 2024-03-03 DIAGNOSIS — M5442 Lumbago with sciatica, left side: Secondary | ICD-10-CM | POA: Diagnosis not present

## 2024-03-03 DIAGNOSIS — G8929 Other chronic pain: Secondary | ICD-10-CM | POA: Insufficient documentation

## 2024-03-03 DIAGNOSIS — M5412 Radiculopathy, cervical region: Secondary | ICD-10-CM | POA: Insufficient documentation

## 2024-03-03 DIAGNOSIS — M5441 Lumbago with sciatica, right side: Secondary | ICD-10-CM

## 2024-03-03 DIAGNOSIS — M5030 Other cervical disc degeneration, unspecified cervical region: Secondary | ICD-10-CM | POA: Diagnosis not present

## 2024-03-03 DIAGNOSIS — M25562 Pain in left knee: Secondary | ICD-10-CM

## 2024-03-03 MED ORDER — HYDROCODONE-ACETAMINOPHEN 10-325 MG PO TABS
1.0000 | ORAL_TABLET | Freq: Four times a day (QID) | ORAL | 0 refills | Status: DC | PRN
Start: 2024-03-03 — End: 2024-03-16

## 2024-03-03 MED ORDER — HYDROCODONE-ACETAMINOPHEN 5-325 MG PO TABS: 1.0000 | ORAL_TABLET | Freq: Four times a day (QID) | ORAL | 0 refills | Status: DC | PRN

## 2024-03-03 NOTE — Progress Notes (Signed)
 Subjective:    Patient ID: Tyler Martinez, male    DOB: 1971-05-14, 53 y.o.   MRN: [id number]  Tyler Martinez is a 53 y.o. male presenting on 03/03/2024 for Back Pain and Knee Pain   HPI  Discussed the use of AI scribe software for clinical note transcription with the patient, who gave verbal consent to proceed.  History of Present Illness   Tyler Martinez is a 53 year old male who presents for pain management and upcoming knee surgery.  Chronic knee pain - Chronic knee pain persisting for years Followed by [facility] intra-articular injections  - Presence of two pieces of bone protruding in the knee, restricting ability to bend - Scheduled for knee surgery on July 9th, 2025  Chronic back pain with radiculopathy - Significant back pain ongoing for months - Episodes of severe pain lasting up to a week - Pain radiates from back to arms, shoulders, hips, toes, and fingers - X-ray of the back performed - Upcoming appointment with spine specialist Dr Frederic Don  Opioid analgesic use and pain management - Currently taking hydrocodone  5-325 mg, one to two tablets as needed - Prefers hydrocodone  10 mg for more effective pain control to avoid double dose of previous med - Previous experiences with pain management clinics, dissatisfied with his services - No current appointment with pain management, plans to pursue after knee surgery  Functional impairment and psychosocial impact - Pain severity impacts concentration at work and daily activities - Experiences frustration and difficulty managing responsibilities due to pain          03/03/2024    1:57 PM 01/16/2024    9:16 AM 11/07/2023   10:47 AM  Depression screen PHQ 2/9  Decreased Interest 3 1 1   Down, Depressed, Hopeless 1 1 1   PHQ - 2 Score 4 2 2   Altered sleeping 0 1 1  Tired, decreased energy 1 1 1   Change in appetite 0 1 0  Feeling bad or failure about yourself  0 0 0  Trouble  concentrating 3 1 1   Moving slowly or fidgety/restless 0 1 0  Suicidal thoughts 0 0 0  PHQ-9 Score 8 7 5   Difficult doing work/chores Extremely dIfficult Somewhat difficult Very difficult       03/03/2024    1:57 PM 01/16/2024    9:17 AM 11/07/2023   10:47 AM 10/15/2023    3:19 PM  GAD 7 : Generalized Anxiety Score  Nervous, Anxious, on Edge 1 1 0 0  Control/stop worrying 0 1 0 1  Worry too much - different things 2 1 0 0  Trouble relaxing 3 0 0 0  Restless 2 1 0   Easily annoyed or irritable 1 1 0 0  Afraid - awful might happen 1 0 0 0  Total GAD 7 Score 10 5 0   Anxiety Difficulty Somewhat difficult       Social History   Tobacco Use   Smoking status: Every Day    Current packs/day: 1.00    Average packs/day: 1 pack/day for 8.0 years (8.0 ttl pk-yrs)    Types: Cigarettes   Smokeless tobacco: Never  Vaping Use   Vaping status: Never Used  Substance Use Topics   Alcohol use: Not Currently    Alcohol/week: 2.0 standard drinks of alcohol    Types: 2 Standard drinks or equivalent per week    Comment: Drinks vodka every evening   Drug use: Not Currently  Types: Amphetamines    Review of Systems Per HPI unless specifically indicated above     Objective:    BP (!) 138/92 (BP Location: Left Arm, Cuff Size: Normal)   Pulse 85   Ht 6' (1.829 m)   Wt 209 lb 12.8 oz (95.2 kg)   SpO2 94%   BMI 28.45 kg/m   Wt Readings from Last 3 Encounters:  03/03/24 209 lb 12.8 oz (95.2 kg)  01/22/24 207 lb 4 oz (94 kg)  01/16/24 201 lb (91.2 kg)    Physical Exam Vitals and nursing note reviewed.  Constitutional:      General: He is not in acute distress.    Appearance: Normal appearance. He is well-developed. He is not diaphoretic.     Comments: Well-appearing, comfortable, cooperative  HENT:     Head: Normocephalic and atraumatic.  Eyes:     General:        Right eye: No discharge.        Left eye: No discharge.     Conjunctiva/sclera: Conjunctivae normal.   Cardiovascular:     Rate and Rhythm: Normal rate.  Pulmonary:     Effort: Pulmonary effort is normal.  Musculoskeletal:     Comments: Left Knee with bulky appearance some crepitus on motion and joint line pain.  Skin:    General: Skin is warm and dry.     Findings: No erythema or rash.  Neurological:     Mental Status: He is alert and oriented to person, place, and time.  Psychiatric:        Mood and Affect: Mood normal.        Behavior: Behavior normal.        Thought Content: Thought content normal.     Comments: Well groomed, good eye contact, normal speech and thoughts     Results for orders placed or performed in visit on 01/16/24  Urine Microalbumin w/creat. ratio   Collection Time: 01/16/24  9:16 AM  Result Value Ref Range   Creatinine, Urine 27 20 - 320 mg/dL   Microalb, Ur 0.3 mg/dL   Microalb Creat Ratio 11 <30 mg/g creat      Assessment & Plan:   Problem List Items Addressed This Visit     Cervical radiculitis   Relevant Orders   Ambulatory referral to Pain Clinic   Chronic bilateral low back pain with bilateral sciatica   Relevant Medications   HYDROcodone -acetaminophen  (NORCO) 10-325 MG tablet   Other Relevant Orders   Ambulatory referral to Pain Clinic   Chronic pain of left knee - Primary   Relevant Medications   HYDROcodone -acetaminophen  (NORCO) 10-325 MG tablet   Other Relevant Orders   Ambulatory referral to Pain Clinic   DDD (degenerative disc disease), cervical   Relevant Medications   HYDROcodone -acetaminophen  (NORCO) 10-325 MG tablet   Other Relevant Orders   Ambulatory referral to Pain Clinic   Primary osteoarthritis of left knee   Relevant Medications   HYDROcodone -acetaminophen  (NORCO) 10-325 MG tablet   Other Relevant Orders   Ambulatory referral to Pain Clinic     Chronic Joint Pain / Knee Pain / Advanced Osteoarthritis Followed by [facility]  Chronic knee pain - Proceed with knee surgery on July 9th. - Initiate  post-surgical physical therapy. - On opioid therapy from PCP  Chronic Back Pain Chronic back pain with nerve involvement affecting multiple areas. Suspected referred pain. Spine specialist appointment on July 14th. Also w [facility] - Dose increase hydrocodone  5/325 up to  10/325 mg, up to every six hours as needed. - Appointment with spine specialist Frederic Don on July 14th. - Referral to [facility] for pain management post-surgery.  Pain Management Current pain management inadequate. Prefers primary care provider until post-knee surgery. Open to [facility] procedural options post-surgery. - Prescribe hydrocodone  10 mg, up to every six hours as needed, total of 56 pills for 14 days. - Coordinate refills and monitor pain management needs. - Referral to [facility] for pain management procedures post-surgery.       Orders Placed This Encounter  Procedures   Ambulatory referral to Pain Clinic    Referral Priority:   Routine    Referral Type:   Consultation    Referral Reason:   Specialty Services Required    Requested Specialty:   Pain Medicine    Number of Visits Requested:   1    Meds ordered this encounter  Medications   HYDROcodone -acetaminophen  (NORCO) 10-325 MG tablet    Sig: Take 1 tablet by mouth every 6 (six) hours as needed.    Dispense:  56 tablet    Refill:  0    Dose adjustment    Follow up plan: Return if symptoms worsen or fail to improve.  Marsa Officer, DO [facility] 03/03/2024, 2:16 PM

## 2024-03-03 NOTE — Telephone Encounter (Signed)
 Copied from CRM [id number]. Topic: Clinical - Medication Refill >> Mar 03, 2024  9:59 AM Mesmerise C wrote: Patient requesting his Hydrocodone  be filled today as soon as possible cause he's almost out he was advised of 3 business days

## 2024-03-03 NOTE — Patient Instructions (Addendum)
 Thank you for coming to the office today.  Re ordered Hydrocodone  now at 10mg , up to every 6 hours as needed, 56 pills for 14 days.  We will coordinate refills for now and do referral.  [facility] Address: [address] [place], [place], [place] [address] Phone: [phone]  Please schedule a Follow-up Appointment to: Return if symptoms worsen or fail to improve.  If you have any other questions or concerns, please feel free to call the office or send a message through MyChart. You may also schedule an earlier appointment if necessary.  Additionally, you may be receiving a survey about your experience at our office within a few days to 1 week by e-mail or mail. We value your feedback.  Marsa Officer, DO [facility], [place]

## 2024-03-03 NOTE — Telephone Encounter (Signed)
 Please notify patient Refill sent to [facility]  Marsa Officer, DO [facility] 03/03/2024, 10:17 AM

## 2024-03-09 DIAGNOSIS — M47816 Spondylosis without myelopathy or radiculopathy, lumbar region: Secondary | ICD-10-CM | POA: Diagnosis not present

## 2024-03-12 ENCOUNTER — Ambulatory Visit: Admitting: Internal Medicine

## 2024-03-12 ENCOUNTER — Other Ambulatory Visit: Payer: Self-pay | Admitting: Family Medicine

## 2024-03-12 DIAGNOSIS — G8929 Other chronic pain: Secondary | ICD-10-CM

## 2024-03-12 DIAGNOSIS — M1712 Unilateral primary osteoarthritis, left knee: Secondary | ICD-10-CM

## 2024-03-12 DIAGNOSIS — M5030 Other cervical disc degeneration, unspecified cervical region: Secondary | ICD-10-CM

## 2024-03-13 ENCOUNTER — Other Ambulatory Visit: Payer: Self-pay

## 2024-03-13 ENCOUNTER — Other Ambulatory Visit: Payer: Self-pay | Admitting: Internal Medicine

## 2024-03-13 DIAGNOSIS — Z8673 Personal history of transient ischemic attack (TIA), and cerebral infarction without residual deficits: Secondary | ICD-10-CM | POA: Diagnosis not present

## 2024-03-13 DIAGNOSIS — M898X6 Other specified disorders of bone, lower leg: Secondary | ICD-10-CM | POA: Diagnosis not present

## 2024-03-13 DIAGNOSIS — M948X6 Other specified disorders of cartilage, lower leg: Secondary | ICD-10-CM | POA: Diagnosis not present

## 2024-03-13 DIAGNOSIS — Z955 Presence of coronary angioplasty implant and graft: Secondary | ICD-10-CM | POA: Diagnosis not present

## 2024-03-13 DIAGNOSIS — Z7984 Long term (current) use of oral hypoglycemic drugs: Secondary | ICD-10-CM | POA: Diagnosis not present

## 2024-03-13 DIAGNOSIS — M94262 Chondromalacia, left knee: Secondary | ICD-10-CM | POA: Diagnosis not present

## 2024-03-13 DIAGNOSIS — M89352 Hypertrophy of bone, left femur: Secondary | ICD-10-CM | POA: Diagnosis not present

## 2024-03-13 NOTE — Telephone Encounter (Signed)
 Patient is currently having surgery.  Advised him since he got oxycodone  he does not need the hydrocodone  until next week.

## 2024-03-13 NOTE — Telephone Encounter (Signed)
 Requested medications are due for refill today.  Pt was given a 2 week supply on 03/03/2024 .  Requested medications are on the active medications list.  yes  Last refill. 03/03/2024 #56 0 rf  Future visit scheduled.   yes  Notes to clinic.  Refill not delegated.    Requested Prescriptions  Pending Prescriptions Disp Refills   HYDROcodone -acetaminophen  (NORCO) 10-325 MG tablet [Pharmacy Med Name: HYDROCODONE -APAP 10-325 MG TAB] 56 tablet     Sig: TAKE 1 TABLET BY MOUTH EVERY 6 HOURS AS NEEDED FOR PAIN     Not Delegated - Analgesics:  Opioid Agonist Combinations Failed - 03/13/2024 12:33 PM      Failed - This refill cannot be delegated      Failed - Urine Drug Screen completed in last 360 days      Passed - Valid encounter within last 3 months    Recent Outpatient Visits           1 week ago Chronic pain of left knee   [place] [facility] Ed[facility] Marsa PARAS, DO   1 month ago Chronic pain of left knee   [place] [facility] Ed[facility] Marsa PARAS, DO   1 month ago Annual physical exam   [place] [facility] Marsa PARAS, DO   4 months ago Chronic pain of left knee   [facility] Ed[facility] Marsa PARAS, DO   5 months ago Unintentional weight loss   [place] [facility] [place], Marsa PARAS, DO       Future Appointments             In 3 months Edman, Marsa PARAS, DO [facility], [facility]

## 2024-03-13 NOTE — Telephone Encounter (Signed)
 Please call patient for follow-up.  I believe he had surgery today and he has oxycodone  given to him by his surgeon to take through the weekend until PCP can return to fill hydrocodone .

## 2024-03-16 ENCOUNTER — Telehealth: Payer: Self-pay

## 2024-03-16 ENCOUNTER — Other Ambulatory Visit: Payer: Self-pay | Admitting: Family Medicine

## 2024-03-16 DIAGNOSIS — M1712 Unilateral primary osteoarthritis, left knee: Secondary | ICD-10-CM

## 2024-03-16 DIAGNOSIS — M5030 Other cervical disc degeneration, unspecified cervical region: Secondary | ICD-10-CM

## 2024-03-16 DIAGNOSIS — G8929 Other chronic pain: Secondary | ICD-10-CM

## 2024-03-16 MED ORDER — HYDROCODONE-ACETAMINOPHEN 10-325 MG PO TABS: 1.0000 | ORAL_TABLET | Freq: Four times a day (QID) | ORAL | 0 refills | Status: DC | PRN

## 2024-03-16 NOTE — Addendum Note (Signed)
 Addended by: EDMAN MARSA PARAS on: 03/16/2024 12:53 PM   Modules accepted: Orders

## 2024-03-16 NOTE — Telephone Encounter (Signed)
 Please let him know I have signed the refill for his pain medicine. He should be aware already that picking up and filling other pain rx from other doctors such as specialist can delay or interfere with the pharmacy filling schedule. He should follow up now with pharmacy next  Marsa Officer, DO [facility] 03/16/2024, 12:52 PM

## 2024-03-16 NOTE — Telephone Encounter (Signed)
 Copied from CRM [id number]. Topic: Clinical - Prescription Issue >> Mar 16, 2024 11:40 AM Deleta RAMAN wrote: Reason for CRM: patient is calling due to medication refilled being denied by pcp.He is due for a refill on the following medication hydrocodone  10-325 mg table. Patient has been without the medication for 2 days. He wants to know if he has to schedule an appointment for the refill or needs authorization. You can reach him at [phone]

## 2024-03-16 NOTE — Telephone Encounter (Signed)
 Patient notified prescription called in, but may have interference at pharmacy.

## 2024-03-16 NOTE — Telephone Encounter (Unsigned)
 Copied from CRM [id number]. Topic: Clinical - Medication Refill >> Mar 16, 2024  9:48 AM Everette C wrote: Medication: HYDROcodone -acetaminophen  (NORCO) 10-325 MG tablet [[id number]]  Has the patient contacted their pharmacy? Yes (Agent: If no, request that the patient contact the pharmacy for the refill. If patient does not wish to contact the pharmacy document the reason why and proceed with request.) (Agent: If yes, when and what did the pharmacy advise?)  This is the patient's preferred pharmacy:  [facility] - [place], [place] - [address] [place] [address] Phone: [phone] Fax: [phone]  Is this the correct pharmacy for this prescription? No If no, delete pharmacy and type the correct one.   Has the prescription been filled recently? Yes  Is the patient out of the medication? Yes  Has the patient been seen for an appointment in the last year OR does the patient have an upcoming appointment? Yes  Can we respond through MyChart? No  Agent: Please be advised that Rx refills may take up to 3 business days. We ask that you follow-up with your pharmacy.

## 2024-03-16 NOTE — Telephone Encounter (Signed)
 FYI Only or Action Required?: Action required by provider: medication refill request.  Patient was last seen in primary care on 03/03/2024 by Edman Marsa PARAS, DO.  Called Nurse Triage reporting Medication Refill.  Symptoms began today.  Interventions attempted: Nothing.  Symptoms are: stable.  Triage Disposition: med refill  Patient/caregiver understands and will follow disposition?:

## 2024-03-26 ENCOUNTER — Other Ambulatory Visit: Payer: Self-pay | Admitting: Family Medicine

## 2024-03-26 DIAGNOSIS — M1712 Unilateral primary osteoarthritis, left knee: Secondary | ICD-10-CM

## 2024-03-26 DIAGNOSIS — M5030 Other cervical disc degeneration, unspecified cervical region: Secondary | ICD-10-CM

## 2024-03-26 DIAGNOSIS — G8929 Other chronic pain: Secondary | ICD-10-CM

## 2024-03-26 NOTE — Telephone Encounter (Signed)
 Requested medications are due for refill today.  no  Requested medications are on the active medications list.  yes  Last refill. 03/16/2024 #56 0 rf  Future visit scheduled.   yes  Notes to clinic.  Refill not delegated.    Requested Prescriptions  Pending Prescriptions Disp Refills   HYDROcodone -acetaminophen  (NORCO) 10-325 MG tablet [Pharmacy Med Name: HYDROCODONE -APAP 10-325 MG TAB] 56 tablet     Sig: TAKE 1 TABLET BY MOUTH EVERY 6 HOURS AS NEEDED     Not Delegated - Analgesics:  Opioid Agonist Combinations Failed - 03/26/2024  5:20 PM      Failed - This refill cannot be delegated      Failed - Urine Drug Screen completed in last 360 days      Passed - Valid encounter within last 3 months    Recent Outpatient Visits           3 weeks ago Chronic pain of left knee   [facility] PAR[facility], DO   2 months ago Chronic pain of left knee   [facility] Marsa PARAS, DO   2 months ago Annual physical exam   [place] [facility]a PARAS, DO   4 months ago Chronic pain of left knee   [facility] Marsa PARAS, DO   5 months ago Unintentional weight loss   [place] [facility] [place], Marsa PARAS, DO       Future Appointments             In 3 months Edman, Marsa PARAS, DO [place] [facility], [facility]

## 2024-04-02 ENCOUNTER — Ambulatory Visit: Admitting: Student in an Organized Health Care Education/Training Program

## 2024-04-03 DIAGNOSIS — M47816 Spondylosis without myelopathy or radiculopathy, lumbar region: Secondary | ICD-10-CM | POA: Diagnosis not present

## 2024-04-07 DIAGNOSIS — M47816 Spondylosis without myelopathy or radiculopathy, lumbar region: Secondary | ICD-10-CM | POA: Diagnosis not present

## 2024-04-08 ENCOUNTER — Telehealth: Payer: Self-pay

## 2024-04-08 ENCOUNTER — Other Ambulatory Visit: Payer: Self-pay | Admitting: Family Medicine

## 2024-04-08 DIAGNOSIS — M1712 Unilateral primary osteoarthritis, left knee: Secondary | ICD-10-CM

## 2024-04-08 DIAGNOSIS — M5030 Other cervical disc degeneration, unspecified cervical region: Secondary | ICD-10-CM

## 2024-04-08 DIAGNOSIS — G8929 Other chronic pain: Secondary | ICD-10-CM

## 2024-04-08 DIAGNOSIS — F5104 Psychophysiologic insomnia: Secondary | ICD-10-CM

## 2024-04-08 MED ORDER — QUETIAPINE FUMARATE 50 MG PO TABS: 50.0000 mg | ORAL_TABLET | Freq: Every day | ORAL | 0 refills | Status: DC

## 2024-04-08 NOTE — Telephone Encounter (Signed)
 Refill hydrocodone  to [facility] with patient notified prescription of  seroquel  has been sent in for him to try for 30 days. He has an appointment already scheduled.

## 2024-04-08 NOTE — Telephone Encounter (Signed)
 Please let him know othat I sent new dose of Seroquel  50mg  nightly for him to try for 30 days.  If this is still not successful, then I would consider switching and considering alternative options, we should schedule follow-up within 30 days if he is not improving or calls back.  We can consider Trazodone  as next option.  Marsa Officer, DO [facility] 04/08/2024, 1:21 PM

## 2024-04-08 NOTE — Telephone Encounter (Signed)
 Copied from CRM #[id number]. Topic: Clinical - Medication Refill >> Apr 08, 2024  8:31 AM Tyler Martinez wrote: Medication: hydrocodone   Has the patient contacted their pharmacy? No (Agent: If no, request that the patient contact the pharmacy for the refill. If patient does not wish to contact the pharmacy document the reason why and proceed with request.) (Agent: If yes, when and what did the pharmacy advise?)  This is the patient's preferred pharmacy:  [facility] - [place], [place] - [address] [place] [address] Phone: [phone] Fax: [phone]  Is this the correct pharmacy for this prescription? Yes If no, delete pharmacy and type the correct one.   Has the prescription been filled recently? Yes  Is the patient out of the medication? No  Has the patient been seen for an appointment in the last year OR does the patient have an upcoming appointment? Yes  Can we respond through MyChart? Yes  Agent: Please be advised that Rx refills may take up to 3 business days. We ask that you follow-up with your pharmacy.

## 2024-04-08 NOTE — Addendum Note (Signed)
 Addended by: EDMAN MARSA PARAS on: 04/08/2024 01:21 PM   Modules accepted: Orders

## 2024-04-08 NOTE — Telephone Encounter (Signed)
 Copied from CRM [id number]. Topic: General - Other >> Apr 08, 2024  8:26 AM Rosina BIRCH wrote: Reason for CRM: patient called stating he need a stronger  QUEtiapine  (SEROQUEL )  because he is taking more of it and he  is running out. Patient states he is taking 25mg  and patient states he can not sleep CB [phone]

## 2024-04-09 ENCOUNTER — Other Ambulatory Visit: Payer: Self-pay | Admitting: Family Medicine

## 2024-04-09 ENCOUNTER — Telehealth: Payer: Self-pay | Admitting: Family Medicine

## 2024-04-09 DIAGNOSIS — I2510 Atherosclerotic heart disease of native coronary artery without angina pectoris: Secondary | ICD-10-CM

## 2024-04-09 MED ORDER — CLOPIDOGREL BISULFATE 75 MG PO TABS: 75.0000 mg | ORAL_TABLET | Freq: Every day | ORAL | 1 refills | Status: AC

## 2024-04-09 NOTE — Telephone Encounter (Signed)
 Prescription Request  04/09/2024  LOV: 03/03/2024  What is the name of the medication or equipment? PLAVIX , HYDROCODONE   Have you contacted your pharmacy to request a refill? No   Which pharmacy would you like this sent to?  [facility] - [place], [place] - [address] [place] [address] Phone: [phone] Fax: [phone]    Patient notified that their request is being sent to the clinical staff for review and that they should receive a response within 2 business days.   Please advise at Mobile [phone] (mobile)

## 2024-04-09 NOTE — Telephone Encounter (Signed)
 Both refills sent. FYI for Hydrocodone  the next fill date is suggested to be Monday 8/18, that is 14 days since last fill 03/30/24. However I sent it for pick up Friday 8/15, it is up to the pharmacy to determine earliest date they can fill it. He should check with [facility] starting tomorrow  Marsa Officer, DO [facility] 04/09/2024, 3:36 PM

## 2024-04-09 NOTE — Telephone Encounter (Signed)
 Copied from CRM #[id number]. Topic: Clinical - Medication Refill >> Apr 09, 2024 12:50 PM DeAngela L wrote: Medication: clopidogrel  (PLAVIX ) 75 MG tablet   Has the patient contacted their pharmacy? Yes  (Agent: If no, request that the patient contact the pharmacy for the refill. If patient does not wish to contact the pharmacy document the reason why and proceed with request.) (Agent: If yes, when and what did the pharmacy advise?)  This is the patient's preferred pharmacy:  [facility] - [place], [place] - [address] [place] [address] Phone: [phone] Fax: [phone]  Is this the correct pharmacy for this prescription? Yes  If no, delete pharmacy and type the correct one.   Has the prescription been filled recently? Yes   Is the patient out of the medication? Yes   Has the patient been seen for an appointment in the last year OR does the patient have an upcoming appointment? Yes   Can we respond through MyChart? No   Agent: Please be advised that Rx refills may take up to 3 business days. We ask that you follow-up with your pharmacy.

## 2024-04-10 DIAGNOSIS — M47816 Spondylosis without myelopathy or radiculopathy, lumbar region: Secondary | ICD-10-CM | POA: Diagnosis not present

## 2024-04-10 DIAGNOSIS — M546 Pain in thoracic spine: Secondary | ICD-10-CM | POA: Diagnosis not present

## 2024-04-10 DIAGNOSIS — M542 Cervicalgia: Secondary | ICD-10-CM | POA: Diagnosis not present

## 2024-04-10 NOTE — Telephone Encounter (Signed)
 Requested medication (s) are due for refill today: no  Requested medication (s) are on the active medication list: yes  Last refill:  04/09/24  Future visit scheduled: yes  Notes to clinic:  unable to refuse, routing for refusal     Requested Prescriptions  Pending Prescriptions Disp Refills   HYDROcodone -acetaminophen  (NORCO) 10-325 MG tablet 56 tablet 0    Sig: Take 1 tablet by mouth every 6 (six) hours as needed.     Not Delegated - Analgesics:  Opioid Agonist Combinations Failed - 04/10/2024  1:39 PM      Failed - This refill cannot be delegated      Failed - Urine Drug Screen completed in last 360 days      Passed - Valid encounter within last 3 months    Recent Outpatient Visits           1 month ago Chronic pain of left knee   [facility] [place], Marsa PARAS, DO   2 months ago Chronic pain of left knee   [place] [facility] Marsa PARAS, DO   2 months ago Annual physical exam   [place] [facility] Marsa PARAS, DO   5 months ago Chronic pain of left knee   [facility] Marsa PARAS, DO   5 months ago Unintentional weight loss   [place] [facility], Marsa PARAS, DO       Future Appointments             In 2 months Edman, Marsa PARAS, DO [place] [facility], [facility]

## 2024-04-10 NOTE — Telephone Encounter (Signed)
 Provider not at this practice

## 2024-04-13 ENCOUNTER — Ambulatory Visit: Admitting: Family Medicine

## 2024-04-17 ENCOUNTER — Encounter: Payer: Self-pay | Admitting: Family Medicine

## 2024-04-17 ENCOUNTER — Ambulatory Visit (INDEPENDENT_AMBULATORY_CARE_PROVIDER_SITE_OTHER): Admitting: Family Medicine

## 2024-04-17 VITALS — BP 118/76 | HR 80 | Ht 72.0 in | Wt 210.4 lb

## 2024-04-17 DIAGNOSIS — G8929 Other chronic pain: Secondary | ICD-10-CM | POA: Diagnosis not present

## 2024-04-17 DIAGNOSIS — M5441 Lumbago with sciatica, right side: Secondary | ICD-10-CM

## 2024-04-17 DIAGNOSIS — M5030 Other cervical disc degeneration, unspecified cervical region: Secondary | ICD-10-CM | POA: Diagnosis not present

## 2024-04-17 DIAGNOSIS — M5442 Lumbago with sciatica, left side: Secondary | ICD-10-CM | POA: Diagnosis not present

## 2024-04-17 DIAGNOSIS — G9581 Conus medullaris syndrome: Secondary | ICD-10-CM | POA: Diagnosis not present

## 2024-04-17 MED ORDER — PREDNISONE 20 MG PO TABS: ORAL_TABLET | ORAL | 0 refills | Status: DC

## 2024-04-17 NOTE — Progress Notes (Signed)
 Subjective:    Patient ID: Tyler Martinez, male    DOB: [birth year], 53 y.o.   MRN: [id number]  Tyler Martinez is a 53 y.o. male presenting on 04/17/2024 for Back Pain   HPI  Discussed the use of AI scribe software for clinical note transcription with the patient, who gave verbal consent to proceed.  History of Present Illness   Tyler Martinez is a 53 year old male who presents with severe back pain and functional limitations due to a spinal lesion.  Severe back pain and neurological symptoms - Severe back pain localized to the mid-lumbar and sacral region, specifically at L4, L5, and S1 vertebrae - Pain radiates down both arms and legs, worsened by improper sitting posture - Significant functional impairment, including missed workdays and exacerbation of symptoms after physical exertion (e.g., pushing a truck) - Pain significantly impacts ability to work and daily activities  Followed by [facility] Dr Dow Spine specialist  Spinal lesion - Hyperintense lesion on MRI involving the conus, left side, measuring approximately six centimeters - Lesion suspected to be either a tumor or cyst; malignancy not yet ruled out - Awaiting further evaluation and additional MRIs to determine lesion etiology and cancer risk  Pain management and medication use - Current pain management regimen includes muscle relaxers (ineffective) and hydrocodone  10/325 mg per day  up to 4 pills per day for 2 week supply (perceived as insufficient) - Takes more than the prescribed hydrocodone  dose to manage pain - Previously trialed prednisone  with temporary relief; open to retrying prednisone  for symptom control - Expresses frustration with inadequate pain control and the financial burden of ongoing pain management and potential surgery  Concerns regarding mobility and surgical intervention - Concerned about the potential need for surgery and its impact on mobility, particularly risk of losing  function in his legs - Awaiting further imaging and evaluation to determine appropriate treatment plan     Chronic knee pain - Chronic knee pain persisting for years Followed by [facility] Dr Gini - Receives intra-articular injections       04/17/2024   10:22 AM 03/03/2024    1:57 PM 01/16/2024    9:16 AM  Depression screen PHQ 2/9  Decreased Interest 1 3 1   Down, Depressed, Hopeless 0 1 1  PHQ - 2 Score 1 4 2   Altered sleeping 1 0 1  Tired, decreased energy 0 1 1  Change in appetite 1 0 1  Feeling bad or failure about yourself  0 0 0  Trouble concentrating 0 3 1  Moving slowly or fidgety/restless 0 0 1  Suicidal thoughts 0 0 0  PHQ-9 Score 3 8 7   Difficult doing work/chores  Extremely dIfficult Somewhat difficult       04/17/2024   10:21 AM 03/03/2024    1:57 PM 01/16/2024    9:17 AM 11/07/2023   10:47 AM  GAD 7 : Generalized Anxiety Score  Nervous, Anxious, on Edge 1 1 1  0  Control/stop worrying 1 0 1 0  Worry too much - different things 1 2 1  0  Trouble relaxing 0 3 0 0  Restless 0 2 1 0  Easily annoyed or irritable 0 1 1 0  Afraid - awful might happen 0 1 0 0  Total GAD 7 Score 3 10 5  0  Anxiety Difficulty  Somewhat difficult      Social History   Tobacco Use   Smoking status: Every Day    Current packs/day:  1.00    Average packs/day: 1 pack/day for 8.0 years (8.0 ttl pk-yrs)    Types: Cigarettes   Smokeless tobacco: Never  Vaping Use   Vaping status: Never Used  Substance Use Topics   Alcohol use: Not Currently    Alcohol/week: 2.0 standard drinks of alcohol    Types: 2 Standard drinks or equivalent per week    Comment: Drinks vodka every evening   Drug use: Not Currently    Types: Amphetamines    Review of Systems Per HPI unless specifically indicated above     Objective:    BP 118/76   Pulse 80   Ht 6' (1.829 m)   Wt 210 lb 6 oz (95.4 kg)   SpO2 93%   BMI 28.53 kg/m   Wt Readings from Last 3 Encounters:  04/17/24 210 lb 6 oz (95.4 kg)   03/03/24 209 lb 12.8 oz (95.2 kg)  01/22/24 207 lb 4 oz (94 kg)    Physical Exam Vitals and nursing note reviewed.  Constitutional:      General: He is not in acute distress.    Appearance: Normal appearance. He is well-developed. He is not diaphoretic.     Comments: Well-appearing, comfortable, cooperative  HENT:     Head: Normocephalic and atraumatic.  Eyes:     General:        Right eye: No discharge.        Left eye: No discharge.     Conjunctiva/sclera: Conjunctivae normal.  Cardiovascular:     Rate and Rhythm: Normal rate.  Pulmonary:     Effort: Pulmonary effort is normal.  Musculoskeletal:     Comments: Low Back with some reduced range of motion and pain with flexion and extension. Some reproduced radiating pain into legs with straight leg raise.  Left Knee with bulky appearance some crepitus on motion and joint line pain.  Skin:    General: Skin is warm and dry.     Findings: No erythema or rash.  Neurological:     Mental Status: He is alert and oriented to person, place, and time.  Psychiatric:        Mood and Affect: Mood normal.        Behavior: Behavior normal.        Thought Content: Thought content normal.     Comments: Well groomed, good eye contact, normal speech and thoughts     04/10/2024 04/10/2024 Diagnostic Studies: MRI demonstrates a hyperintense lesion of the conus. Left-sided disc herniation at L5-S1.  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 MRI thoracic, cervical, and lumbar spine with contrast 2. MRI thoracic and cervical spine without contrast     Results for orders placed or performed in visit on 01/16/24  Urine Microalbumin w/creat. ratio   Collection Time: 01/16/24  9:16 AM  Result Value Ref Range   Creatinine, Urine 27 20 - 320 mg/dL   Microalb, Ur 0.3 mg/dL   Microalb Creat Ratio 11 <30 mg/g creat      Assessment & Plan:   Problem List Items Addressed This Visit      Chronic bilateral low back pain with bilateral sciatica - Primary   Relevant Medications   predniSONE  (DELTASONE ) 20 MG tablet   DDD (degenerative disc disease), cervical   Other Visit Diagnoses       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(HCC)            Chronic Back Pain Lumbar DDD /  Sciatica / Spinal Disease Chronic back pain with nerve involvement affecting multiple areas. Suspected referred pain Followed by [facility] / Dr Dow  Awaiting further imaging, had initial MRI concern for neuroendocrine tumor or lesion on conus medullaris  - He expresses concern for need for increased pain control. I advised him that we have tried referring to pain management and he is not able to follow with them due to cost and also referral appears to have been denied.  I have him currently on hydrocodone  10/325 mg, up to every six hours as needed. Up to max 4 per day for 2 week supply, and he is needing more   My goal primarily was to manage his knee pain prior to knee surgery.  Question back pain factor now. We discussed that he would need more definitive management from spine specialist going forward. 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 Differential includes neuroendocrine tumor or cyst, malignancy unlikely. - They have already ordered additional MRI imaging  - Prescribe week-long prednisone  taper for nerve pain. - Continue hydrocodone , consider dosage increase if pain persists by next week, he will contact office. Max course on current Hydrocodone  is limited to 6 per day we would adjust his pill count if need otherwise, he will need to seek care for pain management at other location for longer term management beyond that dose. - Coordinate with Dr. Dow for further management post-MRI.        No  orders of the defined types were placed in this encounter.   Meds ordered this encounter  Medications   predniSONE  (DELTASONE ) 20 MG tablet    Sig: Take daily with food. Start with 60mg  (3 pills) x 2 days, then reduce to 40mg  (2 pills) x 2 days, then 20mg  (1 pill) x 3 days    Dispense:  13 tablet    Refill:  0    Follow up plan: Return if symptoms worsen or fail to improve.   Marsa Officer, DO [facility] 04/17/2024, 10:38 AM

## 2024-04-17 NOTE — Patient Instructions (Addendum)
 Thank you for coming to the office today.  Start Prednisone  taper 7 days for spinal nerve pain.  Keep on Hydrocodone  current dose  Call Monday or Tuesday to update if prednisone  is working well or if we need to increase pill count on Hydrocodone . Next due fill on Friday next week.  Keep with Ortho Dr Dow  Please schedule a Follow-up Appointment to: Return if symptoms worsen or fail to improve.  If you have any other questions or concerns, please feel free to call the office or send a message through MyChart. You may also schedule an earlier appointment if necessary.  Additionally, you may be receiving a survey about your experience at our office within a few days to 1 week by e-mail or mail. We value your feedback.  Marsa Officer, DO [facility], [place]

## 2024-04-20 ENCOUNTER — Other Ambulatory Visit: Payer: Self-pay | Admitting: Family Medicine

## 2024-04-20 DIAGNOSIS — M1712 Unilateral primary osteoarthritis, left knee: Secondary | ICD-10-CM

## 2024-04-20 DIAGNOSIS — M47816 Spondylosis without myelopathy or radiculopathy, lumbar region: Secondary | ICD-10-CM | POA: Diagnosis not present

## 2024-04-20 DIAGNOSIS — M5030 Other cervical disc degeneration, unspecified cervical region: Secondary | ICD-10-CM

## 2024-04-20 DIAGNOSIS — M546 Pain in thoracic spine: Secondary | ICD-10-CM | POA: Diagnosis not present

## 2024-04-20 DIAGNOSIS — M542 Cervicalgia: Secondary | ICD-10-CM | POA: Diagnosis not present

## 2024-04-20 DIAGNOSIS — G8929 Other chronic pain: Secondary | ICD-10-CM

## 2024-04-21 NOTE — Telephone Encounter (Signed)
 Pt called requesting that this is filled urgently. Says he will not make it the rest of the day without his medicine. Please advise

## 2024-04-21 NOTE — Telephone Encounter (Signed)
 Requested medication (s) are due for refill today:   Provider to review  Requested medication (s) are on the active medication list:   Yes  Future visit scheduled:   Yes 11/7   Last ordered: 04/10/2024 #56, 0 refills    Non delegated refill     Looks like a possible duplicate request   Requested Prescriptions  Pending Prescriptions Disp Refills   HYDROcodone -acetaminophen  (NORCO) 10-325 MG tablet [Pharmacy Med Name: HYDROCODONE -APAP 10-325 MG TAB] 56 tablet     Sig: TAKE 1 TABLET BY MOUTH EVERY 6 HOURS AS NEEDED     Not Delegated - Analgesics:  Opioid Agonist Combinations Failed - 04/21/2024  1:31 PM      Failed - This refill cannot be delegated      Failed - Urine Drug Screen completed in last 360 days      Passed - Valid encounter within last 3 months    Recent Outpatient Visits           4 days ago Chronic bilateral low back pain with bilateral sciatica   [facility] Marsa PARAS, DO   1 month ago Chronic pain of left knee   [facility] [place], Marsa PARAS, DO   3 months ago Chronic pain of left knee   [facility] Marsa PARAS, DO   3 months ago Annual physical exam   [place] [facility] Marsa PARAS, DO   5 months ago Chronic pain of left knee   [place] [facility] Marsa PARAS, DO       Future Appointments             In 2 months Edman, Marsa PARAS, DO [place] [facility], [facility]

## 2024-04-24 DIAGNOSIS — M5416 Radiculopathy, lumbar region: Secondary | ICD-10-CM | POA: Diagnosis not present

## 2024-04-30 ENCOUNTER — Other Ambulatory Visit: Payer: Self-pay | Admitting: Family Medicine

## 2024-04-30 DIAGNOSIS — G8929 Other chronic pain: Secondary | ICD-10-CM

## 2024-04-30 DIAGNOSIS — M5030 Other cervical disc degeneration, unspecified cervical region: Secondary | ICD-10-CM

## 2024-04-30 DIAGNOSIS — M1712 Unilateral primary osteoarthritis, left knee: Secondary | ICD-10-CM

## 2024-04-30 NOTE — Telephone Encounter (Signed)
 Requested medications are due for refill today.  Too soon  Requested medications are on the active medications list.  yes  Last refill. 04/21/2024 #56 0 rf  Future visit scheduled.   yes  Notes to clinic.  Refill/refusal not delegated.    Requested Prescriptions  Pending Prescriptions Disp Refills   HYDROcodone -acetaminophen  (NORCO) 10-325 MG tablet [Pharmacy Med Name: HYDROCODONE -APAP 10-325 MG TAB] 56 tablet     Sig: TAKE 1 TABLET BY MOUTH EVERY 4 HOURS AS NEEDED     Not Delegated - Analgesics:  Opioid Agonist Combinations Failed - 04/30/2024  2:55 PM      Failed - This refill cannot be delegated      Failed - Urine Drug Screen completed in last 360 days      Passed - Valid encounter within last 3 months    Recent Outpatient Visits           1 week ago Chronic bilateral low back pain with bilateral sciatica   [place] [facility] Marsa PARAS, DO   1 month ago Chronic pain of left knee   [facility], Marsa PARAS, DO   3 months ago Chronic pain of left knee   [facility] Marsa PARAS, DO   3 months ago Annual physical exam   [place] [facility] Marsa PARAS, DO   5 months ago Chronic pain of left knee   [place] [facility] Marsa PARAS, DO       Future Appointments             In 2 months Edman, Marsa PARAS, DO  [facility], [address]

## 2024-05-01 DIAGNOSIS — M5416 Radiculopathy, lumbar region: Secondary | ICD-10-CM | POA: Diagnosis not present

## 2024-05-01 DIAGNOSIS — E119 Type 2 diabetes mellitus without complications: Secondary | ICD-10-CM | POA: Diagnosis not present

## 2024-05-06 ENCOUNTER — Other Ambulatory Visit: Payer: Self-pay | Admitting: Family Medicine

## 2024-05-06 DIAGNOSIS — F5104 Psychophysiologic insomnia: Secondary | ICD-10-CM

## 2024-05-06 NOTE — Telephone Encounter (Unsigned)
 Copied from CRM [id number]. Topic: Clinical - Medication Refill >> May 06, 2024  1:51 PM Everette C wrote: Medication: QUEtiapine  (SEROQUEL ) 50 MG tablet [[id number]]  Has the patient contacted their pharmacy? Yes (Agent: If no, request that the patient contact the pharmacy for the refill. If patient does not wish to contact the pharmacy document the reason why and proceed with request.) (Agent: If yes, when and what did the pharmacy advise?)  This is the patient's preferred pharmacy:  [facility] [place], [place] - [address] [place] [address] Phone: [phone] Fax: [phone]  Is this the correct pharmacy for this prescription? Yes If no, delete pharmacy and type the correct one.   Has the prescription been filled recently? Yes  Is the patient out of the medication? Yes  Has the patient been seen for an appointment in the last year OR does the patient have an upcoming appointment? Yes  Can we respond through MyChart? No  Agent: Please be advised that Rx refills may take up to 3 business days. We ask that you follow-up with your pharmacy.

## 2024-05-06 NOTE — Telephone Encounter (Signed)
 FYI Only or Action Required?: Action required by provider: medication refill request.  Patient was last seen in primary care on 04/17/2024 by [facility] Marsa PARAS, DO.  Called Nurse Triage reporting Medication Refill.  Symptoms began today.  Interventions attempted: Nothing.  Symptoms are: stable.  Triage Disposition: No disposition on file.  Patient/caregiver understands and will follow disposition?:

## 2024-05-07 NOTE — Telephone Encounter (Signed)
 Requested medications are due for refill today.  yes  Requested medications are on the active medications list.  yes  Last refill. 04/08/2024 #30 0 rf  Future visit scheduled.   yes  Notes to clinic.  Refill not delegated.    Requested Prescriptions  Pending Prescriptions Disp Refills   QUEtiapine  (SEROQUEL ) 50 MG tablet [Pharmacy Med Name: QUETIAPINE  FUMARATE 50 MG TAB] 30 tablet 0    Sig: TAKE 1 TABLET BY MOUTH AT BEDTIME     Not Delegated - Psychiatry:  Antipsychotics - Second Generation (Atypical) - quetiapine  Failed - 05/07/2024  2:10 PM      Failed - This refill cannot be delegated      Failed - Lipid Panel in normal range within the last 12 months    Cholesterol  Date Value Ref Range Status  01/08/2024 179 <200 mg/dL Final   LDL Cholesterol (Calc)  Date Value Ref Range Status  01/08/2024 89 mg/dL (calc) Final    Comment:    Reference range: <100 . Desirable range <100 mg/dL for primary prevention;   <70 mg/dL for patients with CHD or diabetic patients  with > or = 2 CHD risk factors. SABRA LDL-C is now calculated using the Martin-Hopkins  calculation, which is a validated novel method providing  better accuracy than the Friedewald equation in the  estimation of LDL-C.  Gladis APPLETHWAITE et al. SANDREA. 7986;689(80): 2061-2068  (http://education.Quest[employer].com/faq/FAQ164)    HDL  Date Value Ref Range Status  01/08/2024 65 > OR = 40 mg/dL Final   Triglycerides  Date Value Ref Range Status  01/08/2024 150 (H) <150 mg/dL Final         Passed - TSH in normal range and within 360 days    TSH  Date Value Ref Range Status  01/08/2024 1.20 0.40 - 4.50 mIU/L Final         Passed - Completed PHQ-2 or PHQ-9 in the last 360 days      Passed - Last BP in normal range    BP Readings from Last 1 Encounters:  04/17/24 118/76         Passed - Last Heart Rate in normal range    Pulse Readings from Last 1 Encounters:  04/17/24 80         Passed - Valid encounter within  last 6 months    Recent Outpatient Visits           2 weeks ago Chronic bilateral low back pain with bilateral sciatica   [place] [facility] [place], Marsa PARAS, DO   2 months ago Chronic pain of left knee   [place] [facility] [place], Marsa PARAS, DO   3 months ago Chronic pain of left knee   [place] [facility] Marsa PARAS, DO   3 months ago Annual physical exam   [facility] Marsa PARAS, DO   6 months ago Chronic pain of left knee   [place] [facility] Marsa PARAS, DO       Future Appointments             In 1 month Edman, Marsa PARAS, DO [place] [facility], [address]            Passed - CBC within normal limits and completed in the last 12 months    WBC  Date Value Ref Range Status  01/08/2024 7.0 3.8 -  10.8 Thousand/uL Final   RBC  Date Value Ref Range Status  01/08/2024 4.07 (L) 4.20 - 5.80 Million/uL Final   Hemoglobin  Date Value Ref Range Status  01/08/2024 13.5 13.2 - 17.1 g/dL Final   HCT  Date Value Ref Range Status  01/08/2024 40.9 38.5 - 50.0 % Final   MCHC  Date Value Ref Range Status  01/08/2024 33.0 32.0 - 36.0 g/dL Final    Comment:    For adults, a slight decrease in the calculated MCHC value (in the range of 30 to 32 g/dL) is most likely not clinically significant; however, it should be interpreted with caution in correlation with other red cell parameters and the patient's clinical condition.    [facility]  Date Value Ref Range Status  01/08/2024 33.2 (H) 27.0 - 33.0 pg Final   MCV  Date Value Ref Range Status  01/08/2024 100.5 (H) 80.0 - 100.0 fL Final   No results found for: PLTCOUNTKUC, LABPLAT, POCPLA RDW  Date Value Ref Range Status  01/08/2024 14.2 11.0 - 15.0 % Final         Passed - CMP within normal limits and completed in the last 12  months    Albumin  Date Value Ref Range Status  04/18/2023 3.4 (L) 3.5 - 5.0 g/dL Final   Alkaline Phosphatase  Date Value Ref Range Status  04/18/2023 90 38 - 126 U/L Final   Alkaline phosphatase (APISO)  Date Value Ref Range Status  01/08/2024 85 35 - 144 U/L Final   ALT  Date Value Ref Range Status  01/08/2024 12 9 - 46 U/L Final   AST  Date Value Ref Range Status  01/08/2024 13 10 - 35 U/L Final   BUN  Date Value Ref Range Status  01/08/2024 20 7 - 25 mg/dL Final   Calcium   Date Value Ref Range Status  01/08/2024 9.3 8.6 - 10.3 mg/dL Final   CO2  Date Value Ref Range Status  01/08/2024 29 20 - 32 mmol/L Final   Creat  Date Value Ref Range Status  01/08/2024 0.85 0.70 - 1.30 mg/dL Final   Creatinine, Urine  Date Value Ref Range Status  01/16/2024 27 20 - 320 mg/dL Final   Glucose, Bld  Date Value Ref Range Status  01/08/2024 223 (H) 65 - 139 mg/dL Final    Comment:    .        Non-fasting reference interval .    Glucose-Capillary  Date Value Ref Range Status  01/03/2024 113 (H) 70 - 99 mg/dL Final    Comment:    Glucose reference range applies only to samples taken after fasting for at least 8 hours.   Potassium  Date Value Ref Range Status  01/08/2024 4.1 3.5 - 5.3 mmol/L Final   Sodium  Date Value Ref Range Status  01/08/2024 140 135 - 146 mmol/L Final   Total Bilirubin  Date Value Ref Range Status  01/08/2024 0.3 0.2 - 1.2 mg/dL Final   Bilirubin, Direct  Date Value Ref Range Status  07/28/2021 0.1 0.0 - 0.2 mg/dL Final   Indirect Bilirubin  Date Value Ref Range Status  07/28/2021 0.5 0.3 - 0.9 mg/dL Final    Comment:    Performed at [facility], [address] [place], [place] [address]   Protein, ur  Date Value Ref Range Status  03/10/2023 30 (A) NEGATIVE mg/dL Final   Total Protein  Date Value Ref Range Status  01/08/2024 5.9 (L) 6.1 -  8.1 g/dL Final   GFR, Est African American  Date Value Ref Range Status   06/03/2019 118 > OR = 60 mL/min/1.47m2 Final   eGFR  Date Value Ref Range Status  01/08/2024 105 > OR = 60 mL/min/1.29m2 Final   GFR, Est Non African American  Date Value Ref Range Status  06/03/2019 102 > OR = 60 mL/min/1.62m2 Final   GFR, Estimated  Date Value Ref Range Status  07/12/2023 >60 >60 mL/min Final    Comment:    (NOTE) Calculated using the CKD-EPI Creatinine Equation (2021)

## 2024-05-07 NOTE — Telephone Encounter (Signed)
 Requested medications are due for refill today.  yes   Requested medications are on the active medications list.  yes   Last refill. 04/08/2024 #30 0 rf   Future visit scheduled.   yes   Notes to clinic.  Refill not delegated.     Requested Prescriptions  Pending Prescriptions Disp Refills   QUEtiapine  (SEROQUEL ) 50 MG tablet 30 tablet 0    Sig: Take 1 tablet (50 mg total) by mouth at bedtime.     Not Delegated - Psychiatry:  Antipsychotics - Second Generation (Atypical) - quetiapine  Failed - 05/07/2024  2:12 PM      Failed - This refill cannot be delegated      Failed - Lipid Panel in normal range within the last 12 months    Cholesterol  Date Value Ref Range Status  01/08/2024 179 <200 mg/dL Final   LDL Cholesterol (Calc)  Date Value Ref Range Status  01/08/2024 89 mg/dL (calc) Final    Comment:    Reference range: <100 . Desirable range <100 mg/dL for primary prevention;   <70 mg/dL for patients with CHD or diabetic patients  with > or = 2 CHD risk factors. SABRA LDL-C is now calculated using the Martin-Hopkins  calculation, which is a validated novel method providing  better accuracy than the Friedewald equation in the  estimation of LDL-C.  Gladis APPLETHWAITE et al. SANDREA. 7986;689(80): 2061-2068  (http://education.[facility].com/faq/FAQ164)    HDL  Date Value Ref Range Status  01/08/2024 65 > OR = 40 mg/dL Final   Triglycerides  Date Value Ref Range Status  01/08/2024 150 (H) <150 mg/dL Final         Passed - TSH in normal range and within 360 days    TSH  Date Value Ref Range Status  01/08/2024 1.20 0.40 - 4.50 mIU/L Final         Passed - Completed PHQ-2 or PHQ-9 in the last 360 days      Passed - Last BP in normal range    BP Readings from Last 1 Encounters:  04/17/24 118/76         Passed - Last Heart Rate in normal range    Pulse Readings from Last 1 Encounters:  04/17/24 80         Passed - Valid encounter within last 6 months    Recent  Outpatient Visits           2 weeks ago Chronic bilateral low back pain with bilateral sciatica   [place] [facility], Marsa PARAS, DO   2 months ago Chronic pain of left knee   [place] [facility] [place], Marsa PARAS, DO   3 months ago Chronic pain of left knee   [place] [facility] [place] Marsa PARAS, DO   3 months ago Annual physical exam   [place] [facility] [place] Marsa PARAS, DO   6 months ago Chronic pain of left knee   [place] [facility] [place] Marsa PARAS, DO       Future Appointments             In 1 month [place], Marsa PARAS, DO [place] [facility] [address]            Passed - CBC within normal limits and completed in the last 12 months    WBC  Date Value Ref Range Status  01/08/2024 7.0 3.8 -  10.8 Thousand/uL Final   RBC  Date Value Ref Range Status  01/08/2024 4.07 (L) 4.20 - 5.80 Million/uL Final   Hemoglobin  Date Value Ref Range Status  01/08/2024 13.5 13.2 - 17.1 g/dL Final   HCT  Date Value Ref Range Status  01/08/2024 40.9 38.5 - 50.0 % Final   MCHC  Date Value Ref Range Status  01/08/2024 33.0 32.0 - 36.0 g/dL Final    Comment:    For adults, a slight decrease in the calculated MCHC value (in the range of 30 to 32 g/dL) is most likely not clinically significant; however, it should be interpreted with caution in correlation with other red cell parameters and the patient's clinical condition.    [facility]  Date Value Ref Range Status  01/08/2024 33.2 (H) 27.0 - 33.0 pg Final   MCV  Date Value Ref Range Status  01/08/2024 100.5 (H) 80.0 - 100.0 fL Final   No results found for: PLTCOUNTKUC, LABPLAT, POCPLA RDW  Date Value Ref Range Status  01/08/2024 14.2 11.0 - 15.0 % Final         Passed - CMP within normal limits and completed in the last 12 months    Albumin  Date  Value Ref Range Status  04/18/2023 3.4 (L) 3.5 - 5.0 g/dL Final   Alkaline Phosphatase  Date Value Ref Range Status  04/18/2023 90 38 - 126 U/L Final   Alkaline phosphatase (APISO)  Date Value Ref Range Status  01/08/2024 85 35 - 144 U/L Final   ALT  Date Value Ref Range Status  01/08/2024 12 9 - 46 U/L Final   AST  Date Value Ref Range Status  01/08/2024 13 10 - 35 U/L Final   BUN  Date Value Ref Range Status  01/08/2024 20 7 - 25 mg/dL Final   Calcium   Date Value Ref Range Status  01/08/2024 9.3 8.6 - 10.3 mg/dL Final   CO2  Date Value Ref Range Status  01/08/2024 29 20 - 32 mmol/L Final   Creat  Date Value Ref Range Status  01/08/2024 0.85 0.70 - 1.30 mg/dL Final   Creatinine, Urine  Date Value Ref Range Status  01/16/2024 27 20 - 320 mg/dL Final   Glucose, Bld  Date Value Ref Range Status  01/08/2024 223 (H) 65 - 139 mg/dL Final    Comment:    .        Non-fasting reference interval .    Glucose-Capillary  Date Value Ref Range Status  01/03/2024 113 (H) 70 - 99 mg/dL Final    Comment:    Glucose reference range applies only to samples taken after fasting for at least 8 hours.   Potassium  Date Value Ref Range Status  01/08/2024 4.1 3.5 - 5.3 mmol/L Final   Sodium  Date Value Ref Range Status  01/08/2024 140 135 - 146 mmol/L Final   Total Bilirubin  Date Value Ref Range Status  01/08/2024 0.3 0.2 - 1.2 mg/dL Final   Bilirubin, Direct  Date Value Ref Range Status  07/28/2021 0.1 0.0 - 0.2 mg/dL Final   Indirect Bilirubin  Date Value Ref Range Status  07/28/2021 0.5 0.3 - 0.9 mg/dL Final    Comment:    Performed at [facility], [address] [place], [place] [address]   Protein, ur  Date Value Ref Range Status  03/10/2023 30 (A) NEGATIVE mg/dL Final   Total Protein  Date Value Ref Range Status  01/08/2024 5.9 (L) 6.1 -  8.1 g/dL Final   GFR, Est African American  Date Value Ref Range Status  06/03/2019 118 > OR = 60  mL/min/1.7m2 Final   eGFR  Date Value Ref Range Status  01/08/2024 105 > OR = 60 mL/min/1.62m2 Final   GFR, Est Non African American  Date Value Ref Range Status  06/03/2019 102 > OR = 60 mL/min/1.42m2 Final   GFR, Estimated  Date Value Ref Range Status  07/12/2023 >60 >60 mL/min Final    Comment:    (NOTE) Calculated using the CKD-EPI Creatinine Equation (2021)

## 2024-05-08 ENCOUNTER — Other Ambulatory Visit: Payer: Self-pay | Admitting: Family Medicine

## 2024-05-08 DIAGNOSIS — M5030 Other cervical disc degeneration, unspecified cervical region: Secondary | ICD-10-CM

## 2024-05-08 DIAGNOSIS — G8929 Other chronic pain: Secondary | ICD-10-CM

## 2024-05-08 DIAGNOSIS — M1712 Unilateral primary osteoarthritis, left knee: Secondary | ICD-10-CM

## 2024-05-08 MED ORDER — HYDROCODONE-ACETAMINOPHEN 10-325 MG PO TABS: 1.0000 | ORAL_TABLET | ORAL | 0 refills | Status: DC | PRN

## 2024-05-11 ENCOUNTER — Telehealth: Payer: Self-pay

## 2024-05-11 NOTE — Telephone Encounter (Signed)
 Copied from CRM [id number]. Topic: Clinical - Prescription Issue >> May 11, 2024  1:40 PM Ivette P wrote: Reason for CRM: PT wants to see if Dr. MARLA can send in the same script but in the 400mg  capsules due to the 800 being too big.   gabapentin  (NEURONTIN ) 800 MG tablet    If can be sent to [place]  [facility] - [place], [place] - [address] [place] [address] Phone: [phone]  Fax: [phone]

## 2024-05-11 NOTE — Telephone Encounter (Signed)
 Unfortunately the quantity limit will be a problem. He takes 800mg  4 times a day, that means he would need 8 pills per day on the 400mg . Usually max coverage is about 4 pills per day.  Please advise him that he can cut the 800mg  tab in half to make it easier to take. That is best option.  Marsa Officer, DO [place] [facility] 05/11/2024, 2:07 PM

## 2024-05-11 NOTE — Telephone Encounter (Signed)
 Spoke with patient, he was insisting that this prescription be changed. He has an appointment scheduled for tomorrow.

## 2024-05-12 ENCOUNTER — Ambulatory Visit (INDEPENDENT_AMBULATORY_CARE_PROVIDER_SITE_OTHER): Admitting: Family Medicine

## 2024-05-12 ENCOUNTER — Encounter: Payer: Self-pay | Admitting: Family Medicine

## 2024-05-12 VITALS — BP 124/82 | HR 79 | Ht 72.0 in | Wt 218.2 lb

## 2024-05-12 DIAGNOSIS — M5441 Lumbago with sciatica, right side: Secondary | ICD-10-CM | POA: Diagnosis not present

## 2024-05-12 DIAGNOSIS — M5030 Other cervical disc degeneration, unspecified cervical region: Secondary | ICD-10-CM | POA: Diagnosis not present

## 2024-05-12 DIAGNOSIS — G9581 Conus medullaris syndrome: Secondary | ICD-10-CM | POA: Diagnosis not present

## 2024-05-12 DIAGNOSIS — G8929 Other chronic pain: Secondary | ICD-10-CM

## 2024-05-12 DIAGNOSIS — M5412 Radiculopathy, cervical region: Secondary | ICD-10-CM | POA: Diagnosis not present

## 2024-05-12 DIAGNOSIS — M5126 Other intervertebral disc displacement, lumbar region: Secondary | ICD-10-CM | POA: Diagnosis not present

## 2024-05-12 DIAGNOSIS — M5442 Lumbago with sciatica, left side: Secondary | ICD-10-CM

## 2024-05-12 MED ORDER — GABAPENTIN 400 MG PO CAPS: 800.0000 mg | ORAL_CAPSULE | Freq: Four times a day (QID) | ORAL | 2 refills | Status: DC

## 2024-05-12 NOTE — Patient Instructions (Addendum)
 Thank you for coming to the office today.  Once they have permission to review the MRI images then they schedule, stay tuned for updates.  [facility] (SAME LOCATION AS [facility]) [address] [place],  [place]  [address] Main: [phone]  Switch Gabapentin  from 800mg  FOUR TIMES A DAY to 400mg  x 2 = 800mg  4 times per day  Refilled Hydrocodone  to pharmacy, 9/15 pick up  Please schedule a Follow-up Appointment to: Return if symptoms worsen or fail to improve.  If you have any other questions or concerns, please feel free to call the office or send a message through MyChart. You may also schedule an earlier appointment if necessary.  Additionally, you may be receiving a survey about your experience at our office within a few days to 1 week by e-mail or mail. We value your feedback.  Marsa Officer, DO [facility] [place], [place]

## 2024-05-12 NOTE — Progress Notes (Signed)
 Subjective:    Patient ID: Tyler Martinez, male    DOB: 1971-06-22, 53 y.o.   MRN: [id number]  Tyler Martinez is a 53 y.o. male presenting on 05/12/2024 for Back Pain   HPI  Discussed the use of AI scribe software for clinical note transcription with the patient, who gave verbal consent to proceed.  History of Present Illness   Tyler Martinez is a 53 year old male who presents with concerns about a spinal tumor and requests a referral to a spine specialist.  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 Other imaging of C and T spine as well. - Pain radiates down both arms and legs, worsened by improper sitting posture - Significant functional impairment, including missed workdays and exacerbation of symptoms after physical exertion (e.g., pushing a truck) - Pain significantly impacts ability to work and daily activities   Followed by [facility] Dr Dow Spine specialist currently and now he is requesting Neurosurgery specialty consult  Neuropathic pain management and medication tolerance - Takes gabapentin  for neuropathic pain - Prefers 400 mg capsules over 800 mg tablets due to better gastrointestinal tolerance - Dosing regimen: two 400 mg capsules four times daily, which he finds more effective and less irritating    Pain  management and medication use - Current pain management regimen includes muscle relaxers (ineffective) and hydrocodone  10/325 mg per day  up to 4 pills per day  - Previously trialed prednisone  with temporary relief; open to retrying prednisone  for symptom control - Expresses frustration with inadequate pain control and the financial burden of ongoing pain management and potential surgery   Concerns regarding mobility and surgical intervention - Concerned about the potential need for surgery and its impact on mobility, particularly risk of losing function in his legs - Awaiting further imaging and evaluation to determine appropriate treatment plan     Chronic knee pain - Chronic knee pain persisting for years Followed by [facility] Dr Gini - Receives intra-articular injections      05/12/2024    9:12 AM 04/17/2024   10:22 AM 03/03/2024    1:57 PM  Depression screen PHQ 2/9  Decreased Interest 1 1 3   Down, Depressed, Hopeless 0 0 1  PHQ - 2 Score 1 1 4   Altered sleeping 0 1 0  Tired, decreased energy 1 0 1  Change in appetite  1 0  Feeling bad or failure about yourself  0 0 0  Trouble concentrating 1 0 3  Moving slowly or fidgety/restless 0 0 0  Suicidal thoughts 0 0 0  PHQ-9 Score 3 3 8   Difficult doing work/chores   Extremely dIfficult       05/12/2024    9:13 AM 04/17/2024   10:21 AM 03/03/2024    1:57 PM 01/16/2024    9:17 AM  GAD 7 : Generalized Anxiety Score  Nervous, Anxious, on Edge 0 1 1 1   Control/stop worrying  0 1 0 1  Worry too much - different things 1 1 2 1   Trouble relaxing 1 0 3 0  Restless 0 0 2 1  Easily annoyed or irritable 0 0 1 1  Afraid - awful might happen 0 0 1 0  Total GAD 7 Score 2 3 10 5   Anxiety Difficulty   Somewhat difficult     Social History   Tobacco Use   Smoking status: Every Day    Current packs/day: 1.00    Average packs/day: 1 pack/day for 8.0 years (8.0 ttl pk-yrs)    Types: Cigarettes   Smokeless tobacco: Never  Vaping Use    Vaping status: Never Used  Substance Use Topics   Alcohol use: Not Currently    Alcohol/week: 2.0 standard drinks of alcohol    Types: 2 Standard drinks or equivalent per week    Comment: Drinks vodka every evening   Drug use: Not Currently    Types: Amphetamines    Review of Systems Per HPI unless specifically indicated above     Objective:    BP 124/82 (BP Location: Right Arm, Patient Position: Sitting, Cuff Size: Normal)   Pulse 79   Ht 6' (1.829 m)   Wt 218 lb 4 oz (99 kg)   SpO2 95%   BMI 29.60 kg/m   Wt Readings from Last 3 Encounters:  05/12/24 218 lb 4 oz (99 kg)  04/17/24 210 lb 6 oz (95.4 kg)  03/03/24 209 lb 12.8 oz (95.2 kg)    Physical Exam Vitals and nursing note reviewed.  Constitutional:      General: He is not in acute distress.    Appearance: Normal appearance. He is well-developed. He is not diaphoretic.     Comments: Well-appearing, comfortable, cooperative  HENT:     Head: Normocephalic and atraumatic.  Eyes:     General:        Right eye: No discharge.        Left eye: No discharge.     Conjunctiva/sclera: Conjunctivae normal.  Cardiovascular:     Rate and Rhythm: Normal rate.  Pulmonary:     Effort: Pulmonary effort is normal.  Musculoskeletal:     Comments: Low Back with some reduced range of motion and pain with flexion and extension. Some reproduced radiating pain into legs with straight leg raise.  Left Knee with bulky appearance some crepitus on motion and joint line pain.  Skin:    General: Skin is warm and dry.     Findings: No erythema or rash.  Neurological:     Mental Status: He is alert and oriented to person, place, and time.  Psychiatric:        Mood and Affect: Mood normal.        Behavior: Behavior normal.        Thought Content: Thought content normal.     Comments: Well groomed, good eye contact, normal speech and thoughts                                                                         04/20/2024  04/20/2024 MR thoracic spine w/wo contrast   [facility] Patient Name: Tyler Martinez Case ID: [id number] Patient DOB: [birth year]  Referring Physician: Frederic Don Exam Date: 04/20/2024 Exam Description: MR Thoracic Spine w/wo Contrast HISTORY: Evaluaet for herniated nucleus pulposus, stenosis. evaluation on 04/03/2024 with back and leg pain. MRI demonstrated an incomplete circumcised round lesion of the conus. Patient reports history of an open wound for approximately 10 years. TECHNICAL FACTORS: Long- and short-axis fat- and water-weighted images were obtained before and after contrast administration. Contrast Type: DOTAREM 376.9 mg/mL NDC [address]-8 Contrast Amt: 10.00 CC/mL COMPARISON: None. FINDINGS: Vertebral body heights within the cervical spine are maintained without compression fracture deformity. Sternotomy wires. Mild Modic type 2 change anteriorly at the T11-T12 and T12-L1 levels. Subtle deformation of the dorsal aspect of the cord at the T4 and T4-T5 levels. This may reflect an occult cord herniation. Small 5-6 mm nodule in the region of the conus partially visualized. This demonstrates T1 intermediate to slight hyperintense signal intensity, and T2 and STIR hyperintensity. Equivocal enhancement given T1 nonfat sat pre contrast and T1 fat-sat postcontrast imaging performed. T1 noncontrast fat sat imaging would be of value to assess for definitive enhancement. Subcentimeter T2 hyperintense lesion within the posterior aspect of the right hepatic lobe partially visualized at the film edge likely reflects a benign hepatic cyst or hemangioma. T1-T2: Right paracentral noncompressive disc protrusion. T2-T3 and T3-T4: No compressive disc disease. T4-T5: Central disc protrusion with ventral cord abutment. T5-T6: Central disc protrusion with minimal craniad and caudal migration and ventral thecal sac effacement. T6-T7: Minimal retrolisthesis. Central disc protrusion with minimal craniad and less so  caudad migration with near ventral cord abutment. T7-T8: Schmorl's node. No compressive disc disease. T8-T9: Schmorl's node formation. Minimal retrolisthesis. Central noncompressive disc protrusion. T9-T10: Schmorl's node formation and minimal retrolisthesis. Central noncompressive disc protrusion. T10-T11: No compressive disc disease. T11-T12: Schmorl's node formation. No compressive disc disease. T12-L1: Anterior spondylotic change. No compressive disc disease. Visualized thoracic aorta is unremarkable. CONCLUSION: 1. Small 5-6 mm nodule in the region of the conus partially visualized. Noncontrast T1 fat sat imaging would be of value to assess for true enhancement. Alternatively, short-term interval follow-up MRI examination in 3 months time could be performed. The lesion may be cystic. Differential diagnostic considerations would include a small cystic mixed papillary ependymoma, neural tumor such as a small cystic schwannoma or neurofibroma, less likely a cystic meningioma or much less likely a cystic metastasis. 2. Subtle deformation of the dorsal aspect of the cord at the T4 and T4-T5 levels. This may reflect an occult cord herniation. 3. Central disc protrusions at the T4-T5 through T6-T7 disc levels with ventral cord abutment at the T4-T5 and T6-T7 disc levels. 4. Subcentimeter T2 hyperintense lesion within the posterior aspect of the right hepatic lobe partially visualized at the film edge likely reflects a benign hepatic cyst or hemangioma. Thank you for the opportunity to provide your interpretation. Mont Luke Gaucher, MD A: SK 04/20/2024 5:56 PM EST         [email or web address] [place] [address] [place], [place], [id number], Ph [phone] 04/21/2024 14:21:25  04/20/2024 04/20/2024 MR cervical spine w/wo contrast   [facility] Patient Name: LANNIS LICHTENWALNER Case ID: [id number] Patient DOB: 1971-05-23 Referring Physician: Frederic Don Exam Date: 04/20/2024 Exam Description: MR  Cervical Spine w/wo Contrast HISTORY: Evaluate for herniated nucleus pulposus/stenosis. Evaluation on 04/03/2024 with back and leg pain. MRI demonstrated an incomplete circumcised round lesion of the conus. Patient reports history of an open wound for approximately 10 years. TECHNICAL FACTORS: Long- and short-axis fat- and water-weighted  images were obtained before and after contrast administration. Contrast Type: DOTAREM 376.9 mg/mL NDC [address]-8 Contrast Amt: 10.00 CC/mL COMPARISON: None. FINDINGS: Intrinsic cord signal is unremarkable. Vertebral body heights within the cervical spine are maintained without compression fracture deformity. Marrow pattern is unremarkable. No Modic changes. Craniocervical junction is unremarkable. C1-C2 articulation: Unremarkable. C2-C3: No compressive disc disease. C3-C4: Minimal retrolisth         [facility] [place] [address] [place], [place], [id number], Ph [phone] 04/21/2024 14:21:25  04/20/2024 04/20/2024 MRI, lumbar spine, w/wo contrast   [facility] Patient Name: DYON ROTERT Case ID: [id number] Patient DOB: 03-Jul-1971 Referring Physician: Frederic Don Exam Date: 04/20/2024 Exam Description: MR Lumbar Spine w/wo Contrast HISTORY: Evaluation on 04/03/2024 with back and leg pain. MRI demonstrated an incomplete circumcised round lesion of the conus. Patient reports history of an open wound for approximately 10 years. Rule out herniated nucleus pulposus verses stenosis. TECHNICAL FACTORS: Long- and short-axis fat- and water-weighted images were obtained before and after contrast administration. Contrast Type: DOTAREM 376.9 mg/mL NDC [address]-8 Contrast Amt: 10.00 CC/mL. COMPARISON: Lumbar spine MRI 04/07/2024. FINDINGS: Small left renal cyst partially visualized. Vertebral body heights within the lumbar spine are maintained without compression fracture deformity. Conus terminates at L1. T2 and STIR hyperintense nodule in the  region of the ventral aspect of the conus rightward. Differential diagnostic considerations given in the accompanying MRI thoracic spine report. Equivocal enhancement on the postcontrast data sets. Hemangioma within L1. Modic type 1 change at L5-S1. Tiny hepatic cyst or hemangioma within the posterior right hepatic lobe partially visualized. L1-L2 and L2-L3: No compressive disc disease. L3-L4: Minimal retrolisthesis. Shallow disc bulge abuts the ventral proximal descending L4 nerve roots. L4-5: Anterior spondylotic change. Disc desiccation. Mild retrolisthesis. Broad-based disc bulge abuts the ventral proximal descending L5 nerve roots. Concomitant shallow central disc protrusion with minimal subligamentous craniad extension effaces the ventral thecal sac. Minimal foraminal endplate spurring and facet degenerative change with mild biforaminal stenosis and abutment of the exiting L4 nerve roots within the neural foramina. Findings are unchanged. L5-S1: Decreased disc height and anterior spondylotic change with mild retrolisthesis. Left paracentral disc herniation/extrusion with caudal migration mildly effaces the medial aspect proximal left descending S1 nerve root and measures approximately 8.5 mm craniocaudad by 9 mm transverse by 7.5 mm AP dimensions. Effacement of the left ventral thecal sac and left lateral recess stenosis. Underlying leftward eccentric disc bulge facet degenerative change with moderate left and mild right foraminal stenosis. Abutment of the exiting L5 nerve roots within the neural foramina. Findings are unchanged. Visualized sacroiliac joints are unremarkable. CONCLUSION: 1. L5-S1 left paracentral disc herniation/extrusion with caudal migration mildly effaces the medial aspect of the proximal left descending S1 nerve root. Underlying leftward eccentric disc bulge and facet degenerative change with moderate left and mild right foraminal stenosis and abutment of the exiting L5 nerve roots within  the neural foramina. Findings are unchanged. 2. L4-L5 broad-based disc bulge abuts the ventral proximal descending L5 nerve roots. Concomitant shallow central disc protrusion with minimal subligamentous craniad extension effaces the ventral thecal sac. Mild biforaminal stenosis with abutment of the exiting L4 nerve roots within the neural foramina. Findings are unchanged. 3. L3-L4 shallow disc bulge abuts the ventral proximal descending L4 nerve roots. Findings are unchanged. 4. T2 and STIR hyperintense nodule in the region of the ventral aspect of the conus rightward. Differential diagnostic considerations given in the accompanying MRI thoracic spine report. Equivocal enhancement on the  postcontrast data sets. Thank you for the opportunity to provide your interpretation. Mont Luke Gaucher, MD ABETHA FREED 04/20/2024 6:39 PM EST         [email or web address] [place] [address] [place], [place], [address], Ph [phone] 04/21/2024 14:37:20       Results for orders placed or performed in visit on 01/16/24  Urine Microalbumin w/creat. ratio   Collection Time: 01/16/24  9:16 AM  Result Value Ref Range   Creatinine, Urine 27 20 - 320 mg/dL   Microalb, Ur 0.3 mg/dL   Microalb Creat Ratio 11 <30 mg/g creat      Assessment & Plan:   Problem List Items Addressed This Visit     Cervical radiculitis   Relevant Medications   gabapentin  (NEURONTIN ) 400 MG capsule   Other Relevant Orders   Ambulatory referral to Neurosurgery   Chronic bilateral low back pain with bilateral sciatica   Relevant Medications   gabapentin  (NEURONTIN ) 400 MG capsule   Other Relevant Orders   Ambulatory referral to Neurosurgery   DDD (degenerative disc disease), cervical   Other Visit Diagnoses       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(HCC)    -  Primary   Relevant Orders   Ambulatory referral to Neurosurgery     Lumbar disc herniation       Relevant Orders   Ambulatory referral to Neurosurgery         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 Suspected referred pain  Chronic back pain with neuropathic symptoms, possibly related to a spinal cystic lesion. Differential includes cystic schwannoma or neurofibroma.  Followed by [facility] / Dr Dow now his spine specialist is recommending Neurosurgery consultation  - Refer to [facility] for evaluation. - Ensure [facility] images from [facility] are accessible to [facility] Neurosurgery. Coordinate with referral team. - Change gabapentin  to 400 mg capsules, two capsules four times daily. 800mg  FOUR TIMES A DAY dosing, instead of tablets - Confirm Hydrocodone  pain medication is ready for pickup. Today 9/16     Continue with [facility] specialist, he is on regimen or discussed plans for physical therapy among other options  I will agree to continue his pain management as long as he is pursuing further management by specialists and work up and other treatment options  Orders Placed This Encounter  Procedures   Ambulatory referral to Neurosurgery    Referral Priority:   Routine    Referral Type:   Surgical    Referral Reason:   Specialty Services Required    Requested Specialty:   Neurosurgery    Number of Visits Requested:   1    Meds ordered this encounter  Medications   gabapentin  (NEURONTIN ) 400 MG capsule    Sig: Take 2 capsules (800 mg total) by mouth 4 (four) times daily.    Dispense:  240 capsule    Refill:  2    Follow up plan: Return if symptoms worsen or fail to improve.    Marsa Officer, DO [facility] 05/12/2024, 9:36 AM

## 2024-05-20 ENCOUNTER — Telehealth: Payer: Self-pay

## 2024-05-20 ENCOUNTER — Other Ambulatory Visit: Payer: Self-pay | Admitting: Family Medicine

## 2024-05-20 ENCOUNTER — Telehealth: Payer: Self-pay | Admitting: Family Medicine

## 2024-05-20 DIAGNOSIS — M5030 Other cervical disc degeneration, unspecified cervical region: Secondary | ICD-10-CM

## 2024-05-20 DIAGNOSIS — G8929 Other chronic pain: Secondary | ICD-10-CM

## 2024-05-20 DIAGNOSIS — M1712 Unilateral primary osteoarthritis, left knee: Secondary | ICD-10-CM

## 2024-05-20 NOTE — Telephone Encounter (Unsigned)
 Copied from CRM #[id number]. Topic: Clinical - Medication Refill >> May 20, 2024 12:09 PM Nathanel BROCKS wrote: Medication: HYDROcodone -acetaminophen  (NORCO) 10-325 MG tablet   Has the patient contacted their pharmacy? No   This is the patient's preferred pharmacy:  [facility] [place], [place] - [address] [place] [address] [place] [address] Phone: [phone] Fax: [phone]  Is this the correct pharmacy for this prescription? Yes If no, delete pharmacy and type the correct one.   Has the prescription been filled recently? Yes  Is the patient out of the medication? Yes  Has the patient been seen for an appointment in the last year OR does the patient have an upcoming appointment? Yes  Can we respond through MyChart? No  Agent: Please be advised that Rx refills may take up to 3 business days. We ask that you follow-up with your pharmacy.

## 2024-05-20 NOTE — Telephone Encounter (Signed)
 Attempted to reach patient, vm is full. Please advise below if patient calls back. Someone tried reaching out to him. Please see below. Thanks!  [name], [name] [name], [name], [name]) (Patient) Open Communication Communication    Type  Outgoing Method  Telephone Phone Number  [phone] Contacting Type  Patient Contacting  Tyler Martinez, Tyler Martinez Relationship to Patient  Self Outcome  Voice Mail Full  Comments   Unable to leave message in regards to scheduling- needing records from previous doctor

## 2024-05-20 NOTE — Telephone Encounter (Signed)
 Copied from CRM #[id number]. Topic: Referral - Status >> May 20, 2024 12:08 PM Nathanel BROCKS wrote: Reason for CRM: pt called to see the status of his referral for a neurosurgeon, please advise pt

## 2024-05-21 NOTE — Telephone Encounter (Signed)
 Requested medications are due for refill today.  yes  Requested medications are on the active medications list.  yes  Last refill. 05/11/2024 #56 0 rf  Future visit scheduled.   yes  Notes to clinic.  Refill not delegated    Requested Prescriptions  Pending Prescriptions Disp Refills   HYDROcodone -acetaminophen  (NORCO) 10-325 MG tablet 56 tablet 0    Sig: Take 1 tablet by mouth every 4 (four) hours as needed.     Not Delegated - Analgesics:  Opioid Agonist Combinations Failed - 05/21/2024  4:13 PM      Failed - This refill cannot be delegated      Failed - Urine Drug Screen completed in last 360 days      Passed - Valid encounter within last 3 months    Recent Outpatient Visits           1 week ago 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[facility])   [place] [facility] [place], Marsa PARAS, DO   1 month ago Chronic bilateral low back pain with bilateral sciatica   [place] [facility] [place], Marsa PARAS, DO   2 months ago Chronic pain of left knee   [facility] [place] Marsa PARAS, DO   4 months ago Chronic pain of left knee   [facility] [place] Marsa PARAS, DO   4 months ago Annual physical exam   [place] [facility] [place] Marsa PARAS, DO       Future Appointments             In 1 month [place], Marsa PARAS, DO  [facility], [facility] [address]

## 2024-05-21 NOTE — Telephone Encounter (Signed)
 Requested medications are due for refill today.  yes   Requested medications are on the active medications list.  yes   Last refill. 05/11/2024 #56 0 rf   Future visit scheduled.   yes   Notes to clinic.  Refill not delegated    Requested Prescriptions  Pending Prescriptions Disp Refills   HYDROcodone -acetaminophen  (NORCO) 10-325 MG tablet [Pharmacy Med Name: HYDROCODONE -APAP 10-325 MG TAB] 56 tablet     Sig: TAKE 1 TABLET BY MOUTH EVERY 4 HOURS AS NEEDED     Not Delegated - Analgesics:  Opioid Agonist Combinations Failed - 05/21/2024  4:22 PM      Failed - This refill cannot be delegated      Failed - Urine Drug Screen completed in last 360 days      Passed - Valid encounter within last 3 months    Recent Outpatient Visits           1 week ago 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[facility])   [place] [facility] [place], Marsa PARAS, DO   1 month ago Chronic bilateral low back pain with bilateral sciatica   [facility] [place], Marsa PARAS, DO   2 months ago Chronic pain of left knee   [facility] [place]a PARAS, DO   4 months ago Chronic pain of left knee   [place] [facility] [place] Marsa PARAS, DO   4 months ago Annual physical exam   [facility] [place] Marsa PARAS, DO       Future Appointments             In 1 month [place], Marsa PARAS, DO [facility] [place]
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
## 2024-06-01 ENCOUNTER — Other Ambulatory Visit: Payer: Self-pay | Admitting: Family Medicine

## 2024-06-01 DIAGNOSIS — M1712 Unilateral primary osteoarthritis, left knee: Secondary | ICD-10-CM

## 2024-06-01 DIAGNOSIS — M5030 Other cervical disc degeneration, unspecified cervical region: Secondary | ICD-10-CM

## 2024-06-01 DIAGNOSIS — G8929 Other chronic pain: Secondary | ICD-10-CM

## 2024-06-01 NOTE — Telephone Encounter (Unsigned)
 Copied from CRM [id number]. Topic: Clinical - Medication Refill >> Jun 01, 2024  2:46 PM Tyler Martinez wrote: Medication: HYDROcodone -acetaminophen  (NORCO) 10-325 MG tablet  Has the patient contacted their pharmacy? Yes (Agent: If no, request that the patient contact the pharmacy for the refill. If patient does not wish to contact the pharmacy document the reason why and proceed with request.) (Agent: If yes, when and what did the pharmacy advise?)  This is the patient's preferred pharmacy:  [facility] [place],  - [address] [place] [address] Phone: [phone] Fax: [phone]  Is this the correct pharmacy for this prescription? Yes If no, delete pharmacy and type the correct one.   Has the prescription been filled recently? No  Is the patient out of the medication? Yes  Has the patient been seen for an appointment in the last year OR does the patient have an upcoming appointment? Yes  Can we respond through MyChart? Yes  Agent: Please be advised that Rx refills may take up to 3 business days. We ask that you follow-up with your pharmacy.

## 2024-06-02 ENCOUNTER — Inpatient Hospital Stay
Admission: RE | Admit: 2024-06-02 | Discharge: 2024-06-02 | Disposition: A | Payer: Self-pay | Source: Ambulatory Visit | Attending: Neurosurgery | Admitting: Neurosurgery

## 2024-06-02 ENCOUNTER — Other Ambulatory Visit: Payer: Self-pay | Admitting: Family Medicine

## 2024-06-02 ENCOUNTER — Ambulatory Visit: Admitting: Neurosurgery

## 2024-06-02 ENCOUNTER — Inpatient Hospital Stay
Admission: RE | Admit: 2024-06-02 | Discharge: 2024-06-02 | Disposition: A | Discharge status: Home / Self Care | Payer: Self-pay | Source: Ambulatory Visit | Attending: Neurosurgery | Admitting: Neurosurgery

## 2024-06-02 VITALS — BP 136/94 | Ht 72.0 in | Wt 212.1 lb

## 2024-06-02 DIAGNOSIS — M5127 Other intervertebral disc displacement, lumbosacral region: Secondary | ICD-10-CM | POA: Diagnosis not present

## 2024-06-02 DIAGNOSIS — G8929 Other chronic pain: Secondary | ICD-10-CM

## 2024-06-02 DIAGNOSIS — G959 Disease of spinal cord, unspecified: Secondary | ICD-10-CM | POA: Diagnosis not present

## 2024-06-02 DIAGNOSIS — F5104 Psychophysiologic insomnia: Secondary | ICD-10-CM

## 2024-06-02 DIAGNOSIS — Z049 Encounter for examination and observation for unspecified reason: Secondary | ICD-10-CM

## 2024-06-02 DIAGNOSIS — M1712 Unilateral primary osteoarthritis, left knee: Secondary | ICD-10-CM

## 2024-06-02 DIAGNOSIS — E1159 Type 2 diabetes mellitus with other circulatory complications: Secondary | ICD-10-CM

## 2024-06-02 DIAGNOSIS — M5030 Other cervical disc degeneration, unspecified cervical region: Secondary | ICD-10-CM

## 2024-06-02 DIAGNOSIS — I10 Essential (primary) hypertension: Secondary | ICD-10-CM

## 2024-06-02 DIAGNOSIS — M5416 Radiculopathy, lumbar region: Secondary | ICD-10-CM

## 2024-06-02 MED ORDER — METHOCARBAMOL 500 MG PO TABS: 500.0000 mg | ORAL_TABLET | Freq: Four times a day (QID) | ORAL | 0 refills | Status: DC | PRN

## 2024-06-02 NOTE — Telephone Encounter (Signed)
 Requested medications are due for refill today.  yes  Requested medications are on the active medications list.  yes  Last refill. 05/21/2024 #[id number] [phone] [id number]  Future visit scheduled.   yes  Notes to clinic.  Refill not delegated.    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[facility])   [place] [facility] [place], Marsa PARAS, DO   1 month ago Chronic bilateral low back pain with bilateral sciatica   [place] [facility] [place], Marsa PARAS, DO   3 months ago Chronic pain of left knee   [facility] Edman Marsa PARAS, DO   4 months ago Chronic pain of left knee   [place] [facility] Edman Marsa PARAS, DO   4 months ago Annual physical exam   [place] [facility] Edman Marsa PARAS, DO       Future Appointments             In 1 month Edman, Marsa PARAS, DO [facility], Main [address]

## 2024-06-02 NOTE — Telephone Encounter (Unsigned)
 Copied from CRM [id number]. Topic: Clinical - Medication Refill >> Jun 02, 2024  9:48 AM Amy B wrote: Medication:  HYDROcodone -acetaminophen  (NORCO) 10-325 MG tablet   QUEtiapine  (SEROQUEL ) 50 MG tablet   Has the patient contacted their pharmacy? Yes (Agent: If no, request that the patient contact the pharmacy for the refill. If patient does not wish to contact the pharmacy document the reason why and proceed with request.) (Agent: If yes, when and what did the pharmacy advise?)  This is the patient's preferred pharmacy:  [facility] - [place], [place] - [address] [place] [address] Phone: [phone] Fax: [phone]  Is this the correct pharmacy for this prescription? Yes If no, delete pharmacy and type the correct one.   Has the prescription been filled recently? No  Is the patient out of the medication? Yes  Has the patient been seen for an appointment in the last year OR does the patient have an upcoming appointment? Yes  Can we respond through MyChart? No  Agent: Please be advised that Rx refills may take up to 3 business days. We ask that you follow-up with your pharmacy.

## 2024-06-03 NOTE — Telephone Encounter (Signed)
 Requested medication (s) are due for refill today: no  Requested medication (s) are on the active medication list: yes  Last refill:  06/02/24  Future visit scheduled: yes  Notes to clinic:  unable to refuse     Requested Prescriptions  Pending Prescriptions Disp Refills   HYDROcodone -acetaminophen  (NORCO) 10-325 MG tablet 56 tablet 0    Sig: Take 1 tablet by mouth every 4 (four) hours as needed.     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 Edman Marsa PARAS, DO   4 months ago Annual physical exam   [place] [facility] Edman Marsa PARAS, DO       Future Appointments             In 1 month Edman, Marsa PARAS, DO [facility], Main [address]

## 2024-06-04 MED ORDER — QUETIAPINE FUMARATE 50 MG PO TABS
50.0000 mg | ORAL_TABLET | Freq: Every day | ORAL | 0 refills | Status: DC
Start: 2024-06-04 — End: 2024-07-02

## 2024-06-04 NOTE — Telephone Encounter (Signed)
 Requested medication (s) are due for refill today: yes  Requested medication (s) are on the active medication list: yes  Last refill:  05/07/24  Future visit scheduled: yes  Notes to clinic:  Unable to refill per protocol, cannot delegate. Quetiapine  due for refill.      Requested Prescriptions  Pending Prescriptions Disp Refills   HYDROcodone -acetaminophen  (NORCO) 10-325 MG tablet 56 tablet 0    Sig: Take 1 tablet by mouth every 4 (four) hours as needed.     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[facility])   [place] [facility] [place], Marsa PARAS, DO   1 month ago Chronic bilateral low back pain with bilateral sciatica   [place] [place], Marsa PARAS, DO   3 months ago Chronic pain of left knee   [place] [facility] [place], Marsa PARAS, DO   4 months ago Chronic pain of left knee   [place] [facility] Edman Marsa PARAS, DO   4 months ago Annual physical exam   [place] [facility] [place] Marsa PARAS, DO       Future Appointments             In 4 weeks Edman, Marsa PARAS, DO [facility], [address]             QUEtiapine  (SEROQUEL ) 50 MG tablet 30 tablet 0    Sig: Take 1 tablet (50 mg total) by mouth at bedtime.     Not Delegated - Psychiatry:  Antipsychotics - Second Generation (Atypical) - quetiapine  Failed - 06/04/2024  8:34 AM      Failed - This refill cannot be delegated      Failed - Last BP in normal range    BP Readings from Last 1 Encounters:  06/02/24 (!) 136/94         Failed - Lipid Panel in normal range  within the last 12 months    Cholesterol  Date Value Ref Range Status  01/08/2024 179 <200 mg/dL Final   LDL Cholesterol (Calc)  Date Value Ref Range Status  01/08/2024 89 mg/dL (calc) Final    Comment:    Reference range: <100 . Desirable range <100 mg/dL for primary prevention;   <70 mg/dL for patients with CHD or diabetic patients  with > or = 2 CHD risk factors. SABRA LDL-C is now calculated using the Martin-Hopkins  calculation, which is a validated novel method providing  better accuracy than the Friedewald equation in the  estimation of LDL-C.  Gladis APPLETHWAITE et al. SANDREA. 7986;689(80): 2061-2068  (http://education.Quest[employer].com/faq/FAQ164)    HDL  Date Value Ref Range Status  01/08/2024 65 > OR = 40 mg/dL Final   Triglycerides  Date Value Ref Range Status  01/08/2024 150 (H) <150 mg/dL Final         Passed - TSH in normal range and within 360 days    TSH  Date Value Ref Range Status  01/08/2024 1.20 0.40 - 4.50 mIU/L Final  Passed - Completed PHQ-2 or PHQ-9 in the last 360 days      Passed - Last Heart Rate in normal range    Pulse Readings from Last 1 Encounters:  05/12/24 79         Passed - Valid encounter within last 6 months    Recent Outpatient Visits           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[facility])   [place] [facility] [place], Marsa PARAS, DO   1 month ago Chronic bilateral low back pain with bilateral sciatica   [place] [facility], Marsa PARAS, DO   3 months ago Chronic pain of left knee   [place] [facility] [place] Marsa PARAS, DO   4 months ago Chronic pain of left knee   [place] [facility] [place], Marsa PARAS, DO   4 months ago Annual physical exam   [place] [facility] [place], Marsa PARAS, DO       Future Appointments             In 4 weeks Edman, Marsa PARAS, DO [place] [facility], [address]            Passed - CBC within normal limits and completed in the last 12 months    WBC  Date Value Ref Range Status  01/08/2024 7.0 3.8 - 10.8 Thousand/uL Final   RBC  Date Value Ref Range Status  01/08/2024 4.07 (L) 4.20 - 5.80 Million/uL Final   Hemoglobin  Date Value Ref Range Status  01/08/2024 13.5 13.2 - 17.1 g/dL Final   HCT  Date Value Ref Range Status  01/08/2024 40.9 38.5 - 50.0 % Final   MCHC  Date Value Ref Range Status  01/08/2024 33.0 32.0 - 36.0 g/dL Final    Comment:    For adults, a slight decrease in the calculated MCHC value (in the range of 30 to 32 g/dL) is most likely not clinically significant; however, it should be interpreted with caution in correlation with other red cell parameters and the patient's clinical condition.    [facility]  Date Value Ref Range Status  01/08/2024 33.2 (H) 27.0 - 33.0 pg Final   MCV  Date Value Ref Range Status  01/08/2024 100.5 (H) 80.0 - 100.0 fL Final   No results found for: PLTCOUNTKUC, LABPLAT, POCPLA RDW  Date Value Ref Range Status  01/08/2024 14.2 11.0 - 15.0 % Final         Passed - CMP within normal limits and completed in the last 12 months    Albumin  Date Value Ref Range Status  04/18/2023 3.4 (L) 3.5 - 5.0 g/dL Final   Alkaline Phosphatase  Date Value Ref Range Status  04/18/2023 90 38 - 126 U/L Final   Alkaline phosphatase (APISO)  Date Value Ref Range Status  01/08/2024 85 35 - 144 U/L Final   ALT  Date Value Ref Range Status  01/08/2024 12 9 - 46 U/L Final   AST  Date Value Ref Range Status  01/08/2024 13 10 - 35 U/L Final   BUN  Date Value Ref Range Status  01/08/2024 20 7 - 25 mg/dL Final   Calcium   Date Value Ref Range Status  01/08/2024 9.3 8.6 - 10.3 mg/dL Final   CO2  Date Value Ref Range Status  01/08/2024 29 20 - 32 mmol/L Final   Creat  Date Value Ref Range Status  01/08/2024 0.85 0.70 - 1.30 mg/dL Final   Creatinine,  Urine  Date Value Ref Range Status  01/16/2024 27 20 - 320 mg/dL Final   Glucose, Bld  Date Value Ref Range Status  01/08/2024 223 (H) 65 - 139 mg/dL Final    Comment:    .        Non-fasting reference interval .    Glucose-Capillary  Date Value Ref Range Status  01/03/2024 113 (H) 70 - 99 mg/dL Final    Comment:    Glucose reference range applies only to samples taken after fasting for at least 8 hours.   Potassium  Date Value Ref Range Status  01/08/2024 4.1 3.5 - 5.3 mmol/L Final   Sodium  Date Value Ref Range Status  01/08/2024 140 135 - 146 mmol/L Final   Total Bilirubin  Date Value Ref Range Status  01/08/2024 0.3 0.2 - 1.2 mg/dL Final   Bilirubin, Direct  Date Value Ref Range Status  07/28/2021 0.1 0.0 - 0.2 mg/dL Final   Indirect Bilirubin  Date Value Ref Range Status  07/28/2021 0.5 0.3 - 0.9 mg/dL Final    Comment:    Performed at [facility], [address] [place], [place] [address]   Protein, ur  Date Value Ref Range Status  03/10/2023 30 (A) NEGATIVE mg/dL Final   Total Protein  Date Value Ref Range Status  01/08/2024 5.9 (L) 6.1 - 8.1 g/dL Final   GFR, Est African American  Date Value Ref Range Status  06/03/2019 118 > OR = 60 mL/min/1.66m2 Final   eGFR  Date Value Ref Range Status  01/08/2024 105 > OR = 60 mL/min/1.62m2 Final   GFR, Est Non African American  Date Value Ref Range Status  06/03/2019 102 > OR = 60 mL/min/1.91m2 Final   GFR, Estimated  Date Value Ref Range Status  07/12/2023 >60 >60 mL/min Final    Comment:    (NOTE) Calculated using the CKD-EPI Creatinine Equation (2021)

## 2024-06-04 NOTE — Telephone Encounter (Signed)
 Requested medication (s) are due for refill today: yes  Requested medication (s) are on the active medication list: yes  Last refill:  05/07/24  Future visit scheduled: yes  Notes to clinic: Unable to refill per protocol, cannot delegate Quetiapine , due for refill. LRF 05/07/24 for 30 days. Unable to refuse Hydrocodone , too soon for refill.      Requested Prescriptions  Pending Prescriptions Disp Refills   HYDROcodone -acetaminophen  (NORCO) 10-325 MG tablet 56 tablet 0    Sig: Take 1 tablet by mouth every 4 (four) hours as needed.     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[facility])   [place] [facility] [place], Marsa PARAS, DO   1 month ago Chronic bilateral low back pain with bilateral sciatica   [place] [facility] [place], Marsa PARAS, DO   3 months ago Chronic pain of left knee   [place] [facility] [place], Marsa PARAS, DO   4 months ago Chronic pain of left knee   [place] [facility] Edman Marsa PARAS, DO   4 months ago Annual physical exam   [place] [facility] [place] Marsa PARAS, DO       Future Appointments             In 4 weeks Edman, Marsa PARAS, DO [place] [facility], [address]             QUEtiapine  (SEROQUEL ) 50 MG tablet 30 tablet 0    Sig: Take 1 tablet (50 mg total) by mouth at bedtime.     Not Delegated - Psychiatry:  Antipsychotics - Second Generation (Atypical) - quetiapine  Failed - 06/04/2024  8:28 AM      Failed - This refill cannot be delegated      Failed - Last BP in normal range    BP Readings from Last 1 Encounters:   06/02/24 (!) 136/94         Failed - Lipid Panel in normal range within the last 12 months    Cholesterol  Date Value Ref Range Status  01/08/2024 179 <200 mg/dL Final   LDL Cholesterol (Calc)  Date Value Ref Range Status  01/08/2024 89 mg/dL (calc) Final    Comment:    Reference range: <100 . Desirable range <100 mg/dL for primary prevention;   <70 mg/dL for patients with CHD or diabetic patients  with > or = 2 CHD risk factors. SABRA LDL-C is now calculated using the Martin-Hopkins  calculation, which is a validated novel method providing  better accuracy than the Friedewald equation in the  estimation of LDL-C.  Gladis APPLETHWAITE et al. SANDREA. 7986;689(80): 2061-2068  (http://education.[employer].com/faq/FAQ164)    HDL  Date Value Ref Range Status  01/08/2024 65 > OR = 40 mg/dL Final   Triglycerides  Date Value Ref Range Status  01/08/2024 150 (H) <150 mg/dL Final         Passed - TSH in normal range and within 360 days    TSH  Date Value Ref Range Status  01/08/2024 1.20 0.40 - 4.50 mIU/L Final         Passed - Completed PHQ-2 or PHQ-9 in the last 360 days      Passed - Last Heart Rate in normal range    Pulse Readings from Last 1 Encounters:  05/12/24 79         Passed - Valid encounter within last 6 months    Recent Outpatient Visits           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[facility])   [place] [facility] [place], Marsa PARAS, DO   1 month ago Chronic bilateral low back pain with bilateral sciatica   [place] [facility] [place], Marsa PARAS, DO   3 months ago Chronic pain of left knee   [place] [facility] Edman Marsa PARAS, DO   4 months ago Chronic pain of left knee   [place] [facility] [place], Marsa PARAS, DO   4 months ago Annual physical exam   [place] [facility] [employer], Marsa PARAS, DO       Future Appointments              In 4 weeks Edman, Marsa PARAS, DO  [facility], [address]            Passed - CBC within normal limits and completed in the last 12 months    WBC  Date Value Ref Range Status  01/08/2024 7.0 3.8 - 10.8 Thousand/uL Final   RBC  Date Value Ref Range Status  01/08/2024 4.07 (L) 4.20 - 5.80 Million/uL Final   Hemoglobin  Date Value Ref Range Status  01/08/2024 13.5 13.2 - 17.1 g/dL Final   HCT  Date Value Ref Range Status  01/08/2024 40.9 38.5 - 50.0 % Final   MCHC  Date Value Ref Range Status  01/08/2024 33.0 32.0 - 36.0 g/dL Final    Comment:    For adults, a slight decrease in the calculated MCHC value (in the range of 30 to 32 g/dL) is most likely not clinically significant; however, it should be interpreted with caution in correlation with other red cell parameters and the patient's clinical condition.    [facility]  Date Value Ref Range Status  01/08/2024 33.2 (H) 27.0 - 33.0 pg Final   MCV  Date Value Ref Range Status  01/08/2024 100.5 (H) 80.0 - 100.0 fL Final   No results found for: PLTCOUNTKUC, LABPLAT, POCPLA RDW  Date Value Ref Range Status  01/08/2024 14.2 11.0 - 15.0 % Final         Passed - CMP within normal limits and completed in the last 12 months    Albumin  Date Value Ref Range Status  04/18/2023 3.4 (L) 3.5 - 5.0 g/dL Final   Alkaline Phosphatase  Date Value Ref Range Status  04/18/2023 90 38 - 126 U/L Final   Alkaline phosphatase (APISO)  Date Value Ref Range Status  01/08/2024 85 35 - 144 U/L Final   ALT  Date Value Ref Range Status  01/08/2024 12 9 - 46 U/L Final   AST  Date Value Ref Range Status  01/08/2024 13 10 - 35 U/L Final   BUN  Date Value Ref Range Status  01/08/2024 20 7 - 25 mg/dL Final   Calcium   Date Value Ref Range Status  01/08/2024 9.3 8.6 - 10.3 mg/dL Final   CO2  Date Value  Ref Range Status  01/08/2024 29 20 - 32 mmol/L Final   Creat  Date Value Ref Range  Status  01/08/2024 0.85 0.70 - 1.30 mg/dL Final   Creatinine, Urine  Date Value Ref Range Status  01/16/2024 27 20 - 320 mg/dL Final   Glucose, Bld  Date Value Ref Range Status  01/08/2024 223 (H) 65 - 139 mg/dL Final    Comment:    .        Non-fasting reference interval .    Glucose-Capillary  Date Value Ref Range Status  01/03/2024 113 (H) 70 - 99 mg/dL Final    Comment:    Glucose reference range applies only to samples taken after fasting for at least 8 hours.   Potassium  Date Value Ref Range Status  01/08/2024 4.1 3.5 - 5.3 mmol/L Final   Sodium  Date Value Ref Range Status  01/08/2024 140 135 - 146 mmol/L Final   Total Bilirubin  Date Value Ref Range Status  01/08/2024 0.3 0.2 - 1.2 mg/dL Final   Bilirubin, Direct  Date Value Ref Range Status  07/28/2021 0.1 0.0 - 0.2 mg/dL Final   Indirect Bilirubin  Date Value Ref Range Status  07/28/2021 0.5 0.3 - 0.9 mg/dL Final    Comment:    Performed at [facility], [address] [place], [place] [address]   Protein, ur  Date Value Ref Range Status  03/10/2023 30 (A) NEGATIVE mg/dL Final   Total Protein  Date Value Ref Range Status  01/08/2024 5.9 (L) 6.1 - 8.1 g/dL Final   GFR, Est African American  Date Value Ref Range Status  06/03/2019 118 > OR = 60 mL/min/1.47m2 Final   eGFR  Date Value Ref Range Status  01/08/2024 105 > OR = 60 mL/min/1.53m2 Final   GFR, Est Non African American  Date Value Ref Range Status  06/03/2019 102 > OR = 60 mL/min/1.51m2 Final   GFR, Estimated  Date Value Ref Range Status  07/12/2023 >60 >60 mL/min Final    Comment:    (NOTE) Calculated using the CKD-EPI Creatinine Equation (2021)

## 2024-06-04 NOTE — Telephone Encounter (Signed)
 Rx 01/16/24- 6 month supply for both- too soon Requested Prescriptions  Pending Prescriptions Disp Refills   metFORMIN  (GLUCOPHAGE ) 1000 MG tablet [Pharmacy Med Name: METFORMIN  HCL 1000 MG TAB] 60 tablet 5    Sig: TAKE 1 TABLET BY MOUTH TWICE DAILY . TAKE WITH A MEAL     Endocrinology:  Diabetes - Biguanides Failed - 06/04/2024  1:24 PM      Failed - B12 Level in normal range and within 720 days    Vitamin B-12  Date Value Ref Range Status  04/19/2022 142 (L) 180 - 914 pg/mL Final    Comment:    (NOTE) This assay is not validated for testing neonatal or myeloproliferative syndrome specimens for Vitamin B12 levels. Performed at [facility], [address] [place], [place] [address]          Passed - Cr in normal range and within 360 days    Creat  Date Value Ref Range Status  01/08/2024 0.85 0.70 - 1.30 mg/dL Final   Creatinine, Urine  Date Value Ref Range Status  01/16/2024 27 20 - 320 mg/dL Final         Passed - HBA1C is between 0 and 7.9 and within 180 days    Hgb A1c MFr Bld  Date Value Ref Range Status  01/08/2024 6.5 (H) <5.7 % Final    Comment:    For someone without known diabetes, a hemoglobin A1c value of 6.5% or greater indicates that they may have  diabetes and this should be confirmed with a follow-up  test. . For someone with known diabetes, a value <7% indicates  that their diabetes is well controlled and a value  greater than or equal to 7% indicates suboptimal  control. A1c targets should be individualized based on  duration of diabetes, age, comorbid conditions, and  other considerations. . Currently, no consensus exists regarding use of hemoglobin A1c for diagnosis of diabetes for children. .          Passed - eGFR in normal range and within 360 days    GFR, Est African American  Date Value Ref Range Status  06/03/2019 118 > OR = 60 mL/min/1.64m2 Final   GFR, Est Non African American  Date Value Ref Range Status  06/03/2019 102 >  OR = 60 mL/min/1.31m2 Final   GFR, Estimated  Date Value Ref Range Status  07/12/2023 >60 >60 mL/min Final    Comment:    (NOTE) Calculated using the CKD-EPI Creatinine Equation (2021)    eGFR  Date Value Ref Range Status  01/08/2024 105 > OR = 60 mL/min/1.31m2 Final         Passed - Valid encounter within last 6 months    Recent Outpatient Visits           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[facility])   [place] [facility] [place], Marsa PARAS, DO   1 month ago Chronic bilateral low back pain with bilateral sciatica   [place] [facility] [place], Marsa PARAS, DO   3 months ago Chronic pain of left knee   [facility] [place], Marsa PARAS, DO   4 months ago Chronic pain of left knee   [facility] Edman Marsa PARAS, DO   4 months ago Annual physical exam   [place] [facility] Edman Marsa PARAS, DO       Future Appointments  In 4 weeks Edman, Marsa PARAS, DO [facility], [address]            Passed - CBC within normal limits and completed in the last 12 months    WBC  Date Value Ref Range Status  01/08/2024 7.0 3.8 - 10.8 Thousand/uL Final   RBC  Date Value Ref Range Status  01/08/2024 4.07 (L) 4.20 - 5.80 Million/uL Final   Hemoglobin  Date Value Ref Range Status  01/08/2024 13.5 13.2 - 17.1 g/dL Final   HCT  Date Value Ref Range Status  01/08/2024 40.9 38.5 - 50.0 % Final   MCHC  Date Value Ref Range Status  01/08/2024 33.0 32.0 - 36.0 g/dL Final    Comment:    For adults, a slight decrease in the calculated MCHC value (in the range of 30 to 32 g/dL) is most likely not clinically significant; however, it should be interpreted with caution in correlation with other red cell parameters and the patient's clinical condition.    [facility]  Date Value Ref Range Status   01/08/2024 33.2 (H) 27.0 - 33.0 pg Final   MCV  Date Value Ref Range Status  01/08/2024 100.5 (H) 80.0 - 100.0 fL Final   No results found for: PLTCOUNTKUC, LABPLAT, POCPLA RDW  Date Value Ref Range Status  01/08/2024 14.2 11.0 - 15.0 % Final          amLODipine  (NORVASC ) 10 MG tablet [Pharmacy Med Name: AMLODIPINE  BESYLATE 10 MG TAB] 30 tablet 5    Sig: TAKE 1 TABLET BY MOUTH ONCE DAILY     Cardiovascular: Calcium  Channel Blockers 2 Failed - 06/04/2024  1:24 PM      Failed - Last BP in normal range    BP Readings from Last 1 Encounters:  06/02/24 (!) 136/94         Passed - Last Heart Rate in normal range    Pulse Readings from Last 1 Encounters:  05/12/24 79         Passed - Valid encounter within last 6 months    Recent Outpatient Visits           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)   [place] [place], Marsa PARAS, DO   1 month ago Chronic bilateral low back pain with bilateral sciatica   [place] [facility], Marsa PARAS, DO   3 months ago Chronic pain of left knee   [facility] [place], Marsa PARAS, DO   4 months ago Chronic pain of left knee   [facility] Edman Marsa PARAS, DO   4 months ago Annual physical exam   [place] [facility] [place], Marsa PARAS, DO       Future Appointments             In 4 weeks Edman, Marsa PARAS, DO [place] [facility], [place]

## 2024-06-09 ENCOUNTER — Other Ambulatory Visit: Payer: Self-pay | Admitting: Family Medicine

## 2024-06-09 DIAGNOSIS — M5030 Other cervical disc degeneration, unspecified cervical region: Secondary | ICD-10-CM

## 2024-06-09 DIAGNOSIS — G8929 Other chronic pain: Secondary | ICD-10-CM

## 2024-06-09 DIAGNOSIS — M1712 Unilateral primary osteoarthritis, left knee: Secondary | ICD-10-CM

## 2024-06-11 ENCOUNTER — Ambulatory Visit
Admission: RE | Admit: 2024-06-11 | Discharge: 2024-06-11 | Disposition: A | Discharge status: Home / Self Care | Source: Ambulatory Visit | Attending: Neurosurgery | Admitting: Neurosurgery

## 2024-06-11 DIAGNOSIS — G959 Disease of spinal cord, unspecified: Secondary | ICD-10-CM

## 2024-06-11 DIAGNOSIS — M5126 Other intervertebral disc displacement, lumbar region: Secondary | ICD-10-CM | POA: Diagnosis not present

## 2024-06-11 DIAGNOSIS — M47816 Spondylosis without myelopathy or radiculopathy, lumbar region: Secondary | ICD-10-CM | POA: Diagnosis not present

## 2024-06-11 MED ORDER — GADOPICLENOL 0.5 MMOL/ML IV SOLN
10.0000 mL | Freq: Once | INTRAVENOUS | Status: AC | PRN
  Administered 2024-06-11: 10 mL via INTRAVENOUS

## 2024-06-11 NOTE — Telephone Encounter (Signed)
 Patient calling for an update on his refill request. Advised still pending. Can take up to 3 business days

## 2024-06-11 NOTE — Telephone Encounter (Signed)
 Requested medication (s) are due for refill today: yes  Requested medication (s) are on the active medication list: yes  Last refill:  06/02/24  Future visit scheduled: yes  Notes to clinic:  Unable to refill per protocol, cannot delegate.      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[facility])   [place] [facility] [place], Marsa PARAS, DO   1 month ago Chronic bilateral low back pain with bilateral sciatica   [place] [facility] [place], Marsa PARAS, DO   3 months ago Chronic pain of left knee   [facility] Ed[place] Marsa PARAS, DO   4 months ago Chronic pain of left knee   [facility] Edman Marsa PARAS, DO   4 months ago Annual physical exam   [place] [facility] Edman Marsa PARAS, DO       Future Appointments             In 3 weeks Edman, Marsa PARAS, DO [place] [facility], Main [address]

## 2024-06-16 ENCOUNTER — Telehealth: Payer: Self-pay | Admitting: Neurosurgery

## 2024-06-16 NOTE — Telephone Encounter (Signed)
 Patient is scheduled for 06/30/2024. He states that his pain is 10/10 and has moved into both legs. He also states that he is unable to work due to his pain. He finally states he needs a refill of Methocarbamol to be sent to [facility] in [place]. He would like to know what he can do for his pain in the meantime. Please advise.

## 2024-06-17 ENCOUNTER — Encounter: Payer: Self-pay | Admitting: Neurosurgery

## 2024-06-17 MED ORDER — METHOCARBAMOL 500 MG PO TABS: 500.0000 mg | ORAL_TABLET | Freq: Four times a day (QID) | ORAL | 0 refills | Status: DC | PRN

## 2024-06-17 NOTE — Telephone Encounter (Signed)
 Spoke to patient and sent in the Methocarbamol.

## 2024-06-19 ENCOUNTER — Other Ambulatory Visit: Payer: Self-pay | Admitting: Family Medicine

## 2024-06-19 DIAGNOSIS — M5030 Other cervical disc degeneration, unspecified cervical region: Secondary | ICD-10-CM

## 2024-06-19 DIAGNOSIS — M1712 Unilateral primary osteoarthritis, left knee: Secondary | ICD-10-CM

## 2024-06-19 DIAGNOSIS — G8929 Other chronic pain: Secondary | ICD-10-CM

## 2024-06-20 NOTE — Telephone Encounter (Signed)
 Requested medication (s) are due for refill today: Yes  Requested medication (s) are on the active medication list: Yes  Last refill:  06/11/24  Future visit scheduled: Yes  Notes to clinic:  Unable to refill per protocol, cannot delegate.      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[facility])   [place] [facility] [place], Marsa PARAS, DO   2 months ago Chronic bilateral low back pain with bilateral sciatica   [place] [facility] [place], Marsa PARAS, DO   3 months ago Chronic pain of left knee   [facility] Ed[facility] Marsa PARAS, DO   5 months ago Chronic pain of left knee   [facility] Ed[place] Marsa PARAS, DO   5 months ago Annual physical exam   [place] [facility] Edman Marsa PARAS, DO       Future Appointments             In 1 week Edman, Marsa PARAS, DO  [facility], Main [address]

## 2024-06-22 ENCOUNTER — Telehealth: Payer: Self-pay

## 2024-06-22 NOTE — Telephone Encounter (Signed)
Second notification on this

## 2024-06-22 NOTE — Telephone Encounter (Signed)
 Copied from CRM [id number]. Topic: Clinical - Medication Question >> Jun 22, 2024  8:58 AM Anairis L wrote: Reason for CRM: Patient folloeing up on medication refill. HYDROcodone -Acetaminophen  1 tablet Oral Every 4 hours PRN

## 2024-06-26 ENCOUNTER — Other Ambulatory Visit: Payer: Self-pay | Admitting: Family Medicine

## 2024-06-30 ENCOUNTER — Other Ambulatory Visit: Payer: Self-pay | Admitting: Family Medicine

## 2024-06-30 ENCOUNTER — Ambulatory Visit (INDEPENDENT_AMBULATORY_CARE_PROVIDER_SITE_OTHER): Admitting: Neurosurgery

## 2024-06-30 VITALS — BP 124/82 | Ht 72.0 in | Wt 215.4 lb

## 2024-06-30 DIAGNOSIS — M1712 Unilateral primary osteoarthritis, left knee: Secondary | ICD-10-CM

## 2024-06-30 DIAGNOSIS — M5127 Other intervertebral disc displacement, lumbosacral region: Secondary | ICD-10-CM

## 2024-06-30 DIAGNOSIS — M5416 Radiculopathy, lumbar region: Secondary | ICD-10-CM

## 2024-06-30 DIAGNOSIS — M5030 Other cervical disc degeneration, unspecified cervical region: Secondary | ICD-10-CM

## 2024-06-30 DIAGNOSIS — G8929 Other chronic pain: Secondary | ICD-10-CM
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
## 2024-07-01 ENCOUNTER — Other Ambulatory Visit: Payer: Self-pay | Admitting: Internal Medicine

## 2024-07-01 ENCOUNTER — Telehealth: Payer: Self-pay

## 2024-07-01 ENCOUNTER — Ambulatory Visit: Payer: Self-pay | Admitting: Family Medicine

## 2024-07-01 DIAGNOSIS — G8929 Other chronic pain: Secondary | ICD-10-CM

## 2024-07-01 DIAGNOSIS — M1712 Unilateral primary osteoarthritis, left knee: Secondary | ICD-10-CM

## 2024-07-01 DIAGNOSIS — M5030 Other cervical disc degeneration, unspecified cervical region: Secondary | ICD-10-CM

## 2024-07-01 DIAGNOSIS — F5104 Psychophysiologic insomnia: Secondary | ICD-10-CM

## 2024-07-01 MED ORDER — METHOCARBAMOL 500 MG PO TABS: 500.0000 mg | ORAL_TABLET | Freq: Four times a day (QID) | ORAL | 0 refills | Status: DC | PRN

## 2024-07-01 MED ORDER — HYDROCODONE-ACETAMINOPHEN 10-325 MG PO TABS
1.0000 | ORAL_TABLET | ORAL | 0 refills | Status: DC | PRN
Start: 2024-07-01 — End: 2024-07-10

## 2024-07-01 NOTE — Telephone Encounter (Signed)
 Requested medication (s) are due for refill today: no last signed 07/01/24  Requested medication (s) are on the active medication list: yes   Last refill:  07/01/24 #56 0 refills  Future visit scheduled: yes in 2 days   Notes to clinic:   not delegated per protocol. Rx already signed 07/01/24.     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 Edman Marsa PARAS, DO   5 months ago Chronic pain of left knee   [facility] Edman Marsa PARAS, DO   5 months ago Annual physical exam   [place] [facility] [place], Marsa PARAS, DO       Future Appointments             In 2 days Edman, Marsa PARAS, DO [place] [facility], [address]

## 2024-07-01 NOTE — Telephone Encounter (Signed)
 Refill request already sent to dr Edman

## 2024-07-01 NOTE — Telephone Encounter (Signed)
 Signed orders for his med refills. Rx Hydrocodone  signed, he can pick up. Keep apt 11/7  Marsa Officer, DO [facility] 07/01/2024, 9:26 AM

## 2024-07-01 NOTE — Telephone Encounter (Signed)
 Pt was seen by neurosurgery yesterday and has appt 07/03/24 with PCP.   Requesting refill, pended. Today is day 10 from previous refill for 10 days.   FYI Only or Action Required?: Action required by provider: medication refill request.  Patient was last seen in primary care on 05/12/2024 by Edman Marsa PARAS, DO.  Called Nurse Triage reporting Pain.  Symptoms began chronic.  Interventions attempted: Rest, hydration, or home remedies.  Symptoms are: unchanged.  Triage Disposition: Discuss With PCP and Callback by Nurse Today (overriding See HCP Within 4 Hours (Or PCP Triage))  Patient/caregiver understands and will follow disposition?:  Reason for Disposition  [1] Pain radiates into the thigh or further down the leg AND [2] both legs  Answer Assessment - Initial Assessment Questions L4 and L5 herniation, requesting refill of medication, pended today.   1. ONSET: When did the pain begin? (e.g., minutes, hours, days)     Chronic 2. LOCATION: Where does it hurt? (upper, mid or lower back)     Back, radiates to both legs 3. SEVERITY: How bad is the pain?  (e.g., Scale 1-10; mild, moderate, or severe)     Severe 4. PATTERN: Is the pain constant? (e.g., yes, no; constant, intermittent)      Constant 5. RADIATION: Does the pain shoot into your legs or somewhere else?     To legs 6. CAUSE:  What do you think is causing the back pain?      Disc herniation  Protocols used: Back Pain-A-AH Copied from CRM #[id number]. Topic: Clinical - Red Word Triage >> Jul 01, 2024  8:34 AM Treva T wrote: [facility] that prompted transfer to Nurse Triage: Patient calling, reports he is in severe pain, and unable to bend his leg due to pain.  Patient reports he is out of his pain medication, Hydrocodone , and due to this his pain is worsening, causing mobility issues.

## 2024-07-01 NOTE — Telephone Encounter (Signed)
 Copied from CRM #[id number]. Topic: Clinical - Medication Question >> Jul 01, 2024  8:30 AM Treva T wrote: Reason for CRM: Patient calling to inquire if medication refill has been sent to pharmacy.  Medication: HYDROcodone -acetaminophen  (NORCO) 10-325 MG tablet  Per chart review, medication refill status is: Earliest Fill Date: 06/30/2024 06/30/24 0804 Pending  Patient requesting medication be sent to pharmacy, as per pharmacy has not received refill.   [facility] [place], [place] - [address] [place] [address] Phone: [phone] Fax: [phone]

## 2024-07-02 NOTE — Telephone Encounter (Signed)
 Requested medication (s) are due for refill today: yes  Requested medication (s) are on the active medication list: yes  Last refill:  06/04/24  Future visit scheduled: yes  Notes to clinic:  Unable to refill per protocol, cannot delegate.      Requested Prescriptions  Pending Prescriptions Disp Refills   QUEtiapine  (SEROQUEL ) 50 MG tablet [Pharmacy Med Name: QUETIAPINE  FUMARATE 50 MG TAB] 30 tablet 0    Sig: TAKE 1 TABLET BY MOUTH AT BEDTIME     Not Delegated - Psychiatry:  Antipsychotics - Second Generation (Atypical) - quetiapine  Failed - 07/02/2024  4:25 PM      Failed - This refill cannot be delegated      Failed - Lipid Panel in normal range within the last 12 months    Cholesterol  Date Value Ref Range Status  01/08/2024 179 <200 mg/dL Final   LDL Cholesterol (Calc)  Date Value Ref Range Status  01/08/2024 89 mg/dL (calc) Final    Comment:    Reference range: <100 . Desirable range <100 mg/dL for primary prevention;   <70 mg/dL for patients with CHD or diabetic patients  with > or = 2 CHD risk factors. SABRA LDL-C is now calculated using the Martin-Hopkins  calculation, which is a validated novel method providing  better accuracy than the Friedewald equation in the  estimation of LDL-C.  Gladis APPLETHWAITE et al. SANDREA. 7986;689(80): 2061-2068  (http://education.Quest[employer].com/faq/FAQ164)    HDL  Date Value Ref Range Status  01/08/2024 65 > OR = 40 mg/dL Final   Triglycerides  Date Value Ref Range Status  01/08/2024 150 (H) <150 mg/dL Final         Passed - TSH in normal range and within 360 days    TSH  Date Value Ref Range Status  01/08/2024 1.20 0.40 - 4.50 mIU/L Final         Passed - Completed PHQ-2 or PHQ-9 in the last 360 days      Passed - Last BP in normal range    BP Readings from Last 1 Encounters:  06/30/24 124/82         Passed - Last Heart Rate in normal range    Pulse Readings from Last 1 Encounters:  05/12/24 79         Passed -  Valid encounter within last 6 months    Recent Outpatient Visits           1 month ago 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[facility])   [place] [facility] [place], Marsa PARAS, DO   2 months ago Chronic bilateral low back pain with bilateral sciatica   [place] [facility] [place], Marsa PARAS, DO   4 months ago Chronic pain of left knee   [place] [facility] [place], Marsa PARAS, DO   5 months ago Chronic pain of left knee   [place] [facility] [place] Marsa PARAS, DO   5 months ago Annual physical exam   [facility] [place], Marsa PARAS, DO       Future Appointments             Tomorrow [place] Marsa PARAS, DO [place] [facility], [address]            Passed - CBC within normal limits and completed in the last 12 months    WBC  Date Value Ref Range Status  01/08/2024 7.0 3.8 -  10.8 Thousand/uL Final   RBC  Date Value Ref Range Status  01/08/2024 4.07 (L) 4.20 - 5.80 Million/uL Final   Hemoglobin  Date Value Ref Range Status  01/08/2024 13.5 13.2 - 17.1 g/dL Final   HCT  Date Value Ref Range Status  01/08/2024 40.9 38.5 - 50.0 % Final   MCHC  Date Value Ref Range Status  01/08/2024 33.0 32.0 - 36.0 g/dL Final    Comment:    For adults, a slight decrease in the calculated MCHC value (in the range of 30 to 32 g/dL) is most likely not clinically significant; however, it should be interpreted with caution in correlation with other red cell parameters and the patient's clinical condition.    [facility]  Date Value Ref Range Status  01/08/2024 33.2 (H) 27.0 - 33.0 pg Final   MCV  Date Value Ref Range Status  01/08/2024 100.5 (H) 80.0 - 100.0 fL Final   No results found for: PLTCOUNTKUC, LABPLAT, POCPLA RDW  Date Value Ref Range Status  01/08/2024 14.2 11.0 - 15.0 % Final         Passed - CMP within normal limits and  completed in the last 12 months    Albumin  Date Value Ref Range Status  04/18/2023 3.4 (L) 3.5 - 5.0 g/dL Final   Alkaline Phosphatase  Date Value Ref Range Status  04/18/2023 90 38 - 126 U/L Final   Alkaline phosphatase (APISO)  Date Value Ref Range Status  01/08/2024 85 35 - 144 U/L Final   ALT  Date Value Ref Range Status  01/08/2024 12 9 - 46 U/L Final   AST  Date Value Ref Range Status  01/08/2024 13 10 - 35 U/L Final   BUN  Date Value Ref Range Status  01/08/2024 20 7 - 25 mg/dL Final   Calcium   Date Value Ref Range Status  01/08/2024 9.3 8.6 - 10.3 mg/dL Final   CO2  Date Value Ref Range Status  01/08/2024 29 20 - 32 mmol/L Final   Creat  Date Value Ref Range Status  01/08/2024 0.85 0.70 - 1.30 mg/dL Final   Creatinine, Urine  Date Value Ref Range Status  01/16/2024 27 20 - 320 mg/dL Final   Glucose, Bld  Date Value Ref Range Status  01/08/2024 223 (H) 65 - 139 mg/dL Final    Comment:    .        Non-fasting reference interval .    Glucose-Capillary  Date Value Ref Range Status  01/03/2024 113 (H) 70 - 99 mg/dL Final    Comment:    Glucose reference range applies only to samples taken after fasting for at least 8 hours.   Potassium  Date Value Ref Range Status  01/08/2024 4.1 3.5 - 5.3 mmol/L Final   Sodium  Date Value Ref Range Status  01/08/2024 140 135 - 146 mmol/L Final   Total Bilirubin  Date Value Ref Range Status  01/08/2024 0.3 0.2 - 1.2 mg/dL Final   Bilirubin, Direct  Date Value Ref Range Status  07/28/2021 0.1 0.0 - 0.2 mg/dL Final   Indirect Bilirubin  Date Value Ref Range Status  07/28/2021 0.5 0.3 - 0.9 mg/dL Final    Comment:    Performed at [facility], [address] [place], [place] [address]   Protein, ur  Date Value Ref Range Status  03/10/2023 30 (A) NEGATIVE mg/dL Final   Total Protein  Date Value Ref Range Status  01/08/2024 5.9 (L) 6.1 -  8.1 g/dL Final   GFR, Est African American  Date  Value Ref Range Status  06/03/2019 118 > OR = 60 mL/min/1.71m2 Final   eGFR  Date Value Ref Range Status  01/08/2024 105 > OR = 60 mL/min/1.21m2 Final   GFR, Est Non African American  Date Value Ref Range Status  06/03/2019 102 > OR = 60 mL/min/1.44m2 Final   GFR, Estimated  Date Value Ref Range Status  07/12/2023 >60 >60 mL/min Final    Comment:    (NOTE) Calculated using the CKD-EPI Creatinine Equation (2021)

## 2024-07-03 ENCOUNTER — Encounter: Payer: Self-pay | Admitting: Family Medicine

## 2024-07-03 ENCOUNTER — Other Ambulatory Visit: Payer: Self-pay | Admitting: Family Medicine

## 2024-07-03 ENCOUNTER — Ambulatory Visit: Admitting: Family Medicine

## 2024-07-03 VITALS — BP 140/82 | HR 75 | Ht 72.0 in | Wt 217.4 lb

## 2024-07-03 DIAGNOSIS — E1159 Type 2 diabetes mellitus with other circulatory complications: Secondary | ICD-10-CM | POA: Diagnosis not present

## 2024-07-03 DIAGNOSIS — F5104 Psychophysiologic insomnia: Secondary | ICD-10-CM

## 2024-07-03 DIAGNOSIS — Z23 Encounter for immunization: Secondary | ICD-10-CM

## 2024-07-03 DIAGNOSIS — M5442 Lumbago with sciatica, left side: Secondary | ICD-10-CM

## 2024-07-03 DIAGNOSIS — G9581 Conus medullaris syndrome: Secondary | ICD-10-CM

## 2024-07-03 DIAGNOSIS — M5126 Other intervertebral disc displacement, lumbar region: Secondary | ICD-10-CM

## 2024-07-03 DIAGNOSIS — I2510 Atherosclerotic heart disease of native coronary artery without angina pectoris: Secondary | ICD-10-CM

## 2024-07-03 DIAGNOSIS — G8929 Other chronic pain: Secondary | ICD-10-CM

## 2024-07-03 DIAGNOSIS — M5441 Lumbago with sciatica, right side: Secondary | ICD-10-CM

## 2024-07-03 DIAGNOSIS — Z7984 Long term (current) use of oral hypoglycemic drugs: Secondary | ICD-10-CM

## 2024-07-03 DIAGNOSIS — M069 Rheumatoid arthritis, unspecified: Secondary | ICD-10-CM

## 2024-07-03 DIAGNOSIS — F3341 Major depressive disorder, recurrent, in partial remission: Secondary | ICD-10-CM

## 2024-07-03 DIAGNOSIS — I10 Essential (primary) hypertension: Secondary | ICD-10-CM | POA: Diagnosis not present

## 2024-07-03 LAB — POCT GLYCOSYLATED HEMOGLOBIN (HGB A1C): Hemoglobin A1C: 6.5 % — AB (ref 4.0–5.6)

## 2024-07-03 MED ORDER — CARVEDILOL 12.5 MG PO TABS: 12.5000 mg | ORAL_TABLET | Freq: Two times a day (BID) | ORAL | 5 refills | Status: AC

## 2024-07-03 MED ORDER — QUETIAPINE FUMARATE 50 MG PO TABS: 75.0000 mg | ORAL_TABLET | Freq: Every day | ORAL | 5 refills | Status: AC

## 2024-07-03 NOTE — Patient Instructions (Addendum)
 Thank you for coming to the office today.  Flu shot today  Increase Seroquel  from 50 to 75mg , ordered 45 pills for 1.5 if you need to take single pill that is fine.  Carvedilol  refilled  Keep up with Dr Clois  Referral sent. Call to check status.  [facility]   Address: [address] [place], [place] [address] Phone: [phone]  Website: [email or web address]   Please schedule a Follow-up Appointment to: Return if symptoms worsen or fail to improve.  If you have any other questions or concerns, please feel free to call the office or send a message through MyChart. You may also schedule an earlier appointment if necessary.  Additionally, you may be receiving a survey about your experience at our office within a few days to 1 week by e-mail or mail. We value your feedback.  Marsa Officer, DO [facility], [place]

## 2024-07-03 NOTE — Progress Notes (Signed)
 Subjective:    Patient ID: Tyler Martinez, male    DOB: 1971/04/26, 53 y.o.   MRN: [id number]  Tyler Martinez is a 53 y.o. male presenting on 07/03/2024 for Medical Management of Chronic Issues   HPI  Discussed the use of AI scribe software for clinical note transcription with the patient, who gave verbal consent to proceed.  History of Present Illness   Tyler Martinez is a 53 year old male with diabetes and back pain who presents for follow-up on his diabetes management and back pain.   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 - Currently taking Seroquel  50 mg at bedtime, prefers a higher dose and sometimes takes more than prescribed - Takes carvedilol , two tablets at night, for blood pressure management - Recent incident of carvedilol  pills being mixed up with Seroquel , causing difficulty in managing medication regimen  CHRONIC DM, Type 2: - Diabetes is well controlled with an A1c of 6.5%, consistent over the past year (previous readings: 6.8% and 6.5%) - Manages diabetes with metformin  and regular blood glucose monitoring - No recent eye exam despite history of strokes and peripheral vision issues Meds: Metformin  1000mg  BID Reports  good compliance. Tolerating well w/o side-effects Currently on ACEi Denies hypoglycemia, polyuria, visual changes, numbness or tingling.  Lumbar Spine DDD, Disc Herniation  Conus Medullaris Syndrome  Other areas of spine Cervical with radiculitis DDD Severe back pain and neurological symptoms - Severe back pain localized to the mid-lumbar and sacral region, specifically at L4, L5, and S1 vertebrae. Other imaging of C and T spine as well. - Pain radiates down both arms and legs, worsened by improper sitting posture - Significant functional impairment, including missed workdays and exacerbation of symptoms after physical exertion (e.g., pushing a truck) - Pain significantly impacts ability to work and daily activities    Followed now by Dr Clois Pack Neurosurgery spine - Diagnosed with disc issues at L4-5 and S1, including L5-S1 disc herniation - pursuing treatment plan per specialist office   Neuropathic pain management and medication tolerance - Takes gabapentin  for neuropathic pain - Prefers 400 mg capsules over 800 mg tablets due to better gastrointestinal tolerance - Dosing regimen: two 400 mg capsules four times daily, which he finds more effective and less irritating    Pain management and medication use - Current pain management regimen includes muscle relaxers (ineffective) and hydrocodone  10/325 mg per day  up to 4 pills per day  - Previously trialed prednisone  with temporary relief; open to retrying prednisone  for symptom control - Expresses frustration with inadequate pain control and the financial burden of ongoing pain management and potential surgery    Chronic knee pain - Chronic knee pain persisting for years Followed by [facility] Dr Gini GLENWOOD Comer intra-articular injections   Rheumatoid Arthritis / Inflammatory arthritis See chart and prior imaging He is managed for pain management and following orthopedic/neurosurgery at this time       07/03/2024    1:26 PM 05/12/2024    9:12 AM 04/17/2024   10:22 AM  Depression screen PHQ 2/9  Decreased Interest 0 1 1  Down, Depressed, Hopeless 0 0 0  PHQ - 2 Score 0 1 1  Altered sleeping 0 0 1  Tired, decreased energy 1 1 0  Change in appetite   1  Feeling bad or failure about yourself  0 0 0  Trouble concentrating 0 1 0  Moving slowly or fidgety/restless 0 0 0  Suicidal thoughts  0 0 0  PHQ-9 Score 1 3  3    Difficult doing work/chores Not difficult at all       Data saved with a previous flowsheet row definition       07/03/2024    1:27 PM 05/12/2024    9:13 AM 04/17/2024   10:21 AM 03/03/2024    1:57 PM  GAD 7 : Generalized Anxiety Score  Nervous, Anxious, on Edge 0 0 1 1  Control/stop worrying 0 0 1 0  Worry too much  - different things 0 1 1 2   Trouble relaxing 0 1 0 3  Restless 0 0 0 2  Easily annoyed or irritable 0 0 0 1  Afraid - awful might happen 0 0 0 1  Total GAD 7 Score 0 2 3 10   Anxiety Difficulty    Somewhat difficult    Social History   Tobacco Use   Smoking status: Every Day    Current packs/day: 1.00    Average packs/day: 1 pack/day for 8.0 years (8.0 ttl pk-yrs)    Types: Cigarettes   Smokeless tobacco: Never  Vaping Use   Vaping status: Never Used  Substance Use Topics   Alcohol use: Not Currently    Alcohol/week: 2.0 standard drinks of alcohol    Types: 2 Standard drinks or equivalent per week    Comment: Drinks vodka every evening   Drug use: Not Currently    Types: Amphetamines    Review of Systems Per HPI unless specifically indicated above     Objective:    BP (!) 140/82 (BP Location: Left Arm, Patient Position: Sitting, Cuff Size: Normal)   Pulse 75   Ht 6' (1.829 m)   Wt 217 lb 6 oz (98.6 kg)   SpO2 95%   BMI 29.48 kg/m   Wt Readings from Last 3 Encounters:  07/03/24 217 lb 6 oz (98.6 kg)  06/30/24 215 lb 6.4 oz (97.7 kg)  06/02/24 212 lb 2 oz (96.2 kg)    Physical Exam Vitals and nursing note reviewed.  Constitutional:      General: He is not in acute distress.    Appearance: He is well-developed. He is not diaphoretic.     Comments: Well-appearing, comfortable, cooperative  HENT:     Head: Normocephalic and atraumatic.  Eyes:     General:        Right eye: No discharge.        Left eye: No discharge.     Conjunctiva/sclera: Conjunctivae normal.  Neck:     Thyroid : No thyromegaly.  Cardiovascular:     Rate and Rhythm: Normal rate and regular rhythm.     Pulses: Normal pulses.     Heart sounds: Normal heart sounds. No murmur heard. Pulmonary:     Effort: Pulmonary effort is normal. No respiratory distress.     Breath sounds: Normal breath sounds. No wheezing or rales.  Musculoskeletal:     Cervical back: Normal range of motion and neck  supple.     Comments: Stiffness in back with range of motion limited forward flex, stiff from sitting, difficulty with ambulation and abnormal gait pattern limited hip flexion and mobility  Lymphadenopathy:     Cervical: No cervical adenopathy.  Skin:    General: Skin is warm and dry.     Findings: No erythema or rash.  Neurological:     Mental Status: He is alert and oriented to person, place, and time. Mental status is at baseline.  Psychiatric:  Behavior: Behavior normal.     Comments: Well groomed, good eye contact, normal speech and thoughts     I have personally reviewed the radiology report from 06/11/24 on Lumbar MRI.   EXAM: MR Lumbar Spine with and without intravenous contrast. 06/11/2024 03:27:38 PM   TECHNIQUE: Multiplanar multisequence MRI of the lumbar spine was performed with and without the administration of intravenous contrast.   COMPARISON: MRI of the lumbar spine dated 04/20/2024.   CLINICAL HISTORY: Brain/CNS neoplasm, monitor. Chronic LBP radiating to bilateral buttock and legs with numbness and weakness, left worst than right, progressive loss of movement in his left leg, L5-S1 disc herniation. Duration: several years, pain is increasing. Previous scans in PACS. NKI. No spine surgery. No cancer history. Steroid injection. Patient reports constipation and frequent urination. 10 mL Vueway administered out of 10 mL Vial, 0 mL wasted.   FINDINGS:   BONES AND ALIGNMENT: Normal vertebral body heights. Normal bone marrow signal. No abnormal enhancement. Normal alignment.   SPINAL CORD: The conus terminates normally. A nodular area of contrast enhancement is again demonstrated along the tip of the conus medullaris, best seen on image 8 of series 119. It measures approximately 7 x 5 x 5 mm.   SOFT TISSUES: No acute abnormality.   L1-L2: No disc herniation. No spinal canal stenosis or neural foraminal narrowing.   L2-L3: No disc herniation. No  spinal canal stenosis or neural foraminal narrowing.   L3-L4: No disc herniation. No spinal canal stenosis or neural foraminal narrowing.   L4-L5: Mild diffuse disc bulging resulting in mild central spinal canal stenosis but no nerve root impingement.   L5-S1: A left posterolateral caudal disc extrusion seen on the previous study has receded in the interim. Reactive edema within the superior endplate of S1. No spinal canal stenosis or neural foraminal narrowing.   IMPRESSION: 1. 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 2. Mild diffuse disc bulging at L4-5 resulting in mild central spinal canal stenosis, but no nerve root impingement. 3. Interval resolution of previously noted caudal disc extrusion at L5-S1   Electronically signed by: Evalene Coho MD 06/15/2024 07:01 AM EDT RP Workstation: [id number]  Results for orders placed or performed in visit on 07/03/24  POCT HgB A1C   Collection Time: 07/03/24  1:31 PM  Result Value Ref Range   Hemoglobin A1C 6.5 (A) 4.0 - 5.6 %   HbA1c POC (<> result, manual entry)     HbA1c, POC (prediabetic range)     HbA1c, POC (controlled diabetic range)        Assessment & Plan:   Problem List Items Addressed This Visit     Chronic bilateral low back pain with bilateral sciatica   Relevant Medications   QUEtiapine  (SEROQUEL ) 50 MG tablet   Insomnia   Relevant Medications   QUEtiapine  (SEROQUEL ) 50 MG tablet   Major depressive disorder, recurrent severe without psychotic features (HCC)   Rheumatoid arthritis involving multiple sites (HCC)   Type 2 diabetes mellitus with vascular disease (HCC) - Primary   Relevant Medications   carvedilol  (COREG ) 12.5 MG tablet   Other Relevant Orders   POCT HgB A1C (Completed)   Ambulatory referral to Optometry   Other Visit Diagnoses       Flu vaccine need       Relevant Orders   Flu vaccine trivalent PF, 6mos and  older(Flulaval,Afluria,Fluarix,Fluzone) (Completed)     Essential (primary) hypertension  Relevant Medications   carvedilol  (COREG ) 12.5 MG tablet     Atherosclerotic heart disease of native coronary artery without angina pectoris       Relevant Medications   carvedilol  (COREG ) 12.5 MG tablet     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(HCC)         Lumbar disc herniation         Long term current use of oral hypoglycemic drug             Type 2 diabetes mellitus, well controlled A1c at 6.5, stable over the past year. - Continue current diabetes management regimen including metformin . - Encouraged regular eye exams for diabetes-related complications. Referral to Optometry  Essential hypertension Controlled with carvedilol , two tablets at night. - Continue carvedilol  as currently prescribed, two tablets at night.  Atherosclerotic heart disease of native coronary artery without angina - Continue current management and monitoring.  Lumbago with sciatica and lumbar intervertebral disc displacement, left side Rheumatoid arthritis inflammatory history Chronic pain due to L5-S1 disc herniation and L4-5 displacement. Under neurosurgical care, considering surgical options vs PT currently  Major depression recurrent partial remission Psychophysiologic insomnia Managed with Seroquel , prefers flexible dosing. - Prescribed Seroquel  75 mg (one and a half tablets) with 45 pills to allow for flexible dosing.  Encounter for immunization Received flu shot today.        Orders Placed This Encounter  Procedures   Flu vaccine trivalent PF, 6mos and older(Flulaval,Afluria,Fluarix,Fluzone)   Ambulatory referral to Optometry    Referral Priority:   Routine    Referral Type:   [employer] Officer)    Referral Reason:   Specialty Services Required    Requested Specialty:   Optometry    Number of Visits Requested:   1   POCT HgB A1C    Meds ordered this encounter  Medications   QUEtiapine   (SEROQUEL ) 50 MG tablet    Sig: Take 1.5 tablets (75 mg total) by mouth at bedtime.    Dispense:  45 tablet    Refill:  5    Dose increase. Patient request 30 day only   carvedilol  (COREG ) 12.5 MG tablet    Sig: Take 1 tablet (12.5 mg total) by mouth 2 (two) times daily with a meal.    Dispense:  60 tablet    Refill:  5    Follow up plan: Return if symptoms worsen or fail to improve.   Marsa Officer, DO [facility] 07/03/2024, 1:34 PM

## 2024-07-06 NOTE — Telephone Encounter (Signed)
 Too soon for refill, LRF 04/09/24 FOR 90 , 1 RF.  Requested Prescriptions  Pending Prescriptions Disp Refills   clopidogrel  (PLAVIX ) 75 MG tablet [Pharmacy Med Name: CLOPIDOGREL  BISULFATE 75 MG TAB] 90 tablet 1    Sig: TAKE 1 TABLET BY MOUTH ONCE DAILY     Hematology: Antiplatelets - clopidogrel  Passed - 07/06/2024 12:50 PM      Passed - HCT in normal range and within 180 days    HCT  Date Value Ref Range Status  01/08/2024 40.9 38.5 - 50.0 % Final         Passed - HGB in normal range and within 180 days    Hemoglobin  Date Value Ref Range Status  01/08/2024 13.5 13.2 - 17.1 g/dL Final         Passed - PLT in normal range and within 180 days    Platelets  Date Value Ref Range Status  01/08/2024 284 140 - 400 Thousand/uL Final         Passed - Cr in normal range and within 360 days    Creat  Date Value Ref Range Status  01/08/2024 0.85 0.70 - 1.30 mg/dL Final   Creatinine, Urine  Date Value Ref Range Status  01/16/2024 27 20 - 320 mg/dL Final         Passed - Valid encounter within last 6 months    Recent Outpatient Visits           3 days ago Type 2 diabetes mellitus with vascular disease [facility])   [place] [facility] [place], Marsa PARAS, DO   1 month ago 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[facility])   [place] [facility] [place] Marsa PARAS, DO   2 months ago Chronic bilateral low back pain with bilateral sciatica   [place] [facility] [place], Marsa PARAS, DO   4 months ago Chronic pain of left knee   [facility] [place] Marsa PARAS, DO   5 months ago Chronic pain of left knee   [facility] [place], Marsa PARAS, [place]

## 2024-07-08 ENCOUNTER — Other Ambulatory Visit: Payer: Self-pay | Admitting: Family Medicine

## 2024-07-08 DIAGNOSIS — M1712 Unilateral primary osteoarthritis, left knee: Secondary | ICD-10-CM

## 2024-07-08 DIAGNOSIS — M5030 Other cervical disc degeneration, unspecified cervical region: Secondary | ICD-10-CM

## 2024-07-08 DIAGNOSIS — G8929 Other chronic pain: Secondary | ICD-10-CM

## 2024-07-09 NOTE — Telephone Encounter (Signed)
 Requested medications are due for refill today.  Robaxin will be due11/20/2025, Hydrocodone  will be due 07/11/2024  Requested medications are on the active medications list.  yes  Last refill. Robaxin 07/01/2024 #60 0 rf, Hydrocodone  07/01/2024 #56  Future visit scheduled.   no  Notes to clinic.  Refill/refusal not delegated.    Requested Prescriptions  Pending Prescriptions Disp Refills   methocarbamol (ROBAXIN) 500 MG tablet [Pharmacy Med Name: METHOCARBAMOL 500 MG TAB] 60 tablet 0    Sig: TAKE 1 TABLET BY MOUTH EVERY 6 HOURS AS NEEDED FOR MUSCLE SPASMS     Not Delegated - Analgesics:  Muscle Relaxants Failed - 07/09/2024  5:32 PM      Failed - This refill cannot be delegated      Passed - Valid encounter within last 6 months    Recent Outpatient Visits           6 days ago Type 2 diabetes mellitus with vascular disease [facility])   [place] [facility] [place], Marsa PARAS, DO   1 month ago 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[facility])   [place] [facility] [place], Marsa PARAS, DO   2 months ago Chronic bilateral low back pain with bilateral sciatica   [place] [facility] [place], Marsa PARAS, DO   4 months ago Chronic pain of left knee   [place] [facility], Marsa PARAS, DO   5 months ago Chronic pain of left knee   [facility] [place], Marsa PARAS, DO               HYDROcodone -acetaminophen  (NORCO) 10-325 MG tablet [Pharmacy Med Name: HYDROCODONE -APAP 10-325 MG TAB] 56 tablet     Sig: TAKE 1 TABLET BY MOUTH EVERY 4 HOURS AS NEEDED     Not Delegated - Analgesics:  Opioid Agonist Combinations Failed - 07/09/2024  5:32 PM      Failed - This refill cannot be delegated      Failed - Urine Drug Screen completed in last 360 days      Passed - Valid encounter within last 3 months    Recent Outpatient Visits           6 days ago Type 2 diabetes mellitus  with vascular disease [facility])   [place] [facility] [place], Marsa PARAS, DO   1 month ago 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[facility])   [place] [facility] [place], Marsa PARAS, DO   2 months ago Chronic bilateral low back pain with bilateral sciatica   [place] [facility], Marsa PARAS, DO   4 months ago Chronic pain of left knee   [facility] [place], Marsa PARAS, DO   5 months ago Chronic pain of left knee   [place] [facility] [place], Marsa PARAS, [place]

## 2024-07-10 ENCOUNTER — Other Ambulatory Visit: Payer: Self-pay | Admitting: Family Medicine

## 2024-07-10 DIAGNOSIS — M5412 Radiculopathy, cervical region: Secondary | ICD-10-CM

## 2024-07-10 DIAGNOSIS — G8929 Other chronic pain: Secondary | ICD-10-CM

## 2024-07-10 DIAGNOSIS — M1712 Unilateral primary osteoarthritis, left knee: Secondary | ICD-10-CM

## 2024-07-10 DIAGNOSIS — M5030 Other cervical disc degeneration, unspecified cervical region: Secondary | ICD-10-CM

## 2024-07-10 NOTE — Telephone Encounter (Signed)
 Can you contact patient? Let him know order was signed today. For his Rx  Marsa Officer, DO [facility] 07/10/2024, 1:55 PM

## 2024-07-10 NOTE — Telephone Encounter (Signed)
 Copied from CRM [id number]. Topic: Clinical - Medication Refill >> Jul 10, 2024  9:15 AM Cynthia K wrote: Medication:  HYDROcodone -acetaminophen  (NORCO) 10-325 MG tablet   Has the patient contacted their pharmacy? Yes (Agent: If no, request that the patient contact the pharmacy for the refill. If patient does not wish to contact the pharmacy document the reason why and proceed with request.) (Agent: If yes, when and what did the pharmacy advise?) Pharmacy needs order to refill  This is the patient's preferred pharmacy:  [facility] - [place], [place] - [address] [place] [address] Phone: [phone] Fax: [phone]  Is this the correct pharmacy for this prescription? Yes If no, delete pharmacy and type the correct one.   Has the prescription been filled recently? No  Is the patient out of the medication? No  Has the patient been seen for an appointment in the last year OR does the patient have an upcoming appointment? Yes  Can we respond through MyChart? No  Agent: Please be advised that Rx refills may take up to 3 business days. We ask that you follow-up with your pharmacy.

## 2024-07-10 NOTE — Telephone Encounter (Signed)
 Copied from CRM [id number]. Topic: Clinical - Medication Refill >> Jul 10, 2024  9:01 AM Tiffini S wrote: Medication:  gabapentin  (NEURONTIN ) 400 MG capsul   Has the patient contacted their pharmacy? Yes (Agent: If no, request that the patient contact the pharmacy for the refill. If patient does not wish to contact the pharmacy document the reason why and proceed with request.) (Agent: If yes, when and what did the pharmacy advise?)  This is the patient's preferred pharmacy:  [facility] - [place], [place] - [address] [place] [address] Phone: [phone] Fax: [phone]  Is this the correct pharmacy for this prescription? Yes If no, delete pharmacy and type the correct one.   Has the prescription been filled recently? Yes  Is the patient out of the medication? No, have a few tablets   Has the patient been seen for an appointment in the last year OR does the patient have an upcoming appointment? Yes  Can we respond through MyChart? No, please call the patient at [phone]  Agent: Please be advised that Rx refills may take up to 3 business days. We ask that you follow-up with your pharmacy.

## 2024-07-12 NOTE — Telephone Encounter (Signed)
 Requested medications are due for refill today.  No - pt is requesting refill 1 month early  Requested medications are on the active medications list.  yes  Last refill. 05/12/2024 #240 2 rf  Future visit scheduled.   no  Notes to clinic.  Please review.    Requested Prescriptions  Pending Prescriptions Disp Refills   gabapentin  (NEURONTIN ) 400 MG capsule [Pharmacy Med Name: GABAPENTIN  400 MG CAP] 240 capsule 2    Sig: TAKE 2 CAPSULES BY MOUTH 4 TIMES DAILY     Neurology: Anticonvulsants - gabapentin  Passed - 07/12/2024  1:32 PM      Passed - Cr in normal range and within 360 days    Creat  Date Value Ref Range Status  01/08/2024 0.85 0.70 - 1.30 mg/dL Final   Creatinine, Urine  Date Value Ref Range Status  01/16/2024 27 20 - 320 mg/dL Final         Passed - Completed PHQ-2 or PHQ-9 in the last 360 days      Passed - Valid encounter within last 12 months    Recent Outpatient Visits           1 week ago Type 2 diabetes mellitus with vascular disease [facility])   [place] [facility] [place] [facility]a PARAS, DO   2 months ago 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[facility]   [place] [facility] [place] [facility]a PARAS, DO   2 months ago Chronic bilateral low back pain with bilateral sciatica   [place] [facility] [place], Marsa PARAS, DO   4 months ago Chronic pain of left knee   [facility] [place] Marsa PARAS, DO   5 months ago Chronic pain of left knee   [facility] [place], Marsa PARAS, [place]

## 2024-07-12 NOTE — Telephone Encounter (Signed)
 Requested medications are due for refill today.  No - pt was given a 90 day supply 05/12/2024  - pt is requesting a refill 1 month early  Requested medications are on the active medications list.  yes  Last refill. 05/12/2024 #240 2 rf  Future visit scheduled.   no  Notes to clinic.  Please review for refill.     Requested Prescriptions  Pending Prescriptions Disp Refills   gabapentin  (NEURONTIN ) 400 MG capsule 240 capsule 2    Sig: Take 2 capsules (800 mg total) by mouth 4 (four) times daily.     Neurology: Anticonvulsants - gabapentin  Passed - 07/12/2024  1:11 PM      Passed - Cr in normal range and within 360 days    Creat  Date Value Ref Range Status  01/08/2024 0.85 0.70 - 1.30 mg/dL Final   Creatinine, Urine  Date Value Ref Range Status  01/16/2024 27 20 - 320 mg/dL Final         Passed - Completed PHQ-2 or PHQ-9 in the last 360 days      Passed - Valid encounter within last 12 months    Recent Outpatient Visits           1 week ago Type 2 diabetes mellitus with vascular disease [facility])   [place] [facility] [place] [facility]a PARAS, DO   2 months ago 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[facility])   [place] [facility] [place] [facility] PARAS, DO   2 months ago Chronic bilateral low back pain with bilateral sciatica   [place] [facility] [place], Marsa PARAS, DO   4 months ago Chronic pain of left knee   [facility] [place] Marsa PARAS, DO   5 months ago Chronic pain of left knee   [facility] [place], Marsa PARAS, [place]

## 2024-07-21 ENCOUNTER — Other Ambulatory Visit: Payer: Self-pay | Admitting: Family Medicine

## 2024-07-21 DIAGNOSIS — G8929 Other chronic pain: Secondary | ICD-10-CM

## 2024-07-21 DIAGNOSIS — M5030 Other cervical disc degeneration, unspecified cervical region: Secondary | ICD-10-CM

## 2024-07-21 DIAGNOSIS — I2510 Atherosclerotic heart disease of native coronary artery without angina pectoris: Secondary | ICD-10-CM

## 2024-07-21 DIAGNOSIS — I10 Essential (primary) hypertension: Secondary | ICD-10-CM

## 2024-07-21 DIAGNOSIS — M1712 Unilateral primary osteoarthritis, left knee: Secondary | ICD-10-CM

## 2024-07-21 NOTE — Telephone Encounter (Unsigned)
 Copied from CRM [id number]. Topic: Clinical - Medication Refill >> Jul 21, 2024  3:01 PM Victoria B wrote: Medication: carvedilol  (COREG ) 12.5 MG tablet  Has the patient contacted their pharmacy? yes (Agent: If no, request that the patient contact the pharmacy for the refill. If patient does not wish to contact the pharmacy document the reason why and proceed with request.) (Agent: If yes, when and what did the pharmacy advise?)patient ha pain med requested but not the carvedilol   This is the patient's preferred pharmacy:  [facility] - [place], [place] - [address] [place] [address] Phone: [phone] Fax: [phone]  Is this the correct pharmacy for this prescription? yes    Has the prescription been filled recently? no  Is the patient out of the medication? no  Has the patient been seen for an appointment in the last year OR does the patient have an upcoming appointment? yes  Can we respond through MyChart? yes  Agent: Please be advised that Rx refills may take up to 3 business days. We ask that you follow-up with your pharmacy.

## 2024-07-22 ENCOUNTER — Telehealth: Payer: Self-pay

## 2024-07-22 NOTE — Telephone Encounter (Signed)
 Spoke to patient, he explained t him that he will need to contact the pharmacy for refill on carvedilol  and it may be too early for insurance so he would have to pay out of pocket. Patient stated he dumped the bottle into his shave bag reason for early refill.   Explained to patient that DR Edman has a refill request already on hydrocodone . Waiting for Doctor to refill.

## 2024-07-22 NOTE — Telephone Encounter (Signed)
 Copied from CRM [id number]. Topic: Clinical - Medication Question >> Jul 22, 2024  1:45 PM Sophia H wrote: Reason for CRM: Checking in on refill of hydrocodone  - advised new orders were submitted but not able to fill yet per insurance.  Patient wants to know if there is any way that it can be processed without going through the insurance. Please reach out and advise before EOD. # [phone]  HYDROcodone -acetaminophen  (NORCO) 10-325 MG tablet

## 2024-07-22 NOTE — Telephone Encounter (Signed)
 Copied from CRM #[id number]. Topic: Clinical - Medication Question >> Jul 21, 2024  4:17 PM Shanda MATSU wrote: Reason for CRM: Patient called in to check if med refill req has been completed for meds, carvedilol  (COREG ) 12.5 MG tablet HYDROcodone -acetaminophen  (NORCO) 10-325 MG tablet Adv patient that req for refill has been recvd, is in processing, can take up to 3 bus days to be completed. >> Jul 22, 2024  8:56 AM Ivette P wrote: Pt is following up on request and would like to know if this will be done before weekend.    Please follow up with pt before end of day    [phone]

## 2024-07-22 NOTE — Telephone Encounter (Signed)
 Spoke with patient, notified hydrocodone  prescription sent in to pharmacy

## 2024-07-22 NOTE — Telephone Encounter (Signed)
 Can you call patient and let him know I have sent Hydrocodone  refill today? It should be eligible to be refilled today.  I believe there was confusion, I see 2 telephone notes. One was about Carvedilol  and the other mentions Hydrocodone .  It looks like he was told that he has to wait on Hydrocodone  for insurance to pay. Actually, it was last ordered 07/10/24 so it has been over 9 days, he is eligible for refill - it looks like the refill request came in yesterday and was not fully processed yet. So it was not in my inbox.  I have completed the hydrocodone  refill. He can contact pharmacy now as of 2pm today, and pick it up laterl  Marsa Officer, DO [facility] 07/22/2024, 2:17 PM

## 2024-07-22 NOTE — Telephone Encounter (Signed)
 Requested medication (s) are due for refill today - provider review   Requested medication (s) are on the active medication list -yes  Future visit scheduled -no  Last refill: 07/10/24 #56  Notes to clinic: non delegated Rx  Requested Prescriptions  Pending Prescriptions Disp Refills   HYDROcodone -acetaminophen  (NORCO) 10-325 MG tablet [Pharmacy Med Name: HYDROCODONE -APAP 10-325 MG TAB] 56 tablet     Sig: TAKE 1 TABLET BY MOUTH EVERY 4 HOURS AS NEEDED     Not Delegated - Analgesics:  Opioid Agonist Combinations Failed - 07/22/2024 11:57 AM      Failed - This refill cannot be delegated      Failed - Urine Drug Screen completed in last 360 days      Passed - Valid encounter within last 3 months    Recent Outpatient Visits           2 weeks ago Type 2 diabetes mellitus with vascular disease (HCC)   [place] [facility] Marsa PARAS, DO   2 months ago 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[facility])   [place] [facility] [place], Marsa PARAS, DO   3 months ago Chronic bilateral low back pain with bilateral sciatica   [place] [facility] [place], Marsa PARAS, DO   4 months ago Chronic pain of left knee   [place] [facility] [place], Marsa PARAS, DO   6 months ago Chronic pain of left knee   [place] [facility] Edman Marsa PARAS, DO                 Requested Prescriptions  Pending Prescriptions Disp Refills   HYDROcodone -acetaminophen  (NORCO) 10-325 MG tablet [Pharmacy Med Name: HYDROCODONE -APAP 10-325 MG TAB] 56 tablet     Sig: TAKE 1 TABLET BY MOUTH EVERY 4 HOURS AS NEEDED     Not Delegated - Analgesics:  Opioid Agonist Combinations Failed - 07/22/2024 11:57 AM      Failed - This refill cannot be delegated      Failed - Urine Drug Screen completed in last 360 days      Passed - Valid encounter within last 3 months    Recent Outpatient Visits            2 weeks ago Type 2 diabetes mellitus with vascular disease [facility])   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

## 2024-07-27 ENCOUNTER — Other Ambulatory Visit: Payer: Self-pay | Admitting: Family Medicine

## 2024-07-27 DIAGNOSIS — M1712 Unilateral primary osteoarthritis, left knee: Secondary | ICD-10-CM

## 2024-07-27 DIAGNOSIS — M5030 Other cervical disc degeneration, unspecified cervical region: Secondary | ICD-10-CM

## 2024-07-27 DIAGNOSIS — G8929 Other chronic pain: Secondary | ICD-10-CM

## 2024-07-30 NOTE — Telephone Encounter (Signed)
 Requested medications are due for refill today.  1 day too soon  Requested medications are on the active medications list.  yes  Last refill. 07/22/2024 #56 0 rf  Future visit scheduled.   no  Notes to clinic.  Refill not delegated.    Requested Prescriptions  Pending Prescriptions Disp Refills   HYDROcodone -acetaminophen  (NORCO) 10-325 MG tablet [Pharmacy Med Name: HYDROCODONE -APAP 10-325 MG TAB] 56 tablet     Sig: TAKE 1 TABLET BY MOUTH EVERY 4 HOURS AS NEEDED     Not Delegated - Analgesics:  Opioid Agonist Combinations Failed - 07/30/2024  1:56 PM      Failed - This refill cannot be delegated      Failed - Urine Drug Screen completed in last 360 days      Passed - Valid encounter within last 3 months    Recent Outpatient Visits           3 weeks ago Type 2 diabetes mellitus with vascular disease [facility])   [place] [facility] Marsa PARAS, DO   2 months ago 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

## 2024-07-31 ENCOUNTER — Telehealth: Payer: Self-pay

## 2024-07-31 NOTE — Telephone Encounter (Signed)
 Copied from CRM [id number]. Topic: Clinical - Medication Question >> Jul 31, 2024  9:41 AM Emylou G wrote: Patient called.. pls call him back.. wants clarity are we refill his pain meds?

## 2024-07-31 NOTE — Telephone Encounter (Signed)
 Patient advised and verbalized understanding

## 2024-07-31 NOTE — Telephone Encounter (Signed)
 This was addressed yesterday actually.  He is due for pick up of pain med on Friday, today 12/5. THe order was sent in on 12/4 but fill date is listed on the rx for Friday 12/5   HYDROcodone -acetaminophen  (NORCO) 10-325 MG tablet 1 tablet, Every 4 hours PRN 0 ordered       Summary: Take 1 tablet by mouth every 4 (four) hours as needed., Starting Fri 07/31/2024, Normal Dose, Route, Frequency: 1 tablet, Oral, Every 4 hours PRNStart: 12/05/2025Ordered On: 12/04/2025Pharmacy: [facility] [place], [place] - [address]ReportAdh: Dx Associated: Taking: Long-term: Med Note:        Change Discontinue Directions: Take 1 tablet by mouth every 4 (four) hours as needed. Ordering Department: [facility] Authorized By: Edman Marsa PARAS, DO Dispense: 56 tablet Note to Pharmacy: Fill 07/31/24    Marsa Edman, DO [facility] 07/31/2024, 11:45 AM

## 2024-08-03 ENCOUNTER — Other Ambulatory Visit: Payer: Self-pay | Admitting: Family Medicine

## 2024-08-03 DIAGNOSIS — M5412 Radiculopathy, cervical region: Secondary | ICD-10-CM

## 2024-08-03 DIAGNOSIS — G8929 Other chronic pain: Secondary | ICD-10-CM

## 2024-08-04 ENCOUNTER — Other Ambulatory Visit: Payer: Self-pay | Admitting: Family Medicine

## 2024-08-04 DIAGNOSIS — G8929 Other chronic pain: Secondary | ICD-10-CM

## 2024-08-04 DIAGNOSIS — M1712 Unilateral primary osteoarthritis, left knee: Secondary | ICD-10-CM

## 2024-08-04 DIAGNOSIS — M5030 Other cervical disc degeneration, unspecified cervical region: Secondary | ICD-10-CM

## 2024-08-04 NOTE — Telephone Encounter (Signed)
 The patient called in to check on the status of the refill request and I told him since it was just received yesterday the provider has 48-72 hours to respond and he just said whatever can be done he appreciates it.

## 2024-08-05 NOTE — Telephone Encounter (Signed)
 Requested Prescriptions  Pending Prescriptions Disp Refills   gabapentin  (NEURONTIN ) 400 MG capsule [Pharmacy Med Name: GABAPENTIN  400 MG CAP] 240 capsule 2    Sig: TAKE 2 CAPSULES BY MOUTH 4 TIMES DAILY     Neurology: Anticonvulsants - gabapentin  Passed - 08/05/2024 11:08 AM      Passed - Cr in normal range and within 360 days    Creat  Date Value Ref Range Status  01/08/2024 0.85 0.70 - 1.30 mg/dL Final   Creatinine, Urine  Date Value Ref Range Status  01/16/2024 27 20 - 320 mg/dL Final         Passed - Completed PHQ-2 or PHQ-9 in the last 360 days      Passed - Valid encounter within last 12 months    Recent Outpatient Visits           1 month ago Type 2 diabetes mellitus with vascular disease [facility])   [place] [facility] Marsa PARAS, DO   2 months ago 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[facility])   [place] [facility] [place], Marsa PARAS, DO   3 months ago Chronic bilateral low back pain with bilateral sciatica   [place] [facility] [place], Marsa PARAS, DO   5 months ago Chronic pain of left knee   [facility] Ed[facility] Marsa PARAS, DO   6 months ago Chronic pain of left knee   [facility] [place], Marsa PARAS, [place]

## 2024-08-06 NOTE — Telephone Encounter (Signed)
 Please notify patient that rx refill sent for pain medicine to pharmacy, however the next fill date is Sunday 08/09/24 that is 9 days since his last fill on 07/31/24  Marsa Officer, DO [facility] 08/06/2024, 12:37 PM

## 2024-08-06 NOTE — Telephone Encounter (Signed)
 Requested medication (s) are due for refill today: Yes  Requested medication (s) are on the active medication list: Yes  Last refill:  07/31/24  Future visit scheduled: No  Notes to clinic:  Not delegated.    Requested Prescriptions  Pending Prescriptions Disp Refills   HYDROcodone -acetaminophen  (NORCO) 10-325 MG tablet [Pharmacy Med Name: HYDROCODONE -APAP 10-325 MG TAB] 56 tablet     Sig: TAKE 1 TABLET BY MOUTH EVERY 4 HOURS AS NEEDED     Not Delegated - Analgesics:  Opioid Agonist Combinations Failed - 08/06/2024 10:54 AM      Failed - This refill cannot be delegated      Failed - Urine Drug Screen completed in last 360 days      Passed - Valid encounter within last 3 months    Recent Outpatient Visits           1 month ago Type 2 diabetes mellitus with vascular disease [facility])   [facility] [place], Marsa PARAS, DO   2 months ago 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[facility] [place] [facility])   [place] [facility] [place], Marsa PARAS, DO   3 months ago Chronic bilateral low back pain with bilateral sciatica   [place] [facility] [place], Marsa PARAS, DO   5 months ago Chronic pain of left knee   [facility] Ed[facility] Marsa PARAS, DO   6 months ago Chronic pain of left knee   [facility] [place], Marsa PARAS, [place]

## 2024-08-17 ENCOUNTER — Telehealth: Payer: Self-pay | Admitting: Family Medicine

## 2024-08-17 DIAGNOSIS — M1712 Unilateral primary osteoarthritis, left knee: Secondary | ICD-10-CM

## 2024-08-17 DIAGNOSIS — M5030 Other cervical disc degeneration, unspecified cervical region: Secondary | ICD-10-CM

## 2024-08-17 DIAGNOSIS — G8929 Other chronic pain: Secondary | ICD-10-CM

## 2024-08-18 ENCOUNTER — Telehealth: Payer: Self-pay

## 2024-08-18 NOTE — Telephone Encounter (Signed)
 Copied from CRM #[id number]. Topic: Clinical - Medical Advice >> Aug 18, 2024 12:22 PM Delon DASEN wrote: Reason for CRM: HYDROcodone -acetaminophen  (NORCO) 10-325 MG tablet- checking status of refill

## 2024-08-18 NOTE — Telephone Encounter (Signed)
Patient notified rx sent in.

## 2024-08-18 NOTE — Telephone Encounter (Signed)
 Rx sent, already attached a refill message to inbox. But I have sent the order today for pick up, you can let him know. Thanks!

## 2024-08-18 NOTE — Telephone Encounter (Signed)
 Please notify him that pain medicine Rx order sent to [facility] for pick up today 12/23  Marsa Officer, DO [facility] 08/18/2024, 1:12 PM

## 2024-08-24 ENCOUNTER — Other Ambulatory Visit: Payer: Self-pay | Admitting: Family Medicine

## 2024-08-24 DIAGNOSIS — M5030 Other cervical disc degeneration, unspecified cervical region: Secondary | ICD-10-CM

## 2024-08-24 DIAGNOSIS — G8929 Other chronic pain: Secondary | ICD-10-CM

## 2024-08-24 DIAGNOSIS — M1712 Unilateral primary osteoarthritis, left knee: Secondary | ICD-10-CM

## 2024-08-25 NOTE — Telephone Encounter (Signed)
 Requested medication (s) are due for refill today:   Requested medication (s) are on the active medication list: Yes  Last refill:  08/18/24  Future visit scheduled: No  Notes to clinic:  Not delegated.    Requested Prescriptions  Pending Prescriptions Disp Refills   HYDROcodone -acetaminophen  (NORCO) 10-325 MG tablet [Pharmacy Med Name: HYDROCODONE -APAP 10-325 MG TAB] 56 tablet     Sig: TAKE 1 TABLET BY MOUTH EVERY 4 HOURS AS NEEDED     Not Delegated - Analgesics:  Opioid Agonist Combinations Failed - 08/25/2024  3:27 PM      Failed - This refill cannot be delegated      Failed - Urine Drug Screen completed in last 360 days      Passed - Valid encounter within last 3 months    Recent Outpatient Visits           1 month ago Type 2 diabetes mellitus with vascular disease [facility])   [place] [facility] [place], Marsa PARAS, DO   3 months ago 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

## 2024-08-31 ENCOUNTER — Ambulatory Visit

## 2024-09-01 ENCOUNTER — Other Ambulatory Visit

## 2024-09-01 ENCOUNTER — Ambulatory Visit (INDEPENDENT_AMBULATORY_CARE_PROVIDER_SITE_OTHER)

## 2024-09-01 ENCOUNTER — Ambulatory Visit

## 2024-09-01 VITALS — BP 129/89 | HR 90 | Ht 72.0 in | Wt 210.4 lb

## 2024-09-01 DIAGNOSIS — M25511 Pain in right shoulder: Secondary | ICD-10-CM

## 2024-09-01 DIAGNOSIS — M542 Cervicalgia: Secondary | ICD-10-CM

## 2024-09-01 DIAGNOSIS — G8929 Other chronic pain: Secondary | ICD-10-CM
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
## 2024-09-03 ENCOUNTER — Other Ambulatory Visit: Payer: Self-pay | Admitting: Family Medicine

## 2024-09-03 ENCOUNTER — Inpatient Hospital Stay: Admission: RE | Admit: 2024-09-03 | Source: Ambulatory Visit

## 2024-09-03 DIAGNOSIS — G8929 Other chronic pain: Secondary | ICD-10-CM

## 2024-09-03 DIAGNOSIS — M1712 Unilateral primary osteoarthritis, left knee: Secondary | ICD-10-CM

## 2024-09-03 DIAGNOSIS — E1159 Type 2 diabetes mellitus with other circulatory complications: Secondary | ICD-10-CM

## 2024-09-03 DIAGNOSIS — M5030 Other cervical disc degeneration, unspecified cervical region: Secondary | ICD-10-CM

## 2024-09-03 DIAGNOSIS — I10 Essential (primary) hypertension: Secondary | ICD-10-CM

## 2024-09-03 NOTE — Telephone Encounter (Signed)
 Requested Prescriptions  Pending Prescriptions Disp Refills   HYDROcodone -acetaminophen  (NORCO) 10-325 MG tablet [Pharmacy Med Name: HYDROCODONE -APAP 10-325 MG TAB] 56 tablet     Sig: TAKE 1 TABLET BY MOUTH EVERY 4 HOURS AS NEEDED     Not Delegated - Analgesics:  Opioid Agonist Combinations Failed - 09/03/2024  4:27 PM      Failed - This refill cannot be delegated      Failed - Urine Drug Screen completed in last 360 days      Passed - Valid encounter within last 3 months    Recent Outpatient Visits           2 months ago Type 2 diabetes mellitus with vascular disease (HCC)   [place] [facility] [place], Marsa PARAS, DO   3 months ago 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[facility])   [place] [facility] [place], Marsa PARAS, DO   4 months ago Chronic bilateral low back pain with bilateral sciatica   [place] [facility] [place], Marsa PARAS, DO   6 months ago Chronic pain of left knee   [place] [facility] [place], Marsa PARAS, DO   7 months ago Chronic pain of left knee   [place] [facility] [place], Marsa PARAS, DO               methocarbamol  (ROBAXIN ) 500 MG tablet [Pharmacy Med Name: METHOCARBAMOL  500 MG TAB] 60 tablet 2    Sig: TAKE 1 TABLET BY MOUTH EVERY 6 HOURS AS NEEDED FOR MUSCLE SPASMS     Not Delegated - Analgesics:  Muscle Relaxants Failed - 09/03/2024  4:27 PM      Failed - This refill cannot be delegated      Passed - Valid encounter within last 6 months    Recent Outpatient Visits           2 months ago Type 2 diabetes mellitus with vascular disease [facility])   [place] [facility] [place], Marsa PARAS, DO   3 months ago 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[facility])   [place] [facility] [place], Marsa PARAS, DO   4 months ago Chronic bilateral low back pain with bilateral sciatica   [place] [facility]  [place], Marsa PARAS, DO   6 months ago Chronic pain of left knee   [facility] [place], Marsa PARAS, DO   7 months ago Chronic pain of left knee   [place] [facility] [place], Marsa PARAS, DO               lisinopril  (ZESTRIL ) 40 MG tablet [Pharmacy Med Name: LISINOPRIL  40 MG TAB] 30 tablet 5    Sig: TAKE 1 TABLET BY MOUTH ONCE DAILY     Cardiovascular:  ACE Inhibitors Failed - 09/03/2024  4:27 PM      Failed - Cr in normal range and within 180 days    Creat  Date Value Ref Range Status  01/08/2024 0.85 0.70 - 1.30 mg/dL Final   Creatinine, Urine  Date Value Ref Range Status  01/16/2024 27 20 - 320 mg/dL Final         Failed - K in normal range and within 180 days    Potassium  Date Value Ref Range Status  01/08/2024 4.1 3.5 - 5.3 mmol/L Final         Passed - Patient is not pregnant  Passed - Last BP in normal range    BP Readings from Last 1 Encounters:  09/01/24 129/89         Passed - Valid encounter within last 6 months    Recent Outpatient Visits           2 months ago Type 2 diabetes mellitus with vascular disease [facility])   [place] [facility] [place], Marsa PARAS, DO   3 months ago 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 TAKE WITH A MEAL     Endocrinology:  Diabetes - Biguanides Failed -  09/03/2024  4:27 PM      Failed - B12 Level in normal range and within 720 days    Vitamin B-12  Date Value Ref Range Status  04/19/2022 142 (L) 180 - 914 pg/mL Final    Comment:    (NOTE) This assay is not validated for testing neonatal or myeloproliferative syndrome specimens for Vitamin B12 levels. Performed at [facility], [address] [place], [place] [address]          Passed - Cr in normal range and within 360 days    Creat  Date Value Ref Range Status  01/08/2024 0.85 0.70 - 1.30 mg/dL Final   Creatinine, Urine  Date Value Ref Range Status  01/16/2024 27 20 - 320 mg/dL Final         Passed - HBA1C is between 0 and 7.9 and within 180 days    Hemoglobin A1C  Date Value Ref Range Status  07/03/2024 6.5 (A) 4.0 - 5.6 % Final   Hgb A1c MFr Bld  Date Value Ref Range Status  01/08/2024 6.5 (H) <5.7 % Final    Comment:    For someone without known diabetes, a hemoglobin A1c value of 6.5% or greater indicates that they may have  diabetes and this should be confirmed with a follow-up  test. . For someone with known diabetes, a value <7% indicates  that their diabetes is well controlled and a value  greater than or equal to 7% indicates suboptimal  control. A1c targets should be individualized based on  duration of diabetes, age, comorbid conditions, and  other considerations. . Currently, no consensus exists regarding use of hemoglobin A1c for diagnosis of diabetes for children. .          Passed - eGFR in normal range and within 360 days    GFR, Est African American  Date Value Ref Range Status  06/03/2019 118 > OR = 60 mL/min/1.71m2 Final   GFR, Est Non African American  Date Value Ref Range Status  06/03/2019 102 > OR = 60 mL/min/1.57m2 Final   GFR, Estimated  Date Value Ref Range Status  07/12/2023 >60 >60 mL/min Final    Comment:    (  NOTE) Calculated using the CKD-EPI Creatinine Equation (2021)    eGFR  Date Value Ref Range Status   01/08/2024 105 > OR = 60 mL/min/1.110m2 Final         Passed - Valid encounter within last 6 months    Recent Outpatient Visits           2 months ago Type 2 diabetes mellitus with vascular disease (HCC)   [place] [place], Marsa PARAS, DO   3 months ago 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[facility])   [place] [facility] [place] Mars[place] PARAS, DO   4 months ago Chronic bilateral low back pain with bilateral sciatica   [facility] [place], Marsa PARAS, DO   6 months ago Chronic pain of left knee   [place] [facility] [place] Mars[place] PARAS, DO   7 months ago Chronic pain of left knee   [place] [facility] [place], Marsa PARAS, DO              Passed - CBC within normal limits and completed in the last 12 months    WBC  Date Value Ref Range Status  01/08/2024 7.0 3.8 - 10.8 Thousand/uL Final   RBC  Date Value Ref Range Status  01/08/2024 4.07 (L) 4.20 - 5.80 Million/uL Final   Hemoglobin  Date Value Ref Range Status  01/08/2024 13.5 13.2 - 17.1 g/dL Final   HCT  Date Value Ref Range Status  01/08/2024 40.9 38.5 - 50.0 % Final   MCHC  Date Value Ref Range Status  01/08/2024 33.0 32.0 - 36.0 g/dL Final    Comment:    For adults, a slight decrease in the calculated MCHC value (in the range of 30 to 32 g/dL) is most likely not clinically significant; however, it should be interpreted with caution in correlation with other red cell parameters and the patient's clinical condition.    [facility]  Date Value Ref Range Status  01/08/2024 33.2 (H) 27.0 - 33.0 pg Final   MCV  Date Value Ref Range Status  01/08/2024 100.5 (H) 80.0 - 100.0 fL Final   No results found for: PLTCOUNTKUC, LABPLAT, POCPLA RDW  Date Value Ref Range Status  01/08/2024 14.2 11.0 - 15.0 % Final          amLODipine  (NORVASC ) 10 MG tablet [Pharmacy Med Name: AMLODIPINE   BESYLATE 10 MG TAB] 30 tablet 5    Sig: TAKE 1 TABLET BY MOUTH ONCE DAILY     Cardiovascular: Calcium  Channel Blockers 2 Passed - 09/03/2024  4:27 PM      Passed - Last BP in normal range    BP Readings from Last 1 Encounters:  09/01/24 129/89         Passed - Last Heart Rate in normal range    Pulse Readings from Last 1 Encounters:  09/01/24 90         Passed - Valid encounter within last 6 months    Recent Outpatient Visits           2 months ago Type 2 diabetes mellitus with vascular disease [facility])   [place] [place] [facility] PARAS, DO   3 months ago 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 [place], Marsa PARAS, DO   7 months ago Chronic pain of left knee   [facility] [place], Marsa PARAS, [place]

## 2024-09-03 NOTE — Telephone Encounter (Signed)
 Requested medication (s) are due for refill today - provider review   Requested medication (s) are on the active medication list -yes  Future visit scheduled -no  Last refill: hydrocodone - 08/28/24 #56- duplicate request, non delegated Rx                  Methocarbamol -07/10/24 #60 2RF- non delegated Rx  Notes to clinic: see above  Requested Prescriptions  Pending Prescriptions Disp Refills   HYDROcodone -acetaminophen  (NORCO) 10-325 MG tablet [Pharmacy Med Name: HYDROCODONE -APAP 10-325 MG TAB] 56 tablet     Sig: TAKE 1 TABLET BY MOUTH EVERY 4 HOURS AS NEEDED     Not Delegated - Analgesics:  Opioid Agonist Combinations Failed - 09/03/2024  4:28 PM      Failed - This refill cannot be delegated      Failed - Urine Drug Screen completed in last 360 days      Passed - Valid encounter within last 3 months    Recent Outpatient Visits           2 months ago Type 2 diabetes mellitus with vascular disease (HCC)   [place] [facility]a PARAS, DO   3 months ago 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[facility])   [place] [facility] [place], Marsa PARAS, DO   4 months ago Chronic bilateral low back pain with bilateral sciatica   [place] [facility] [place], Marsa PARAS, DO   6 months ago Chronic pain of left knee   [place] [facility] [place], Marsa PARAS, DO   7 months ago Chronic pain of left knee   [facility] [address] [place], Marsa PARAS, DO               methocarbamol  (ROBAXIN ) 500 MG tablet [Pharmacy Med Name: METHOCARBAMOL  500 MG TAB] 60 tablet 2    Sig: TAKE 1 TABLET BY MOUTH EVERY 6 HOURS AS NEEDED FOR MUSCLE SPASMS     Not Delegated - Analgesics:  Muscle Relaxants Failed - 09/03/2024  4:28 PM      Failed - This refill cannot be delegated      Passed - Valid encounter within last 6 months    Recent Outpatient Visits           2 months ago Type 2 diabetes  mellitus with vascular disease [facility])   [place] [facility] [place], Marsa PARAS, DO   3 months ago 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 [place], Marsa PARAS, DO   6 months ago Chronic pain of left knee   [place] [facility] [place], Marsa PARAS, DO   7 months ago Chronic pain of left knee    [facility] [place], Marsa PARAS, DO              Signed Prescriptions Disp Refills   lisinopril  (ZESTRIL ) 40 MG tablet 30 tablet 2    Sig: TAKE 1 TABLET BY MOUTH ONCE DAILY     Cardiovascular:  ACE Inhibitors Failed - 09/03/2024  4:28 PM      Failed - Cr in normal range and within 180 days    Creat  Date Value Ref Range Status  01/08/2024 0.85 0.70 - 1.30 mg/dL Final   Creatinine,  Urine  Date Value Ref Range Status  01/16/2024 27 20 - 320 mg/dL Final         Failed - K in normal range and within 180 days    Potassium  Date Value Ref Range Status  01/08/2024 4.1 3.5 - 5.3 mmol/L Final         Passed - Patient is not pregnant      Passed - Last BP in normal range    BP Readings from Last 1 Encounters:  09/01/24 129/89         Passed - Valid encounter within last 6 months    Recent Outpatient Visits           2 months ago Type 2 diabetes mellitus with vascular disease [facility])   [place] [facility] [place], Marsa PARAS, DO   3 months ago 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 TAKE WITH A MEAL     Endocrinology:  Diabetes - Biguanides Failed - 09/03/2024  4:28 PM      Failed - B12 Level in normal range and within 720 days    Vitamin B-12  Date Value Ref Range Status  04/19/2022 142 (L) 180 - 914 pg/mL Final    Comment:    (NOTE) This assay is not validated for testing neonatal or myeloproliferative syndrome specimens for Vitamin B12 levels. Performed at [facility], [address] [place], [place] [address]          Passed - Cr in normal range and within 360 days    Creat  Date Value Ref Range Status  01/08/2024 0.85 0.70 - 1.30 mg/dL Final   Creatinine, Urine  Date Value Ref Range Status  01/16/2024 27 20 - 320 mg/dL Final         Passed - HBA1C is between 0 and 7.9 and within 180 days    Hemoglobin A1C  Date Value Ref Range Status  07/03/2024 6.5 (A) 4.0 - 5.6 % Final   Hgb A1c MFr Bld  Date Value Ref Range Status  01/08/2024 6.5 (H) <5.7 % Final    Comment:    For someone without known diabetes, a hemoglobin A1c value of 6.5% or greater indicates that they may have  diabetes and this should be confirmed with a follow-up  test. . For someone with known diabetes, a value <7% indicates  that their diabetes is well controlled and a value  greater than or equal to 7% indicates suboptimal  control. A1c targets should be individualized based on  duration of diabetes, age, comorbid conditions, and  other considerations. . Currently, no consensus exists regarding use of hemoglobin A1c for diagnosis of diabetes for children. .          Passed - eGFR in normal range and within 360 days  GFR, Est African American  Date Value Ref Range Status  06/03/2019 118 > OR = 60 mL/min/1.59m2 Final   GFR, Est Non African  American  Date Value Ref Range Status  06/03/2019 102 > OR = 60 mL/min/1.57m2 Final   GFR, Estimated  Date Value Ref Range Status  07/12/2023 >60 >60 mL/min Final    Comment:    (NOTE) Calculated using the CKD-EPI Creatinine Equation (2021)    eGFR  Date Value Ref Range Status  01/08/2024 105 > OR = 60 mL/min/1.16m2 Final         Passed - Valid encounter within last 6 months    Recent Outpatient Visits           2 months ago Type 2 diabetes mellitus with vascular disease (HCC)   [place] [facility] [place], Marsa PARAS, DO   3 months ago 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[facility])   [place]a PARAS, DO   4 months ago Chronic bilateral low back pain with bilateral sciatica   [place] [facility] [place], Marsa PARAS, DO   6 months ago Chronic pain of left knee   [place] [facility] [place], Marsa PARAS, DO   7 months ago Chronic pain of left knee   [place] [facility] [place], Marsa PARAS, DO              Passed - CBC within normal limits and completed in the last 12 months    WBC  Date Value Ref Range Status  01/08/2024 7.0 3.8 - 10.8 Thousand/uL Final   RBC  Date Value Ref Range Status  01/08/2024 4.07 (L) 4.20 - 5.80 Million/uL Final   Hemoglobin  Date Value Ref Range Status  01/08/2024 13.5 13.2 - 17.1 g/dL Final   HCT  Date Value Ref Range Status  01/08/2024 40.9 38.5 - 50.0 % Final   MCHC  Date Value Ref Range Status  01/08/2024 33.0 32.0 - 36.0 g/dL Final    Comment:    For adults, a slight decrease in the calculated MCHC value (in the range of 30 to 32 g/dL) is most likely not clinically significant; however, it should be interpreted with caution in correlation with other red cell parameters and the patient's clinical condition.    [facility]  Date Value Ref Range Status  01/08/2024 33.2 (H) 27.0 - 33.0 pg Final   MCV   Date Value Ref Range Status  01/08/2024 100.5 (H) 80.0 - 100.0 fL Final   No results found for: PLTCOUNTKUC, LABPLAT, POCPLA RDW  Date Value Ref Range Status  01/08/2024 14.2 11.0 - 15.0 % Final          amLODipine  (NORVASC ) 10 MG tablet 30 tablet 2    Sig: TAKE 1 TABLET BY MOUTH ONCE DAILY     Cardiovascular: Calcium  Channel Blockers 2 Passed - 09/03/2024  4:28 PM      Passed - Last BP in normal range    BP Readings from Last 1 Encounters:  09/01/24 129/89         Passed - Last Heart Rate in normal range    Pulse Readings from Last 1 Encounters:  09/01/24 90         Passed - Valid encounter within last 6 months    Recent Outpatient Visits           2 months ago Type 2 diabetes mellitus with vascular disease (HCC)  [place] [facility] [place], Marsa PARAS, DO   3 months ago 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[facility])   [place] [facility] [place], Marsa PARAS, DO   4 months ago Chronic bilateral low back pain with bilateral sciatica   [place] [facility] [place], Marsa PARAS, DO   6 months ago Chronic pain of left knee   [place] [facility] Marsa PARAS, DO   7 months ago Chronic pain of left knee   [place] [facility] [place] Marsa PARAS, DO                 Requested Prescriptions  Pending Prescriptions Disp Refills   HYDROcodone -acetaminophen  (NORCO) 10-325 MG tablet [Pharmacy Med Name: HYDROCODONE -APAP 10-325 MG TAB] 56 tablet     Sig: TAKE 1 TABLET BY MOUTH EVERY 4 HOURS AS NEEDED     Not Delegated - Analgesics:  Opioid Agonist Combinations Failed - 09/03/2024  4:28 PM      Failed - This refill cannot be delegated      Failed - Urine Drug Screen completed in last 360 days      Passed - Valid encounter within last 3 months    Recent Outpatient Visits           2 months ago Type 2 diabetes mellitus with vascular disease (HCC)    [place] [facility] [place], Marsa PARAS, DO   3 months ago 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[facility])   [place] [facility] [place], Marsa PARAS, DO   4 months ago Chronic bilateral low back pain with bilateral sciatica   [place] [facility] [place], Marsa PARAS, DO   6 months ago Chronic pain of left knee   [place] [facility] [place], Marsa PARAS, DO   7 months ago Chronic pain of left knee   [place] [facility] [place], Marsa PARAS, DO               methocarbamol  (ROBAXIN ) 500 MG tablet [Pharmacy Med Name: METHOCARBAMOL  500 MG TAB] 60 tablet 2    Sig: TAKE 1 TABLET BY MOUTH EVERY 6 HOURS AS NEEDED FOR MUSCLE SPASMS     Not Delegated - Analgesics:  Muscle Relaxants Failed - 09/03/2024  4:28 PM      Failed - This refill cannot be delegated      Passed - Valid encounter within last 6 months    Recent Outpatient Visits           2 months ago Type 2 diabetes mellitus with vascular disease [facility])   [place] [facility] [place], Marsa PARAS, DO   3 months ago 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[facility])   [place] [facility] [place], Marsa PARAS, DO   4 months ago Chronic bilateral low back pain with bilateral sciatica   [place] [facility] [place], Marsa PARAS, DO   6 months ago Chronic pain of left knee    [facility] [place], Marsa PARAS, DO   7 months ago Chronic pain of left knee    [facility] [place], Marsa PARAS, DO              Signed Prescriptions Disp Refills   lisinopril  (ZESTRIL ) 40 MG tablet 30 tablet 2    Sig: TAKE 1 TABLET BY MOUTH ONCE DAILY  Cardiovascular:  ACE Inhibitors Failed - 09/03/2024  4:28 PM      Failed - Cr in normal range and within 180 days    Creat  Date Value Ref Range Status  01/08/2024 0.85 0.70 - 1.30 mg/dL  Final   Creatinine, Urine  Date Value Ref Range Status  01/16/2024 27 20 - 320 mg/dL Final         Failed - K in normal range and within 180 days    Potassium  Date Value Ref Range Status  01/08/2024 4.1 3.5 - 5.3 mmol/L Final         Passed - Patient is not pregnant      Passed - Last BP in normal range    BP Readings from Last 1 Encounters:  09/01/24 129/89         Passed - Valid encounter within last 6 months    Recent Outpatient Visits           2 months ago Type 2 diabetes mellitus with vascular disease [facility])   [facility], DO   3 months ago 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 TAKE WITH A MEAL     Endocrinology:  Diabetes - Biguanides Failed - 09/03/2024  4:28 PM      Failed - B12 Level in normal range and within 720 days    Vitamin B-12  Date Value Ref Range Status  04/19/2022 142 (L) 180 - 914 pg/mL Final    Comment:    (NOTE) This assay is not validated for testing neonatal or myeloproliferative syndrome specimens for Vitamin B12 levels. Performed at [facility], [address] [place], [place] [address]          Passed - Cr in normal range and within 360 days    Creat  Date Value Ref Range Status  01/08/2024 0.85 0.70 - 1.30 mg/dL Final   Creatinine, Urine  Date Value Ref Range Status  01/16/2024 27 20 - 320 mg/dL Final         Passed - HBA1C is  between 0 and 7.9 and within 180 days    Hemoglobin A1C  Date Value Ref Range Status  07/03/2024 6.5 (A) 4.0 - 5.6 % Final   Hgb A1c MFr Bld  Date Value Ref Range Status  01/08/2024 6.5 (H) <5.7 % Final    Comment:    For someone without known diabetes, a hemoglobin A1c value of 6.5% or greater indicates that they may have  diabetes and this should be confirmed with a follow-up  test. . For someone with known diabetes, a value <7% indicates  that their diabetes is well controlled and a value  greater than or equal to 7% indicates suboptimal  control. A1c targets should be individualized based on  duration of  diabetes, age, comorbid conditions, and  other considerations. . Currently, no consensus exists regarding use of hemoglobin A1c for diagnosis of diabetes for children. .          Passed - eGFR in normal range and within 360 days    GFR, Est African American  Date Value Ref Range Status  06/03/2019 118 > OR = 60 mL/min/1.42m2 Final   GFR, Est Non African American  Date Value Ref Range Status  06/03/2019 102 > OR = 60 mL/min/1.38m2 Final   GFR, Estimated  Date Value Ref Range Status  07/12/2023 >60 >60 mL/min Final    Comment:    (NOTE) Calculated using the CKD-EPI Creatinine Equation (2021)    eGFR  Date Value Ref Range Status  01/08/2024 105 > OR = 60 mL/min/1.69m2 Final         Passed - Valid encounter within last 6 months    Recent Outpatient Visits           2 months ago Type 2 diabetes mellitus with vascular disease (HCC)   [facility] [place], Marsa PARAS, DO   3 months ago 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[facility])   [place] [facility] Marsa PARAS, DO   4 months ago Chronic bilateral low back pain with bilateral sciatica   [place] [facility] [place], Marsa PARAS, DO   6 months ago Chronic pain of left knee   [place] [place],  Marsa PARAS, DO   7 months ago Chronic pain of left knee   [place] [facility] [place], Marsa PARAS, DO              Passed - CBC within normal limits and completed in the last 12 months    WBC  Date Value Ref Range Status  01/08/2024 7.0 3.8 - 10.8 Thousand/uL Final   RBC  Date Value Ref Range Status  01/08/2024 4.07 (L) 4.20 - 5.80 Million/uL Final   Hemoglobin  Date Value Ref Range Status  01/08/2024 13.5 13.2 - 17.1 g/dL Final   HCT  Date Value Ref Range Status  01/08/2024 40.9 38.5 - 50.0 % Final   MCHC  Date Value Ref Range Status  01/08/2024 33.0 32.0 - 36.0 g/dL Final    Comment:    For adults, a slight decrease in the calculated MCHC value (in the range of 30 to 32 g/dL) is most likely not clinically significant; however, it should be interpreted with caution in correlation with other red cell parameters and the patient's clinical condition.    [facility]  Date Value Ref Range Status  01/08/2024 33.2 (H) 27.0 - 33.0 pg Final   MCV  Date Value Ref Range Status  01/08/2024 100.5 (H) 80.0 - 100.0 fL Final   No results found for: PLTCOUNTKUC, LABPLAT, POCPLA RDW  Date Value Ref Range Status  01/08/2024 14.2 11.0 - 15.0 % Final          amLODipine  (NORVASC ) 10 MG tablet 30 tablet 2    Sig: TAKE 1 TABLET BY MOUTH ONCE DAILY     Cardiovascular: Calcium  Channel Blockers 2 Passed - 09/03/2024  4:28 PM      Passed - Last BP in normal range    BP Readings from Last 1 Encounters:  09/01/24 129/89         Passed - Last Heart Rate in normal range    Pulse Readings from Last 1 Encounters:  09/01/24 90         Passed - Valid encounter within last 6 months    Recent Outpatient Visits           2 months ago Type 2 diabetes mellitus with vascular disease [facility])   [place] [facility] [place], Marsa PARAS, DO   3 months ago 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 [place], Marsa PARAS, [place]

## 2024-09-07 ENCOUNTER — Ambulatory Visit: Admission: RE | Admit: 2024-09-07 | Discharge: 2024-09-07 | Disposition: A | Source: Ambulatory Visit

## 2024-09-07 DIAGNOSIS — G8929 Other chronic pain: Secondary | ICD-10-CM

## 2024-09-08 ENCOUNTER — Other Ambulatory Visit

## 2024-09-09 ENCOUNTER — Ambulatory Visit (INDEPENDENT_AMBULATORY_CARE_PROVIDER_SITE_OTHER)

## 2024-09-09 DIAGNOSIS — M75121 Complete rotator cuff tear or rupture of right shoulder, not specified as traumatic: Secondary | ICD-10-CM
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
## 2024-09-09 NOTE — Patient Instructions (Signed)

## 2024-09-12 ENCOUNTER — Other Ambulatory Visit

## 2024-09-15 ENCOUNTER — Other Ambulatory Visit: Payer: Self-pay | Admitting: Family Medicine

## 2024-09-15 DIAGNOSIS — G8929 Other chronic pain: Secondary | ICD-10-CM

## 2024-09-15 DIAGNOSIS — M5030 Other cervical disc degeneration, unspecified cervical region: Secondary | ICD-10-CM

## 2024-09-15 DIAGNOSIS — M1712 Unilateral primary osteoarthritis, left knee: Secondary | ICD-10-CM

## 2024-09-15 NOTE — Telephone Encounter (Signed)
 Requested medications are due for refill today.  2 days early  Requested medications are on the active medications list.  yes  Last refill. 09/07/2024 #[phone] rf  Future visit scheduled.   no  Notes to clinic.  Refill not delegated.     Requested Prescriptions  Pending Prescriptions Disp Refills   HYDROcodone -acetaminophen  (NORCO) 10-325 MG tablet [Pharmacy Med Name: HYDROCODONE -APAP 10-325 MG TAB] 56 tablet     Sig: TAKE 1 TABLET BY MOUTH EVERY 4 HOURS AS NEEDED     Not Delegated - Analgesics:  Opioid Agonist Combinations Failed - 09/15/2024  5:08 PM      Failed - This refill cannot be delegated      Failed - Urine Drug Screen completed in last 360 days      Passed - Valid encounter within last 3 months    Recent Outpatient Visits           2 months ago Type 2 diabetes mellitus with vascular disease [facility])   [place] [facility] 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 Edman Marsa PARAS, DO   7 months ago Chronic pain of left knee   [facility] [place], Marsa PARAS, [place]

## 2024-09-16 ENCOUNTER — Telehealth: Payer: Self-pay | Admitting: Neurosurgery

## 2024-09-16 ENCOUNTER — Telehealth: Payer: Self-pay | Admitting: Family Medicine

## 2024-09-16 DIAGNOSIS — M5030 Other cervical disc degeneration, unspecified cervical region: Secondary | ICD-10-CM

## 2024-09-16 DIAGNOSIS — G8929 Other chronic pain: Secondary | ICD-10-CM

## 2024-09-16 DIAGNOSIS — M1712 Unilateral primary osteoarthritis, left knee: Secondary | ICD-10-CM

## 2024-09-16 NOTE — Telephone Encounter (Signed)
 Copied from CRM #[id number]. Topic: Clinical - Medication Refill >> Sep 16, 2024 10:27 AM Larissa RAMAN wrote: Medication: HYDROcodone -acetaminophen  (NORCO) 10-325 MG tablet  Has the patient contacted their pharmacy? Yes (Agent: If no, request that the patient contact the pharmacy for the refill. If patient does not wish to contact the pharmacy document the reason why and proceed with request.) (Agent: If yes, when and what did the pharmacy advise?)  This is the patient's preferred pharmacy:  [facility] - [place], [place] - [address] [place] [address] Phone: [phone] Fax: [phone]  Is this the correct pharmacy for this prescription? Yes If no, delete pharmacy and type the correct one.   Has the prescription been filled recently? No  Is the patient out of the medication? Yes  Has the patient been seen for an appointment in the last year OR does the patient have an upcoming appointment? Yes  Can we respond through MyChart? No  Agent: Please be advised that Rx refills may take up to 3 business days. We ask that you follow-up with your pharmacy.

## 2024-09-16 NOTE — Telephone Encounter (Signed)
 Spoke with patient, notified he can pick up his prescription on 1/23

## 2024-09-16 NOTE — Telephone Encounter (Signed)
 Patient left a voicemail that he is in a lot of pain and can barely walk. He states he would like to proceed with surgery.

## 2024-09-16 NOTE — Telephone Encounter (Signed)
 Called patient regarding his reported worsening symptoms.   Patient states that he is now having symptoms on both sides of his body. States he is having pain down both sides of his back into both legs. He complains of weakness and numness/tingling in the back of both thighs. He states he was having these same symptoms previously, but only on the left side. Symptoms are now moving into the right side.   Patient states that he is able to walk. Patient states that 3 hours after getting up for the day, he is limping the rest of the day due to pain.   Patient was referred to go to PT at [facility], but has been unable to set up an appointment with them for one reason or another. He contributes this to making someone mad at their office and believes this is why he has been unable to get an appointment with them. He states he does not think he could participate in PT due to his pain at this point, but would be willing to try if it is the only way that insurance will pay for surgery.   States that he has had issues with fecal incontinence for a couple years. Also endorses occasionally experiencing increased frequency and difficulty emptying bladder.   Has had ESI's, but states that they only lasted 2-3 days.  Offered appointment with PA 09/17/24 at 9am, patient declined. Scheduled with PA on 09/24/24.

## 2024-09-16 NOTE — Telephone Encounter (Signed)
 Please call him to notify him I signed his refill request. It looks like 14 days from last fill will be this weekend. I sent it for Friday 09/18/24. He can check with pharmacy for pick up.  Thank you!  Marsa Officer, DO [facility] 09/16/2024, 12:39 PM

## 2024-09-24 ENCOUNTER — Ambulatory Visit: Admitting: Physician Assistant

## 2024-09-25 ENCOUNTER — Other Ambulatory Visit: Payer: Self-pay | Admitting: Family Medicine

## 2024-09-25 DIAGNOSIS — M1712 Unilateral primary osteoarthritis, left knee: Secondary | ICD-10-CM

## 2024-09-25 DIAGNOSIS — M5030 Other cervical disc degeneration, unspecified cervical region: Secondary | ICD-10-CM

## 2024-09-25 DIAGNOSIS — G8929 Other chronic pain: Secondary | ICD-10-CM

## 2024-09-28 ENCOUNTER — Telehealth: Payer: Self-pay

## 2024-09-28 NOTE — Telephone Encounter (Signed)
 Requested medication (s) are due for refill today - provider review   Requested medication (s) are on the active medication list -yes  Future visit scheduled -no  Last refill: 09/18/24 #56  Notes to clinic: non delegated Rx  Requested Prescriptions  Pending Prescriptions Disp Refills   HYDROcodone -acetaminophen  (NORCO) 10-325 MG tablet [Pharmacy Med Name: HYDROCODONE -APAP 10-325 MG TAB] 56 tablet     Sig: TAKE 1 TABLET BY MOUTH EVERY 4 HOURS AS NEEDED     Not Delegated - Analgesics:  Opioid Agonist Combinations Failed - 09/28/2024 12:10 PM      Failed - This refill cannot be delegated      Failed - Urine Drug Screen completed in last 360 days      Passed - Valid encounter within last 3 months    Recent Outpatient Visits           2 months ago Type 2 diabetes mellitus with vascular disease (HCC)   [place] [facility] [place], Marsa PARAS, DO   4 months ago 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[facility])   [place] [facility] [place], Marsa PARAS, DO   5 months ago Chronic bilateral low back pain with bilateral sciatica   [facility] [place], Marsa PARAS, DO   6 months ago Chronic pain of left knee   [place] [facility] [place], Marsa PARAS, DO   8 months ago Chronic pain of left knee   [place] [facility] [place] Marsa PARAS, DO                 Requested Prescriptions  Pending Prescriptions Disp Refills   HYDROcodone -acetaminophen  (NORCO) 10-325 MG tablet [Pharmacy Med Name: HYDROCODONE -APAP 10-325 MG TAB] 56 tablet     Sig: TAKE 1 TABLET BY MOUTH EVERY 4 HOURS AS NEEDED     Not Delegated - Analgesics:  Opioid Agonist Combinations Failed - 09/28/2024 12:10 PM      Failed - This refill cannot be delegated      Failed - Urine Drug Screen completed in last 360 days      Passed - Valid encounter within last 3 months    Recent Outpatient Visits           2  months ago Type 2 diabetes mellitus with vascular disease [facility])   [place] [facility] [place], Marsa PARAS, DO   4 months ago 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 [place] Marsa PARAS, DO   8 months ago Chronic pain of left knee   [facility] [place], Marsa PARAS, [place]

## 2024-09-28 NOTE — Telephone Encounter (Signed)
 Copied from CRM #[id number]. Topic: Clinical - Medication Question >> Sep 28, 2024 11:17 AM Montie POUR wrote: Reason for CRM:  Mr. Breeding is calling about the refill for HYDROcodone -acetaminophen  (NORCO) 10-325 MG tablet. He will be out today.  Charts shows: 09/25/24 1218 Reorder Interface, Surescripts Out To Nmizm:[id number] And this: Sent to pharmacy as: HYDROcodone -acetaminophen  (NORCO) 10-325 MG tablet  Earliest Fill Date: 09/18/2024  Notes to Pharmacy: First fill 09/18/2024  E-Prescribing Status: Receipt confirmed by pharmacy (09/16/2024 12:28 PM EST)   Please check on refill.

## 2024-10-22 ENCOUNTER — Ambulatory Visit: Admitting: Orthopedic Surgery
